# Patient Record
Sex: Female | Born: 1950
Health system: Southern US, Community
[De-identification: ages and names within clinical notes are randomized; demographics above are authoritative.]

## PROBLEM LIST (undated history)

## (undated) DIAGNOSIS — I509 Heart failure, unspecified: Secondary | ICD-10-CM

## (undated) DIAGNOSIS — F419 Anxiety disorder, unspecified: Secondary | ICD-10-CM

## (undated) DIAGNOSIS — T783XXA Angioneurotic edema, initial encounter: Secondary | ICD-10-CM

## (undated) DIAGNOSIS — H269 Unspecified cataract: Secondary | ICD-10-CM

## (undated) DIAGNOSIS — K219 Gastro-esophageal reflux disease without esophagitis: Secondary | ICD-10-CM

## (undated) DIAGNOSIS — I10 Essential (primary) hypertension: Secondary | ICD-10-CM

## (undated) DIAGNOSIS — E039 Hypothyroidism, unspecified: Secondary | ICD-10-CM

## (undated) DIAGNOSIS — E538 Deficiency of other specified B group vitamins: Secondary | ICD-10-CM

## (undated) DIAGNOSIS — I471 Supraventricular tachycardia, unspecified: Secondary | ICD-10-CM

## (undated) DIAGNOSIS — E785 Hyperlipidemia, unspecified: Secondary | ICD-10-CM

## (undated) DIAGNOSIS — R32 Unspecified urinary incontinence: Secondary | ICD-10-CM

## (undated) DIAGNOSIS — I5032 Chronic diastolic (congestive) heart failure: Secondary | ICD-10-CM

## (undated) HISTORY — DX: Essential (primary) hypertension: I10

## (undated) HISTORY — PX: VEIN LIGATION AND STRIPPING: SHX2653

## (undated) HISTORY — PX: LEEP: SHX91

## (undated) HISTORY — DX: Hypothyroidism, unspecified: E03.9

## (undated) HISTORY — DX: Hyperlipidemia, unspecified: E78.5

## (undated) HISTORY — DX: Unspecified cataract: H26.9

## (undated) HISTORY — PX: TUBAL LIGATION: SHX77

## (undated) HISTORY — DX: Gastro-esophageal reflux disease without esophagitis: K21.9

## (undated) HISTORY — PX: COLONOSCOPY: SHX174

## (undated) HISTORY — DX: Anxiety disorder, unspecified: F41.9

## (undated) HISTORY — DX: Angioneurotic edema, initial encounter: T78.3XXA

---

## 1997-09-18 ENCOUNTER — Other Ambulatory Visit: Admission: RE | Admit: 1997-09-18 | Discharge: 1997-09-18 | Payer: Self-pay | Admitting: Obstetrics and Gynecology

## 1997-11-27 ENCOUNTER — Other Ambulatory Visit: Admission: RE | Admit: 1997-11-27 | Discharge: 1997-11-27 | Payer: Self-pay | Admitting: *Deleted

## 1998-03-04 ENCOUNTER — Other Ambulatory Visit: Admission: RE | Admit: 1998-03-04 | Discharge: 1998-03-04 | Payer: Self-pay | Admitting: Obstetrics and Gynecology

## 1998-09-22 ENCOUNTER — Other Ambulatory Visit: Admission: RE | Admit: 1998-09-22 | Discharge: 1998-09-22 | Payer: Self-pay | Admitting: Obstetrics and Gynecology

## 1999-02-25 ENCOUNTER — Other Ambulatory Visit: Admission: RE | Admit: 1999-02-25 | Discharge: 1999-02-25 | Payer: Self-pay | Admitting: *Deleted

## 1999-03-24 ENCOUNTER — Other Ambulatory Visit: Admission: RE | Admit: 1999-03-24 | Discharge: 1999-03-24 | Payer: Self-pay | Admitting: *Deleted

## 1999-09-29 ENCOUNTER — Other Ambulatory Visit: Admission: RE | Admit: 1999-09-29 | Discharge: 1999-09-29 | Payer: Self-pay | Admitting: *Deleted

## 2000-04-04 ENCOUNTER — Other Ambulatory Visit: Admission: RE | Admit: 2000-04-04 | Discharge: 2000-04-04 | Payer: Self-pay | Admitting: *Deleted

## 2000-10-02 ENCOUNTER — Other Ambulatory Visit: Admission: RE | Admit: 2000-10-02 | Discharge: 2000-10-02 | Payer: Self-pay | Admitting: *Deleted

## 2001-02-13 ENCOUNTER — Ambulatory Visit (HOSPITAL_COMMUNITY): Admission: RE | Admit: 2001-02-13 | Discharge: 2001-02-13 | Payer: Self-pay | Admitting: Gastroenterology

## 2001-10-08 ENCOUNTER — Other Ambulatory Visit: Admission: RE | Admit: 2001-10-08 | Discharge: 2001-10-08 | Payer: Self-pay | Admitting: Obstetrics and Gynecology

## 2004-05-16 ENCOUNTER — Ambulatory Visit: Payer: Self-pay | Admitting: Family Medicine

## 2004-05-18 ENCOUNTER — Ambulatory Visit: Payer: Self-pay | Admitting: Family Medicine

## 2004-07-06 ENCOUNTER — Ambulatory Visit: Payer: Self-pay | Admitting: Family Medicine

## 2004-08-24 ENCOUNTER — Ambulatory Visit: Payer: Self-pay | Admitting: Family Medicine

## 2004-08-26 ENCOUNTER — Ambulatory Visit: Payer: Self-pay | Admitting: Family Medicine

## 2004-10-11 ENCOUNTER — Ambulatory Visit: Payer: Self-pay | Admitting: Family Medicine

## 2005-02-02 ENCOUNTER — Ambulatory Visit: Payer: Self-pay | Admitting: Family Medicine

## 2005-03-21 ENCOUNTER — Ambulatory Visit: Payer: Self-pay | Admitting: Family Medicine

## 2005-04-04 ENCOUNTER — Ambulatory Visit: Payer: Self-pay | Admitting: Family Medicine

## 2005-11-22 LAB — HM MAMMOGRAPHY

## 2005-12-14 ENCOUNTER — Ambulatory Visit: Payer: Self-pay | Admitting: Family Medicine

## 2006-02-22 ENCOUNTER — Encounter: Payer: Self-pay | Admitting: Family Medicine

## 2006-03-27 ENCOUNTER — Ambulatory Visit: Payer: Self-pay | Admitting: Family Medicine

## 2006-05-09 ENCOUNTER — Ambulatory Visit: Payer: Self-pay | Admitting: Family Medicine

## 2006-05-09 LAB — CONVERTED CEMR LAB
ALT: 16 units/L (ref 0–40)
AST: 28 units/L (ref 0–37)
BUN: 14 mg/dL (ref 6–23)
Calcium: 9.6 mg/dL (ref 8.4–10.5)
Chloride: 103 meq/L (ref 96–112)
Direct LDL: 63.9 mg/dL
GFR calc Af Amer: 74 mL/min
GFR calc non Af Amer: 61 mL/min
Glucose, Bld: 91 mg/dL (ref 70–99)
HDL: 64.3 mg/dL (ref >39.0)
Potassium: 4 meq/L (ref 3.5–5.1)
VLDL: 82 mg/dL — ABNORMAL HIGH (ref 0–40)

## 2006-06-05 ENCOUNTER — Ambulatory Visit: Payer: Self-pay | Admitting: Family Medicine

## 2006-06-05 LAB — CONVERTED CEMR LAB
ALT: 12 units/L (ref 0–40)
AST: 27 units/L (ref 0–37)
Direct LDL: 62 mg/dL
Triglycerides: 287 mg/dL (ref 0–149)

## 2006-08-15 ENCOUNTER — Ambulatory Visit: Payer: Self-pay | Admitting: Family Medicine

## 2006-08-15 LAB — CONVERTED CEMR LAB
Basophils Absolute: 0 10*3/uL (ref 0.0–0.1)
Basophils Relative: 0.5 % (ref 0.0–1.0)
HCT: 39.8 % (ref 36.0–46.0)
Hemoglobin: 13.8 g/dL (ref 12.0–15.0)
Monocytes Absolute: 0.5 10*3/uL (ref 0.2–0.7)
Neutrophils Relative %: 57.9 % (ref 43.0–77.0)
RBC: 4.11 M/uL (ref 3.87–5.11)
RDW: 12.3 % (ref 11.5–14.6)
WBC: 6.5 10*3/uL (ref 4.5–10.5)

## 2006-11-07 ENCOUNTER — Telehealth: Payer: Self-pay | Admitting: Family Medicine

## 2006-11-16 ENCOUNTER — Telehealth: Payer: Self-pay | Admitting: Family Medicine

## 2007-01-31 ENCOUNTER — Ambulatory Visit: Payer: Self-pay | Admitting: Family Medicine

## 2007-01-31 DIAGNOSIS — E785 Hyperlipidemia, unspecified: Secondary | ICD-10-CM | POA: Insufficient documentation

## 2007-01-31 DIAGNOSIS — I10 Essential (primary) hypertension: Secondary | ICD-10-CM | POA: Insufficient documentation

## 2007-01-31 DIAGNOSIS — E039 Hypothyroidism, unspecified: Secondary | ICD-10-CM | POA: Insufficient documentation

## 2007-01-31 DIAGNOSIS — F411 Generalized anxiety disorder: Secondary | ICD-10-CM | POA: Insufficient documentation

## 2007-01-31 DIAGNOSIS — K219 Gastro-esophageal reflux disease without esophagitis: Secondary | ICD-10-CM

## 2007-01-31 LAB — CONVERTED CEMR LAB
Glucose, Urine, Semiquant: NEGATIVE
Ketones, urine, test strip: NEGATIVE
Nitrite: NEGATIVE
Protein, U semiquant: 30
Specific Gravity, Urine: 1.02
pH: 6

## 2007-07-26 ENCOUNTER — Telehealth: Payer: Self-pay | Admitting: Family Medicine

## 2007-08-12 ENCOUNTER — Telehealth: Payer: Self-pay | Admitting: Family Medicine

## 2007-08-19 ENCOUNTER — Ambulatory Visit: Payer: Self-pay | Admitting: Family Medicine

## 2007-08-19 DIAGNOSIS — M2550 Pain in unspecified joint: Secondary | ICD-10-CM | POA: Insufficient documentation

## 2007-08-20 ENCOUNTER — Encounter: Payer: Self-pay | Admitting: Family Medicine

## 2007-08-20 LAB — CONVERTED CEMR LAB
ALT: 11 units/L (ref 0–35)
AST: 21 units/L (ref 0–37)
Basophils Absolute: 0 10*3/uL (ref 0.0–0.1)
Cholesterol: 406 mg/dL (ref 0–200)
HCT: 38.1 % (ref 36.0–46.0)
Hemoglobin: 13 g/dL (ref 12.0–15.0)
MCHC: 34.2 g/dL (ref 30.0–36.0)
Monocytes Absolute: 0.4 10*3/uL (ref 0.1–1.0)
Neutro Abs: 2.7 10*3/uL (ref 1.4–7.7)
Platelets: 233 10*3/uL (ref 150–400)
RDW: 12.9 % (ref 11.5–14.6)
Total CHOL/HDL Ratio: 9.6
Triglycerides: 394 mg/dL (ref 0–149)
VLDL: 79 mg/dL — ABNORMAL HIGH (ref 0–40)

## 2007-09-02 ENCOUNTER — Encounter: Payer: Self-pay | Admitting: Family Medicine

## 2007-09-26 ENCOUNTER — Ambulatory Visit: Payer: Self-pay | Admitting: Family Medicine

## 2007-09-30 LAB — CONVERTED CEMR LAB
ALT: 14 units/L (ref 0–35)
AST: 22 units/L (ref 0–37)
HDL: 44.4 mg/dL (ref >39.0)
Total CHOL/HDL Ratio: 6.7
Triglycerides: 320 mg/dL (ref 0–149)
VLDL: 64 mg/dL — ABNORMAL HIGH (ref 0–40)

## 2007-10-29 ENCOUNTER — Encounter: Payer: Self-pay | Admitting: Family Medicine

## 2007-10-31 ENCOUNTER — Encounter: Payer: Self-pay | Admitting: Family Medicine

## 2007-11-20 ENCOUNTER — Encounter: Payer: Self-pay | Admitting: Family Medicine

## 2007-11-29 ENCOUNTER — Ambulatory Visit: Payer: Self-pay | Admitting: Family Medicine

## 2007-12-03 LAB — CONVERTED CEMR LAB
ALT: 16 units/L (ref 0–35)
AST: 23 units/L (ref 0–37)
Direct LDL: 118 mg/dL
Total CHOL/HDL Ratio: 6.8

## 2007-12-09 ENCOUNTER — Telehealth (INDEPENDENT_AMBULATORY_CARE_PROVIDER_SITE_OTHER): Payer: Self-pay | Admitting: *Deleted

## 2007-12-17 ENCOUNTER — Telehealth (INDEPENDENT_AMBULATORY_CARE_PROVIDER_SITE_OTHER): Payer: Self-pay | Admitting: *Deleted

## 2008-01-16 ENCOUNTER — Telehealth (INDEPENDENT_AMBULATORY_CARE_PROVIDER_SITE_OTHER): Payer: Self-pay | Admitting: *Deleted

## 2008-03-02 ENCOUNTER — Ambulatory Visit: Payer: Self-pay | Admitting: Family Medicine

## 2008-03-03 LAB — CONVERTED CEMR LAB
ALT: 15 units/L (ref 0–35)
BUN: 10 mg/dL (ref 6–23)
CO2: 31 meq/L (ref 19–32)
Chloride: 102 meq/L (ref 96–112)
Cholesterol: 218 mg/dL (ref 0–200)
Creatinine, Ser: 1 mg/dL (ref 0.4–1.2)
Direct LDL: 81.1 mg/dL
GFR calc Af Amer: 73 mL/min
Glucose, Bld: 93 mg/dL (ref 70–99)
TSH: 3.41 microintl units/mL (ref 0.35–5.50)
Total CHOL/HDL Ratio: 5.2
Triglycerides: 209 mg/dL (ref 0–149)

## 2008-05-19 ENCOUNTER — Telehealth: Payer: Self-pay | Admitting: Family Medicine

## 2009-01-26 ENCOUNTER — Telehealth: Payer: Self-pay | Admitting: Family Medicine

## 2009-03-05 ENCOUNTER — Ambulatory Visit: Payer: Self-pay | Admitting: Family Medicine

## 2009-03-08 LAB — CONVERTED CEMR LAB
ALT: 13 units/L (ref 0–35)
AST: 26 units/L (ref 0–37)
Direct LDL: 95 mg/dL
Triglycerides: 268 mg/dL — ABNORMAL HIGH (ref 0.0–149.0)

## 2009-03-17 ENCOUNTER — Telehealth: Payer: Self-pay | Admitting: Family Medicine

## 2009-04-01 ENCOUNTER — Telehealth: Payer: Self-pay | Admitting: Family Medicine

## 2009-04-28 ENCOUNTER — Telehealth: Payer: Self-pay | Admitting: Family Medicine

## 2009-05-05 ENCOUNTER — Ambulatory Visit: Payer: Self-pay | Admitting: Family Medicine

## 2010-01-05 ENCOUNTER — Telehealth: Payer: Self-pay | Admitting: Family Medicine

## 2010-05-24 NOTE — Progress Notes (Signed)
Summary: Rx's to be mailed away  Phone Note Refill Request Message from:  Patient on April 28, 2009 11:38 AM  Refills Requested: Medication #1:  PROTONIX 40 MG  TBEC 1 by mouth daily  Medication #2:  COZAAR 100 MG  TABS 1 by mouth daily pt has appt scheduled on 05/05/2009 and is asking for 90 day supply to be mailed away.    Method Requested: Pick up at Office Initial call taken by: Mervin Hack CMA Duncan Dull),  April 28, 2009 11:39 AM  Follow-up for Phone Call        printed in put in nurse in box for pickup  Follow-up by: [name] MD,  April 28, 2009 1:07 PM  Additional Follow-up for Phone Call Additional follow up Details #1::        Pt to pick up. Additional Follow-up by: Lowella Petties CMA,  April 28, 2009 2:47 PM    Prescriptions: COZAAR 100 MG  TABS (LOSARTAN POTASSIUM) 1 by mouth daily  #90 x 1   Entered and Authorized by:   [name] MD   Signed by:   [name] MD on 04/28/2009   Method used:   Print then Give to Patient   RxID:   [id number] PROTONIX 40 MG  TBEC (PANTOPRAZOLE SODIUM) 1 by mouth daily  #90 x 1   Entered and Authorized by:   [name] MD   Signed by:   [name] MD on 04/28/2009   Method used:   Print then Give to Patient   RxID:   [id number]

## 2010-05-24 NOTE — Assessment & Plan Note (Signed)
Summary: FOLLOW UP   Vital Signs:  Patient profile:   60 year old female Weight:      203 pounds Temp:     98 degrees F oral Pulse rate:   80 / minute Pulse rhythm:   regular BP sitting:   124 / 78  (left arm) Cuff size:   regular  Vitals Entered By: Lowella Petties CMA (May 05, 2009 11:18 AM) CC: follow-up visit   History of Present Illness: here for f/u of hyperlipidemia and hypothyroidism  has been doing fine in general   has been to ortho and smoc-- saw Dr Jon Billings unsure if she has fibromyalgia -- but no problems since getting off lipitor (thankful for that)  thinks she has a little asthma  something in her MILs house made her wheeze and sob -- ? what chemical aggrivated it    no recent change in med   did change to zocor due to myalgias with lipitor   recent chol up a bit with trig 268/ HDL47 and LDL 95  is watching diet - no fried food and has cut way back on red meat  is struggling with weight gain (very stressful situation- lost M and MIL)- no time for herself  [facility]es [facility] -- and enjoys that - every other day / walks on the treadmill/ will start back with weight   tsh is stable and theraputic  pt has gained 17 lb since last visit   anxiety - is no problem now   bp is in good control with 124/78 today- no problems or headaches   is being treated for vit D def by her gyn -- 15 - high dose weekly for a while now on citrucal with vit D  has had dexa in the past -- osteopenia first one - then improved    Allergies: 1)  ! Lipitor (Atorvastatin) 2)  Neosporin 3)  Norvasc  Past History:  Family History: Last updated: 02/14/2007 Father: deceased age 87- cerebral thrombosis secondary to arterosclerosis, HTN Mother: TIA's, HTN Siblings:   Social History: Last updated: 02-14-2007 Marital Status: Married Children: 2 Occupation: realtor  Risk Factors: Smoking Status: quit (02/14/2007)  Past Medical  History: Anxiety GERD Hyperlipidemia Hypertension Hypothyroidism vit D def   myalgias/arthralgias -- ? fibromyalgia (but symptoms resolved off of lipitor)   rheum- Devishwar  Past Surgical History: Vein stripping x 2 AB Tubal ligation Colonoscopy- diverticulosis (2002) LEEP Dexa- osteopenia (2007)-- f/u one was normal   Review of Systems General:  Complains of fatigue; denies fever, loss of appetite, and malaise. Eyes:  Denies blurring and eye irritation. CV:  Denies chest pain or discomfort, lightheadness, palpitations, and shortness of breath with exertion. Resp:  Denies cough and wheezing. GI:  Denies abdominal pain, change in bowel habits, and indigestion. MS:  Denies joint pain, joint redness, and joint swelling. Derm:  Denies lesion(s), poor wound healing, and rash. Neuro:  Denies numbness, tingling, and tremors. Psych:  mood is much better . Endo:  Denies cold intolerance, excessive thirst, excessive urination, and heat intolerance.  Physical Exam  General:  overweight but generally well appearing  Head:  normocephalic, atraumatic, and no abnormalities observed.   Eyes:  vision grossly intact, pupils equal, pupils round, and pupils reactive to light.   Mouth:  pharynx pink and moist.   Neck:  supple with full rom and no masses or thyromegally, no JVD or carotid bruit  Lungs:  Normal respiratory effort, chest expands symmetrically. Lungs are clear to  auscultation, no crackles or wheezes. Heart:  Normal rate and regular rhythm. S1 and S2 normal without gallop, murmur, click, rub or other extra sounds. Abdomen:  soft and non-tender.  no renal  bruits  Msk:  No deformity or scoliosis noted of thoracic or lumbar spine.  no acute joint changes or trigger points  Pulses:  R and L carotid,radial,femoral,dorsalis pedis and posterior tibial pulses are full and equal bilaterally Extremities:  no CCE today Neurologic:  sensation intact to light touch, gait normal, and DTRs  symmetrical and normal.  no tremor Skin:  Intact without suspicious lesions or rashes Cervical Nodes:  No lymphadenopathy noted Psych:  normal affect, talkative and pleasant    Impression & Recommendations:  Problem # 1:  HYPOTHYROIDISM (ICD-244.9) Assessment Unchanged  no clinical changes and stable tsh no change in dose - rev lab with pt  Her updated medication list for this problem includes:    Synthroid 50 Mcg Tabs (Levothyroxine sodium) .Marland Kitchen... 1 by mouth daily  Labs Reviewed: TSH: 2.69 (03/05/2009)    Chol: 251 (03/05/2009)   HDL: 47.20 (03/05/2009)   LDL: DEL (03/02/2008)   TG: 268.0 (03/05/2009)  Problem # 2:  HYPERTENSION (ICD-401.9) Assessment: Unchanged  bp remains in very good control with current meds  no change rec  rev healthy diet and exercise - will consider wt watchers for wt loss  urged to keep up the good exercise  Her updated medication list for this problem includes:    Cozaar 100 Mg Tabs (Losartan potassium) .Marland Kitchen... 1 by mouth daily    Hydrochlorothiazide 50 Mg Tabs (Hydrochlorothiazide) .Marland Kitchen... 1 by mouth daily  BP today: 124/78 Prior BP: 110/80 (08/19/2007)  Labs Reviewed: K+: 3.9 (03/02/2008) Creat: : 1.0 (03/02/2008)   Chol: 251 (03/05/2009)   HDL: 47.20 (03/05/2009)   LDL: DEL (03/02/2008)   TG: 268.0 (03/05/2009)  Problem # 3:  HYPERLIPIDEMIA (ICD-272.4) Assessment: Unchanged  cholesterol is up a bit on zocor- but this is much better tolerated disc low sat fat diet in detail  will re check this in 6 mo and f/u in a year  Her updated medication list for this problem includes:    Zocor 20 Mg Tabs (Simvastatin) .Marland Kitchen... Take one by mouth daily  Labs Reviewed: SGOT: 26 (03/05/2009)   SGPT: 13 (03/05/2009)   HDL:47.20 (03/05/2009), 41.6 (03/02/2008)  LDL:DEL (03/02/2008), DEL (11/29/2007)  Chol:251 (03/05/2009), 218 (03/02/2008)  Trig:268.0 (03/05/2009), 209 (03/02/2008)  Problem # 4:  ANXIETY (ICD-300.00) Assessment: Improved overall doing much  better on current meds  some dec in sit stress (is out of real estate buisness0 - but has had some family loss  refil meds enc good exercise  Her updated medication list for this problem includes:    Wellbutrin Xl 150 Mg Tb24 (Bupropion hcl) .Marland Kitchen... 1 by mouth each am    Zoloft 100 Mg Tabs (Sertraline hcl) .Marland Kitchen... Take 2 by mouth daily  Complete Medication List: 1)  Protonix 40 Mg Tbec (Pantoprazole sodium) .Marland Kitchen.. 1 by mouth daily 2)  Wellbutrin Xl 150 Mg Tb24 (Bupropion hcl) .Marland Kitchen.. 1 by mouth each am 3)  Zoloft 100 Mg Tabs (Sertraline hcl) .... Take 2 by mouth daily 4)  Synthroid 50 Mcg Tabs (Levothyroxine sodium) .Marland Kitchen.. 1 by mouth daily 5)  Cozaar 100 Mg Tabs (Losartan potassium) .Marland Kitchen.. 1 by mouth daily 6)  Hydrochlorothiazide 50 Mg Tabs (Hydrochlorothiazide) .Marland Kitchen.. 1 by mouth daily 7)  Zocor 20 Mg Tabs (Simvastatin) .... Take one by mouth daily 8)  Estrace 0.1 Mg/gm Crea (  Estradiol) .... Use 2 times a week 9)  Folic Acid 1 Mg Tabs (Folic acid) .... Take one by mouth daily 10)  Aspirin 81 Mg Tabs (Aspirin) .... Take one by mouth daily 11)  Citracal Plus Tabs (Multiple minerals-vitamins) .... Take by mouth as directed  Patient Instructions: 1)  no change in medicine  2)  schedule fasting labs in 6 months lipid/ast/alt/renal 401.1, 272  3)  follow up with me in about a year  4)  keep working on healthy diet and exercise  Prescriptions: ZOCOR 20 MG  TABS (SIMVASTATIN) take one by mouth daily  #90 x 3   Entered and Authorized by:   [name] MD   Signed by:   [name] MD on 05/05/2009   Method used:   Print then Give to Patient   RxID:   [id number] HYDROCHLOROTHIAZIDE 50 MG  TABS (HYDROCHLOROTHIAZIDE) 1 by mouth daily  #90 x 3   Entered and Authorized by:   [name] MD   Signed by:   [name] MD on 05/05/2009   Method used:   Print then Give to Patient   RxID:   [id number] COZAAR 100 MG  TABS (LOSARTAN POTASSIUM) 1 by mouth daily  #90 x 3   Entered and  Authorized by:   [name] MD   Signed by:   [name] MD on 05/05/2009   Method used:   Print then Give to Patient   RxID:   [id number] SYNTHROID 50 MCG  TABS (LEVOTHYROXINE SODIUM) 1 by mouth daily  #90 x 3   Entered and Authorized by:   [name] MD   Signed by:   [name] MD on 05/05/2009   Method used:   Print then Give to Patient   RxID:   [id number] ZOLOFT 100 MG  TABS (SERTRALINE HCL) take 2 by mouth daily  #180 x 3   Entered and Authorized by:   [name] MD   Signed by:   [name] MD on 05/05/2009   Method used:   Print then Give to Patient   RxID:   [id number] WELLBUTRIN XL 150 MG  TB24 (BUPROPION HCL) 1 by mouth each am  #90 x 3   Entered and Authorized by:   [name] MD   Signed by:   [name] MD on 05/05/2009   Method used:   Print then Give to Patient   RxID:   [id number] PROTONIX 40 MG  TBEC (PANTOPRAZOLE SODIUM) 1 by mouth daily  #90 x 3   Entered and Authorized by:   [name] MD   Signed by:   [name] MD on 05/05/2009   Method used:   Print then Give to Patient   RxID:   [id number]   Prior Medications (reviewed today): PROTONIX 40 MG  TBEC (PANTOPRAZOLE SODIUM) 1 by mouth daily WELLBUTRIN XL 150 MG  TB24 (BUPROPION HCL) 1 by mouth each am ZOLOFT 100 MG  TABS (SERTRALINE HCL) take 2 by mouth daily SYNTHROID 50 MCG  TABS (LEVOTHYROXINE SODIUM) 1 by mouth daily COZAAR 100 MG  TABS (LOSARTAN POTASSIUM) 1 by mouth daily ESTRACE 0.1 MG/GM CREA (ESTRADIOL) use 2 times a week FOLIC ACID 1 MG TABS (FOLIC ACID) take one by mouth daily ASPIRIN 81 MG TABS (ASPIRIN) take one by mouth daily CITRACAL PLUS  TABS (MULTIPLE MINERALS-VITAMINS) take by mouth as directed Current Allergies (reviewed today): ! LIPITOR (  ATORVASTATIN) NEOSPORIN NORVASC

## 2010-05-24 NOTE — Progress Notes (Signed)
Summary: needs written scripts for mail order  Phone Note Call from Patient Call back at [phone]   Caller: Patient Call For: Sharon Part MD Summary of Call: Pt is changing mail order pharmacies and needs 3 month written scripts for pantoprazole, sertraline, wellbutrin, zocor, losartan, hctz, levothyroxine, estrace.  Please call when ready. Initial call taken by: Lowella Petties CMA,  January 05, 2010 12:13 PM  Follow-up for Phone Call        printed in put in nurse in box for pickup  Follow-up by: Sharon Part MD,  January 05, 2010 1:21 PM  Additional Follow-up for Phone Call Additional follow up Details #1::        Patient notified as instructed by telephone. Prescription left at front desk. Lewanda Rife LPN  January 05, 2010 2:30 PM     New/Updated Medications: PROTONIX 40 MG  TBEC (PANTOPRAZOLE SODIUM) 1 by mouth daily WELLBUTRIN XL 150 MG  TB24 (BUPROPION HCL) 1 by mouth each am ZOLOFT 100 MG  TABS (SERTRALINE HCL) take 2 by mouth daily SYNTHROID 50 MCG  TABS (LEVOTHYROXINE SODIUM) 1 by mouth daily HYDROCHLOROTHIAZIDE 50 MG  TABS (HYDROCHLOROTHIAZIDE) 1 by mouth daily ZOCOR 20 MG  TABS (SIMVASTATIN) take one by mouth daily ESTRACE 0.1 MG/GM CREA (ESTRADIOL) use 2 times a week as directed (about 2 cm in applicator) Prescriptions: ESTRACE 0.1 MG/GM CREA (ESTRADIOL) use 2 times a week as directed (about 2 cm in applicator)  #3 months x 3   Entered and Authorized by:   Sharon Part MD   Signed by:   Sharon Part MD on 01/05/2010   Method used:   Print then Give to Patient   RxID:   [id number] ZOCOR 20 MG  TABS (SIMVASTATIN) take one by mouth daily  #90 x 3   Entered and Authorized by:   Sharon Part MD   Signed by:   Sharon Part MD on 01/05/2010   Method used:   Print then Give to Patient   RxID:   [id number] HYDROCHLOROTHIAZIDE 50 MG  TABS (HYDROCHLOROTHIAZIDE) 1 by mouth daily  #90 x 3   Entered and Authorized by:   Sharon Part MD  Signed by:   Sharon Part MD on 01/05/2010   Method used:   Print then Give to Patient   RxID:   [id number] COZAAR 100 MG  TABS (LOSARTAN POTASSIUM) 1 by mouth daily  #90 x 3   Entered and Authorized by:   Sharon Part MD   Signed by:   Sharon Part MD on 01/05/2010   Method used:   Print then Give to Patient   RxID:   [id number] SYNTHROID 50 MCG  TABS (LEVOTHYROXINE SODIUM) 1 by mouth daily  #90 x 3   Entered and Authorized by:   Sharon Part MD   Signed by:   Sharon Part MD on 01/05/2010   Method used:   Print then Give to Patient   RxID:   [id number] ZOLOFT 100 MG  TABS (SERTRALINE HCL) take 2 by mouth daily  #180 x 3   Entered and Authorized by:   Sharon Part MD   Signed by:   Sharon Part MD on 01/05/2010   Method used:   Print then Give to Patient   RxID:   [id number] [facility] XL 150 MG  TB24 (BUPROPION HCL) 1 by mouth each am  #90 x 3   Entered and Authorized by:  Sharon Part MD   Signed by:   Sharon Part MD on 01/05/2010   Method used:   Print then Give to Patient   RxID:   [id number] PROTONIX 40 MG  TBEC (PANTOPRAZOLE SODIUM) 1 by mouth daily  #90 x 3   Entered and Authorized by:   Sharon Part MD   Signed by:   Sharon Part MD on 01/05/2010   Method used:   Print then Give to Patient   RxID:   [id number]

## 2010-09-09 NOTE — Procedures (Signed)
[facility]  Patient:    Sharon [name], Sharon [name] Visit Number: [id number] MRN: [id number]          Service Type: END Location: ENDO Attending Physician:  Charna Elizabeth Dictated by:   Anselmo Rod, M.D. Proc. Date: 02/13/01 Admit Date:  02/13/2001   CC:         Cordelia Pen A. Rosalio Macadamia, M.D.   Procedure Report  DATE OF BIRTH:  1950/12/16.  PROCEDURE:  Colonoscopy.  ENDOSCOPIST:  Anselmo Rod, M.D.  INSTRUMENT USED:  Olympus video colonoscope.  INDICATION FOR PROCEDURE:  Rectal bleeding in a 60 year old white female. Rule out colonic polyps, masses, hemorrhoids, etc.  PREPROCEDURE PREPARATION:  Informed consent was procured from the patient. The patient was fasted for eight hours prior to the procedure and prepped with a bottle of magnesium citrate and a gallon of NuLytely the night prior to the procedure.  The patient was also given 1 g of Ancef for a history of mitral valve prolapse to cover prophylaxis for endocarditis.  PREPROCEDURE PHYSICAL:  VITAL SIGNS:  The patient had stable vital signs.  NECK:  Supple.  CHEST:  Clear to auscultation.  S1, S2 regular.  ABDOMEN:  Soft with normal bowel sounds.  DESCRIPTION OF PROCEDURE:  The patient was placed in the left lateral decubitus position and sedated with 80 mg of Demerol and 8 mg of Versed intravenously.  Once the patient was adequately sedate and maintained on low-flow oxygen and continuous cardiac monitoring, the Olympus video colonoscope was advanced from the rectum to the cecum without difficulty. Except for small internal hemorrhoids seen on retroflexion and a few scattered diverticula, no other abnormalities were seen.  No masses, polyps, erosions, or ulcerations were identified.  IMPRESSION: 1. A healthy-appearing colon except for a few scattered diverticula. 2. Small, nonbleeding internal hemorrhoids seen on retroflexion.  RECOMMENDATIONS: 1. A high-fiber diet has  been recommended for the patient. 2. Outpatient follow-up is advised as need arises. Dictated by:   Anselmo Rod, M.D. Attending Physician:  Charna Elizabeth DD:  02/13/01 TD:  02/15/01 Job: [id number]

## 2010-12-25 ENCOUNTER — Other Ambulatory Visit: Payer: Self-pay | Admitting: Family Medicine

## 2010-12-28 ENCOUNTER — Other Ambulatory Visit (INDEPENDENT_AMBULATORY_CARE_PROVIDER_SITE_OTHER): Payer: BC Managed Care – PPO

## 2010-12-28 ENCOUNTER — Telehealth: Payer: Self-pay | Admitting: Radiology

## 2010-12-28 DIAGNOSIS — E7800 Pure hypercholesterolemia, unspecified: Secondary | ICD-10-CM

## 2010-12-28 DIAGNOSIS — I10 Essential (primary) hypertension: Secondary | ICD-10-CM

## 2010-12-28 DIAGNOSIS — E559 Vitamin D deficiency, unspecified: Secondary | ICD-10-CM | POA: Insufficient documentation

## 2010-12-28 LAB — LIPID PANEL
HDL: 51.3 mg/dL (ref >39.00)
Triglycerides: 361 mg/dL — ABNORMAL HIGH (ref 0.0–149.0)

## 2010-12-28 LAB — AST: AST: 22 U/L (ref 0–37)

## 2010-12-28 LAB — RENAL FUNCTION PANEL
Albumin: 4.3 g/dL (ref 3.5–5.2)
Calcium: 9.7 mg/dL (ref 8.4–10.5)
Chloride: 100 mEq/L (ref 96–112)
Phosphorus: 3.7 mg/dL (ref 2.3–4.6)
Potassium: 3.6 mEq/L (ref 3.5–5.1)

## 2010-12-28 LAB — LDL CHOLESTEROL, DIRECT: Direct LDL: 107.4 mg/dL

## 2010-12-28 NOTE — Telephone Encounter (Signed)
Patient wanted a vit D with today's labs. Her GYN follows, but she wanted it drawn today. Vit D deficiency.Marland Kitchen

## 2010-12-28 NOTE — Telephone Encounter (Signed)
Order done for today

## 2011-01-02 ENCOUNTER — Encounter: Payer: Self-pay | Admitting: Family Medicine

## 2011-01-03 ENCOUNTER — Ambulatory Visit (INDEPENDENT_AMBULATORY_CARE_PROVIDER_SITE_OTHER): Payer: BC Managed Care – PPO | Admitting: Family Medicine

## 2011-01-03 ENCOUNTER — Encounter: Payer: Self-pay | Admitting: Family Medicine

## 2011-01-03 DIAGNOSIS — E559 Vitamin D deficiency, unspecified: Secondary | ICD-10-CM

## 2011-01-03 DIAGNOSIS — E785 Hyperlipidemia, unspecified: Secondary | ICD-10-CM

## 2011-01-03 DIAGNOSIS — E039 Hypothyroidism, unspecified: Secondary | ICD-10-CM

## 2011-01-03 DIAGNOSIS — I10 Essential (primary) hypertension: Secondary | ICD-10-CM

## 2011-01-03 MED ORDER — BUPROPION HCL ER (XL) 150 MG PO TB24: 150.0000 mg | ORAL_TABLET | Freq: Every day | ORAL | Status: DC

## 2011-01-03 MED ORDER — LOSARTAN POTASSIUM 100 MG PO TABS: 100.0000 mg | ORAL_TABLET | Freq: Every day | ORAL | Status: DC

## 2011-01-03 MED ORDER — HYDROCHLOROTHIAZIDE 50 MG PO TABS: 50.0000 mg | ORAL_TABLET | Freq: Every day | ORAL | Status: DC

## 2011-01-03 MED ORDER — SERTRALINE HCL 100 MG PO TABS: 200.0000 mg | ORAL_TABLET | Freq: Every day | ORAL | Status: DC

## 2011-01-03 MED ORDER — PANTOPRAZOLE SODIUM 40 MG PO TBEC: 40.0000 mg | DELAYED_RELEASE_TABLET | Freq: Every day | ORAL | Status: DC

## 2011-01-03 MED ORDER — LEVOTHYROXINE SODIUM 50 MCG PO TABS: 50.0000 ug | ORAL_TABLET | Freq: Every day | ORAL | Status: DC

## 2011-01-03 MED ORDER — SIMVASTATIN 20 MG PO TABS: 20.0000 mg | ORAL_TABLET | Freq: Every day | ORAL | Status: DC

## 2011-01-03 NOTE — Assessment & Plan Note (Signed)
Well controlled with arb and hctz No changes  refils done Disc plan to exercise 5 d per week

## 2011-01-03 NOTE — Patient Instructions (Signed)
Avoid red meat/ fried foods/ egg yolks/ fatty breakfast meats/ butter, cheese and high fat dairy/ and shellfish   Aim for exercise 5 days per week at least 2-30 minutes  Schedule fasting lab and then follow up and then follow up  Take your citrical twice daily

## 2011-01-03 NOTE — Assessment & Plan Note (Signed)
No clinical changes  tsh next draw

## 2011-01-03 NOTE — Assessment & Plan Note (Signed)
This is worse with higher sat fat diet and wt gain  Disc low sat fat diet in detail Rev labs with pt  Will work harder If not imp in 3 mo - may need to change med from zocor to another statin

## 2011-01-03 NOTE — Assessment & Plan Note (Signed)
Pt had to have high dose tx from her gyn in past Level in 30s Recommended inc citrical to bid  Will continue to monitor Also disc imp of exercise to bone health

## 2011-01-03 NOTE — Progress Notes (Signed)
Subjective:    Patient ID: Sharon [name], female    DOB: 1951-01-04, 60 y.o.   MRN: [id number]  HPI Here for f/u of HTN and lipids and hypothyroid and vit D def Is doing fine   Gets a bit dizzy when she turns over in bed  Not bad and not during the day at all  Is brief - a bit of vertigo No headaches/numbness/ or vision change   L ankle stays more swollen posteriorly than the other  ? If she ever had an injury there    HTN in good control 110/74 No cp or sob or edema  On cozaar and hct  Labs rev  Lipids are up - esp in trig Lab Results  Component Value Date   CHOL 291* 12/28/2010   CHOL 251* 03/05/2009   CHOL 218* 03/02/2008   Lab Results  Component Value Date   HDL 51.30 12/28/2010   HDL 47.20 03/05/2009   HDL 41.6 03/02/2008   Lab Results  Component Value Date   LDLCALC 86 05/09/2006   Lab Results  Component Value Date   TRIG 361.0* 12/28/2010   TRIG 268.0* 03/05/2009   TRIG 209* 03/02/2008   Lab Results  Component Value Date   CHOLHDL 6 12/28/2010   CHOLHDL 5 03/05/2009   CHOLHDL 5.2 CALC 03/02/2008   Lab Results  Component Value Date   LDLDIRECT 107.4 12/28/2010   LDLDIRECT 95.0 03/05/2009   LDLDIRECT 81.1 03/02/2008     Diet-- not great , not eating well at all  Too many servings of ice cream Gave up cheeseburgers  No eggs and no fried foods   Is getting some exercise - [facility]    Hypothyroid Lab Results  Component Value Date   TSH 2.69 03/05/2009   no change in skin or hair or energy level  Feels about the same  Vit D level is 35 Is taking some calcium and D for bones - citrical  Her gyn follows that as well    Wt is up 5 lb with bmi of 31   Patient Active Problem List  Diagnoses  . HYPOTHYROIDISM  . HYPERLIPIDEMIA  . ANXIETY  . HYPERTENSION  . GERD  . ARTHRALGIA  . Vitamin D deficiency   Past Medical History  Diagnosis Date  . Anxiety   . GERD (gastroesophageal reflux disease)   . Hyperlipidemia    . Hypertension   . Hypothyroidism   . Vitamin D deficiency    Past Surgical History  Procedure Date  . Vein ligation and stripping     x2  . Tubal ligation   . Leep    History  Substance Use Topics  . Smoking status: Former Smoker    Quit date: 04/24/1996  . Smokeless tobacco: Not on file  . Alcohol Use: Not on file   Family History  Problem Relation Age of Onset  . Transient ischemic attack Mother   . Hypertension Mother   . Hypertension Father    Allergies  Allergen Reactions  . Amlodipine Besylate     REACTION: edema  . Atorvastatin     REACTION: muscle pain  . Triple Antibiotic     REACTION: rash   Current Outpatient Prescriptions on File Prior to Visit  Medication Sig Dispense Refill  . estradiol (ESTRACE) 0.1 MG/GM vaginal cream Use 2 times a week as directed (about 2 cm in applicator).       . Multiple Minerals-Vitamins (  CITRACAL PLUS PO) Take 1 tablet by mouth daily.       Marland Kitchen aspirin 81 MG tablet Take 81 mg by mouth daily.        . folic acid (FOLVITE) 1 MG tablet Take 1 mg by mouth daily.              Review of Systems Review of Systems  Constitutional: Negative for fever, appetite change, fatigue and unexpected weight change.  Eyes: Negative for pain and visual disturbance.  Respiratory: Negative for cough and shortness of breath.   Cardiovascular: Negative for cp or palpitations   occ ankle edema  Gastrointestinal: Negative for nausea, diarrhea and constipation.  Genitourinary: Negative for urgency and frequency.  Skin: Negative for pallor or rash   Neurological: Negative for weakness, light-headedness, numbness and headaches.  Hematological: Negative for adenopathy. Does not bruise/bleed easily.  Psychiatric/Behavioral: Negative for dysphoric mood. The patient is not nervous/anxious.          Objective:   Physical Exam  Constitutional: She appears well-developed and well-nourished. No distress.       overwt and well appearing   HENT:    Head: Normocephalic and atraumatic.  Mouth/Throat: Oropharynx is clear and moist.  Eyes: Conjunctivae and EOM are normal. Pupils are equal, round, and reactive to light.  Neck: Normal range of motion. Neck supple. No JVD present. Carotid bruit is not present. No thyromegaly present.  Cardiovascular: Normal rate, regular rhythm, normal heart sounds and intact distal pulses.   Pulmonary/Chest: Effort normal and breath sounds normal. No respiratory distress. She has no wheezes.  Abdominal: Soft. Bowel sounds are normal. She exhibits no distension, no abdominal bruit and no mass. There is no tenderness.  Musculoskeletal: Normal range of motion. She exhibits no edema and no tenderness.  Lymphadenopathy:    She has no cervical adenopathy.  Neurological: She is alert. She has normal reflexes. No cranial nerve deficit. Coordination normal.  Skin: Skin is warm and dry. No rash noted. No erythema. No pallor.  Psychiatric: She has a normal mood and affect.          Assessment & Plan:

## 2011-03-09 ENCOUNTER — Other Ambulatory Visit: Payer: Self-pay | Admitting: Family Medicine

## 2011-03-09 NOTE — Telephone Encounter (Signed)
Patient notified as instructed by telephone. [facility] will have [facility] contact [facility] for refills.

## 2011-03-09 NOTE — Telephone Encounter (Signed)
Left v/m for pt to call back. The three rx pt is requesting sent to [facility] has been filled at [facility] year and [facility] said [facility] can request refills to be transferred.

## 2011-03-10 ENCOUNTER — Other Ambulatory Visit: Payer: Self-pay | Admitting: *Deleted

## 2011-03-10 MED ORDER — PANTOPRAZOLE SODIUM 40 MG PO TBEC: 40.0000 mg | DELAYED_RELEASE_TABLET | Freq: Every day | ORAL | Status: DC

## 2011-03-10 MED ORDER — SERTRALINE HCL 100 MG PO TABS: 200.0000 mg | ORAL_TABLET | Freq: Every day | ORAL | Status: DC

## 2011-03-10 MED ORDER — BUPROPION HCL ER (XL) 150 MG PO TB24: 150.0000 mg | ORAL_TABLET | Freq: Every day | ORAL | Status: DC

## 2011-03-10 NOTE — Telephone Encounter (Signed)
I will electronically [facility]

## 2011-03-10 NOTE — Telephone Encounter (Signed)
Pt called,  there has been some confusion over pt's refills.  She was given written scripts at her office visit in September, [facility], but they are telling her they never got these.  She had a script for sertraline on hold at [facility], which I just cancelled because she wants [facility].

## 2011-03-13 NOTE — Telephone Encounter (Signed)
Left vm for pt to callback 

## 2011-03-13 NOTE — Telephone Encounter (Signed)
Patient notified as instructed by telephone. 

## 2011-03-29 ENCOUNTER — Other Ambulatory Visit (INDEPENDENT_AMBULATORY_CARE_PROVIDER_SITE_OTHER): Payer: BC Managed Care – PPO

## 2011-03-29 DIAGNOSIS — E785 Hyperlipidemia, unspecified: Secondary | ICD-10-CM

## 2011-03-29 DIAGNOSIS — I10 Essential (primary) hypertension: Secondary | ICD-10-CM

## 2011-03-29 DIAGNOSIS — E039 Hypothyroidism, unspecified: Secondary | ICD-10-CM

## 2011-03-29 LAB — TSH: TSH: 2.79 u[IU]/mL (ref 0.35–5.50)

## 2011-03-29 LAB — AST: AST: 22 U/L (ref 0–37)

## 2011-03-29 LAB — LIPID PANEL: Triglycerides: 234 mg/dL — ABNORMAL HIGH (ref 0.0–149.0)

## 2011-04-04 ENCOUNTER — Ambulatory Visit: Payer: BC Managed Care – PPO | Admitting: Family Medicine

## 2011-04-10 ENCOUNTER — Encounter: Payer: Self-pay | Admitting: Family Medicine

## 2011-04-10 ENCOUNTER — Ambulatory Visit (INDEPENDENT_AMBULATORY_CARE_PROVIDER_SITE_OTHER): Payer: BC Managed Care – PPO | Admitting: Family Medicine

## 2011-04-10 VITALS — BP 116/72 | HR 72 | Temp 98.2°F | Ht 66.5 in | Wt 198.5 lb

## 2011-04-10 DIAGNOSIS — E785 Hyperlipidemia, unspecified: Secondary | ICD-10-CM

## 2011-04-10 DIAGNOSIS — J3489 Other specified disorders of nose and nasal sinuses: Secondary | ICD-10-CM

## 2011-04-10 DIAGNOSIS — R0981 Nasal congestion: Secondary | ICD-10-CM

## 2011-04-10 DIAGNOSIS — E039 Hypothyroidism, unspecified: Secondary | ICD-10-CM

## 2011-04-10 DIAGNOSIS — I10 Essential (primary) hypertension: Secondary | ICD-10-CM

## 2011-04-10 MED ORDER — FLUTICASONE PROPIONATE 50 MCG/ACT NA SUSP: 2.0000 | Freq: Every day | NASAL | Status: DC

## 2011-04-10 NOTE — Assessment & Plan Note (Signed)
Chronic nasal congestion likely multifactorial (allergies/ temp change/ small nasal passages) No imp with nasal saline alone Will try daily flonase and update  Adv to call if fever/ facial pain or symptoms of sinusits at any time

## 2011-04-10 NOTE — Assessment & Plan Note (Signed)
Lipids are improved with better diet (ie: cutting down on fatty ice cream and sweets) Disc goals for lipids and reasons to control them Rev labs with pt Rev low sat fat diet in detail  Will continue simvastatin and keep working on diet to get trig down Re check 6 mo and f/u for PE

## 2011-04-10 NOTE — Patient Instructions (Signed)
Cholesterol looks better  Keep working on low sat fat diet (Avoid red meat/ fried foods/ egg yolks/ fatty breakfast meats/ butter, cheese and high fat dairy/ and shellfish  ) Also work towards exercise 5 days per week - have fun with the boxing  No change in cholesterol medicine  Try the flonase for chronic nasal congestion  Schedule PE with labs prior in about 6 months

## 2011-04-10 NOTE — Assessment & Plan Note (Signed)
tsh theraputic and no clinical symptoms No dose change recommended

## 2011-04-10 NOTE — Assessment & Plan Note (Signed)
bp in fair control at this time  No changes needed  Disc lifstyle change with low sodium diet and exercise   

## 2011-04-10 NOTE — Progress Notes (Signed)
Subjective:    Patient ID: Sharon [name], female    DOB: 11-01-1950, 60 y.o.   MRN: [id number]  HPI Here for f/u of hyperlipidemia and hypothyroidism Also has a chronically stuffy nose - ? allergies Is feeling good !  Always has nasal congestion Not too much rhinorrhea  No colored d/c No fever No facial pain   Cholesterol- was up last time Is on zocor 20 Disc diet in detail - too much fatty food such as ice cream Stopped the ice cream , that is about it  Does not eat red meat or fried foods  Lab Results  Component Value Date   CHOL 246* 03/29/2011   CHOL 291* 12/28/2010   CHOL 251* 03/05/2009   Lab Results  Component Value Date   HDL 50.50 03/29/2011   HDL 40.98 12/28/2010   HDL 47.20 03/05/2009   Lab Results  Component Value Date   LDLCALC 86 05/09/2006   Lab Results  Component Value Date   TRIG 234.0* 03/29/2011   TRIG 361.0* 12/28/2010   TRIG 268.0* 03/05/2009   Lab Results  Component Value Date   CHOLHDL 5 03/29/2011   CHOLHDL 6 12/28/2010   CHOLHDL 5 03/05/2009   Lab Results  Component Value Date   LDLDIRECT 85.4 03/29/2011   LDLDIRECT 107.4 12/28/2010   LDLDIRECT 95.0 03/05/2009   overall is better Trig down  LDL is 85 Commended!  Hypothyroid tsh is theraputic Lab Results  Component Value Date   TSH 2.79 03/29/2011   on levothyroxine Clinically feels in balance - never had symptoms to begin with  No change in skin or hair  Wt is stable   Is thinking about working on her weight  Has a weight machine to put together , and also wants to get a heavy bag to punch  [facility]   Patient Active Problem List  Diagnoses  . HYPOTHYROIDISM  . HYPERLIPIDEMIA  . ANXIETY  . HYPERTENSION  . GERD  . ARTHRALGIA  . Vitamin D deficiency  . Nasal congestion   Past Medical History  Diagnosis Date  . Anxiety   . GERD (gastroesophageal reflux disease)   . Hyperlipidemia   . Hypertension   . Hypothyroidism   . Vitamin D deficiency    Past  Surgical History  Procedure Date  . Vein ligation and stripping     x2  . Tubal ligation   . Leep    History  Substance Use Topics  . Smoking status: Former Smoker    Quit date: 04/24/1996  . Smokeless tobacco: Not on file  . Alcohol Use: Not on file   Family History  Problem Relation Age of Onset  . Transient ischemic attack Mother   . Hypertension Mother   . Hypertension Father    Allergies  Allergen Reactions  . Amlodipine Besylate     REACTION: edema  . Atorvastatin     REACTION: muscle pain  . Triple Antibiotic     REACTION: rash   Current Outpatient Prescriptions on File Prior to Visit  Medication Sig Dispense Refill  . aspirin 325 MG EC tablet Take 163 mg by mouth daily.        Marland Kitchen buPROPion (WELLBUTRIN XL) 150 MG 24 hr tablet Take 1 tablet (150 mg total) by mouth daily.  90 tablet  3  . estradiol (ESTRACE) 0.1 MG/GM vaginal cream Use 2 times a week as directed (about 2 cm in applicator).       Marland Kitchen  hydrochlorothiazide 50 MG tablet Take 1 tablet (50 mg total) by mouth daily.  90 tablet  3  . levothyroxine (SYNTHROID, LEVOTHROID) 50 MCG tablet Take 1 tablet (50 mcg total) by mouth daily.  90 tablet  3  . losartan (COZAAR) 100 MG tablet Take 1 tablet (100 mg total) by mouth daily.  90 tablet  3  . Multiple Minerals-Vitamins (CITRACAL PLUS PO) Take 1 tablet by mouth 2 (two) times daily.       . Multiple Vitamin (MULTIVITAMIN) tablet Take 1 tablet by mouth daily.        . pantoprazole (PROTONIX) 40 MG tablet Take 1 tablet (40 mg total) by mouth daily.  90 tablet  3  . RESTASIS 0.05 % ophthalmic emulsion Place 1 drop into both eyes Twice daily.      . sertraline (ZOLOFT) 100 MG tablet Take 2 tablets (200 mg total) by mouth daily.  180 tablet  3  . simvastatin (ZOCOR) 20 MG tablet Take 1 tablet (20 mg total) by mouth at bedtime.  90 tablet  3  . folic acid (FOLVITE) 1 MG tablet Take 1 mg by mouth daily.           Review of Systems Review of Systems  Constitutional:  Negative for fever, appetite change, fatigue and unexpected weight change.  Eyes: Negative for pain and visual disturbance.  ENT neg for sore throat or ear pain  Respiratory: Negative for cough and shortness of breath.   Cardiovascular: Negative for cp or palpitations    Gastrointestinal: Negative for nausea, diarrhea and constipation.  Genitourinary: Negative for urgency and frequency.  Skin: Negative for pallor or rash  neg for dryness or hair loss  Neurological: Negative for weakness, light-headedness, numbness and headaches.  Hematological: Negative for adenopathy. Does not bruise/bleed easily.  Psychiatric/Behavioral: Negative for dysphoric mood. The patient is not nervous/anxious.          Objective:   Physical Exam  Constitutional: She appears well-developed and well-nourished.  HENT:  Head: Normocephalic and atraumatic.  Right Ear: External ear normal.  Left Ear: External ear normal.  Mouth/Throat: Oropharynx is clear and moist.       Nares are injected and congested  Worse on the L No sinus tenderness  Eyes: Conjunctivae and EOM are normal. Pupils are equal, round, and reactive to light. Right eye exhibits no discharge. Left eye exhibits no discharge. No scleral icterus.  Neck: Normal range of motion. Neck supple. No JVD present. Carotid bruit is not present. No thyromegaly present.  Cardiovascular: Normal rate, regular rhythm, normal heart sounds and intact distal pulses.   Pulmonary/Chest: Effort normal and breath sounds normal. No respiratory distress. She has no wheezes.  Abdominal: Soft. Bowel sounds are normal. She exhibits no abdominal bruit.  Musculoskeletal: Normal range of motion. She exhibits no edema and no tenderness.  Lymphadenopathy:    She has no cervical adenopathy.  Neurological: She is alert. She has normal reflexes. She displays no atrophy and no tremor. No cranial nerve deficit. She exhibits normal muscle tone. Coordination normal.  Skin: Skin is warm  and dry. No rash noted. No erythema. No pallor.  Psychiatric: She has a normal mood and affect.          Assessment & Plan:

## 2011-10-02 ENCOUNTER — Telehealth: Payer: Self-pay | Admitting: Family Medicine

## 2011-10-02 DIAGNOSIS — E785 Hyperlipidemia, unspecified: Secondary | ICD-10-CM

## 2011-10-02 DIAGNOSIS — E559 Vitamin D deficiency, unspecified: Secondary | ICD-10-CM

## 2011-10-02 DIAGNOSIS — Z0000 Encounter for general adult medical examination without abnormal findings: Secondary | ICD-10-CM

## 2011-10-02 NOTE — Telephone Encounter (Signed)
Message copied by Judy Pimple on Mon Oct 02, 2011  9:33 PM ------      Message from: Baldomero Lamy      Created: Tue Sep 26, 2011  8:28 AM      Regarding: Cpx labs Tues 6/11       Please order  future cpx labs for pt's upcomming lab appt.      Thanks      Rodney Booze

## 2011-10-03 ENCOUNTER — Other Ambulatory Visit (INDEPENDENT_AMBULATORY_CARE_PROVIDER_SITE_OTHER): Payer: BC Managed Care – PPO

## 2011-10-03 DIAGNOSIS — Z0000 Encounter for general adult medical examination without abnormal findings: Secondary | ICD-10-CM

## 2011-10-03 DIAGNOSIS — E559 Vitamin D deficiency, unspecified: Secondary | ICD-10-CM

## 2011-10-03 DIAGNOSIS — E785 Hyperlipidemia, unspecified: Secondary | ICD-10-CM

## 2011-10-03 LAB — CBC WITH DIFFERENTIAL/PLATELET
Basophils Absolute: 0.1 10*3/uL (ref 0.0–0.1)
Basophils Relative: 0.8 % (ref 0.0–3.0)
Eosinophils Absolute: 0.9 10*3/uL — ABNORMAL HIGH (ref 0.0–0.7)
HCT: 38.1 % (ref 36.0–46.0)
Hemoglobin: 12.8 g/dL (ref 12.0–15.0)
Lymphs Abs: 2.3 10*3/uL (ref 0.7–4.0)
MCHC: 33.6 g/dL (ref 30.0–36.0)
Neutro Abs: 3.1 10*3/uL (ref 1.4–7.7)
RBC: 4.11 Mil/uL (ref 3.87–5.11)
RDW: 13.9 % (ref 11.5–14.6)

## 2011-10-03 LAB — LIPID PANEL
HDL: 48 mg/dL (ref >39.00)
Triglycerides: 360 mg/dL — ABNORMAL HIGH (ref 0.0–149.0)
VLDL: 72 mg/dL — ABNORMAL HIGH (ref 0.0–40.0)

## 2011-10-03 LAB — COMPREHENSIVE METABOLIC PANEL
ALT: 11 U/L (ref 0–35)
AST: 25 U/L (ref 0–37)
BUN: 13 mg/dL (ref 6–23)
CO2: 30 mEq/L (ref 19–32)
Calcium: 10 mg/dL (ref 8.4–10.5)
Chloride: 105 mEq/L (ref 96–112)
Creatinine, Ser: 0.9 mg/dL (ref 0.4–1.2)
GFR: 72.33 mL/min (ref >60.00)
Total Bilirubin: 0.1 mg/dL — ABNORMAL LOW (ref 0.3–1.2)

## 2011-10-09 ENCOUNTER — Ambulatory Visit (INDEPENDENT_AMBULATORY_CARE_PROVIDER_SITE_OTHER): Payer: BC Managed Care – PPO | Admitting: Family Medicine

## 2011-10-09 ENCOUNTER — Encounter: Payer: Self-pay | Admitting: Family Medicine

## 2011-10-09 VITALS — BP 116/78 | HR 78 | Temp 98.0°F | Ht 66.75 in | Wt 197.2 lb

## 2011-10-09 DIAGNOSIS — E039 Hypothyroidism, unspecified: Secondary | ICD-10-CM

## 2011-10-09 DIAGNOSIS — E559 Vitamin D deficiency, unspecified: Secondary | ICD-10-CM

## 2011-10-09 DIAGNOSIS — E876 Hypokalemia: Secondary | ICD-10-CM

## 2011-10-09 DIAGNOSIS — Z0000 Encounter for general adult medical examination without abnormal findings: Secondary | ICD-10-CM

## 2011-10-09 DIAGNOSIS — E785 Hyperlipidemia, unspecified: Secondary | ICD-10-CM

## 2011-10-09 DIAGNOSIS — I10 Essential (primary) hypertension: Secondary | ICD-10-CM

## 2011-10-09 MED ORDER — POTASSIUM CHLORIDE CRYS ER 10 MEQ PO TBCR: 10.0000 meq | EXTENDED_RELEASE_TABLET | Freq: Every day | ORAL | Status: DC

## 2011-10-09 NOTE — Patient Instructions (Addendum)
If you are interested in a shingles/zoster vaccine - call your insurance to check on coverage,( you should not get it within 1 month of other vaccines) , then call us for a prescription  for it to take to a pharmacy that gives the shot   Do not forget to make your colonoscopy appt  Start potassium supplement Make non fasting lab appt for 2 weeks for potassium Work hard to limit/ eliminate sweets - to prevent diabetes  Also work up to exercise 5 days per week for 30 minutes  Weight loss will be very important in preventing diabetes also

## 2011-10-09 NOTE — Progress Notes (Signed)
Subjective:    Patient ID: Sharon [name], female    DOB: 07/06/1950, 61 y.o.   MRN: [id number]  HPI Here for health maintenance exam and to review chronic medical problems    Also having non prod cough times 2 d  Every now and then gets a dry cough- tickle - and bad taste in her mouth - ? If getting a cold Very mild  Non productive GERD has been ok - occ breakthrough symptoms    HTN is well controlled bp 116/78 today  Wt is stable  K low at 3.3--does not take K  occ feels like she is going to get cramp in bed  On diuretic    Chemistry      Component Value Date/Time   NA 142 10/03/2011 0935   K 3.3* 10/03/2011 0935   CL 105 10/03/2011 0935   CO2 30 10/03/2011 0935   BUN 13 10/03/2011 0935   CREATININE 0.9 10/03/2011 0935      Component Value Date/Time   CALCIUM 10.0 10/03/2011 0935   ALKPHOS 70 10/03/2011 0935   AST 25 10/03/2011 0935   ALT 11 10/03/2011 0935   BILITOT 0.1* 10/03/2011 0935      Lab Results  Component Value Date   WBC 6.8 10/03/2011   HGB 12.8 10/03/2011   HCT 38.1 10/03/2011   MCV 92.7 10/03/2011   PLT 210.0 10/03/2011    Lab Results  Component Value Date   TSH 3.85 10/03/2011   No clinical changes-  Hypothyroid  Lab Results  Component Value Date   CHOL 250* 10/03/2011   CHOL 246* 03/29/2011   CHOL 291* 12/28/2010   Lab Results  Component Value Date   HDL 48.00 10/03/2011   HDL 78.29 03/29/2011   HDL 56.21 12/28/2010   Lab Results  Component Value Date   LDLCALC 86 05/09/2006   Lab Results  Component Value Date   TRIG 360.0* 10/03/2011   TRIG 234.0* 03/29/2011   TRIG 361.0* 12/28/2010   Lab Results  Component Value Date   CHOLHDL 5 10/03/2011   CHOLHDL 5 03/29/2011   CHOLHDL 6 12/28/2010   Lab Results  Component Value Date   LDLDIRECT 79.2 10/03/2011   LDLDIRECT 85.4 03/29/2011   LDLDIRECT 107.4 12/28/2010   triglycerides are up significantly Pt eats lots of sweets   Pap/ gyn Last exam was early this year - 6 mo ago - all ok    colonosc-  over 10 years ago- is due for screening  She will make her own appt with Dr Elsie Amis   Td was about a year - thinks Td   Zoster status - had mild shinges in past  Interested if insurance pays   mammo- about 6 mo ago - was fine  Self exam - no breast lumps   Patient Active Problem List  Diagnosis  . HYPOTHYROIDISM  . HYPERLIPIDEMIA  . ANXIETY  . HYPERTENSION  . GERD  . ARTHRALGIA  . Vitamin D deficiency disease  . Nasal congestion  . Routine general medical examination at a health care facility  . Hypokalemia   Past Medical History  Diagnosis Date  . Anxiety   . GERD (gastroesophageal reflux disease)   . Hyperlipidemia   . Hypertension   . Hypothyroidism   . Vitamin d deficiency    Past Surgical History  Procedure Date  . Vein ligation and stripping     x2  . Tubal ligation   . Leep    History  Substance Use Topics  . Smoking status: Former Smoker    Quit date: 04/24/1996  . Smokeless tobacco: Not on file  . Alcohol Use: Not on file   Family History  Problem Relation Age of Onset  . Transient ischemic attack Mother   . Hypertension Mother   . Hypertension Father    Allergies  Allergen Reactions  . Amlodipine Besylate     REACTION: edema  . Atorvastatin     REACTION: muscle pain  . Neomycin-Bacitracin Zn-Polymyx     REACTION: rash   Current Outpatient Prescriptions on File Prior to Visit  Medication Sig Dispense Refill  . aspirin 325 MG EC tablet Take 163 mg by mouth daily.        Marland Kitchen buPROPion (WELLBUTRIN XL) 150 MG 24 hr tablet Take 1 tablet (150 mg total) by mouth daily.  90 tablet  3  . estradiol (ESTRACE) 0.1 MG/GM vaginal cream Use 2 times a week as directed (about 2 cm in applicator).       . fluticasone (FLONASE) 50 MCG/ACT nasal spray Place 2 sprays into the nose daily.  16 g  11  . hydrochlorothiazide 50 MG tablet Take 1 tablet (50 mg total) by mouth daily.  90 tablet  3  . levothyroxine (SYNTHROID, LEVOTHROID) 50 MCG tablet Take 1 tablet (50  mcg total) by mouth daily.  90 tablet  3  . losartan (COZAAR) 100 MG tablet Take 1 tablet (100 mg total) by mouth daily.  90 tablet  3  . Multiple Minerals-Vitamins (CITRACAL PLUS PO) Take 1 tablet by mouth 2 (two) times daily.       . Multiple Vitamin (MULTIVITAMIN) tablet Take 1 tablet by mouth daily.        . pantoprazole (PROTONIX) 40 MG tablet Take 1 tablet (40 mg total) by mouth daily.  90 tablet  3  . RESTASIS 0.05 % ophthalmic emulsion Place 1 drop into both eyes Twice daily.      . sertraline (ZOLOFT) 100 MG tablet Take 2 tablets (200 mg total) by mouth daily.  180 tablet  3  . simvastatin (ZOCOR) 20 MG tablet Take 1 tablet (20 mg total) by mouth at bedtime.  90 tablet  3  . folic acid (FOLVITE) 1 MG tablet Take 1 mg by mouth daily.        . potassium chloride (K-DUR,KLOR-CON) 10 MEQ tablet Take 1 tablet (10 mEq total) by mouth daily. With food  30 tablet  11       Review of Systems Review of Systems  Constitutional: Negative for fever, appetite change, fatigue and unexpected weight change.  Eyes: Negative for pain and visual disturbance.  Respiratory: Negative for  shortness of breath or  Wheeze, pos for mild cough for 2 days  Cardiovascular: Negative for cp or palpitations    Gastrointestinal: Negative for nausea, diarrhea and constipation.  Genitourinary: Negative for urgency and frequency.  Skin: Negative for pallor or rash   Neurological: Negative for weakness, light-headedness, numbness and headaches.  Hematological: Negative for adenopathy. Does not bruise/bleed easily.  Psychiatric/Behavioral: Negative for dysphoric mood. The patient is not nervous/anxious.         Objective:   Physical Exam  Constitutional: She is oriented to person, place, and time. She appears well-developed and well-nourished. No distress.       overwt and well appearing   HENT:  Head: Normocephalic and atraumatic.  Right Ear: External ear normal.  Left Ear: External ear normal.  Nose: Nose  normal.  Mouth/Throat: Oropharynx is clear and moist.  Eyes: Conjunctivae and EOM are normal. Pupils are equal, round, and reactive to light. No scleral icterus.  Neck: Normal range of motion. Neck supple. No JVD present. Carotid bruit is not present. No thyromegaly present.  Cardiovascular: Normal rate, regular rhythm, normal heart sounds and intact distal pulses.  Exam reveals no gallop.   Pulmonary/Chest: Effort normal and breath sounds normal. No respiratory distress. She has no wheezes. She has no rales. She exhibits no tenderness.  Abdominal: Soft. Bowel sounds are normal. She exhibits no distension, no abdominal bruit and no mass. There is no tenderness.  Musculoskeletal: Normal range of motion. She exhibits no edema and no tenderness.  Lymphadenopathy:    She has no cervical adenopathy.  Neurological: She is alert and oriented to person, place, and time. She has normal reflexes. No cranial nerve deficit. She exhibits normal muscle tone. Coordination normal.  Skin: Skin is warm and dry. No rash noted. No erythema. No pallor.       Some lentigos   Psychiatric: She has a normal mood and affect.          Assessment & Plan:

## 2011-10-12 NOTE — Assessment & Plan Note (Signed)
bp in fair control at this time  No changes needed  Disc lifstyle change with low sodium diet and exercise  Labs reviewed  

## 2011-10-12 NOTE — Assessment & Plan Note (Signed)
tsh is theraputic and no clinical changes  No change in dose Labs reviewed with pt

## 2011-10-12 NOTE — Assessment & Plan Note (Signed)
On statin and diet Disc goals for lipids and reasons to control them Rev labs with pt Rev low sat fat diet in detail  

## 2011-10-12 NOTE — Assessment & Plan Note (Signed)
This is new and likely due to her diuretic  Some cramping Will start on low dose K supplement 10 meq Also disc high K foods Re check level in 2 weeks-stressed imp of this

## 2011-10-12 NOTE — Assessment & Plan Note (Signed)
Reviewed health habits including diet and exercise and skin cancer prevention Also reviewed health mt list, fam hx and immunizations  Disc imp of wt loss for overall health  Labs rev with pt

## 2011-10-12 NOTE — Assessment & Plan Note (Signed)
Rev dose of D to take - and imp to bone and overall health

## 2011-10-23 ENCOUNTER — Other Ambulatory Visit (INDEPENDENT_AMBULATORY_CARE_PROVIDER_SITE_OTHER): Payer: BC Managed Care – PPO

## 2011-10-23 DIAGNOSIS — E876 Hypokalemia: Secondary | ICD-10-CM

## 2011-10-23 LAB — POTASSIUM: Potassium: 4.1 mEq/L (ref 3.5–5.1)

## 2012-02-11 ENCOUNTER — Other Ambulatory Visit: Payer: Self-pay | Admitting: Family Medicine

## 2012-02-12 NOTE — Telephone Encounter (Signed)
She can have 12 mo of refils, thanks 

## 2012-02-12 NOTE — Telephone Encounter (Signed)
Ok to refill 

## 2012-02-24 ENCOUNTER — Other Ambulatory Visit: Payer: Self-pay | Admitting: Family Medicine

## 2012-03-12 ENCOUNTER — Ambulatory Visit (INDEPENDENT_AMBULATORY_CARE_PROVIDER_SITE_OTHER): Payer: BC Managed Care – PPO

## 2012-03-12 DIAGNOSIS — Z23 Encounter for immunization: Secondary | ICD-10-CM

## 2012-03-29 ENCOUNTER — Other Ambulatory Visit: Payer: Self-pay | Admitting: Family Medicine

## 2012-03-29 NOTE — Telephone Encounter (Signed)
Ok to refill 

## 2012-03-29 NOTE — Telephone Encounter (Signed)
Please refil both for 12 months, thanks

## 2012-04-25 LAB — HM DEXA SCAN

## 2012-10-10 ENCOUNTER — Other Ambulatory Visit: Payer: Self-pay | Admitting: Family Medicine

## 2012-10-10 NOTE — Telephone Encounter (Signed)
Please schedule f/u and refill until then, thanks 

## 2012-10-10 NOTE — Telephone Encounter (Signed)
Electronic refill request, no recent/future appts., please advise  

## 2012-10-16 NOTE — Telephone Encounter (Signed)
Left voicemail requesting pt to call office 

## 2012-10-17 NOTE — Telephone Encounter (Signed)
Left voicemail requesting pt to call office 

## 2012-10-17 NOTE — Telephone Encounter (Signed)
Pt left v/m requesting cb [id number].

## 2012-10-18 NOTE — Telephone Encounter (Signed)
Pt scheduled an CPE for Nov, meds refilled until then

## 2012-10-18 NOTE — Telephone Encounter (Signed)
Pt left vm requesting that Sh[facility] call her.

## 2012-10-26 ENCOUNTER — Other Ambulatory Visit: Payer: Self-pay | Admitting: Family Medicine

## 2012-12-24 ENCOUNTER — Other Ambulatory Visit: Payer: Self-pay | Admitting: Family Medicine

## 2013-02-14 ENCOUNTER — Other Ambulatory Visit: Payer: Self-pay | Admitting: Family Medicine

## 2013-02-15 ENCOUNTER — Other Ambulatory Visit: Payer: Self-pay | Admitting: Family Medicine

## 2013-02-17 NOTE — Telephone Encounter (Signed)
done

## 2013-02-17 NOTE — Telephone Encounter (Signed)
Electronic refill request, please advise  

## 2013-02-17 NOTE — Telephone Encounter (Signed)
Refill for 1 mo please

## 2013-02-26 ENCOUNTER — Telehealth: Payer: Self-pay | Admitting: Family Medicine

## 2013-02-26 ENCOUNTER — Other Ambulatory Visit (INDEPENDENT_AMBULATORY_CARE_PROVIDER_SITE_OTHER): Payer: BC Managed Care – PPO

## 2013-02-26 DIAGNOSIS — E785 Hyperlipidemia, unspecified: Secondary | ICD-10-CM

## 2013-02-26 DIAGNOSIS — Z0000 Encounter for general adult medical examination without abnormal findings: Secondary | ICD-10-CM

## 2013-02-26 DIAGNOSIS — E559 Vitamin D deficiency, unspecified: Secondary | ICD-10-CM

## 2013-02-26 DIAGNOSIS — E039 Hypothyroidism, unspecified: Secondary | ICD-10-CM

## 2013-02-26 DIAGNOSIS — E876 Hypokalemia: Secondary | ICD-10-CM

## 2013-02-26 DIAGNOSIS — I10 Essential (primary) hypertension: Secondary | ICD-10-CM

## 2013-02-26 LAB — LIPID PANEL
Cholesterol: 301 mg/dL — ABNORMAL HIGH (ref 0–200)
HDL: 52.5 mg/dL (ref >39.00)
Total CHOL/HDL Ratio: 6
Triglycerides: 394 mg/dL — ABNORMAL HIGH (ref 0.0–149.0)

## 2013-02-26 LAB — CBC WITH DIFFERENTIAL/PLATELET
Basophils Relative: 0.8 % (ref 0.0–3.0)
Eosinophils Absolute: 0.3 10*3/uL (ref 0.0–0.7)
Eosinophils Relative: 6.2 % — ABNORMAL HIGH (ref 0.0–5.0)
HCT: 36.8 % (ref 36.0–46.0)
Hemoglobin: 12.5 g/dL (ref 12.0–15.0)
Lymphs Abs: 1.7 10*3/uL (ref 0.7–4.0)
MCV: 90.9 fl (ref 78.0–100.0)
Monocytes Absolute: 0.4 10*3/uL (ref 0.1–1.0)
Monocytes Relative: 7.1 % (ref 3.0–12.0)
Platelets: 203 10*3/uL (ref 150.0–400.0)
RBC: 4.05 Mil/uL (ref 3.87–5.11)
WBC: 5.6 10*3/uL (ref 4.5–10.5)

## 2013-02-26 LAB — COMPREHENSIVE METABOLIC PANEL
ALT: 12 U/L (ref 0–35)
AST: 23 U/L (ref 0–37)
Albumin: 4 g/dL (ref 3.5–5.2)
Alkaline Phosphatase: 67 U/L (ref 39–117)
BUN: 15 mg/dL (ref 6–23)
CO2: 30 mEq/L (ref 19–32)
Creatinine, Ser: 0.9 mg/dL (ref 0.4–1.2)
GFR: 72 mL/min (ref >60.00)
Glucose, Bld: 88 mg/dL (ref 70–99)
Potassium: 3.8 mEq/L (ref 3.5–5.1)
Sodium: 137 mEq/L (ref 135–145)
Total Bilirubin: 0.3 mg/dL (ref 0.3–1.2)
Total Protein: 7.4 g/dL (ref 6.0–8.3)

## 2013-02-26 LAB — TSH: TSH: 3.07 u[IU]/mL (ref 0.35–5.50)

## 2013-02-26 LAB — LDL CHOLESTEROL, DIRECT: Direct LDL: 108.5 mg/dL

## 2013-02-26 NOTE — Telephone Encounter (Signed)
Message copied by Judy Pimple on Wed Feb 26, 2013  6:18 AM ------      Message from: Alvina Chou      Created: Tue Feb 25, 2013  9:08 AM      Regarding: Lab orders for Wednesday, 11.5.14       Patient is scheduled for CPX labs, please order future labs, Thanks , Terri       ------

## 2013-03-03 ENCOUNTER — Ambulatory Visit (INDEPENDENT_AMBULATORY_CARE_PROVIDER_SITE_OTHER): Payer: BC Managed Care – PPO | Admitting: Family Medicine

## 2013-03-03 ENCOUNTER — Encounter: Payer: Self-pay | Admitting: Internal Medicine

## 2013-03-03 ENCOUNTER — Encounter: Payer: Self-pay | Admitting: Family Medicine

## 2013-03-03 VITALS — BP 122/80 | HR 80 | Temp 98.4°F | Ht 66.75 in | Wt 196.0 lb

## 2013-03-03 DIAGNOSIS — Z0000 Encounter for general adult medical examination without abnormal findings: Secondary | ICD-10-CM

## 2013-03-03 DIAGNOSIS — I10 Essential (primary) hypertension: Secondary | ICD-10-CM

## 2013-03-03 DIAGNOSIS — Z23 Encounter for immunization: Secondary | ICD-10-CM

## 2013-03-03 DIAGNOSIS — R002 Palpitations: Secondary | ICD-10-CM

## 2013-03-03 DIAGNOSIS — E559 Vitamin D deficiency, unspecified: Secondary | ICD-10-CM

## 2013-03-03 DIAGNOSIS — E039 Hypothyroidism, unspecified: Secondary | ICD-10-CM

## 2013-03-03 DIAGNOSIS — E785 Hyperlipidemia, unspecified: Secondary | ICD-10-CM

## 2013-03-03 DIAGNOSIS — Z1211 Encounter for screening for malignant neoplasm of colon: Secondary | ICD-10-CM | POA: Insufficient documentation

## 2013-03-03 MED ORDER — SERTRALINE HCL 100 MG PO TABS: 100.0000 mg | ORAL_TABLET | Freq: Every day | ORAL | Status: DC

## 2013-03-03 MED ORDER — BUPROPION HCL ER (XL) 150 MG PO TB24: 150.0000 mg | ORAL_TABLET | Freq: Every day | ORAL | Status: DC

## 2013-03-03 MED ORDER — LEVOTHYROXINE SODIUM 50 MCG PO TABS: 50.0000 ug | ORAL_TABLET | Freq: Every day | ORAL | Status: DC

## 2013-03-03 MED ORDER — HYDROCHLOROTHIAZIDE 50 MG PO TABS: 50.0000 mg | ORAL_TABLET | Freq: Every day | ORAL | Status: DC

## 2013-03-03 MED ORDER — PANTOPRAZOLE SODIUM 40 MG PO TBEC: 40.0000 mg | DELAYED_RELEASE_TABLET | Freq: Every day | ORAL | Status: DC

## 2013-03-03 MED ORDER — SIMVASTATIN 20 MG PO TABS: 20.0000 mg | ORAL_TABLET | Freq: Every day | ORAL | Status: DC

## 2013-03-03 MED ORDER — LOSARTAN POTASSIUM 100 MG PO TABS: 100.0000 mg | ORAL_TABLET | Freq: Every day | ORAL | Status: DC

## 2013-03-03 NOTE — Assessment & Plan Note (Signed)
Disc goals for lipids and reasons to control them Rev labs with pt Rev low sat fat diet in detail  zocor and diet- plans to work harder on low sat fat diet

## 2013-03-03 NOTE — Assessment & Plan Note (Signed)
EKG- NSR with mildly short PR interval and rate of 73 no acute changes Pt has hx of MVP Plan made to dec caffeine by one serving per week until gone  If palpitations continue - will update and consider monitor

## 2013-03-03 NOTE — Patient Instructions (Signed)
For palpitations - start cutting caffeine by one serving per week - let me know if no improvement  If you are interested in a shingles/zoster vaccine - call your insurance to check on coverage,( you should not get it within 1 month of other vaccines) , then call us for a prescription  for it to take to a pharmacy that gives the shot , or make a nurse visit to get it here depending on your coverage We will do referral for colonoscopy at check out  Add 1000 iu vitamin D3 over the counter daily to what you are taking Start exercising Avoid red meat/ fried foods/ egg yolks/ fatty breakfast meats/ butter, cheese and high fat dairy/ and shellfish   Schedule fasting labs in 3 months to see how cholesterol is

## 2013-03-03 NOTE — Assessment & Plan Note (Signed)
Adv to add additional 1000 iu daily  For bone and overall health

## 2013-03-03 NOTE — Assessment & Plan Note (Signed)
Reviewed health habits including diet and exercise and skin cancer prevention Also reviewed health mt list, fam hx and immunizations   Wellness labs reviewed  

## 2013-03-03 NOTE — Progress Notes (Signed)
Pre-visit discussion using our clinic review tool. No additional management support is needed unless otherwise documented below in the visit note.  

## 2013-03-03 NOTE — Assessment & Plan Note (Signed)
BP: 122/80 mmHg  bp in fair control at this time  No changes needed  Disc lifstyle change with low sodium diet and exercise   Lab rev

## 2013-03-03 NOTE — Assessment & Plan Note (Signed)
Hypothyroidism  Pt has no clinical changes No change in energy level/ hair or skin/ edema and no tremor Lab Results  Component Value Date   TSH 3.07 02/26/2013

## 2013-03-03 NOTE — Assessment & Plan Note (Signed)
Referred for screening colonoscopy 

## 2013-03-03 NOTE — Progress Notes (Signed)
Subjective:    Patient ID: Sharon [name], female    DOB: Jul 07, 1950, 62 y.o.   MRN: [id number]  HPI Here for health maintenance exam and to review chronic medical problems    Having issues with ? Sciatica  Noticed that she had pain in hips if she lay L side - shoots pain down to her leg occ has a heart palpitation -no pain or other problems No anxiety or new stress  No sob  Notices it during regular activity  Does drink 4 cups of coffee per day Also has hx of mitral valve prolapse   Does not exercise - would like to - start some yoga - also interested in [employer] Ewing DVD She has taken some classes in the past   Wt is down 1 lb   bp is up a bit today BP Readings from Last 3 Encounters:  03/03/13 136/90  10/09/11 116/78  04/10/11 116/72    Mammogram -2/14/ and it was normal Self exam- no lumps or changes   Zoster status - had shingles once / mild case Is interested in vaccine if covered   colonosc 1/03- wants to get the next one after the first of the year- and wants to go to [facility]    Flu vaccine- had that today   Pap 2/14 at gyn Hx of LEEP  Td 6/12  D level is 30  (she takes [facility] citrucal 2 pills daily) - ? 800 units  She has to take large doses at times Can add more  No fractures   Hypothyroidism  Pt has no clinical changes No change in energy level/ hair or skin/ edema and no tremor Lab Results  Component Value Date   TSH 3.07 02/26/2013     Hyperlipidemia Lab Results  Component Value Date   CHOL 301* 02/26/2013   CHOL 250* 10/03/2011   CHOL 246* 03/29/2011   Lab Results  Component Value Date   HDL 52.50 02/26/2013   HDL 40.98 10/03/2011   HDL 11.91 03/29/2011   Lab Results  Component Value Date   LDLCALC 86 05/09/2006   Lab Results  Component Value Date   TRIG 394.0* 02/26/2013   TRIG 360.0* 10/03/2011   TRIG 234.0* 03/29/2011   Lab Results  Component Value Date   CHOLHDL 6 02/26/2013   CHOLHDL 5 10/03/2011   CHOLHDL 5 03/29/2011   Lab  Results  Component Value Date   LDLDIRECT 108.5 02/26/2013   LDLDIRECT 79.2 10/03/2011   LDLDIRECT 85.4 03/29/2011   is eating terribly lately - eating lots of candy and too many sat fats   She needs to make a change   Patient Active Problem List   Diagnosis Date Noted  . Hypokalemia 10/09/2011  . Routine general medical examination at a health care facility 10/02/2011  . Nasal congestion 04/10/2011  . Vitamin D deficiency disease 12/28/2010  . ARTHRALGIA 08/19/2007  . HYPOTHYROIDISM 01/31/2007  . HYPERLIPIDEMIA 01/31/2007  . ANXIETY 01/31/2007  . HYPERTENSION 01/31/2007  . GERD 01/31/2007   Past Medical History  Diagnosis Date  . Anxiety   . GERD (gastroesophageal reflux disease)   . Hyperlipidemia   . Hypertension   . Hypothyroidism   . Vitamin D deficiency    Past Surgical History  Procedure Laterality Date  . Vein ligation and stripping      x2  . Tubal ligation    . Leep     History  Substance Use Topics  . Smoking status: Former Smoker  Quit date: 04/24/1996  . Smokeless tobacco: Not on file  . Alcohol Use: Yes     Comment: occ   Family History  Problem Relation Age of Onset  . Transient ischemic attack Mother   . Hypertension Mother   . Hypertension Father    Allergies  Allergen Reactions  . Amlodipine Besylate     REACTION: edema  . Atorvastatin     REACTION: muscle pain  . Neomycin-Bacitracin Zn-Polymyx     REACTION: rash   Current Outpatient Prescriptions on File Prior to Visit  Medication Sig Dispense Refill  . buPROPion (WELLBUTRIN XL) 150 MG 24 hr tablet TAKE 1 TABLET (150 MG TOTAL) BY MOUTH DAILY  90 tablet  3  . estradiol (ESTRACE) 0.1 MG/GM vaginal cream Use 2 times a week as directed (about 2 cm in applicator).       . fluticasone (FLONASE) 50 MCG/ACT nasal spray USE 2 SPRAYS INTO EACH NOSTRIL DAILY.  16 g  2  . hydrochlorothiazide (HYDRODIURIL) 50 MG tablet TAKE 1 TABLET DAILY  90 tablet  1  . levothyroxine (SYNTHROID, LEVOTHROID) 50  MCG tablet TAKE 1 TABLET DAILY  90 tablet  1  . losartan (COZAAR) 100 MG tablet TAKE 1 TABLET DAILY  90 tablet  1  . Multiple Minerals-Vitamins (CITRACAL PLUS PO) Take 1 tablet by mouth 2 (two) times daily.       . pantoprazole (PROTONIX) 40 MG tablet TAKE 1 TABLET DAILY  90 tablet  1  . RESTASIS 0.05 % ophthalmic emulsion Place 1 drop into both eyes Twice daily.      . sertraline (ZOLOFT) 100 MG tablet TAKE 2 TABLETS DAILY  180 tablet  0  . simvastatin (ZOCOR) 20 MG tablet TAKE 1 TABLET DAILY  90 tablet  1   No current facility-administered medications on file prior to visit.       Review of Systems Review of Systems  Constitutional: Negative for fever, appetite change, fatigue and unexpected weight change.  Eyes: Negative for pain and visual disturbance.  Respiratory: Negative for cough and shortness of breath.   Cardiovascular: Negative for cp and pos for occas  palpitations    Gastrointestinal: Negative for nausea, diarrhea and constipation.  Genitourinary: Negative for urgency and frequency.  Skin: Negative for pallor or rash   MSK pos for low back pain  Neurological: Negative for weakness, light-headedness, numbness and headaches.  Hematological: Negative for adenopathy. Does not bruise/bleed easily.  Psychiatric/Behavioral: Negative for dysphoric mood. The patient is not nervous/anxious.         Objective:   Physical Exam  Constitutional: She appears well-developed and well-nourished. No distress.  HENT:  Head: Normocephalic and atraumatic.  Right Ear: External ear normal.  Left Ear: External ear normal.  Nose: Nose normal.  Mouth/Throat: Oropharynx is clear and moist.  Eyes: Conjunctivae and EOM are normal. Pupils are equal, round, and reactive to light. Right eye exhibits no discharge. Left eye exhibits no discharge. No scleral icterus.  Neck: Normal range of motion. Neck supple. No JVD present. No thyromegaly present.  Cardiovascular: Normal rate, regular rhythm,  normal heart sounds and intact distal pulses.  Exam reveals no gallop.   EKG rate of 73 with nl rhythm and borderline short PR interval  Pulmonary/Chest: Effort normal and breath sounds normal. No respiratory distress. She has no wheezes. She has no rales.  Abdominal: Soft. Bowel sounds are normal. She exhibits no distension and no mass. There is no tenderness.  Genitourinary:  Gyn exam  done by her gynecologist  Musculoskeletal: She exhibits no edema and no tenderness.  Nl rom of LS   Lymphadenopathy:    She has no cervical adenopathy.  Neurological: She is alert. She has normal reflexes. No cranial nerve deficit. She exhibits normal muscle tone. Coordination normal.  Skin: Skin is warm and dry. No rash noted. No erythema. No pallor.  Psychiatric: She has a normal mood and affect.          Assessment & Plan:

## 2013-04-10 ENCOUNTER — Ambulatory Visit (INDEPENDENT_AMBULATORY_CARE_PROVIDER_SITE_OTHER): Payer: BC Managed Care – PPO

## 2013-04-10 DIAGNOSIS — Z2911 Encounter for prophylactic immunotherapy for respiratory syncytial virus (RSV): Secondary | ICD-10-CM

## 2013-04-10 DIAGNOSIS — Z23 Encounter for immunization: Secondary | ICD-10-CM

## 2013-05-05 ENCOUNTER — Ambulatory Visit (AMBULATORY_SURGERY_CENTER): Payer: Self-pay | Admitting: *Deleted

## 2013-05-05 VITALS — Ht 67.5 in | Wt 199.2 lb

## 2013-05-05 DIAGNOSIS — Z1211 Encounter for screening for malignant neoplasm of colon: Secondary | ICD-10-CM

## 2013-05-05 MED ORDER — NA SULFATE-K SULFATE-MG SULF 17.5-3.13-1.6 GM/177ML PO SOLN: ORAL | Status: DC

## 2013-05-05 NOTE — Progress Notes (Signed)
Patient denies any allergies to eggs or soy. Patient denies any problems with anesthesia.  

## 2013-05-07 ENCOUNTER — Encounter: Payer: Self-pay | Admitting: Internal Medicine

## 2013-05-19 ENCOUNTER — Ambulatory Visit (AMBULATORY_SURGERY_CENTER): Payer: Managed Care, Other (non HMO) | Admitting: Internal Medicine

## 2013-05-19 ENCOUNTER — Encounter: Payer: Self-pay | Admitting: Internal Medicine

## 2013-05-19 VITALS — BP 106/74 | HR 72 | Temp 96.1°F | Resp 16 | Ht 67.5 in | Wt 199.0 lb

## 2013-05-19 DIAGNOSIS — Z1211 Encounter for screening for malignant neoplasm of colon: Secondary | ICD-10-CM

## 2013-05-19 DIAGNOSIS — K5730 Diverticulosis of large intestine without perforation or abscess without bleeding: Secondary | ICD-10-CM

## 2013-05-19 MED ORDER — SODIUM CHLORIDE 0.9 % IV SOLN: 500.0000 mL | INTRAVENOUS | Status: DC

## 2013-05-19 NOTE — Progress Notes (Signed)
Procedure ends, to recovery, report given and VSS. 

## 2013-05-19 NOTE — Patient Instructions (Addendum)
No polyps or cancer seen. You do have diverticulosis - thickened muscle rings and pouches in the colon wall. Please read the handout about this condition.  Please contact my office and you may make an appointment to discuss your reflux issues. In the meantime try a Pepcid complete (generic ok) at bedtime and please review reflux instructions provided.  I appreciate the opportunity to care for you. Iva Booparl E. Dalal Livengood, MD, FACG  YOU HAD AN ENDOSCOPIC PROCEDURE TODAY AT THE  ENDOSCOPY CENTER: Refer to the procedure report that was given to you for any specific questions about what was found during the examination.  If the procedure report does not answer your questions, please call your gastroenterologist to clarify.  If you requested that your care partner not be given the details of your procedure findings, then the procedure report has been included in a sealed envelope for you to review at your convenience later.  YOU SHOULD EXPECT: Some feelings of bloating in the abdomen. Passage of more gas than usual.  Walking can help get rid of the air that was put into your GI tract during the procedure and reduce the bloating. If you had a lower endoscopy (such as a colonoscopy or flexible sigmoidoscopy) you may notice spotting of blood in your stool or on the toilet paper. If you underwent a bowel prep for your procedure, then you may not have a normal bowel movement for a few days.  DIET: Your first meal following the procedure should be a light meal and then it is ok to progress to your normal diet.  A half-sandwich or bowl of soup is an example of a good first meal.  Heavy or fried foods are harder to digest and may make you feel nauseous or bloated.  Likewise meals heavy in dairy and vegetables can cause extra gas to form and this can also increase the bloating.  Drink plenty of fluids but you should avoid alcoholic beverages for 24 hours.  ACTIVITY: Your care partner should take you home directly  after the procedure.  You should plan to take it easy, moving slowly for the rest of the day.  You can resume normal activity the day after the procedure however you should NOT DRIVE or use heavy machinery for 24 hours (because of the sedation medicines used during the test).    SYMPTOMS TO REPORT IMMEDIATELY: A gastroenterologist can be reached at any hour.  During normal business hours, 8:30 AM to 5:00 PM Monday through Friday, call [phone].  After hours and on weekends, please call the GI answering service at [phone] who will take a message and have the physician on call contact you.   Following lower endoscopy (colonoscopy or flexible sigmoidoscopy):  Excessive amounts of blood in the stool  Significant tenderness or worsening of abdominal pains  Swelling of the abdomen that is new, acute  Fever of 100F or higher    FOLLOW UP: If any biopsies were taken you will be contacted by phone or by letter within the next 1-3 weeks.  Call your gastroenterologist if you have not heard about the biopsies in 3 weeks.  Our staff will call the home number listed on your records the next business day following your procedure to check on you and address any questions or concerns that you may have at that time regarding the information given to you following your procedure. This is a courtesy call and so if there is no answer at the home number and  we have not heard from you through the emergency physician on call, we will assume that you have returned to your regular daily activities without incident.  SIGNATURES/CONFIDENTIALITY: You and/or your care partner have signed paperwork which will be entered into your electronic medical record.  These signatures attest to the fact that that the information above on your After Visit Summary has been reviewed and is understood.  Full responsibility of the confidentiality of this discharge information lies with you and/or your care-partner.   Schedule  office visit with Dr Leone Payor regarding GERD  GERD information given to you today  Diverticulosis information given to you today also

## 2013-05-19 NOTE — Op Note (Signed)
[facility] [address] [place], [id number]   COLONOSCOPY PROCEDURE REPORT  PATIENT: Sharon GrumblingCollins, Alishah B.  MR#: [id number] BIRTHDATE: [birth year] , 62  yrs. old GENDER: Female ENDOSCOPIST: Iva Booparl E Kenidee Cregan, MD, [facility] REFERRED XB:JYNWGBY:Marne Fransisca ConnorsA Tower, M.D. PROCEDURE DATE:  05/19/2013 PROCEDURE:   Colonoscopy, screening First Screening Colonoscopy - Avg.  risk and is 50 yrs.  old or older - No.  Prior Negative Screening - Now for repeat screening. 10 or more years since last screening  History of Adenoma - Now for follow-up colonoscopy & has been > or = to 3 yrs.  N/A  Polyps Removed Today? No.  Recommend repeat exam, <10 yrs? No. ASA CLASS:   Class II INDICATIONS:average risk screening and Last colonoscopy performed 2002. MEDICATIONS: propofol (Diprivan) 200mg  IV, MAC sedation, administered by CRNA, and These medications were titrated to patient response per physician's verbal order  DESCRIPTION OF PROCEDURE:   After the risks benefits and alternatives of the procedure were thoroughly explained, informed consent was obtained.  A digital rectal exam revealed no abnormalities of the rectum.   The LB [id number]  endoscope was introduced through the anus and advanced to the cecum, which was identified by both the appendix and ileocecal valve. No adverse events experienced.   The quality of the prep was excellent using Suprep  The instrument was then slowly withdrawn as the colon was fully examined.  COLON FINDINGS: Mild diverticulosis was noted in the sigmoid colon. The colon mucosa was otherwise normal.   A right colon retroflexion was performed.  Retroflexed views revealed no abnormalities. The time to cecum=2 minutes 43 seconds.  Withdrawal time=8 minutes 56 seconds.  The scope was withdrawn and the procedure completed. COMPLICATIONS: There were no complications.  ENDOSCOPIC IMPRESSION: 1.   Mild diverticulosis was noted in the sigmoid colon 2.   The colon  mucosa was otherwise normal - excellent prep - second screening colonoscopy  RECOMMENDATIONS: Repeat colonoscopy 10 years - 2025 Pepcid Complete OTC at bedtime and GERD handout today - she is having nocturnal heartburn and reflux sxs. Patient to schedule office visit re: GERD   eSigned:  Iva Booparl E Lakeisa Heninger, MD, [facility] 05/19/2013 10:29 AM   cc: Judy PimpleMarne A Tower, MD and The Patient

## 2013-05-20 ENCOUNTER — Telehealth: Payer: Self-pay | Admitting: *Deleted

## 2013-05-20 NOTE — Telephone Encounter (Signed)
  Follow up Call-  Call back number 05/19/2013  Post procedure Call Back phone  # [phone] hm  Permission to leave phone message Yes     Patient questions:  Left message to call us if she needed us.

## 2013-06-03 ENCOUNTER — Other Ambulatory Visit (INDEPENDENT_AMBULATORY_CARE_PROVIDER_SITE_OTHER): Payer: Managed Care, Other (non HMO)

## 2013-06-03 DIAGNOSIS — E785 Hyperlipidemia, unspecified: Secondary | ICD-10-CM

## 2013-06-03 LAB — LIPID PANEL
CHOLESTEROL: 317 mg/dL — AB (ref 0–200)
HDL: 50.3 mg/dL (ref >39.00)
Total CHOL/HDL Ratio: 6
Triglycerides: 377 mg/dL — ABNORMAL HIGH (ref 0.0–149.0)
VLDL: 75.4 mg/dL — AB (ref 0.0–40.0)

## 2013-06-03 LAB — LDL CHOLESTEROL, DIRECT: Direct LDL: 118.3 mg/dL

## 2013-06-20 ENCOUNTER — Encounter: Payer: Self-pay | Admitting: *Deleted

## 2013-06-23 ENCOUNTER — Encounter: Payer: Self-pay | Admitting: Family Medicine

## 2013-06-23 NOTE — Telephone Encounter (Signed)
Message copied by Judy PimpleWER, Pocahontas Cohenour A on Mon Jun 23, 2013  1:35 PM ------      Message from: Shon MilletWATLINGTON, SHAPALE M      Created: Mon Jun 23, 2013 12:17 PM       Pt advise of lab results and what foods to avoid. Pt is okay with increasing her zocor dose. Pt wants to know what will be the new dose and will [facility] ------

## 2013-06-23 NOTE — Telephone Encounter (Signed)
I [facility]? [facility] do not know [facility]  It is for 40 mg Re check lipid/ast/alt for hyperlipidemia in about 8 wk please  thanks

## 2013-06-24 MED ORDER — SIMVASTATIN 40 MG PO TABS: 40.0000 mg | ORAL_TABLET | Freq: Every day | ORAL | Status: DC

## 2013-06-24 NOTE — Telephone Encounter (Signed)
Left voicemail (per pt request) letting her know to increase Rx to 40mg  qd, new Rx sent into pharmacy and I requested pt to call back and schedule fasting lab appt in 8 weeks

## 2013-08-27 ENCOUNTER — Other Ambulatory Visit: Payer: Self-pay | Admitting: *Deleted

## 2013-08-27 MED ORDER — PANTOPRAZOLE SODIUM 40 MG PO TBEC: 40.0000 mg | DELAYED_RELEASE_TABLET | Freq: Every day | ORAL | Status: DC

## 2013-08-27 MED ORDER — SERTRALINE HCL 100 MG PO TABS: 100.0000 mg | ORAL_TABLET | Freq: Every day | ORAL | Status: DC

## 2013-09-08 ENCOUNTER — Other Ambulatory Visit: Payer: Self-pay | Admitting: Family Medicine

## 2013-09-16 ENCOUNTER — Other Ambulatory Visit: Payer: Managed Care, Other (non HMO)

## 2013-09-16 ENCOUNTER — Telehealth (INDEPENDENT_AMBULATORY_CARE_PROVIDER_SITE_OTHER): Payer: Managed Care, Other (non HMO) | Admitting: Family Medicine

## 2013-09-16 DIAGNOSIS — Z0000 Encounter for general adult medical examination without abnormal findings: Secondary | ICD-10-CM

## 2013-09-16 DIAGNOSIS — E559 Vitamin D deficiency, unspecified: Secondary | ICD-10-CM

## 2013-09-16 LAB — CBC WITH DIFFERENTIAL/PLATELET
BASOS PCT: 0.3 % (ref 0.0–3.0)
Basophils Absolute: 0 10*3/uL (ref 0.0–0.1)
EOS ABS: 0.3 10*3/uL (ref 0.0–0.7)
EOS PCT: 4.7 % (ref 0.0–5.0)
HEMATOCRIT: 35.6 % — AB (ref 36.0–46.0)
Hemoglobin: 11.9 g/dL — ABNORMAL LOW (ref 12.0–15.0)
LYMPHS ABS: 1.9 10*3/uL (ref 0.7–4.0)
Lymphocytes Relative: 29.9 % (ref 12.0–46.0)
MCHC: 33.5 g/dL (ref 30.0–36.0)
MCV: 92.4 fl (ref 78.0–100.0)
MONO ABS: 0.4 10*3/uL (ref 0.1–1.0)
Monocytes Relative: 6.7 % (ref 3.0–12.0)
NEUTROS ABS: 3.7 10*3/uL (ref 1.4–7.7)
Neutrophils Relative %: 58.4 % (ref 43.0–77.0)
Platelets: 218 10*3/uL (ref 150.0–400.0)
RBC: 3.85 Mil/uL — AB (ref 3.87–5.11)
RDW: 13.8 % (ref 11.5–15.5)
WBC: 6.3 10*3/uL (ref 4.0–10.5)

## 2013-09-16 LAB — COMPREHENSIVE METABOLIC PANEL
ALK PHOS: 65 U/L (ref 39–117)
ALT: 14 U/L (ref 0–35)
AST: 27 U/L (ref 0–37)
Albumin: 3.8 g/dL (ref 3.5–5.2)
BILIRUBIN TOTAL: 0.5 mg/dL (ref 0.2–1.2)
BUN: 18 mg/dL (ref 6–23)
CO2: 28 mEq/L (ref 19–32)
CREATININE: 1 mg/dL (ref 0.4–1.2)
Calcium: 9.6 mg/dL (ref 8.4–10.5)
Chloride: 99 mEq/L (ref 96–112)
GFR: 61.71 mL/min (ref >60.00)
GLUCOSE: 92 mg/dL (ref 70–99)
Potassium: 3.2 mEq/L — ABNORMAL LOW (ref 3.5–5.1)
Sodium: 137 mEq/L (ref 135–145)
Total Protein: 7 g/dL (ref 6.0–8.3)

## 2013-09-16 LAB — LIPID PANEL
CHOLESTEROL: 297 mg/dL — AB (ref 0–200)
HDL: 51.5 mg/dL (ref >39.00)
LDL CALC: 160 mg/dL — AB (ref 0–99)
TRIGLYCERIDES: 427 mg/dL — AB (ref 0.0–149.0)
Total CHOL/HDL Ratio: 6
VLDL: 85.4 mg/dL — AB (ref 0.0–40.0)

## 2013-09-16 LAB — TSH: TSH: 2.5 u[IU]/mL (ref 0.35–4.50)

## 2013-09-16 NOTE — Telephone Encounter (Signed)
Message copied by Judy Pimple on Tue Sep 16, 2013 11:17 AM ------      Message from: Josph Macho A      Created: Tue Sep 16, 2013 10:12 AM       I need lab orders for her please. I drew enough for lipids, cmp, cbc, vit d, and b12. Thanks.  ------

## 2013-09-17 LAB — VITAMIN D 25 HYDROXY (VIT D DEFICIENCY, FRACTURES): Vit D, 25-Hydroxy: 32 ng/mL (ref 30–89)

## 2013-09-24 ENCOUNTER — Encounter: Payer: Self-pay | Admitting: Family Medicine

## 2013-09-24 ENCOUNTER — Ambulatory Visit (INDEPENDENT_AMBULATORY_CARE_PROVIDER_SITE_OTHER): Payer: Managed Care, Other (non HMO) | Admitting: Family Medicine

## 2013-09-24 VITALS — BP 112/76 | HR 80 | Temp 98.1°F | Ht 66.75 in | Wt 201.5 lb

## 2013-09-24 DIAGNOSIS — E876 Hypokalemia: Secondary | ICD-10-CM

## 2013-09-24 DIAGNOSIS — E559 Vitamin D deficiency, unspecified: Secondary | ICD-10-CM

## 2013-09-24 DIAGNOSIS — E785 Hyperlipidemia, unspecified: Secondary | ICD-10-CM

## 2013-09-24 DIAGNOSIS — D649 Anemia, unspecified: Secondary | ICD-10-CM | POA: Insufficient documentation

## 2013-09-24 DIAGNOSIS — I10 Essential (primary) hypertension: Secondary | ICD-10-CM

## 2013-09-24 MED ORDER — SIMVASTATIN 80 MG PO TABS: 80.0000 mg | ORAL_TABLET | Freq: Every day | ORAL | Status: DC

## 2013-09-24 MED ORDER — POTASSIUM CHLORIDE 20 MEQ PO PACK: 20.0000 meq | PACK | ORAL | Status: DC

## 2013-09-24 NOTE — Patient Instructions (Addendum)
Eat a low sat fat diet    Avoid red meat/ fried foods/ egg yolks/ fatty breakfast meats/ butter, cheese and high fat dairy/ and shellfish   Increase simvastatin to 80 mg once daily (double up on the 40s until they are gone and new px is for 80 mg tab) Schedule fasting lab with Terri in 6-8 weeks  Increase otc vit D to 2000 iu daily We will watch your blood count - eat deep leafy greens for iron     Fat and Cholesterol Control Diet Fat and cholesterol levels in your blood and organs are influenced by your diet. High levels of fat and cholesterol may lead to diseases of the heart, small and large blood vessels, gallbladder, liver, and pancreas. CONTROLLING FAT AND CHOLESTEROL WITH DIET Although exercise and lifestyle factors are important, your diet is key. That is because certain foods are known to raise cholesterol and others to lower it. The goal is to balance foods for their effect on cholesterol and more importantly, to replace saturated and trans fat with other types of fat, such as monounsaturated fat, polyunsaturated fat, and omega-3 fatty acids. On average, a person should consume no more than 15 to 17 g of saturated fat daily. Saturated and trans fats are considered "bad" fats, and they will raise LDL cholesterol. Saturated fats are primarily found in animal products such as meats, butter, and cream. However, that does not mean you need to give up all your favorite foods. Today, there are good tasting, low-fat, low-cholesterol substitutes for most of the things you like to eat. Choose low-fat or nonfat alternatives. Choose round or loin cuts of red meat. These types of cuts are lowest in fat and cholesterol. Chicken (without the skin), fish, veal, and ground [place]key breast are great choices. Eliminate fatty meats, such as hot dogs and salami. Even shellfish have little or no saturated fat. Have a 3 oz (85 g) portion when you eat lean meat, poultry, or fish. Trans fats are also called "partially  hydrogenated oils." They are oils that have been scientifically manipulated so that they are solid at room temperature resulting in a longer shelf life and improved taste and texture of foods in which they are added. Trans fats are found in stick margarine, some tub margarines, cookies, crackers, and baked goods.  When baking and cooking, oils are a great substitute for butter. The monounsaturated oils are especially beneficial since it is believed they lower LDL and raise HDL. The oils you should avoid entirely are saturated tropical oils, such as coconut and palm.  Remember to eat a lot from food groups that are naturally free of saturated and trans fat, including fish, fruit, vegetables, beans, grains (barley, rice, couscous, bulgur wheat), and pasta (without cream sauces).  IDENTIFYING FOODS THAT LOWER FAT AND CHOLESTEROL  Soluble fiber may lower your cholesterol. This type of fiber is found in fruits such as apples, vegetables such as broccoli, potatoes, and carrots, legumes such as beans, peas, and lentils, and grains such as barley. Foods fortified with plant sterols (phytosterol) may also lower cholesterol. You should eat at least 2 g per day of these foods for a cholesterol lowering effect.  Read package labels to identify low-saturated fats, trans fat free, and low-fat foods at the supermarket. Select cheeses that have only 2 to 3 g saturated fat per ounce. Use a heart-healthy tub margarine that is free of trans fats or partially hydrogenated oil. When buying baked goods (cookies, crackers), avoid partially hydrogenated  oils. Breads and muffins should be made from whole grains (whole-wheat or whole oat flour, instead of "flour" or "enriched flour"). Buy non-creamy canned soups with reduced salt and no added fats.  FOOD PREPARATION TECHNIQUES  Never deep-fry. If you must fry, either stir-fry, which uses very little fat, or use non-stick cooking sprays. When possible, broil, bake, or roast meats, and  steam vegetables. Instead of putting butter or margarine on vegetables, use lemon and herbs, applesauce, and cinnamon (for squash and sweet potatoes). Use nonfat yogurt, salsa, and low-fat dressings for salads.  LOW-SATURATED FAT / LOW-FAT FOOD SUBSTITUTES Meats / Saturated Fat (g)  Avoid: Steak, marbled (3 oz/85 g) / 11 g  Choose: Steak, lean (3 oz/85 g) / 4 g  Avoid: Hamburger (3 oz/85 g) / 7 g  Choose: Hamburger, lean (3 oz/85 g) / 5 g  Avoid: Ham (3 oz/85 g) / 6 g  Choose: Ham, lean cut (3 oz/85 g) / 2.4 g  Avoid: Chicken, with skin, dark meat (3 oz/85 g) / 4 g  Choose: Chicken, skin removed, dark meat (3 oz/85 g) / 2 g  Avoid: Chicken, with skin, light meat (3 oz/85 g) / 2.5 g  Choose: Chicken, skin removed, light meat (3 oz/85 g) / 1 g Dairy / Saturated Fat (g)  Avoid: Whole milk (1 cup) / 5 g  Choose: Low-fat milk, 2% (1 cup) / 3 g  Choose: Low-fat milk, 1% (1 cup) / 1.5 g  Choose: Skim milk (1 cup) / 0.3 g  Avoid: Hard cheese (1 oz/28 g) / 6 g  Choose: Skim milk cheese (1 oz/28 g) / 2 to 3 g  Avoid: Cottage cheese, 4% fat (1 cup) / 6.5 g  Choose: Low-fat cottage cheese, 1% fat (1 cup) / 1.5 g  Avoid: Ice cream (1 cup) / 9 g  Choose: Sherbet (1 cup) / 2.5 g  Choose: Nonfat frozen yogurt (1 cup) / 0.3 g  Choose: Frozen fruit bar / trace  Avoid: Whipped cream (1 tbs) / 3.5 g  Choose: Nondairy whipped topping (1 tbs) / 1 g Condiments / Saturated Fat (g)  Avoid: Mayonnaise (1 tbs) / 2 g  Choose: Low-fat mayonnaise (1 tbs) / 1 g  Avoid: Butter (1 tbs) / 7 g  Choose: Extra light margarine (1 tbs) / 1 g  Avoid: Coconut oil (1 tbs) / 11.8 g  Choose: Olive oil (1 tbs) / 1.8 g  Choose: Corn oil (1 tbs) / 1.7 g  Choose: Safflower oil (1 tbs) / 1.2 g  Choose: Sunflower oil (1 tbs) / 1.4 g  Choose: Soybean oil (1 tbs) / 2.4 g  Choose: Canola oil (1 tbs) / 1 g Document Released: 04/10/2005 Document Revised: 08/05/2012 Document Reviewed:  09/29/2010 Exit[employer] Patient Information 2014 [place], [place].

## 2013-09-24 NOTE — Progress Notes (Signed)
Pre visit review using our clinic review tool, if applicable. No additional management support is needed unless otherwise documented below in the visit note. 

## 2013-09-24 NOTE — Progress Notes (Signed)
Subjective:    Patient ID: Sharon [name], female    DOB: 1950/10/15, 63 y.o.   MRN: [id number]  HPI Here for f/u of several lab abnormal Has been feeling same as usual  Has gained 2 lb - bmi of 31, unhappy about that    Low K   Chemistry      Component Value Date/Time   NA 137 09/16/2013 1133   K 3.2* 09/16/2013 1133   CL 99 09/16/2013 1133   CO2 28 09/16/2013 1133   BUN 18 09/16/2013 1133   CREATININE 1.0 09/16/2013 1133      Component Value Date/Time   CALCIUM 9.6 09/16/2013 1133   ALKPHOS 65 09/16/2013 1133   AST 27 09/16/2013 1133   ALT 14 09/16/2013 1133   BILITOT 0.5 09/16/2013 1133     she took potassium before and it upset her stomach - so she stopped it  Takes hctz  No leg cramping at all    Hyperlipidemia On simvastatin Lab Results  Component Value Date   CHOL 297* 09/16/2013   CHOL 317* 06/03/2013   CHOL 301* 02/26/2013   Lab Results  Component Value Date   HDL 51.50 09/16/2013   HDL 60.73 06/03/2013   HDL 71.06 02/26/2013   Lab Results  Component Value Date   LDLCALC 160* 09/16/2013   LDLCALC 86 05/09/2006   Lab Results  Component Value Date   TRIG 427.0* 09/16/2013   TRIG 377.0* 06/03/2013   TRIG 394.0* 02/26/2013   Lab Results  Component Value Date   CHOLHDL 6 09/16/2013   CHOLHDL 6 06/03/2013   CHOLHDL 6 02/26/2013   Lab Results  Component Value Date   LDLDIRECT 118.3 06/03/2013   LDLDIRECT 108.5 02/26/2013   LDLDIRECT 79.2 10/03/2011   takes simvastatin 40 mg  No side effects  Eating is "not good" but not a lot of high cholesterol foods - avoids butter/ margarine/ ice cream  Red meat- once every 2 weeks or more , fried foods once per week , no fast food, occ cheese , no shellfish, no fatty breakfasts     D level of 32 -up from 30 She takes 1000 iu of vit D and will inc to 2000 iu    Anemia  Lab Results  Component Value Date   WBC 6.3 09/16/2013   HGB 11.9* 09/16/2013   HCT 35.6* 09/16/2013   MCV 92.4 09/16/2013   PLT 218.0 09/16/2013   colonosc 1/15- that was ok  Recall 10 y -no polyps  Does not donate blood    Had vertigo in Jan  Also had eye surg for retina Is finally getting back to normal  Patient Active Problem List   Diagnosis Date Noted  . Palpitations 03/03/2013  . Colon cancer screening 03/03/2013  . Hypokalemia 10/09/2011  . Routine general medical examination at a health care facility 10/02/2011  . Nasal congestion 04/10/2011  . Vitamin D deficiency disease 12/28/2010  . ARTHRALGIA 08/19/2007  . HYPOTHYROIDISM 01/31/2007  . HYPERLIPIDEMIA 01/31/2007  . ANXIETY 01/31/2007  . HYPERTENSION 01/31/2007  . GERD 01/31/2007   Past Medical History  Diagnosis Date  . Anxiety   . GERD (gastroesophageal reflux disease)   . Hyperlipidemia   . Hypertension   . Hypothyroidism   . Vitamin D deficiency    Past Surgical History  Procedure Laterality Date  . Vein ligation and stripping      x2  . Tubal ligation    . Leep    .  Colonoscopy     History  Substance Use Topics  . Smoking status: Former Smoker    Quit date: 04/24/1996  . Smokeless tobacco: Never Used  . Alcohol Use: Yes     Comment: occ. (maybe every 3 mos.)   Family History  Problem Relation Age of Onset  . Transient ischemic attack Mother   . Hypertension Mother   . Hypertension Father   . Colon cancer Neg Hx   . Esophageal cancer Neg Hx   . Rectal cancer Neg Hx   . Stomach cancer Neg Hx    Allergies  Allergen Reactions  . Amlodipine Besylate     REACTION: edema  . Atorvastatin     REACTION: muscle pain  . Neomycin-Bacitracin Zn-Polymyx     REACTION: rash Per pt not sure if from meds or not   Current Outpatient Prescriptions on File Prior to Visit  Medication Sig Dispense Refill  . aspirin 81 MG tablet Take 81 mg by mouth daily.      Marland Kitchen. buPROPion (WELLBUTRIN XL) 150 MG 24 hr tablet Take 1 tablet (150 mg total) by mouth daily.  90 tablet  3  . calcium carbonate (TUMS EX) 750 MG chewable tablet Chew 2 tablets by mouth at  bedtime.      . Calcium Citrate-Vitamin D 1000-400 LIQD Take 2 tablets by mouth daily.      . Cholecalciferol (VITAMIN D) 1000 UNITS capsule Take 1,000 Units by mouth daily.      Marland Kitchen. estradiol (ESTRACE) 0.1 MG/GM vaginal cream Use 2 times a week as directed (about 2 cm in applicator).       . fluticasone (FLONASE) 50 MCG/ACT nasal spray USE 2 SPRAYS INTO EACH NOSTRIL DAILY.  16 g  2  . hydrochlorothiazide (HYDRODIURIL) 50 MG tablet Take 1 tablet (50 mg total) by mouth daily.  90 tablet  3  . levothyroxine (SYNTHROID, LEVOTHROID) 50 MCG tablet Take 1 tablet (50 mcg total) by mouth daily before breakfast.  90 tablet  3  . losartan (COZAAR) 100 MG tablet Take 1 tablet (100 mg total) by mouth daily.  90 tablet  3  . pantoprazole (PROTONIX) 40 MG tablet Take 1 tablet (40 mg total) by mouth daily.  90 tablet  1  . RESTASIS 0.05 % ophthalmic emulsion Place 1 drop into both eyes Twice daily.      . sertraline (ZOLOFT) 100 MG tablet Take 1 tablet (100 mg total) by mouth daily.  90 tablet  1  . simvastatin (ZOCOR) 40 MG tablet Take 1 tablet (40 mg total) by mouth at bedtime.  90 tablet  3   No current facility-administered medications on file prior to visit.      Review of Systems Review of Systems  Constitutional: Negative for fever, appetite change, fatigue and unexpected weight change.  Eyes: Negative for pain and visual disturbance.  Respiratory: Negative for cough and shortness of breath.   Cardiovascular: Negative for cp or palpitations    Gastrointestinal: Negative for nausea, diarrhea and constipation. neg for blood in stool or abd pain  Genitourinary: Negative for urgency and frequency.  Skin: Negative for pallor or rash   Neurological: Negative for weakness, light-headedness, numbness and headaches. occ brief vertigo that is improved  Hematological: Negative for adenopathy. Does not bruise/bleed easily.  Psychiatric/Behavioral: Negative for dysphoric mood. The patient is not  nervous/anxious.         Objective:   Physical Exam  Constitutional: She appears well-developed and well-nourished. No distress.  obese and well appearing   HENT:  Head: Normocephalic and atraumatic.  Mouth/Throat: Oropharynx is clear and moist.  Eyes: Conjunctivae and EOM are normal. Pupils are equal, round, and reactive to light. No scleral icterus.  Neck: Normal range of motion. Neck supple. No JVD present. No thyromegaly present.  Cardiovascular: Normal rate, regular rhythm, normal heart sounds and intact distal pulses.  Exam reveals no gallop.   Pulmonary/Chest: Effort normal and breath sounds normal. No respiratory distress. She has no wheezes. She has no rales.  Abdominal: Soft. Bowel sounds are normal. She exhibits no distension and no mass. There is no tenderness.  Musculoskeletal: She exhibits no edema and no tenderness.  Lymphadenopathy:    She has no cervical adenopathy.  Neurological: She is alert. She has normal reflexes. No cranial nerve deficit. She exhibits normal muscle tone. Coordination normal.  Skin: Skin is warm and dry. No rash noted. No erythema. No pallor.  Psychiatric: She has a normal mood and affect.          Assessment & Plan:   Problem List Items Addressed This Visit     Cardiovascular and Mediastinum   HYPERTENSION - Primary      bp in fair control at this time  BP Readings from Last 1 Encounters:  09/24/13 112/76   No changes needed Disc lifstyle change with low sodium diet and exercise  Lab reviewed  Needs K suppl with the hctz     Relevant Medications      simvastatin (ZOCOR) tablet     Other   HYPERLIPIDEMIA     Disc goals for lipids and reasons to control them Rev labs with pt Rev low sat fat diet in detail LDL went up and need better control Inc zocor to 80 mg and update if any side eff  Lab in 6-8 weeks  Diet info given    Relevant Medications      simvastatin (ZOCOR) tablet   Other Relevant Orders      ALT      AST       Lipid panel   Vitamin D deficiency disease     D in 30s  Vitamin D level is therapeutic but not optimal with current supplementation inst to inc dose of otc D3 to 2000 iu daily  Has been on high dose tx in the past  Disc importance of this to bone and overall health     Hypokalemia     Pt does not tol Klor con pills Will try packets taken with food  Update if not tolerated  Consider different diuretic (K spraring) if we cannot suppl K  Labs planned     Relevant Orders      Potassium   Anemia     Extremely mild No symptoms Recent clear colonosc  Will continue to obs    Relevant Orders      CBC with Differential      Ferritin

## 2013-09-25 ENCOUNTER — Telehealth: Payer: Self-pay | Admitting: Family Medicine

## 2013-09-25 NOTE — Telephone Encounter (Signed)
Relevant patient education assigned to patient using Emmi. ° °

## 2013-09-25 NOTE — Assessment & Plan Note (Signed)
D in 30s  Vitamin D level is therapeutic but not optimal with current supplementation inst to inc dose of otc D3 to 2000 iu daily  Has been on high dose tx in the past  Disc importance of this to bone and overall health

## 2013-09-25 NOTE — Assessment & Plan Note (Signed)
bp in fair control at this time  BP Readings from Last 1 Encounters:  09/24/13 112/76   No changes needed Disc lifstyle change with low sodium diet and exercise  Lab reviewed  Needs K suppl with the hctz

## 2013-09-25 NOTE — Assessment & Plan Note (Signed)
Disc goals for lipids and reasons to control them Rev labs with pt Rev low sat fat diet in detail LDL went up and need better control Inc zocor to 80 mg and update if any side eff  Lab in 6-8 weeks  Diet info given

## 2013-09-25 NOTE — Assessment & Plan Note (Signed)
Pt does not tol Klor con pills Will try packets taken with food  Update if not tolerated  Consider different diuretic (K spraring) if we cannot suppl K  Labs planned

## 2013-09-25 NOTE — Assessment & Plan Note (Signed)
Extremely mild No symptoms Recent clear colonosc  Will continue to obs

## 2013-09-30 ENCOUNTER — Other Ambulatory Visit: Payer: Self-pay | Admitting: *Deleted

## 2013-10-10 ENCOUNTER — Telehealth: Payer: Self-pay | Admitting: *Deleted

## 2013-10-10 NOTE — Telephone Encounter (Signed)
Received notification from [employer] that Simvastatin 80mg  is not covered. Dr. Milinda Antisower responded with note that she had increased dose due to poor control and intolerance of atorvastatin. The responded that they will cover pravastatin. Please advise.

## 2013-10-11 NOTE — Telephone Encounter (Signed)
See about getting the PA started as pravastatin will likely not work as well at simvastatin.  Thanks.

## 2013-10-20 NOTE — Telephone Encounter (Signed)
Has anyone taken care of this?

## 2013-10-20 NOTE — Telephone Encounter (Signed)
Dr. Milinda Antisower had already responded via fax on this medication, Simvastatin 80 mg is approved and we didn't need to do a PA, pt has already received her medication through her mail order pharmacy

## 2013-11-03 ENCOUNTER — Other Ambulatory Visit (INDEPENDENT_AMBULATORY_CARE_PROVIDER_SITE_OTHER): Payer: Managed Care, Other (non HMO)

## 2013-11-03 DIAGNOSIS — E876 Hypokalemia: Secondary | ICD-10-CM

## 2013-11-03 DIAGNOSIS — E785 Hyperlipidemia, unspecified: Secondary | ICD-10-CM

## 2013-11-03 DIAGNOSIS — D649 Anemia, unspecified: Secondary | ICD-10-CM

## 2013-11-03 LAB — CBC WITH DIFFERENTIAL/PLATELET
BASOS PCT: 0.3 % (ref 0.0–3.0)
Basophils Absolute: 0 10*3/uL (ref 0.0–0.1)
EOS ABS: 0.3 10*3/uL (ref 0.0–0.7)
Eosinophils Relative: 5 % (ref 0.0–5.0)
HEMATOCRIT: 36.4 % (ref 36.0–46.0)
HEMOGLOBIN: 12.1 g/dL (ref 12.0–15.0)
Lymphocytes Relative: 32.1 % (ref 12.0–46.0)
Lymphs Abs: 1.9 10*3/uL (ref 0.7–4.0)
MCHC: 33.3 g/dL (ref 30.0–36.0)
MCV: 92.1 fl (ref 78.0–100.0)
Monocytes Absolute: 0.4 10*3/uL (ref 0.1–1.0)
Monocytes Relative: 7.1 % (ref 3.0–12.0)
NEUTROS ABS: 3.3 10*3/uL (ref 1.4–7.7)
Neutrophils Relative %: 55.5 % (ref 43.0–77.0)
Platelets: 248 10*3/uL (ref 150.0–400.0)
RBC: 3.95 Mil/uL (ref 3.87–5.11)
RDW: 13.9 % (ref 11.5–15.5)
WBC: 5.9 10*3/uL (ref 4.0–10.5)

## 2013-11-04 LAB — LIPID PANEL
CHOL/HDL RATIO: 4
Cholesterol: 216 mg/dL — ABNORMAL HIGH (ref 0–200)
HDL: 50.8 mg/dL (ref >39.00)
LDL Cholesterol: 103 mg/dL — ABNORMAL HIGH (ref 0–99)
NonHDL: 165.2
TRIGLYCERIDES: 309 mg/dL — AB (ref 0.0–149.0)
VLDL: 61.8 mg/dL — ABNORMAL HIGH (ref 0.0–40.0)

## 2013-11-04 LAB — FERRITIN: Ferritin: 60.9 ng/mL (ref 10.0–291.0)

## 2013-11-04 LAB — AST: AST: 26 U/L (ref 0–37)

## 2013-11-04 LAB — POTASSIUM: Potassium: 3.5 mEq/L (ref 3.5–5.1)

## 2013-11-04 LAB — ALT: ALT: 15 U/L (ref 0–35)

## 2013-11-06 ENCOUNTER — Encounter: Payer: Self-pay | Admitting: *Deleted

## 2014-03-12 ENCOUNTER — Other Ambulatory Visit: Payer: Self-pay | Admitting: Family Medicine

## 2014-03-12 NOTE — Telephone Encounter (Signed)
appt has been scheduled and med refilled

## 2014-03-12 NOTE — Telephone Encounter (Signed)
Electronic refill request, please advise  

## 2014-03-12 NOTE — Telephone Encounter (Signed)
Please schedule PE after June 3 and refill until then

## 2014-03-25 ENCOUNTER — Ambulatory Visit (INDEPENDENT_AMBULATORY_CARE_PROVIDER_SITE_OTHER): Payer: Managed Care, Other (non HMO)

## 2014-03-25 DIAGNOSIS — Z23 Encounter for immunization: Secondary | ICD-10-CM

## 2014-09-02 ENCOUNTER — Other Ambulatory Visit: Payer: Self-pay | Admitting: Family Medicine

## 2014-09-21 ENCOUNTER — Telehealth: Payer: Self-pay | Admitting: Family Medicine

## 2014-09-21 DIAGNOSIS — Z0000 Encounter for general adult medical examination without abnormal findings: Secondary | ICD-10-CM

## 2014-09-21 DIAGNOSIS — E559 Vitamin D deficiency, unspecified: Secondary | ICD-10-CM

## 2014-09-21 NOTE — Telephone Encounter (Signed)
-----   Message from Dianne Dunalia M Aron, MD sent at 09/18/2014  9:52 AM EDT ----- Regarding: FW: cpx labs Tues 5/31, need orders please:-) Forward to PCP ----- Message -----    From: Baldomero LamyNatasha C Chavers    Sent: 09/17/2014   9:13 AM      To: Dianne Dunalia M Aron, MD Subject: cpx labs Tues 5/31, need orders please:-)      Please order  future cpx labs for pt's upcoming lab appt. Thanks Rodney Boozeasha

## 2014-09-22 ENCOUNTER — Other Ambulatory Visit (INDEPENDENT_AMBULATORY_CARE_PROVIDER_SITE_OTHER): Payer: Managed Care, Other (non HMO)

## 2014-09-22 DIAGNOSIS — E559 Vitamin D deficiency, unspecified: Secondary | ICD-10-CM

## 2014-09-22 DIAGNOSIS — R7989 Other specified abnormal findings of blood chemistry: Secondary | ICD-10-CM | POA: Diagnosis not present

## 2014-09-22 DIAGNOSIS — Z0000 Encounter for general adult medical examination without abnormal findings: Secondary | ICD-10-CM

## 2014-09-22 LAB — CBC WITH DIFFERENTIAL/PLATELET
BASOS PCT: 0.4 % (ref 0.0–3.0)
Basophils Absolute: 0 10*3/uL (ref 0.0–0.1)
EOS PCT: 5 % (ref 0.0–5.0)
Eosinophils Absolute: 0.3 10*3/uL (ref 0.0–0.7)
HCT: 37.7 % (ref 36.0–46.0)
Hemoglobin: 12.8 g/dL (ref 12.0–15.0)
LYMPHS ABS: 2.4 10*3/uL (ref 0.7–4.0)
Lymphocytes Relative: 38.7 % (ref 12.0–46.0)
MCHC: 33.8 g/dL (ref 30.0–36.0)
MCV: 91.9 fl (ref 78.0–100.0)
MONO ABS: 0.5 10*3/uL (ref 0.1–1.0)
MONOS PCT: 7.2 % (ref 3.0–12.0)
NEUTROS PCT: 48.7 % (ref 43.0–77.0)
Neutro Abs: 3 10*3/uL (ref 1.4–7.7)
Platelets: 251 10*3/uL (ref 150.0–400.0)
RBC: 4.1 Mil/uL (ref 3.87–5.11)
RDW: 13.9 % (ref 11.5–15.5)
WBC: 6.2 10*3/uL (ref 4.0–10.5)

## 2014-09-22 LAB — COMPREHENSIVE METABOLIC PANEL
ALT: 9 U/L (ref 0–35)
AST: 20 U/L (ref 0–37)
Albumin: 4.2 g/dL (ref 3.5–5.2)
Alkaline Phosphatase: 72 U/L (ref 39–117)
BUN: 12 mg/dL (ref 6–23)
CALCIUM: 9.7 mg/dL (ref 8.4–10.5)
CO2: 31 mEq/L (ref 19–32)
CREATININE: 0.95 mg/dL (ref 0.40–1.20)
Chloride: 101 mEq/L (ref 96–112)
GFR: 63.01 mL/min (ref >60.00)
Glucose, Bld: 92 mg/dL (ref 70–99)
Potassium: 3.3 mEq/L — ABNORMAL LOW (ref 3.5–5.1)
Sodium: 139 mEq/L (ref 135–145)
Total Bilirubin: 0.4 mg/dL (ref 0.2–1.2)
Total Protein: 7.4 g/dL (ref 6.0–8.3)

## 2014-09-22 LAB — LIPID PANEL
CHOLESTEROL: 197 mg/dL (ref 0–200)
HDL: 44.4 mg/dL (ref >39.00)
NONHDL: 152.6
TRIGLYCERIDES: 267 mg/dL — AB (ref 0.0–149.0)
Total CHOL/HDL Ratio: 4
VLDL: 53.4 mg/dL — ABNORMAL HIGH (ref 0.0–40.0)

## 2014-09-22 LAB — VITAMIN D 25 HYDROXY (VIT D DEFICIENCY, FRACTURES): VITD: 26.81 ng/mL — ABNORMAL LOW (ref 30.00–100.00)

## 2014-09-22 LAB — TSH: TSH: 3.33 u[IU]/mL (ref 0.35–4.50)

## 2014-09-22 LAB — LDL CHOLESTEROL, DIRECT: Direct LDL: 50 mg/dL

## 2014-09-28 ENCOUNTER — Encounter: Payer: Managed Care, Other (non HMO) | Admitting: Family Medicine

## 2014-10-05 ENCOUNTER — Ambulatory Visit (INDEPENDENT_AMBULATORY_CARE_PROVIDER_SITE_OTHER): Payer: Managed Care, Other (non HMO) | Admitting: Family Medicine

## 2014-10-05 ENCOUNTER — Encounter: Payer: Self-pay | Admitting: Family Medicine

## 2014-10-05 VITALS — BP 136/86 | HR 76 | Temp 98.6°F | Ht 66.5 in | Wt 202.5 lb

## 2014-10-05 DIAGNOSIS — E559 Vitamin D deficiency, unspecified: Secondary | ICD-10-CM | POA: Diagnosis not present

## 2014-10-05 DIAGNOSIS — E785 Hyperlipidemia, unspecified: Secondary | ICD-10-CM | POA: Diagnosis not present

## 2014-10-05 DIAGNOSIS — Z0000 Encounter for general adult medical examination without abnormal findings: Secondary | ICD-10-CM

## 2014-10-05 DIAGNOSIS — I10 Essential (primary) hypertension: Secondary | ICD-10-CM

## 2014-10-05 DIAGNOSIS — E876 Hypokalemia: Secondary | ICD-10-CM

## 2014-10-05 DIAGNOSIS — E038 Other specified hypothyroidism: Secondary | ICD-10-CM

## 2014-10-05 DIAGNOSIS — E669 Obesity, unspecified: Secondary | ICD-10-CM

## 2014-10-05 MED ORDER — HYDROCHLOROTHIAZIDE 50 MG PO TABS: 50.0000 mg | ORAL_TABLET | Freq: Every day | ORAL | Status: DC

## 2014-10-05 MED ORDER — LEVOTHYROXINE SODIUM 50 MCG PO TABS: 50.0000 ug | ORAL_TABLET | Freq: Every day | ORAL | Status: DC

## 2014-10-05 MED ORDER — PANTOPRAZOLE SODIUM 40 MG PO TBEC: 40.0000 mg | DELAYED_RELEASE_TABLET | Freq: Every day | ORAL | Status: DC

## 2014-10-05 MED ORDER — BUPROPION HCL ER (XL) 150 MG PO TB24: 150.0000 mg | ORAL_TABLET | Freq: Every day | ORAL | Status: DC

## 2014-10-05 MED ORDER — SERTRALINE HCL 100 MG PO TABS: 100.0000 mg | ORAL_TABLET | Freq: Every day | ORAL | Status: DC

## 2014-10-05 MED ORDER — LOSARTAN POTASSIUM 100 MG PO TABS: 100.0000 mg | ORAL_TABLET | Freq: Every day | ORAL | Status: DC

## 2014-10-05 MED ORDER — POTASSIUM CHLORIDE CRYS ER 10 MEQ PO TBCR: 10.0000 meq | EXTENDED_RELEASE_TABLET | Freq: Every day | ORAL | Status: DC

## 2014-10-05 MED ORDER — SIMVASTATIN 80 MG PO TABS: 80.0000 mg | ORAL_TABLET | Freq: Every day | ORAL | Status: DC

## 2014-10-05 NOTE — Assessment & Plan Note (Signed)
bp in fair control at this time  BP Readings from Last 1 Encounters:  10/05/14 136/86   No changes needed Disc lifstyle change with low sodium diet and exercise  Labs reviewed

## 2014-10-05 NOTE — Progress Notes (Signed)
Pre visit review using our clinic review tool, if applicable. No additional management support is needed unless otherwise documented below in the visit note. 

## 2014-10-05 NOTE — Progress Notes (Signed)
Subjective:    Patient ID: Sharon [name], female    DOB: 1951-04-03, 64 y.o.   MRN: [id number]  HPI Here for health maintenance exam and to review chronic medical problems    Dealing with a diffuse rash / seeing dermatology- bx did not give her a dx  Going on for 51 y , comes and goes  Not treated with anything    Wt is up 1 lb with bmi of 32 Does not eat as well as she should Walking and using a rowing machine   Hep C/HIV screening - low risk , and declines   Mammogram was within the past year - goes every year - to [facility]    Pap also within the last year-gyn  Still using estogen vaginal cream and it helps  Hx of LEEP in the past  Pap smears have been normal    Flu shot 12/15  Td 6/12  Colonoscopy screening 1/15 Nl - 10 year recall  Has some bleeding ever since colonoscopy- ? Hemorrhoids - not painful   Still needs protonix - tried weaning and it did not work   Zoster vaccine 12/14  K is slt low    Chemistry      Component Value Date/Time   NA 139 09/22/2014 0920   K 3.3* 09/22/2014 0920   CL 101 09/22/2014 0920   CO2 31 09/22/2014 0920   BUN 12 09/22/2014 0920   CREATININE 0.95 09/22/2014 0920      Component Value Date/Time   CALCIUM 9.7 09/22/2014 0920   ALKPHOS 72 09/22/2014 0920   AST 20 09/22/2014 0920   ALT 9 09/22/2014 0920   BILITOT 0.4 09/22/2014 0920     is on hctz  Supposed to be on 20 meq daily of K - she does not always take it   bp is stable today  No cp or palpitations or headaches or edema  No side effects to medicines  BP Readings from Last 3 Encounters:  10/05/14 136/86  09/24/13 112/76  05/19/13 106/74     Hypothyroidism  Pt has no clinical changes No change in energy level/ hair or skin/ edema and no tremor Lab Results  Component Value Date   TSH 3.33 09/22/2014      Hyperlipidemia Lab Results  Component Value Date   CHOL 197 09/22/2014   CHOL 216* 11/03/2013   CHOL 297* 09/16/2013   Lab Results  Component  Value Date   HDL 44.40 09/22/2014   HDL 50.80 11/03/2013   HDL 51.50 09/16/2013   Lab Results  Component Value Date   LDLCALC 103* 11/03/2013   LDLCALC 160* 09/16/2013   LDLCALC 86 05/09/2006   Lab Results  Component Value Date   TRIG 267.0* 09/22/2014   TRIG 309.0* 11/03/2013   TRIG 427.0* 09/16/2013   Lab Results  Component Value Date   CHOLHDL 4 09/22/2014   CHOLHDL 4 11/03/2013   CHOLHDL 6 09/16/2013   Lab Results  Component Value Date   LDLDIRECT 50.0 09/22/2014   LDLDIRECT 118.3 06/03/2013   LDLDIRECT 108.5 02/26/2013   zocor and diet  Doing well overall  She does eat red meat /loves it  Avoids fried foods for the most part   Vitamin D is low at 26.8 Taking her vit d every other day   Patient Active Problem List   Diagnosis Date Noted  . Anemia 09/24/2013  . Palpitations 03/03/2013  . Colon cancer screening 03/03/2013  . Hypokalemia 10/09/2011  . Routine  general medical examination at a health care facility 10/02/2011  . Nasal congestion 04/10/2011  . Vitamin D deficiency disease 12/28/2010  . ARTHRALGIA 08/19/2007  . HYPOTHYROIDISM 01/31/2007  . Hyperlipidemia 01/31/2007  . ANXIETY 01/31/2007  . Essential hypertension 01/31/2007  . GERD 01/31/2007   Past Medical History  Diagnosis Date  . Anxiety   . GERD (gastroesophageal reflux disease)   . Hyperlipidemia   . Hypertension   . Hypothyroidism   . Vitamin D deficiency    Past Surgical History  Procedure Laterality Date  . Vein ligation and stripping      x2  . Tubal ligation    . Leep    . Colonoscopy     History  Substance Use Topics  . Smoking status: Former Smoker    Quit date: 04/24/1996  . Smokeless tobacco: Never Used  . Alcohol Use: 0.0 oz/week    0 Standard drinks or equivalent per week     Comment: occ. (maybe every 3 mos.)   Family History  Problem Relation Age of Onset  . Transient ischemic attack Mother   . Hypertension Mother   . Hypertension Father   . Colon  cancer Neg Hx   . Esophageal cancer Neg Hx   . Rectal cancer Neg Hx   . Stomach cancer Neg Hx    Allergies  Allergen Reactions  . Amlodipine Besylate     REACTION: edema  . Atorvastatin     REACTION: muscle pain  . Neomycin-Bacitracin Zn-Polymyx     REACTION: rash Per pt not sure if from meds or not   Current Outpatient Prescriptions on File Prior to Visit  Medication Sig Dispense Refill  . aspirin 81 MG tablet Take 81 mg by mouth daily.    Marland Kitchen buPROPion (WELLBUTRIN XL) 150 MG 24 hr tablet TAKE 1 TABLET DAILY 90 tablet 2  . calcium carbonate (TUMS EX) 750 MG chewable tablet Chew 2 tablets by mouth at bedtime.    . Calcium Citrate-Vitamin D 1000-400 LIQD Take 2 tablets by mouth every other day.     . Cholecalciferol (VITAMIN D) 1000 UNITS capsule Take 2,000 Units by mouth every other day.     . estradiol (ESTRACE) 0.1 MG/GM vaginal cream Use 2 times a week as directed (about 2 cm in applicator).     . fluticasone (FLONASE) 50 MCG/ACT nasal spray USE 2 SPRAYS INTO EACH NOSTRIL DAILY. 16 g 2  . hydrochlorothiazide (HYDRODIURIL) 50 MG tablet TAKE 1 TABLET DAILY 90 tablet 2  . levothyroxine (SYNTHROID, LEVOTHROID) 50 MCG tablet TAKE 1 TABLET DAILY BEFORE BREAKFAST 90 tablet 2  . losartan (COZAAR) 100 MG tablet TAKE 1 TABLET DAILY 90 tablet 2  . pantoprazole (PROTONIX) 40 MG tablet TAKE 1 TABLET BY MOUTH DAILY 90 tablet 2  . RESTASIS 0.05 % ophthalmic emulsion Place 1 drop into both eyes Twice daily.    . sertraline (ZOLOFT) 100 MG tablet TAKE 1 TABLET BY MOUTH DAILY 90 tablet 2  . simvastatin (ZOCOR) 80 MG tablet TAKE 1 TABLET BY MOUTH DAILY 90 tablet 1   No current facility-administered medications on file prior to visit.        Review of Systems Review of Systems  Constitutional: Negative for fever, appetite change, fatigue and unexpected weight change.  Eyes: Negative for pain and visual disturbance.  Respiratory: Negative for cough and shortness of breath.   Cardiovascular:  Negative for cp or palpitations    Gastrointestinal: Negative for nausea, diarrhea and constipation.  Genitourinary: Negative  for urgency and frequency.  Skin: Negative for pallor or rash   Neurological: Negative for weakness, light-headedness, numbness and headaches.  Hematological: Negative for adenopathy. Does not bruise/bleed easily.  Psychiatric/Behavioral: Negative for dysphoric mood. The patient is not nervous/anxious.         Objective:   Physical Exam  Constitutional: She appears well-developed and well-nourished. No distress.  obese and well appearing   HENT:  Head: Normocephalic and atraumatic.  Right Ear: External ear normal.  Left Ear: External ear normal.  Nose: Nose normal.  Mouth/Throat: Oropharynx is clear and moist.  Eyes: Conjunctivae and EOM are normal. Pupils are equal, round, and reactive to light. Right eye exhibits no discharge. Left eye exhibits no discharge. No scleral icterus.  Neck: Normal range of motion. Neck supple. No JVD present. Carotid bruit is not present. No thyromegaly present.  Cardiovascular: Normal rate, regular rhythm, normal heart sounds and intact distal pulses.  Exam reveals no gallop.   Pulmonary/Chest: Effort normal and breath sounds normal. No respiratory distress. She has no wheezes. She has no rales.  Abdominal: Soft. Bowel sounds are normal. She exhibits no distension and no mass. There is no tenderness.  Musculoskeletal: She exhibits no edema or tenderness.  Lymphadenopathy:    She has no cervical adenopathy.  Neurological: She is alert. She has normal reflexes. No cranial nerve deficit. She exhibits normal muscle tone. Coordination normal.  Skin: Skin is warm and dry. No rash noted. No erythema. No pallor.  Rash on L arm - papular and dry  Psychiatric: She has a normal mood and affect.          Assessment & Plan:   Problem List Items Addressed This Visit    Essential hypertension - Primary    bp in fair control at this  time  BP Readings from Last 1 Encounters:  10/05/14 136/86   No changes needed Disc lifstyle change with low sodium diet and exercise  Labs reviewed       Relevant Medications   hydrochlorothiazide (HYDRODIURIL) 50 MG tablet   losartan (COZAAR) 100 MG tablet   simvastatin (ZOCOR) 80 MG tablet   Hyperlipidemia    Disc goals for lipids and reasons to control them Rev labs with pt Rev low sat fat diet in detail Continue zocor at 80 mg (pt tolerates the dose well for years)      Relevant Medications   hydrochlorothiazide (HYDRODIURIL) 50 MG tablet   losartan (COZAAR) 100 MG tablet   simvastatin (ZOCOR) 80 MG tablet   Hypokalemia    Lab Results  Component Value Date   K 3.3* 09/22/2014   Urged to be more compliant with K dosing       Hypothyroidism    Hypothyroidism  Pt has no clinical changes No change in energy level/ hair or skin/ edema and no tremor Lab Results  Component Value Date   TSH 3.33 09/22/2014          Relevant Medications   levothyroxine (SYNTHROID, LEVOTHROID) 50 MCG tablet   Routine general medical examination at a health care facility    Reviewed health habits including diet and exercise and skin cancer prevention Reviewed appropriate screening tests for age  Also reviewed health mt list, fam hx and immunization status , as well as social and family history   See HPI Labs reviewed       Vitamin D deficiency disease    D level in 46s Not compliant with dosing  Will inc to 2000  iu daily  Urged imp to bone and overall health

## 2014-10-05 NOTE — Assessment & Plan Note (Signed)
Discussed how this problem influences overall health and the risks it imposes  Reviewed plan for weight loss with lower calorie diet (via better food choices and also portion control or program like weight watchers) and exercise building up to or more than 30 minutes 5 days per week including some aerobic activity    

## 2014-10-05 NOTE — Assessment & Plan Note (Signed)
Reviewed health habits including diet and exercise and skin cancer prevention Reviewed appropriate screening tests for age  Also reviewed health mt list, fam hx and immunization status , as well as social and family history   See HPI Labs reviewed  

## 2014-10-05 NOTE — Assessment & Plan Note (Signed)
Hypothyroidism  Pt has no clinical changes No change in energy level/ hair or skin/ edema and no tremor Lab Results  Component Value Date   TSH 3.33 09/22/2014

## 2014-10-05 NOTE — Patient Instructions (Signed)
Get back on your potassium daily (you can also split a pill and take 1/2 pill twice daily Also get back on vitamin D every day (take 2000 iu every day) in addition to your calcium plus D Other labs are stable  Keep exercising

## 2014-10-05 NOTE — Assessment & Plan Note (Signed)
D level in 20s Not compliant with dosing  Will inc to 2000 iu daily  Urged imp to bone and overall health

## 2014-10-05 NOTE — Assessment & Plan Note (Signed)
Lab Results  Component Value Date   K 3.3* 09/22/2014   Urged to be more compliant with K dosing

## 2014-10-05 NOTE — Assessment & Plan Note (Signed)
Disc goals for lipids and reasons to control them Rev labs with pt Rev low sat fat diet in detail Continue zocor at 80 mg (pt tolerates the dose well for years)

## 2014-11-25 ENCOUNTER — Ambulatory Visit (INDEPENDENT_AMBULATORY_CARE_PROVIDER_SITE_OTHER)
Admission: RE | Admit: 2014-11-25 | Discharge: 2014-11-25 | Disposition: A | Discharge status: Home / Self Care | Payer: Managed Care, Other (non HMO) | Source: Ambulatory Visit | Attending: Primary Care | Admitting: Primary Care

## 2014-11-25 ENCOUNTER — Ambulatory Visit (INDEPENDENT_AMBULATORY_CARE_PROVIDER_SITE_OTHER): Payer: Managed Care, Other (non HMO) | Admitting: Primary Care

## 2014-11-25 ENCOUNTER — Encounter: Payer: Self-pay | Admitting: Primary Care

## 2014-11-25 ENCOUNTER — Other Ambulatory Visit: Payer: Self-pay | Admitting: Primary Care

## 2014-11-25 VITALS — BP 118/72 | HR 74 | Temp 97.9°F | Ht 66.5 in | Wt 202.8 lb

## 2014-11-25 DIAGNOSIS — M25512 Pain in left shoulder: Secondary | ICD-10-CM | POA: Diagnosis not present

## 2014-11-25 NOTE — Progress Notes (Signed)
Subjective:    Patient ID: Sharon [name], female    DOB: [birth year], 64 y.o.   MRN: [id number]  HPI  Sharon [name] is a 64 year old female who presents today with a chief complaint of arm pain since falling. She fell on 11/04/2014 after walking across a loose porch board, landing with her leg going through the porch and hitting her left shoulder. Her left leg was injured but is improving; however, her left shoulder continues to be painful. She has taken a few days worth of Aleve initially after the accident with some relief, but has not taken in several weeks. Denies numbness/tingling.  Review of Systems  Respiratory: Negative for shortness of breath.   Cardiovascular: Negative for chest pain.  Musculoskeletal:       Left shoulder pain.  Skin: Positive for wound.  Neurological: Negative for dizziness and headaches.       Past Medical History  Diagnosis Date  . Anxiety   . GERD (gastroesophageal reflux disease)   . Hyperlipidemia   . Hypertension   . Hypothyroidism   . Vitamin D deficiency     History   Social History  . Marital Status: Married    Spouse Name: N/A  . Number of Children: N/A  . Years of Education: N/A   Occupational History  . Not on file.   Social History Main Topics  . Smoking status: Former Smoker    Quit date: 04/24/1996  . Smokeless tobacco: Never Used  . Alcohol Use: 0.0 oz/week    0 Standard drinks or equivalent per week     Comment: occ. (maybe every 3 mos.)  . Drug Use: No  . Sexual Activity: Not on file   Other Topics Concern  . Not on file   Social History Narrative    Past Surgical History  Procedure Laterality Date  . Vein ligation and stripping      x2  . Tubal ligation    . Leep    . Colonoscopy      Family History  Problem Relation Age of Onset  . Transient ischemic attack Mother   . Hypertension Mother   . Hypertension Father   . Colon cancer Neg Hx   . Esophageal cancer Neg Hx   . Rectal cancer Neg Hx   .  Stomach cancer Neg Hx     Allergies  Allergen Reactions  . Amlodipine Besylate     REACTION: edema  . Atorvastatin     REACTION: muscle pain  . Neomycin-Bacitracin Zn-Polymyx     REACTION: rash Per pt not sure if from meds or not    Current Outpatient Prescriptions on File Prior to Visit  Medication Sig Dispense Refill  . aspirin 81 MG tablet Take 81 mg by mouth daily.    Marland Kitchen buPROPion (WELLBUTRIN XL) 150 MG 24 hr tablet Take 1 tablet (150 mg total) by mouth daily. 90 tablet 3  . calcium carbonate (TUMS EX) 750 MG chewable tablet Chew 2 tablets by mouth at bedtime.    . Calcium Citrate-Vitamin D 1000-400 LIQD Take 2 tablets by mouth every other day.     . Cholecalciferol (VITAMIN D) 1000 UNITS capsule Take 2,000 Units by mouth every other day.     . estradiol (ESTRACE) 0.1 MG/GM vaginal cream Use 2 times a week as directed (about 2 cm in applicator).     . fluticasone (FLONASE) 50 MCG/ACT nasal spray USE 2 SPRAYS INTO EACH NOSTRIL DAILY. 16 g 2  .  hydrochlorothiazide (HYDRODIURIL) 50 MG tablet Take 1 tablet (50 mg total) by mouth daily. 90 tablet 3  . levothyroxine (SYNTHROID, LEVOTHROID) 50 MCG tablet Take 1 tablet (50 mcg total) by mouth daily before breakfast. 90 tablet 3  . losartan (COZAAR) 100 MG tablet Take 1 tablet (100 mg total) by mouth daily. 90 tablet 3  . pantoprazole (PROTONIX) 40 MG tablet Take 1 tablet (40 mg total) by mouth daily. 90 tablet 3  . potassium chloride (K-DUR,KLOR-CON) 10 MEQ tablet Take 1 tablet (10 mEq total) by mouth daily. With food 30 tablet 11  . RESTASIS 0.05 % ophthalmic emulsion Place 1 drop into both eyes Twice daily.    . sertraline (ZOLOFT) 100 MG tablet Take 1 tablet (100 mg total) by mouth daily. 90 tablet 3  . simvastatin (ZOCOR) 80 MG tablet Take 1 tablet (80 mg total) by mouth daily. 90 tablet 3   No current facility-administered medications on file prior to visit.    BP 118/72 mmHg  Pulse 74  Temp(Src) 97.9 F (36.6 C) (Oral)  Ht 5'  6.5" (1.689 m)  Wt 202 lb 12.8 oz (91.989 kg)  BMI 32.25 kg/m2  SpO2 98%    Objective:   Physical Exam  Constitutional: She appears well-nourished.  Cardiovascular: Normal rate and regular rhythm.   Pulmonary/Chest: Effort normal and breath sounds normal.  Musculoskeletal:       Left shoulder: She exhibits decreased range of motion and pain. She exhibits no tenderness.       Left upper arm: She exhibits tenderness. She exhibits no swelling.  Skin: Skin is warm and dry.          Assessment & Plan:  Shoulder pain:  Present to left side s/p fall on 7/13. Pain to left deltoid and left shoulder joint. No obvious deformity or bruising. No laxity noted to joint. Decrease ROM to left shoulder due to pain. Start ibuprofen or aleve for pain. Ice, rest. Xray today for further evaluation. Follow up in 2 weeks if no improvement.

## 2014-11-25 NOTE — Progress Notes (Signed)
Pre visit review using our clinic review tool, if applicable. No additional management support is needed unless otherwise documented below in the visit note. 

## 2014-11-25 NOTE — Patient Instructions (Addendum)
Start taking ibuprofen 600 mg three times daily as needed for shoulder pain, or you may take aleve as indicated on the package directions.  Complete xray(s) prior to leaving today. I will contact you regarding your results.  It was a pleasure meeting you!  Shoulder Pain The shoulder is the joint that connects your arms to your body. The bones that form the shoulder joint include the upper arm bone (humerus), the shoulder blade (scapula), and the collarbone (clavicle). The top of the humerus is shaped like a ball and fits into a rather flat socket on the scapula (glenoid cavity). A combination of muscles and strong, fibrous tissues that connect muscles to bones (tendons) support your shoulder joint and hold the ball in the socket. Small, fluid-filled sacs (bursae) are located in different areas of the joint. They act as cushions between the bones and the overlying soft tissues and help reduce friction between the gliding tendons and the bone as you move your arm. Your shoulder joint allows a wide range of motion in your arm. This range of motion allows you to do things like scratch your back or throw a ball. However, this range of motion also makes your shoulder more prone to pain from overuse and injury. Causes of shoulder pain can originate from both injury and overuse and usually can be grouped in the following four categories:  Redness, swelling, and pain (inflammation) of the tendon (tendinitis) or the bursae (bursitis).  Instability, such as a dislocation of the joint.  Inflammation of the joint (arthritis).  Broken bone (fracture). HOME CARE INSTRUCTIONS   Apply ice to the sore area.  Put ice in a plastic bag.  Place a towel between your skin and the bag.  Leave the ice on for 15-20 minutes, 3-4 times per day for the first 2 days, or as directed by your health care provider.  Stop using cold packs if they do not help with the pain.  If you have a shoulder sling or immobilizer, wear  it as long as your caregiver instructs. Only remove it to shower or bathe. Move your arm as little as possible, but keep your hand moving to prevent swelling.  Squeeze a soft ball or foam pad as much as possible to help prevent swelling.  Only take over-the-counter or prescription medicines for pain, discomfort, or fever as directed by your caregiver. SEEK MEDICAL CARE IF:   Your shoulder pain increases, or new pain develops in your arm, hand, or fingers.  Your hand or fingers become cold and numb.  Your pain is not relieved with medicines. SEEK IMMEDIATE MEDICAL CARE IF:   Your arm, hand, or fingers are numb or tingling.  Your arm, hand, or fingers are significantly swollen or turn white or blue. MAKE SURE YOU:   Understand these instructions.  Will watch your condition.  Will get help right away if you are not doing well or get worse. Document Released: 01/18/2005 Document Revised: 08/25/2013 Document Reviewed: 03/25/2011 [facility] Patient Information 2015 [place], [place]. This information is not intended to replace advice given to you by your health care provider. Make sure you discuss any questions you have with your health care provider.

## 2014-11-27 ENCOUNTER — Telehealth: Payer: Self-pay | Admitting: Primary Care

## 2014-11-27 ENCOUNTER — Other Ambulatory Visit: Payer: Self-pay | Admitting: Primary Care

## 2014-11-27 ENCOUNTER — Ambulatory Visit
Admission: RE | Admit: 2014-11-27 | Discharge: 2014-11-27 | Disposition: A | Discharge status: Home / Self Care | Payer: Managed Care, Other (non HMO) | Source: Ambulatory Visit | Attending: Primary Care | Admitting: Primary Care

## 2014-11-27 DIAGNOSIS — S4292XA Fracture of left shoulder girdle, part unspecified, initial encounter for closed fracture: Secondary | ICD-10-CM

## 2014-11-27 DIAGNOSIS — S42142A Displaced fracture of glenoid cavity of scapula, left shoulder, initial encounter for closed fracture: Secondary | ICD-10-CM | POA: Diagnosis not present

## 2014-11-27 DIAGNOSIS — X58XXXA Exposure to other specified factors, initial encounter: Secondary | ICD-10-CM | POA: Insufficient documentation

## 2014-11-27 DIAGNOSIS — M25512 Pain in left shoulder: Secondary | ICD-10-CM

## 2014-12-17 ENCOUNTER — Other Ambulatory Visit: Payer: Self-pay | Admitting: Family Medicine

## 2014-12-22 ENCOUNTER — Telehealth: Payer: Self-pay

## 2014-12-22 NOTE — Telephone Encounter (Signed)
I left a message to remind patient of being due for a mammogram. I notified patient that if she needed any information on how/where to get a mammogram scheduled, she could return call to the office.

## 2015-05-26 LAB — HM MAMMOGRAPHY: HM Mammogram: NORMAL (ref 0–4)

## 2015-10-08 ENCOUNTER — Encounter: Payer: Self-pay | Admitting: Family Medicine

## 2015-10-11 MED ORDER — LEVOTHYROXINE SODIUM 50 MCG PO TABS: 50.0000 ug | ORAL_TABLET | Freq: Every day | ORAL | Status: DC

## 2015-10-11 MED ORDER — SERTRALINE HCL 100 MG PO TABS: 100.0000 mg | ORAL_TABLET | Freq: Every day | ORAL | Status: DC

## 2015-10-11 MED ORDER — LOSARTAN POTASSIUM 100 MG PO TABS: 100.0000 mg | ORAL_TABLET | Freq: Every day | ORAL | Status: DC

## 2015-10-11 MED ORDER — SIMVASTATIN 80 MG PO TABS: 80.0000 mg | ORAL_TABLET | Freq: Every day | ORAL | Status: DC

## 2015-10-11 MED ORDER — HYDROCHLOROTHIAZIDE 50 MG PO TABS: 50.0000 mg | ORAL_TABLET | Freq: Every day | ORAL | Status: DC

## 2015-10-11 MED ORDER — PANTOPRAZOLE SODIUM 40 MG PO TBEC: 40.0000 mg | DELAYED_RELEASE_TABLET | Freq: Every day | ORAL | Status: DC

## 2015-10-11 MED ORDER — BUPROPION HCL ER (XL) 150 MG PO TB24: 150.0000 mg | ORAL_TABLET | Freq: Every day | ORAL | Status: DC

## 2016-01-07 ENCOUNTER — Other Ambulatory Visit: Payer: Self-pay | Admitting: Family Medicine

## 2016-01-07 NOTE — Telephone Encounter (Signed)
Left voicemail requesting pt to call the office back 

## 2016-01-07 NOTE — Telephone Encounter (Signed)
Please schedule 30 min f/u or PE (her choice) and refill until then

## 2016-01-07 NOTE — Telephone Encounter (Signed)
Pt hasn't been seen in over a year and no future appt., please advise  

## 2016-01-10 ENCOUNTER — Telehealth: Payer: Self-pay

## 2016-01-10 NOTE — Telephone Encounter (Signed)
Med refilled.

## 2016-01-10 NOTE — Telephone Encounter (Signed)
Sharon [name] with [facility] left v/m requesting cb to pt about [facility] Pt has appt on 01/24/16 about medications and refills can be done at that time. Left v/m requesting pt to cb.

## 2016-01-10 NOTE — Telephone Encounter (Signed)
Labs 9/28 Med follow up 10/2 Pt stated she always gets labs prior to see dr tower

## 2016-01-11 NOTE — Telephone Encounter (Signed)
Pt left v/m that she has appt on 01/24/16 and will get refills set [facility] at [facility] at that appt. Nothing further needed at this time.

## 2016-01-16 ENCOUNTER — Telehealth: Payer: Self-pay | Admitting: Family Medicine

## 2016-01-16 DIAGNOSIS — E559 Vitamin D deficiency, unspecified: Secondary | ICD-10-CM

## 2016-01-16 DIAGNOSIS — I10 Essential (primary) hypertension: Secondary | ICD-10-CM

## 2016-01-16 DIAGNOSIS — E785 Hyperlipidemia, unspecified: Secondary | ICD-10-CM

## 2016-01-16 DIAGNOSIS — E038 Other specified hypothyroidism: Secondary | ICD-10-CM

## 2016-01-16 NOTE — Telephone Encounter (Signed)
-----   Message from Alvina Chouerri J Walsh sent at 01/13/2016  4:34 PM EDT ----- Regarding: Lab orders for Thursday, 9.28.17 LAB ORDER FOR F/U APPT

## 2016-01-20 ENCOUNTER — Other Ambulatory Visit (INDEPENDENT_AMBULATORY_CARE_PROVIDER_SITE_OTHER): Payer: Medicare Other

## 2016-01-20 DIAGNOSIS — I10 Essential (primary) hypertension: Secondary | ICD-10-CM

## 2016-01-20 DIAGNOSIS — E038 Other specified hypothyroidism: Secondary | ICD-10-CM | POA: Diagnosis not present

## 2016-01-20 DIAGNOSIS — E559 Vitamin D deficiency, unspecified: Secondary | ICD-10-CM | POA: Diagnosis not present

## 2016-01-20 DIAGNOSIS — E785 Hyperlipidemia, unspecified: Secondary | ICD-10-CM | POA: Diagnosis not present

## 2016-01-20 LAB — COMPREHENSIVE METABOLIC PANEL
ALBUMIN: 4 g/dL (ref 3.5–5.2)
ALT: 10 U/L (ref 0–35)
AST: 21 U/L (ref 0–37)
Alkaline Phosphatase: 66 U/L (ref 39–117)
BUN: 15 mg/dL (ref 6–23)
CHLORIDE: 101 meq/L (ref 96–112)
CO2: 29 mEq/L (ref 19–32)
Calcium: 9.1 mg/dL (ref 8.4–10.5)
Creatinine, Ser: 0.97 mg/dL (ref 0.40–1.20)
GFR: 61.25 mL/min (ref >60.00)
Glucose, Bld: 94 mg/dL (ref 70–99)
Potassium: 3.2 mEq/L — ABNORMAL LOW (ref 3.5–5.1)
Sodium: 139 mEq/L (ref 135–145)
Total Bilirubin: 0.4 mg/dL (ref 0.2–1.2)
Total Protein: 7.2 g/dL (ref 6.0–8.3)

## 2016-01-20 LAB — CBC WITH DIFFERENTIAL/PLATELET
BASOS PCT: 0.6 % (ref 0.0–3.0)
Basophils Absolute: 0 10*3/uL (ref 0.0–0.1)
EOS ABS: 0.3 10*3/uL (ref 0.0–0.7)
Eosinophils Relative: 5.2 % — ABNORMAL HIGH (ref 0.0–5.0)
HCT: 37.2 % (ref 36.0–46.0)
HEMOGLOBIN: 12.7 g/dL (ref 12.0–15.0)
LYMPHS ABS: 2.2 10*3/uL (ref 0.7–4.0)
Lymphocytes Relative: 36 % (ref 12.0–46.0)
MCHC: 34.1 g/dL (ref 30.0–36.0)
MCV: 92.2 fl (ref 78.0–100.0)
MONO ABS: 0.4 10*3/uL (ref 0.1–1.0)
Monocytes Relative: 6.8 % (ref 3.0–12.0)
Neutro Abs: 3.1 10*3/uL (ref 1.4–7.7)
Neutrophils Relative %: 51.4 % (ref 43.0–77.0)
Platelets: 285 10*3/uL (ref 150.0–400.0)
RBC: 4.04 Mil/uL (ref 3.87–5.11)
RDW: 14.2 % (ref 11.5–15.5)
WBC: 6.1 10*3/uL (ref 4.0–10.5)

## 2016-01-20 LAB — VITAMIN D 25 HYDROXY (VIT D DEFICIENCY, FRACTURES): VITD: 29.02 ng/mL — AB (ref 30.00–100.00)

## 2016-01-20 LAB — LIPID PANEL
CHOL/HDL RATIO: 4
CHOLESTEROL: 239 mg/dL — AB (ref 0–200)
HDL: 58.1 mg/dL (ref >39.00)
NonHDL: 180.55
Triglycerides: 226 mg/dL — ABNORMAL HIGH (ref 0.0–149.0)
VLDL: 45.2 mg/dL — ABNORMAL HIGH (ref 0.0–40.0)

## 2016-01-20 LAB — TSH: TSH: 2.92 u[IU]/mL (ref 0.35–4.50)

## 2016-01-20 LAB — LDL CHOLESTEROL, DIRECT: Direct LDL: 77 mg/dL

## 2016-01-21 ENCOUNTER — Other Ambulatory Visit: Payer: Self-pay

## 2016-01-21 MED ORDER — FLUTICASONE PROPIONATE 50 MCG/ACT NA SUSP: NASAL | 0 refills | Status: DC

## 2016-01-21 NOTE — Telephone Encounter (Signed)
Pt left note requesting refill for pantoprazole,losartan,hctz,simvastatin and levothyroxine to [facility]. All these meds were refilled on 01/10/16. Also request refill flonase ; refill done per protocol; pt has appt 01/24/16 at 11:15 with Dr Milinda Antisower. Left v/m requesting pt to cb.

## 2016-01-24 ENCOUNTER — Ambulatory Visit (INDEPENDENT_AMBULATORY_CARE_PROVIDER_SITE_OTHER)
Admission: RE | Admit: 2016-01-24 | Discharge: 2016-01-24 | Disposition: A | Discharge status: Home / Self Care | Payer: Medicare Other | Source: Ambulatory Visit | Attending: Family Medicine | Admitting: Family Medicine

## 2016-01-24 ENCOUNTER — Ambulatory Visit (INDEPENDENT_AMBULATORY_CARE_PROVIDER_SITE_OTHER): Payer: Medicare Other | Admitting: Family Medicine

## 2016-01-24 ENCOUNTER — Encounter: Payer: Self-pay | Admitting: Family Medicine

## 2016-01-24 VITALS — BP 122/78 | HR 79 | Temp 98.4°F | Ht 66.5 in | Wt 205.2 lb

## 2016-01-24 DIAGNOSIS — G8929 Other chronic pain: Secondary | ICD-10-CM

## 2016-01-24 DIAGNOSIS — R002 Palpitations: Secondary | ICD-10-CM | POA: Diagnosis not present

## 2016-01-24 DIAGNOSIS — E038 Other specified hypothyroidism: Secondary | ICD-10-CM

## 2016-01-24 DIAGNOSIS — M25551 Pain in right hip: Secondary | ICD-10-CM | POA: Diagnosis not present

## 2016-01-24 DIAGNOSIS — E782 Mixed hyperlipidemia: Secondary | ICD-10-CM

## 2016-01-24 DIAGNOSIS — Z6832 Body mass index (BMI) 32.0-32.9, adult: Secondary | ICD-10-CM

## 2016-01-24 DIAGNOSIS — I10 Essential (primary) hypertension: Secondary | ICD-10-CM | POA: Diagnosis not present

## 2016-01-24 DIAGNOSIS — M25511 Pain in right shoulder: Secondary | ICD-10-CM

## 2016-01-24 DIAGNOSIS — Z23 Encounter for immunization: Secondary | ICD-10-CM | POA: Diagnosis not present

## 2016-01-24 DIAGNOSIS — K219 Gastro-esophageal reflux disease without esophagitis: Secondary | ICD-10-CM

## 2016-01-24 DIAGNOSIS — E6609 Other obesity due to excess calories: Secondary | ICD-10-CM

## 2016-01-24 DIAGNOSIS — E559 Vitamin D deficiency, unspecified: Secondary | ICD-10-CM

## 2016-01-24 DIAGNOSIS — E876 Hypokalemia: Secondary | ICD-10-CM

## 2016-01-24 MED ORDER — FLUTICASONE PROPIONATE 50 MCG/ACT NA SUSP: NASAL | 11 refills | Status: DC

## 2016-01-24 MED ORDER — PANTOPRAZOLE SODIUM 40 MG PO TBEC: 40.0000 mg | DELAYED_RELEASE_TABLET | Freq: Every day | ORAL | 11 refills | Status: DC

## 2016-01-24 MED ORDER — HYDROCHLOROTHIAZIDE 50 MG PO TABS: 50.0000 mg | ORAL_TABLET | Freq: Every day | ORAL | 11 refills | Status: DC

## 2016-01-24 MED ORDER — LEVOTHYROXINE SODIUM 50 MCG PO TABS: 50.0000 ug | ORAL_TABLET | Freq: Every day | ORAL | 11 refills | Status: DC

## 2016-01-24 MED ORDER — LOSARTAN POTASSIUM 100 MG PO TABS: 100.0000 mg | ORAL_TABLET | Freq: Every day | ORAL | 11 refills | Status: DC

## 2016-01-24 MED ORDER — POTASSIUM CHLORIDE ER 10 MEQ PO TBCR: 10.0000 meq | EXTENDED_RELEASE_TABLET | Freq: Every day | ORAL | 11 refills | Status: DC

## 2016-01-24 MED ORDER — SIMVASTATIN 80 MG PO TABS: 80.0000 mg | ORAL_TABLET | Freq: Every day | ORAL | 11 refills | Status: DC

## 2016-01-24 MED ORDER — SERTRALINE HCL 100 MG PO TABS: 100.0000 mg | ORAL_TABLET | Freq: Every day | ORAL | 11 refills | Status: DC

## 2016-01-24 MED ORDER — BUPROPION HCL ER (XL) 150 MG PO TB24: 150.0000 mg | ORAL_TABLET | Freq: Every day | ORAL | 11 refills | Status: DC

## 2016-01-24 NOTE — Assessment & Plan Note (Signed)
Disc goals for lipids and reasons to control them Rev labs with pt Rev low sat fat diet in detail Stable with simvastatin and diet  

## 2016-01-24 NOTE — Assessment & Plan Note (Signed)
Refilled protonix generic Pt has rebound symptoms if she misses one day

## 2016-01-24 NOTE — Assessment & Plan Note (Signed)
Hypothyroidism  Pt has no clinical changes No change in energy level/ hair or skin/ edema and no tremor Lab Results  Component Value Date   TSH 2.92 01/20/2016

## 2016-01-24 NOTE — Progress Notes (Signed)
Pre visit review using our clinic review tool, if applicable. No additional management support is needed unless otherwise documented below in the visit note. 

## 2016-01-24 NOTE — Assessment & Plan Note (Signed)
Acute on chronic  Enc strongly to wean off caffeine (as recommended last time)-pt is resistant to this  EKG nsr with rate in 70s Disc replacing potassium  If no imp consider ref to cardiology

## 2016-01-24 NOTE — Progress Notes (Signed)
Subjective:    Patient ID: Sharon GrumblingBarbara B Foresta, female    DOB: [birth year], 65 y.o.   MRN: [id number]  HPI Here for f/u of chronic medical conditions  She has some acute issues   She c/o of palpitations occ/ has MVP  Panic attacks are controlled with zoloft  No angina symptoms  Does still drink 2 caff bev per day  EKG today is NSR with rate of 75 -normal rhythm   occ feels dizzy/hum in her ears when she moves her head up and down occ gets dizzy for no reason   R shoulder hurts - with abduction  Since march  Did not injure it that she knows of  It really gets her down  Has not put ice on it  No meds for it   occ R hip pain in groin- it rad to leg  Feels electric  Not a lot of back pain - but occasionally     Wt Readings from Last 3 Encounters:  01/24/16 205 lb 4 oz (93.1 kg)  11/25/14 202 lb 12.8 oz (92 kg)  10/05/14 202 lb 8 oz (91.9 kg)  wt is up 3 lb -not eating healthy or getting enough exercise  bmi is 32.6  bp is stable today  No cp or palpitations or headaches or edema  No side effects to medicines  BP Readings from Last 3 Encounters:  01/24/16 122/78  11/25/14 118/72  10/05/14 136/86     Past hx of low K   Chemistry      Component Value Date/Time   NA 139 01/20/2016 1037   K 3.2 (L) 01/20/2016 1037   CL 101 01/20/2016 1037   CO2 29 01/20/2016 1037   BUN 15 01/20/2016 1037   CREATININE 0.97 01/20/2016 1037      Component Value Date/Time   CALCIUM 9.1 01/20/2016 1037   ALKPHOS 66 01/20/2016 1037   AST 21 01/20/2016 1037   ALT 10 01/20/2016 1037   BILITOT 0.4 01/20/2016 1037     not taking any K right now    Hx of gerd On protonix - does well as long as she does not miss a dose   Hypothyroidism  Pt has no clinical changes No change in energy level/ hair or skin/ edema and no tremor Lab Results  Component Value Date   TSH 2.92 01/20/2016   no symptoms     Hx of low vit D Vit D level is 29.02 Up from 26 She only takes 1 pill daily of  ca plus D  Hx of hyperlipidemia Lab Results  Component Value Date   CHOL 239 (H) 01/20/2016   CHOL 197 09/22/2014   CHOL 216 (H) 11/03/2013   Lab Results  Component Value Date   HDL 58.10 01/20/2016   HDL 44.40 09/22/2014   HDL 50.80 11/03/2013   Lab Results  Component Value Date   LDLCALC 103 (H) 11/03/2013   LDLCALC 160 (H) 09/16/2013   LDLCALC 86 05/09/2006   Lab Results  Component Value Date   TRIG 226.0 (H) 01/20/2016   TRIG 267.0 (H) 09/22/2014   TRIG 309.0 (H) 11/03/2013   Lab Results  Component Value Date   CHOLHDL 4 01/20/2016   CHOLHDL 4 09/22/2014   CHOLHDL 4 11/03/2013   Lab Results  Component Value Date   LDLDIRECT 77.0 01/20/2016   LDLDIRECT 50.0 09/22/2014   LDLDIRECT 118.3 06/03/2013   zocor and diet  Overall fairly stable with better HDL   Patient  Active Problem List   Diagnosis Date Noted  . Right shoulder pain 01/24/2016  . Right hip pain 01/24/2016  . Obesity 10/05/2014  . Anemia 09/24/2013  . Palpitations 03/03/2013  . Colon cancer screening 03/03/2013  . Hypokalemia 10/09/2011  . Routine general medical examination at a health care facility 10/02/2011  . Nasal congestion 04/10/2011  . Vitamin D deficiency disease 12/28/2010  . ARTHRALGIA 08/19/2007  . Hypothyroidism 01/31/2007  . Hyperlipidemia 01/31/2007  . ANXIETY 01/31/2007  . Essential hypertension 01/31/2007  . GERD 01/31/2007   Past Medical History:  Diagnosis Date  . Anxiety   . GERD (gastroesophageal reflux disease)   . Hyperlipidemia   . Hypertension   . Hypothyroidism   . Vitamin D deficiency    Past Surgical History:  Procedure Laterality Date  . COLONOSCOPY    . LEEP    . TUBAL LIGATION    . VEIN LIGATION AND STRIPPING     x2   Social History  Substance Use Topics  . Smoking status: Former Smoker    Quit date: 04/24/1996  . Smokeless tobacco: Never Used  . Alcohol use 0.0 oz/week     Comment: occ. (maybe every 3 mos.)   Family History  Problem  Relation Age of Onset  . Transient ischemic attack Mother   . Hypertension Mother   . Hypertension Father   . Colon cancer Neg Hx   . Esophageal cancer Neg Hx   . Rectal cancer Neg Hx   . Stomach cancer Neg Hx    Allergies  Allergen Reactions  . Amlodipine Besylate     REACTION: edema  . Atorvastatin     REACTION: muscle pain  . Neomycin-Bacitracin Zn-Polymyx     REACTION: rash Per pt not sure if from meds or not   Current Outpatient Prescriptions on File Prior to Visit  Medication Sig Dispense Refill  . aspirin 81 MG tablet Take 81 mg by mouth daily.    . calcium carbonate (TUMS EX) 750 MG chewable tablet Chew 2 tablets by mouth at bedtime.    . Calcium Citrate-Vitamin D 1000-400 LIQD Take 2 tablets by mouth daily.     . Cholecalciferol (VITAMIN D) 1000 UNITS capsule Take 2,000 Units by mouth daily.     Marland Kitchen estradiol (ESTRACE) 0.1 MG/GM vaginal cream Use 2 times a week as directed (about 2 cm in applicator).     . RESTASIS 0.05 % ophthalmic emulsion Place 1 drop into both eyes Twice daily.     No current facility-administered medications on file prior to visit.       Review of Systems Review of Systems  Constitutional: Negative for fever, appetite change,  and unexpected weight change.  Eyes: Negative for pain and visual disturbance.  Respiratory: Negative for cough and shortness of breath.   Cardiovascular: Negative for cp and pos for palpitations, neg for PND or orthopnea  Gastrointestinal: Negative for nausea, diarrhea and constipation.  Genitourinary: Negative for urgency and frequency.  Skin: Negative for pallor or rash   MSK pos for shoulder pain and hip pain , neg for swollen joints Neurological: Negative for weakness, light-headedness, numbness and headaches.  Hematological: Negative for adenopathy. Does not bruise/bleed easily.  Psychiatric/Behavioral: Negative for dysphoric mood. The patient is not nervous/anxious.      Objective:   Physical Exam    Constitutional: She appears well-developed and well-nourished. No distress.  obese and well appearing   HENT:  Head: Normocephalic and atraumatic.  Mouth/Throat: Oropharynx is clear and  moist.  Eyes: Conjunctivae and EOM are normal. Pupils are equal, round, and reactive to light.  Neck: Normal range of motion. Neck supple. No JVD present. Carotid bruit is not present. No thyromegaly present.  Cardiovascular: Normal rate, regular rhythm, normal heart sounds and intact distal pulses.  Exam reveals no gallop.   Pulmonary/Chest: Effort normal and breath sounds normal. No respiratory distress. She has no wheezes. She has no rales.  No crackles  Abdominal: Soft. Bowel sounds are normal. She exhibits no distension, no abdominal bruit and no mass. There is no tenderness.  Musculoskeletal: She exhibits tenderness. She exhibits no edema.       Right shoulder: She exhibits decreased range of motion, tenderness and bony tenderness. She exhibits no swelling, no effusion, no crepitus, no deformity, no spasm and normal pulse.       Right hip: She exhibits normal range of motion, normal strength, no bony tenderness, no swelling, no crepitus and no deformity.  R hip -mild trochanteric tenderness Nl rom  Nl SLR No spinal tenderness  R shoulder-tender acromion  Apprehensive to extend  Pos Hawking /Neer tests Limited int/ext rotation due to pain     Lymphadenopathy:    She has no cervical adenopathy.  Neurological: She is alert. She has normal reflexes. She displays no atrophy and no tremor. No cranial nerve deficit or sensory deficit. She exhibits normal muscle tone. Coordination and gait normal.  Skin: Skin is warm and dry. No rash noted. No pallor.  Psychiatric: She has a normal mood and affect.          Assessment & Plan:   Problem List Items Addressed This Visit      Cardiovascular and Mediastinum   Essential hypertension - Primary    bp in fair control at this time  BP Readings from  Last 1 Encounters:  01/24/16 122/78   No changes needed Disc lifstyle change with low sodium diet and exercise   Labs reviewed       Relevant Medications   losartan (COZAAR) 100 MG tablet   hydrochlorothiazide (HYDRODIURIL) 50 MG tablet   simvastatin (ZOCOR) 80 MG tablet     Digestive   GERD    Refilled protonix generic Pt has rebound symptoms if she misses one day       Relevant Medications   pantoprazole (PROTONIX) 40 MG tablet     Endocrine   Hypothyroidism    Hypothyroidism  Pt has no clinical changes No change in energy level/ hair or skin/ edema and no tremor Lab Results  Component Value Date   TSH 2.92 01/20/2016           Relevant Medications   levothyroxine (SYNTHROID, LEVOTHROID) 50 MCG tablet     Other   Hyperlipidemia    Disc goals for lipids and reasons to control them Rev labs with pt Rev low sat fat diet in detail Stable with simvastatin and diet       Relevant Medications   losartan (COZAAR) 100 MG tablet   hydrochlorothiazide (HYDRODIURIL) 50 MG tablet   simvastatin (ZOCOR) 80 MG tablet   Hypokalemia    Will replace this wit 10 meq daily  Re check level in 2 wk      Obesity    Discussed how this problem influences overall health and the risks it imposes  Reviewed plan for weight loss with lower calorie diet (via better food choices and also portion control or program like weight watchers) and exercise building up to or  more than 30 minutes 5 days per week including some aerobic activity         Palpitations    Acute on chronic  Enc strongly to wean off caffeine (as recommended last time)-pt is resistant to this  EKG nsr with rate in 70s Disc replacing potassium  If no imp consider ref to cardiology      Relevant Orders   EKG 12-Lead (Completed)   Right hip pain    In groin traveling down leg Intermittent  xr today -plan to follow / rule out OA of hip Recommend stretching and physical activity as tolerated        Relevant  Orders   DG HIP UNILAT WITH PELVIS 2-3 VIEWS RIGHT (Completed)   Right shoulder pain    With limited rom /abduction  Tender over acromion  Suspect rotator cuff involvement  Ref to sport med for further eval Recommend use of ice       Vitamin D deficiency disease    D level is still in the 20s  She has been non compliant with vit D Recommend continued ca plus D once daily  Add 2000 iu extra D3 otc daily  Disc imp to bone and overall health       Other Visit Diagnoses    Need for influenza vaccination       Relevant Orders   Flu Vaccine QUAD 36+ mos IM (Completed)

## 2016-01-24 NOTE — Assessment & Plan Note (Signed)
bp in fair control at this time  BP Readings from Last 1 Encounters:  01/24/16 122/78   No changes needed Disc lifstyle change with low sodium diet and exercise   Labs reviewed

## 2016-01-24 NOTE — Assessment & Plan Note (Signed)
Will replace this wit 10 meq daily  Re check level in 2 wk

## 2016-01-24 NOTE — Assessment & Plan Note (Signed)
Discussed how this problem influences overall health and the risks it imposes  Reviewed plan for weight loss with lower calorie diet (via better food choices and also portion control or program like weight watchers) and exercise building up to or more than 30 minutes 5 days per week including some aerobic activity    

## 2016-01-24 NOTE — Patient Instructions (Addendum)
Your potassium is a bit low Start taking the potassium supplement once daily - you can cut it in 1/2 if it is too big and divide up or take at the same time  Continue the calcium and D combo once daily  Add 2000 iu of vitamin D3 over the counter once daily   Work on watching carbohydrates/simple sugars as well as the trans/sat fats   For palpitations - start cutting caffeine by 1 serving per week  Also make sure to drink enough water and get on the potassium   Make lab appt for 2 weeks for potassium level   I wonder if some of the dizziness / funny feeling may improve with potassium also  Vertigo is also a possibility  flonase helps - it keeps ear tubes open - so use it every day through the allergy season  If symptoms do not improve let me know   Xray of right hip today   For shoulder - use ice whenever you can for 10 minutes at a time  Make an appointment with Dr Patsy Lageropland at check out (sport med)

## 2016-01-24 NOTE — Assessment & Plan Note (Signed)
D level is still in the 20s  She has been non compliant with vit D Recommend continued ca plus D once daily  Add 2000 iu extra D3 otc daily  Disc imp to bone and overall health

## 2016-01-24 NOTE — Assessment & Plan Note (Signed)
With limited rom /abduction  Tender over acromion  Suspect rotator cuff involvement  Ref to sport med for further eval Recommend use of ice

## 2016-01-24 NOTE — Assessment & Plan Note (Signed)
In groin traveling down leg Intermittent  xr today -plan to follow / rule out OA of hip Recommend stretching and physical activity as tolerated

## 2016-01-26 NOTE — Telephone Encounter (Signed)
Left v/m requesting cb. 

## 2016-01-28 ENCOUNTER — Other Ambulatory Visit: Payer: Self-pay | Admitting: Family Medicine

## 2016-01-28 DIAGNOSIS — E876 Hypokalemia: Secondary | ICD-10-CM

## 2016-01-31 ENCOUNTER — Encounter: Payer: Self-pay | Admitting: Family Medicine

## 2016-01-31 ENCOUNTER — Ambulatory Visit (INDEPENDENT_AMBULATORY_CARE_PROVIDER_SITE_OTHER): Payer: Medicare Other | Admitting: Family Medicine

## 2016-01-31 VITALS — BP 130/88 | HR 82 | Temp 98.5°F | Ht 66.5 in | Wt 205.2 lb

## 2016-01-31 DIAGNOSIS — M25551 Pain in right hip: Secondary | ICD-10-CM

## 2016-01-31 DIAGNOSIS — M19011 Primary osteoarthritis, right shoulder: Secondary | ICD-10-CM

## 2016-01-31 DIAGNOSIS — M7581 Other shoulder lesions, right shoulder: Secondary | ICD-10-CM

## 2016-01-31 DIAGNOSIS — M7551 Bursitis of right shoulder: Secondary | ICD-10-CM | POA: Diagnosis not present

## 2016-01-31 MED ORDER — METHYLPREDNISOLONE ACETATE 40 MG/ML IJ SUSP
80.0000 mg | Freq: Once | INTRAMUSCULAR | Status: AC
Start: 2016-01-31 — End: 2016-01-31
  Administered 2016-01-31: 80 mg via INTRA_ARTICULAR

## 2016-01-31 NOTE — Progress Notes (Signed)
Pre visit review using our clinic review tool, if applicable. No additional management support is needed unless otherwise documented below in the visit note. 

## 2016-01-31 NOTE — Patient Instructions (Signed)

## 2016-01-31 NOTE — Progress Notes (Signed)
Dr. Karleen Hampshire T. Savi Lastinger, MD, CAQ Sports Medicine Primary Care and Sports Medicine [address] [place], [address] Phone: [phone] Fax: [phone]  01/31/2016  Patient: Sharon [name], MRN: [id number], DOB: 1950-09-05, 65 y.o.  Primary Physician:  Roxy Manns, MD   Chief Complaint  Patient presents with  . Shoulder Pain    Right per Dr. Milinda Antis  . Hip Pain    Right   Subjective:   Sharon [name] is a 65 y.o. very pleasant female patient who presents with the following:  R shoulder pain since 07/2015 Writes L hand - everything else with R In the 1990's, had something similar  No fracture or dislocation. Abd and IROM are important.  Hard to pull on bra and in th passenger seat.  Has used some ice, some exercise.  Not done too much.  Crepitus on the R  Hip pain - multiple areas. Feels like an electric shock.  At least a couple of years.  She has pain in the groin as well as pain laterally and posteriorly. Films were directly reviewed face-to-face with the patient. The patient effectively has no intra-articular osteoarthritis of her right hip.  Past Medical History, Surgical History, Social History, Family History, Problem List, Medications, and Allergies have been reviewed and updated if relevant.  Patient Active Problem List   Diagnosis Date Noted  . Right shoulder pain 01/24/2016  . Right hip pain 01/24/2016  . Obesity 10/05/2014  . Anemia 09/24/2013  . Palpitations 03/03/2013  . Colon cancer screening 03/03/2013  . Hypokalemia 10/09/2011  . Routine general medical examination at a health care facility 10/02/2011  . Nasal congestion 04/10/2011  . Vitamin D deficiency disease 12/28/2010  . ARTHRALGIA 08/19/2007  . Hypothyroidism 01/31/2007  . Hyperlipidemia 01/31/2007  . ANXIETY 01/31/2007  . Essential hypertension 01/31/2007  . GERD 01/31/2007    Past Medical History:  Diagnosis Date  . Anxiety   . GERD (gastroesophageal reflux disease)   .  Hyperlipidemia   . Hypertension   . Hypothyroidism   . Vitamin D deficiency     Past Surgical History:  Procedure Laterality Date  . COLONOSCOPY    . LEEP    . TUBAL LIGATION    . VEIN LIGATION AND STRIPPING     x2    Social History   Social History  . Marital status: Married    Spouse name: N/A  . Number of children: N/A  . Years of education: N/A   Occupational History  . Not on file.   Social History Main Topics  . Smoking status: Former Smoker    Quit date: 04/24/1996  . Smokeless tobacco: Never Used  . Alcohol use 0.0 oz/week     Comment: occ. (maybe every 3 mos.)  . Drug use: No  . Sexual activity: Not on file   Other Topics Concern  . Not on file   Social History Narrative  . No narrative on file    Family History  Problem Relation Age of Onset  . Transient ischemic attack Mother   . Hypertension Mother   . Hypertension Father   . Colon cancer Neg Hx   . Esophageal cancer Neg Hx   . Rectal cancer Neg Hx   . Stomach cancer Neg Hx     Allergies  Allergen Reactions  . Amlodipine Besylate     REACTION: edema  . Atorvastatin     REACTION: muscle pain  . Neomycin-Bacitracin Zn-Polymyx     REACTION:  rash Per pt not sure if from meds or not    Medication list reviewed and updated in full in [facility].  GEN: No fevers, chills. Nontoxic. Primarily MSK c/o today. MSK: Detailed in the HPI GI: tolerating PO intake without difficulty Neuro: No numbness, parasthesias, or tingling associated. Otherwise the pertinent positives of the ROS are noted above.   Objective:   BP 130/88   Pulse 82   Temp 98.5 F (36.9 C) (Oral)   Ht 5' 6.5" (1.689 m)   Wt 205 lb 4 oz (93.1 kg)   BMI 32.63 kg/m    GEN: Well-developed,well-nourished,in no acute distress; alert,appropriate and cooperative throughout examination HEENT: Normocephalic and atraumatic without obvious abnormalities. Ears, externally no deformities PULM: Breathing comfortably in no  respiratory distress EXT: No clubbing, cyanosis, or edema PSYCH: Normally interactive. Cooperative during the interview. Pleasant. Friendly and conversant. Not anxious or depressed appearing. Normal, full affect.  Shoulder: R Inspection: No muscle wasting or winging Crepitus with motion Ecchymosis/edema: neg  AC joint, scapula, clavicle: NT Cervical spine: NT, full ROM Spurling's: neg Abduction: full, 5/5 Flexion: full, 5/5 IR, full, lift-off: 5/5 ER at neutral: full, 5/5 AC crossover: pos Neer: pos Hawkins: pos Drop Test: neg Empty Can: pos Supraspinatus insertion: mild-mod T Bicipital groove: NT Speed's: neg Yergason's: neg Sulcus sign: neg Scapular dyskinesis: none C5-T1 intact  Neuro: Sensation intact Grip 5/5   HIP EXAM: SIDE: R ROM: Abduction, Flexion, Internal and External range of motion: full Pain with terminal IROM and EROM: no GTB: NT SLR: NEG Knees: No effusion FABER: NT REVERSE FABER: NT, neg Piriformis: NT at direct palpation Str: flexion: 3+/5, pain abduction: 3+/5 adduction: 4/5   Radiology: Dg Hip Unilat With Pelvis 2-3 Views Right  Result Date: 01/24/2016 CLINICAL DATA:  Hip pain. EXAM: DG HIP (WITH OR WITHOUT PELVIS) 2-3V RIGHT COMPARISON:  No recent prior. FINDINGS: No acute bony or joint abnormalities identified. No evidence of fracture or dislocation. IMPRESSION: No acute or focal abnormality. Electronically Signed   By: Maisie Fushomas  Register   On: 01/24/2016 14:19    Assessment and Plan:   Rotator cuff tendonitis, right - Plan: Ambulatory referral to Physical Therapy, methylPREDNISolone acetate (DEPO-MEDROL) injection 80 mg  Subacromial bursitis of right shoulder joint - Plan: Ambulatory referral to Physical Therapy  Glenohumeral arthritis, right - Plan: Ambulatory referral to Physical Therapy  Right hip pain  Combined impingement on the right side with subacromial bursitis and tendinopathy of the rotator cuff. Patient also has some  almost certain glenohumeral arthritis as well. Formal PT.  Right hip is more very poor core strength and some overuse tendinopathy as well. A rehabilitation program from the [employer] was reviewed with the patient face to face for their condition.   Intrarticular Shoulder Injection, R Verbal consent was obtained from the patient. Risks including infection explained and contrasted with benefits and alternatives. Patient prepped with Chloraprep and Ethyl Chloride used for anesthesia. An intraarticular shoulder injection was performed using the posterior approach. The patient tolerated the procedure well and had decreased pain post injection. No complications. Injection: 4 cc of Lidocaine 1% and 1 mL Depo-Medrol 40 mg. Needle: 22 gauge   SubAC Injection, R Verbal consent was obtained from the patient. Risks (including rare infection), benefits, and alternatives were explained. Patient prepped with Chloraprep and Ethyl Chloride used for anesthesia. The subacromial space was injected using the posterior approach. The patient tolerated the procedure well and had decreased pain post injection. No complications.  Injection: 4 cc of Lidocaine 1% and 1 mL of Depo-Medrol 40 mg. Needle: 22 gauge    Follow-up: Return in about 6 weeks (around 03/13/2016).  Orders Placed This Encounter  Procedures  . Ambulatory referral to Physical Therapy    Signed,  Karleen Hampshire T. Rhyse Loux, MD   Patient's Medications  New Prescriptions   No medications on file  Previous Medications   ASPIRIN 81 MG TABLET    Take 81 mg by mouth daily.   BUPROPION (WELLBUTRIN XL) 150 MG 24 HR TABLET    Take 1 tablet (150 mg total) by mouth daily.   CALCIUM CARBONATE (TUMS EX) 750 MG CHEWABLE TABLET    Chew 2 tablets by mouth at bedtime.   CALCIUM CITRATE-VITAMIN D 1000-400 LIQD    Take 2 tablets by mouth daily.    CHOLECALCIFEROL (VITAMIN D) 1000 UNITS CAPSULE    Take 2,000 Units by mouth daily.     ESTRADIOL (ESTRACE) 0.1 MG/GM VAGINAL CREAM    Use 2 times a week as directed (about 2 cm in applicator).    FLUTICASONE (FLONASE) 50 MCG/ACT NASAL SPRAY    USE 2 SPRAYS INTO EACH NOSTRIL DAILY.   HYDROCHLOROTHIAZIDE (HYDRODIURIL) 50 MG TABLET    Take 1 tablet (50 mg total) by mouth daily.   LEVOTHYROXINE (SYNTHROID, LEVOTHROID) 50 MCG TABLET    Take 1 tablet (50 mcg total) by mouth daily before breakfast.   LOSARTAN (COZAAR) 100 MG TABLET    Take 1 tablet (100 mg total) by mouth daily.   PANTOPRAZOLE (PROTONIX) 40 MG TABLET    Take 1 tablet (40 mg total) by mouth daily.   POTASSIUM CHLORIDE (K-DUR) 10 MEQ TABLET    Take 1 tablet (10 mEq total) by mouth daily.   RESTASIS 0.05 % OPHTHALMIC EMULSION    Place 1 drop into both eyes Twice daily.   SERTRALINE (ZOLOFT) 100 MG TABLET    Take 1 tablet (100 mg total) by mouth daily.   SIMVASTATIN (ZOCOR) 80 MG TABLET    Take 1 tablet (80 mg total) by mouth daily.  Modified Medications   No medications on file  Discontinued Medications   No medications on file

## 2016-02-07 ENCOUNTER — Other Ambulatory Visit: Payer: Medicare Other

## 2016-02-07 ENCOUNTER — Other Ambulatory Visit (INDEPENDENT_AMBULATORY_CARE_PROVIDER_SITE_OTHER): Payer: Medicare Other

## 2016-02-07 DIAGNOSIS — E876 Hypokalemia: Secondary | ICD-10-CM

## 2016-02-07 DIAGNOSIS — S46011D Strain of muscle(s) and tendon(s) of the rotator cuff of right shoulder, subsequent encounter: Secondary | ICD-10-CM | POA: Diagnosis not present

## 2016-02-07 DIAGNOSIS — M25511 Pain in right shoulder: Secondary | ICD-10-CM | POA: Diagnosis not present

## 2016-02-07 DIAGNOSIS — M7541 Impingement syndrome of right shoulder: Secondary | ICD-10-CM | POA: Diagnosis not present

## 2016-02-07 LAB — POTASSIUM: POTASSIUM: 4 meq/L (ref 3.5–5.1)

## 2016-02-08 NOTE — Telephone Encounter (Signed)
Spoke with [facility] and pt has already picked up refills. Unable to reach pt by phone to let her know rx were sent to pharmacy as requested.

## 2016-02-09 ENCOUNTER — Other Ambulatory Visit: Payer: Self-pay

## 2016-02-09 MED ORDER — POTASSIUM CHLORIDE ER 10 MEQ PO TBCR: 10.0000 meq | EXTENDED_RELEASE_TABLET | Freq: Every day | ORAL | 3 refills | Status: DC

## 2016-02-09 MED ORDER — LOSARTAN POTASSIUM 100 MG PO TABS: 100.0000 mg | ORAL_TABLET | Freq: Every day | ORAL | 3 refills | Status: DC

## 2016-02-09 MED ORDER — LEVOTHYROXINE SODIUM 50 MCG PO TABS: 50.0000 ug | ORAL_TABLET | Freq: Every day | ORAL | 3 refills | Status: DC

## 2016-02-09 MED ORDER — SIMVASTATIN 80 MG PO TABS: 80.0000 mg | ORAL_TABLET | Freq: Every day | ORAL | 3 refills | Status: DC

## 2016-02-09 MED ORDER — SERTRALINE HCL 100 MG PO TABS: 100.0000 mg | ORAL_TABLET | Freq: Every day | ORAL | 3 refills | Status: DC

## 2016-02-09 MED ORDER — PANTOPRAZOLE SODIUM 40 MG PO TBEC: 40.0000 mg | DELAYED_RELEASE_TABLET | Freq: Every day | ORAL | 3 refills | Status: DC

## 2016-02-09 MED ORDER — HYDROCHLOROTHIAZIDE 50 MG PO TABS: 50.0000 mg | ORAL_TABLET | Freq: Every day | ORAL | 3 refills | Status: DC

## 2016-02-09 MED ORDER — BUPROPION HCL ER (XL) 150 MG PO TB24: 150.0000 mg | ORAL_TABLET | Freq: Every day | ORAL | 3 refills | Status: DC

## 2016-02-09 NOTE — Telephone Encounter (Signed)
Pt request 90 day refills for bupropion,HCTZ,levothyroxine,losartan,pantoprazole,sertraline, simvastatin and K to [facility]; pt has set up acct. Pt voiced understanding refills sent electronically. And refills at [facility] done on 01/24/16 cancelled except left flonase and gave # 30 for bupropion.

## 2016-02-17 MED ORDER — SIMVASTATIN 80 MG PO TABS: 80.0000 mg | ORAL_TABLET | Freq: Every day | ORAL | 3 refills | Status: DC

## 2016-02-17 MED ORDER — POTASSIUM CHLORIDE ER 10 MEQ PO TBCR: 10.0000 meq | EXTENDED_RELEASE_TABLET | Freq: Every day | ORAL | 3 refills | Status: DC

## 2016-02-17 MED ORDER — LEVOTHYROXINE SODIUM 50 MCG PO TABS: 50.0000 ug | ORAL_TABLET | Freq: Every day | ORAL | 3 refills | Status: DC

## 2016-02-17 MED ORDER — LOSARTAN POTASSIUM 100 MG PO TABS: 100.0000 mg | ORAL_TABLET | Freq: Every day | ORAL | 3 refills | Status: DC

## 2016-02-17 MED ORDER — HYDROCHLOROTHIAZIDE 50 MG PO TABS: 50.0000 mg | ORAL_TABLET | Freq: Every day | ORAL | 3 refills | Status: DC

## 2016-02-17 MED ORDER — PANTOPRAZOLE SODIUM 40 MG PO TBEC: 40.0000 mg | DELAYED_RELEASE_TABLET | Freq: Every day | ORAL | 3 refills | Status: DC

## 2016-02-17 MED ORDER — BUPROPION HCL ER (XL) 150 MG PO TB24: 150.0000 mg | ORAL_TABLET | Freq: Every day | ORAL | 3 refills | Status: DC

## 2016-02-17 MED ORDER — SERTRALINE HCL 100 MG PO TABS: 100.0000 mg | ORAL_TABLET | Freq: Every day | ORAL | 3 refills | Status: DC

## 2016-02-17 NOTE — Telephone Encounter (Signed)
Sharon CitizenPauline with [facility] said pt needs refills on bupropion,HCTZ,levothyroxine,losartan,pantoprazole,sertraline,simvastatin and K.Sharon Citizenauline could not find refills done on 02/09/16. Resend electronically now.

## 2016-02-17 NOTE — Addendum Note (Signed)
Addended by: Patience MuscaISLEY, RENA M on: 02/17/2016 02:19 PM   Modules accepted: Orders

## 2016-03-06 ENCOUNTER — Encounter: Payer: Self-pay | Admitting: Family Medicine

## 2016-03-13 ENCOUNTER — Encounter: Payer: Self-pay | Admitting: Family Medicine

## 2016-03-13 ENCOUNTER — Ambulatory Visit (INDEPENDENT_AMBULATORY_CARE_PROVIDER_SITE_OTHER): Payer: Medicare Other | Admitting: Family Medicine

## 2016-03-13 VITALS — BP 130/90 | HR 86 | Temp 99.0°F | Ht 66.5 in | Wt 205.2 lb

## 2016-03-13 DIAGNOSIS — M7581 Other shoulder lesions, right shoulder: Secondary | ICD-10-CM

## 2016-03-13 DIAGNOSIS — M7551 Bursitis of right shoulder: Secondary | ICD-10-CM

## 2016-03-13 DIAGNOSIS — M25551 Pain in right hip: Secondary | ICD-10-CM

## 2016-03-13 DIAGNOSIS — M546 Pain in thoracic spine: Secondary | ICD-10-CM | POA: Diagnosis not present

## 2016-03-13 MED ORDER — CYCLOBENZAPRINE HCL 10 MG PO TABS: 5.0000 mg | ORAL_TABLET | Freq: Three times a day (TID) | ORAL | 0 refills | Status: DC | PRN

## 2016-03-13 NOTE — Progress Notes (Signed)
Pre visit review using our clinic review tool, if applicable. No additional management support is needed unless otherwise documented below in the visit note. 

## 2016-03-13 NOTE — Progress Notes (Signed)
Dr. Karleen Hampshire T. Jaydyn Menon, MD, CAQ Sports Medicine Primary Care and Sports Medicine [address] [place], [address] Phone: [phone] Fax: [phone]  03/13/2016  Patient: Sharon [name], MRN: [id number], DOB: 1951/02/11, 65 y.o.  Primary Physician:  Roxy Manns, MD   No chief complaint on file.  Subjective:   Sharon [name] is a 66 y.o. very pleasant female patient who presents with the following:  F/u R shoulder pain and hip pain - multiple years, She has felt much better and she has a long time. We did a subacromial injection and a combined intra-articular injection at the last office visit.  R shoulder is much better.  Went to PT.   Now her back is bothering her - now having some back spasms.  Right in the center - to the left of the spine.  Ice helped a little.  She has continued to do her hip and shoulder rehabilitation, and thinks it is helping her.  01/31/2016 Last OV with Hannah Beat, MD  R shoulder pain since 07/2015 Writes L hand - everything else with R In the 1990's, had something similar  No fracture or dislocation. Abd and IROM are important.  Hard to pull on bra and in th passenger seat.  Has used some ice, some exercise.  Not done too much.  Crepitus on the R  Hip pain - multiple areas. Feels like an electric shock.  At least a couple of years.  She has pain in the groin as well as pain laterally and posteriorly. Films were directly reviewed face-to-face with the patient. The patient effectively has no intra-articular osteoarthritis of her right hip.  Past Medical History, Surgical History, Social History, Family History, Problem List, Medications, and Allergies have been reviewed and updated if relevant.  Patient Active Problem List   Diagnosis Date Noted  . Right shoulder pain 01/24/2016  . Right hip pain 01/24/2016  . Obesity 10/05/2014  . Anemia 09/24/2013  . Palpitations 03/03/2013  . Colon cancer screening 03/03/2013  .  Hypokalemia 10/09/2011  . Routine general medical examination at a health care facility 10/02/2011  . Nasal congestion 04/10/2011  . Vitamin D deficiency disease 12/28/2010  . ARTHRALGIA 08/19/2007  . Hypothyroidism 01/31/2007  . Hyperlipidemia 01/31/2007  . ANXIETY 01/31/2007  . Essential hypertension 01/31/2007  . GERD 01/31/2007    Past Medical History:  Diagnosis Date  . Anxiety   . GERD (gastroesophageal reflux disease)   . Hyperlipidemia   . Hypertension   . Hypothyroidism   . Vitamin D deficiency     Past Surgical History:  Procedure Laterality Date  . COLONOSCOPY    . LEEP    . TUBAL LIGATION    . VEIN LIGATION AND STRIPPING     x2    Social History   Social History  . Marital status: Married    Spouse name: N/A  . Number of children: N/A  . Years of education: N/A   Occupational History  . Not on file.   Social History Main Topics  . Smoking status: Former Smoker    Quit date: 04/24/1996  . Smokeless tobacco: Never Used  . Alcohol use 0.0 oz/week     Comment: occ. (maybe every 3 mos.)  . Drug use: No  . Sexual activity: Not on file   Other Topics Concern  . Not on file   Social History Narrative  . No narrative on file    Family History  Problem Relation Age of  Onset  . Transient ischemic attack Mother   . Hypertension Mother   . Hypertension Father   . Colon cancer Neg Hx   . Esophageal cancer Neg Hx   . Rectal cancer Neg Hx   . Stomach cancer Neg Hx     Allergies  Allergen Reactions  . Amlodipine Besylate     REACTION: edema  . Atorvastatin     REACTION: muscle pain  . Neomycin-Bacitracin Zn-Polymyx     REACTION: rash Per pt not sure if from meds or not    Medication list reviewed and updated in full in [facility] Link.  GEN: No fevers, chills. Nontoxic. Primarily MSK c/o today. MSK: Detailed in the HPI GI: tolerating PO intake without difficulty Neuro: No numbness, parasthesias, or tingling associated. Otherwise the  pertinent positives of the ROS are noted above.   Objective:   There were no vitals taken for this visit.   GEN: Well-developed,well-nourished,in no acute distress; alert,appropriate and cooperative throughout examination HEENT: Normocephalic and atraumatic without obvious abnormalities. Ears, externally no deformities PULM: Breathing comfortably in no respiratory distress EXT: No clubbing, cyanosis, or edema PSYCH: Normally interactive. Cooperative during the interview. Pleasant. Friendly and conversant. Not anxious or depressed appearing. Normal, full affect.  Shoulder: R Inspection: No muscle wasting or winging Crepitus with motion Ecchymosis/edema: neg  AC joint, scapula, clavicle: NT Cervical spine: NT, full ROM Spurling's: neg Abduction: full, 5/5 Flexion: full, 5/5 IR, full, lift-off: 5/5 ER at neutral: full, 5/5 AC crossover: neg Neer: neg Hawkins: neg Drop Test: neg Empty Can: neg Supraspinatus insertion: mild-mod T Bicipital groove: NT Speed's: neg Yergason's: neg Sulcus sign: neg Scapular dyskinesis: none C5-T1 intact  Neuro: Sensation intact Grip 5/5   HIP EXAM: SIDE: R ROM: Abduction, Flexion, Internal and External range of motion: full Pain with terminal IROM and EROM: no GTB: NT SLR: NEG Knees: No effusion FABER: NT REVERSE FABER: NT, neg Piriformis: NT at direct palpation Str: flexion: 4/5, pain abduction: 4/5 adduction: 4+/5   Radiology: No results found.  Assessment and Plan:   Acute bilateral thoracic back pain  Subacromial bursitis of right shoulder joint  Rotator cuff tendonitis, right  Right hip pain  Shoulder and hip are much better.  Thoracic back pain, probable secondary to prior right-sided hip pain, but now with muscle spasms that are keeping the patient awake at nighttime. I'm going to give her some Flexeril as well as a home rehabilitation program. A rehabilitation program from the [employer]  was reviewed with the patient face to face for their condition.   Follow-up: prn  Meds ordered this encounter  Medications  . cyclobenzaprine (FLEXERIL) 10 MG tablet    Sig: Take 0.5-1 tablets (5-10 mg total) by mouth 3 (three) times daily as needed for muscle spasms.    Dispense:  30 tablet    Refill:  0   Signed,  Adil Tugwell T. Troye Hiemstra, MD     Medication List       Accurate as of 03/13/16  1:47 PM. Always use your most recent med list.          aspirin 81 MG tablet Take 81 mg by mouth daily.   buPROPion 150 MG 24 hr tablet Commonly known as:  WELLBUTRIN XL Take 1 tablet (150 mg total) by mouth daily.   calcium carbonate 750 MG chewable tablet Commonly known as:  TUMS EX Chew 2 tablets by mouth at bedtime.   Calcium Citrate-Vitamin D 1000-400 Liqd Take 2  tablets by mouth daily.   cyclobenzaprine 10 MG tablet Commonly known as:  FLEXERIL Take 0.5-1 tablets (5-10 mg total) by mouth 3 (three) times daily as needed for muscle spasms.   estradiol 0.1 MG/GM vaginal cream Commonly known as:  ESTRACE Use 2 times a week as directed (about 2 cm in applicator).   fluticasone 50 MCG/ACT nasal spray Commonly known as:  FLONASE USE 2 SPRAYS INTO EACH NOSTRIL DAILY.   hydrochlorothiazide 50 MG tablet Commonly known as:  HYDRODIURIL Take 1 tablet (50 mg total) by mouth daily.   levothyroxine 50 MCG tablet Commonly known as:  SYNTHROID, LEVOTHROID Take 1 tablet (50 mcg total) by mouth daily before breakfast.   losartan 100 MG tablet Commonly known as:  COZAAR Take 1 tablet (100 mg total) by mouth daily.   pantoprazole 40 MG tablet Commonly known as:  PROTONIX Take 1 tablet (40 mg total) by mouth daily.   potassium chloride 10 MEQ tablet Commonly known as:  K-DUR Take 1 tablet (10 mEq total) by mouth daily.   RESTASIS 0.05 % ophthalmic emulsion Generic drug:  cycloSPORINE Place 1 drop into both eyes Twice daily.   sertraline 100 MG tablet Commonly known as:   ZOLOFT Take 1 tablet (100 mg total) by mouth daily.   simvastatin 80 MG tablet Commonly known as:  ZOCOR Take 1 tablet (80 mg total) by mouth daily.   Vitamin D 1000 units capsule Take 2,000 Units by mouth daily.

## 2016-03-22 DIAGNOSIS — H04123 Dry eye syndrome of bilateral lacrimal glands: Secondary | ICD-10-CM | POA: Diagnosis not present

## 2016-03-22 DIAGNOSIS — H16223 Keratoconjunctivitis sicca, not specified as Sjogren's, bilateral: Secondary | ICD-10-CM | POA: Diagnosis not present

## 2016-03-22 DIAGNOSIS — H25813 Combined forms of age-related cataract, bilateral: Secondary | ICD-10-CM | POA: Diagnosis not present

## 2016-03-22 DIAGNOSIS — H524 Presbyopia: Secondary | ICD-10-CM | POA: Diagnosis not present

## 2016-06-29 ENCOUNTER — Telehealth: Payer: Self-pay | Admitting: Family Medicine

## 2016-06-29 NOTE — Telephone Encounter (Signed)
[facility]  lvm for pt to call Revonda Standardllison back at [phone]

## 2016-08-15 NOTE — Telephone Encounter (Signed)
Scheduled 08/22/16

## 2016-08-22 ENCOUNTER — Ambulatory Visit (INDEPENDENT_AMBULATORY_CARE_PROVIDER_SITE_OTHER): Payer: Medicare Other | Admitting: Family Medicine

## 2016-08-22 ENCOUNTER — Encounter: Payer: Self-pay | Admitting: Family Medicine

## 2016-08-22 VITALS — BP 118/78 | HR 76 | Temp 98.3°F | Ht 66.75 in | Wt 203.0 lb

## 2016-08-22 DIAGNOSIS — E6609 Other obesity due to excess calories: Secondary | ICD-10-CM

## 2016-08-22 DIAGNOSIS — E038 Other specified hypothyroidism: Secondary | ICD-10-CM | POA: Diagnosis not present

## 2016-08-22 DIAGNOSIS — E782 Mixed hyperlipidemia: Secondary | ICD-10-CM

## 2016-08-22 DIAGNOSIS — Z23 Encounter for immunization: Secondary | ICD-10-CM

## 2016-08-22 DIAGNOSIS — M25511 Pain in right shoulder: Secondary | ICD-10-CM

## 2016-08-22 DIAGNOSIS — E559 Vitamin D deficiency, unspecified: Secondary | ICD-10-CM | POA: Diagnosis not present

## 2016-08-22 DIAGNOSIS — G8929 Other chronic pain: Secondary | ICD-10-CM

## 2016-08-22 DIAGNOSIS — I10 Essential (primary) hypertension: Secondary | ICD-10-CM

## 2016-08-22 DIAGNOSIS — Z0000 Encounter for general adult medical examination without abnormal findings: Secondary | ICD-10-CM | POA: Diagnosis not present

## 2016-08-22 DIAGNOSIS — Z6832 Body mass index (BMI) 32.0-32.9, adult: Secondary | ICD-10-CM

## 2016-08-22 MED ORDER — FLUTICASONE PROPIONATE 50 MCG/ACT NA SUSP: NASAL | 11 refills | Status: DC

## 2016-08-22 MED ORDER — PNEUMOCOCCAL 13-VAL CONJ VACC IM SUSP
0.5000 mL | Freq: Once | INTRAMUSCULAR | Status: AC
  Administered 2016-08-22: 0.5 mL via INTRAMUSCULAR

## 2016-08-22 NOTE — Progress Notes (Signed)
Pre visit review using our clinic review tool, if applicable. No additional management support is needed unless otherwise documented below in the visit note. 

## 2016-08-22 NOTE — Assessment & Plan Note (Signed)
Discussed how this problem influences overall health and the risks it imposes  Reviewed plan for weight loss with lower calorie diet (via better food choices and also portion control or program like weight watchers) and exercise building up to or more than 30 minutes 5 days per week including some aerobic activity   Pt claims lack of time is biggest barrier Disc food choices she could quickly assemble without cooking

## 2016-08-22 NOTE — Assessment & Plan Note (Signed)
Last level in the 20s Too early to re check  Now on 2000 iu plus ca plus D Will re check in a year Disc imp to bone and overall health

## 2016-08-22 NOTE — Progress Notes (Signed)
Subjective:    Patient ID: Sharon [name], female    DOB: 1950-09-24, 66 y.o.   MRN: [id number]  HPI  Here for welcome to medicare visit  I have personally reviewed the Medicare Annual Wellness questionnaire and have noted 1. The patient's medical and social history 2. Their use of alcohol, tobacco or illicit drugs 3. Their current medications and supplements 4. The patient's functional ability including ADL's, fall risks, home safety risks and hearing or visual             impairment. 5. Diet and physical activities 6. Evidence for depression or mood disorders  The patients weight, height, BMI have been recorded in the chart and visual acuity is per eye clinic.  I have made referrals, counseling and provided education to the patient based review of the above and I have provided the pt with a written personalized care plan for preventive services. Reviewed and updated provider list, see scanned forms.  See scanned forms.  Routine anticipatory guidance given to patient.  See health maintenance. Colon cancer screening 1/15 colonoscopy with nl 10 year recall  Breast cancer screening mammogram 2/17 with gyn -she will do that [facility]) Self breast exam-no lumps  Gyn exam- per pt 2/17 approx  Flu vaccine 10/17 Tetanus vaccine 6/12 dexa -(she has had 2 bone density tests)  -last one improved in 2014 in the normal range and improved  Taking calcium and D  Hx of low D- 29.0 in the fall -- (she takes 2000 iu daily plus her ca plus D)  One fall in the past year (she jumped up too fast from lying down- and got vertigo) - sustained carpet burn/ no broken bones  EKG done 10/17 with NSR and rate of 75  Pneumovax- due for prevnar - will get that today  Declines hep C and HIV screen due to low risk Zoster vaccine-zostavax 2014   Advance directive-she has a living will and poa  Cognitive function addressed- see scanned forms- and if abnormal then additional documentation follows. (mother had  alzheimer) --no worries about her own memory   Doing rehab exercises for her shoulder   PMH and SH reviewed  Meds, vitals, and allergies reviewed.   ROS: See HPI.  Otherwise negative.    Wt Readings from Last 3 Encounters:  08/22/16 203 lb (92.1 kg)  03/13/16 205 lb 4 oz (93.1 kg)  01/31/16 205 lb 4 oz (93.1 kg)  down a few lb  Not eating very healthy- she is eating the wrong things - getting motivated to make changes, not enough time in her schedule to shop and prep  [place]es to [employer] for exercise - goes 2 days per week /some yard work  bmi 32.03  Intake forms scanned into the chart   Hearing Screening             Right ear:   40 40 40  40    Left ear:   40 40 40  40    Vision Screening Comments: Pt had eye exam at [facility] in 02/2016   bp is stable today  No cp or palpitations or headaches or edema  No side effects to medicines  BP Readings from Last 3 Encounters:  08/22/16 118/78  03/13/16 130/90  01/31/16 130/88     Hypothyroidism  Pt has no clinical changes (no symptoms or changes) No change in energy level/ hair or skin/ edema and no tremor Lab Results  Component Value Date  TSH 2.92 01/20/2016     Hx of hyperlipidemia Lab Results  Component Value Date   CHOL 239 (H) 01/20/2016   CHOL 197 09/22/2014   CHOL 216 (H) 11/03/2013   Lab Results  Component Value Date   HDL 58.10 01/20/2016   HDL 44.40 09/22/2014   HDL 50.80 11/03/2013   Lab Results  Component Value Date   LDLCALC 103 (H) 11/03/2013   LDLCALC 160 (H) 09/16/2013   LDLCALC 86 05/09/2006   Lab Results  Component Value Date   TRIG 226.0 (H) 01/20/2016   TRIG 267.0 (H) 09/22/2014   TRIG 309.0 (H) 11/03/2013   Lab Results  Component Value Date   CHOLHDL 4 01/20/2016   CHOLHDL 4 09/22/2014   CHOLHDL 4 11/03/2013   Lab Results  Component Value Date   LDLDIRECT 77.0 01/20/2016   LDLDIRECT 50.0 09/22/2014   LDLDIRECT 118.3  06/03/2013  simvastatin and diet  Not bad HDL is up and LDL is controlled Watching triglycerides  She recently changed diet to less processed carbs     Chemistry      Component Value Date/Time   NA 139 01/20/2016 1037   K 4.0 02/07/2016 1352   CL 101 01/20/2016 1037   CO2 29 01/20/2016 1037   BUN 15 01/20/2016 1037   CREATININE 0.97 01/20/2016 1037      Component Value Date/Time   CALCIUM 9.1 01/20/2016 1037   ALKPHOS 66 01/20/2016 1037   AST 21 01/20/2016 1037   ALT 10 01/20/2016 1037   BILITOT 0.4 01/20/2016 1037      Lab Results  Component Value Date   WBC 6.1 01/20/2016   HGB 12.7 01/20/2016   HCT 37.2 01/20/2016   MCV 92.2 01/20/2016   PLT 285.0 01/20/2016    Will wait to do next labs since they were done in the fall    Patient Active Problem List   Diagnosis Date Noted  . Welcome to Medicare preventive visit 08/22/2016  . Right shoulder pain 01/24/2016  . Obesity 10/05/2014  . Palpitations 03/03/2013  . Colon cancer screening 03/03/2013  . Hypokalemia 10/09/2011  . Routine general medical examination at a health care facility 10/02/2011  . Vitamin D deficiency disease 12/28/2010  . ARTHRALGIA 08/19/2007  . Hypothyroidism 01/31/2007  . Hyperlipidemia 01/31/2007  . ANXIETY 01/31/2007  . Essential hypertension 01/31/2007  . GERD 01/31/2007   Past Medical History:  Diagnosis Date  . Anxiety   . GERD (gastroesophageal reflux disease)   . Hyperlipidemia   . Hypertension   . Hypothyroidism   . Vitamin D deficiency    Past Surgical History:  Procedure Laterality Date  . COLONOSCOPY    . LEEP    . TUBAL LIGATION    . VEIN LIGATION AND STRIPPING     x2   Social History  Substance Use Topics  . Smoking status: Former Smoker    Quit date: 04/24/1996  . Smokeless tobacco: Never Used  . Alcohol use 0.0 oz/week     Comment: occ. (maybe every 3 mos.)   Family History  Problem Relation Age of Onset  . Transient ischemic attack Mother   .  Hypertension Mother   . Hypertension Father   . Colon cancer Neg Hx   . Esophageal cancer Neg Hx   . Rectal cancer Neg Hx   . Stomach cancer Neg Hx    Allergies  Allergen Reactions  . Amlodipine Besylate     REACTION: edema  . Atorvastatin  REACTION: muscle pain  . Neomycin-Bacitracin Zn-Polymyx     REACTION: rash Per pt not sure if from meds or not   Current Outpatient Prescriptions on File Prior to Visit  Medication Sig Dispense Refill  . aspirin 81 MG tablet Take 81 mg by mouth daily.    Marland Kitchen buPROPion (WELLBUTRIN XL) 150 MG 24 hr tablet Take 1 tablet (150 mg total) by mouth daily. 90 tablet 3  . calcium carbonate (TUMS EX) 750 MG chewable tablet Chew 2 tablets by mouth at bedtime.    . Calcium Citrate-Vitamin D 1000-400 LIQD Take 2 tablets by mouth daily.     . Cholecalciferol (VITAMIN D) 1000 UNITS capsule Take 2,000 Units by mouth daily.     . cyclobenzaprine (FLEXERIL) 10 MG tablet Take 0.5-1 tablets (5-10 mg total) by mouth 3 (three) times daily as needed for muscle spasms. 30 tablet 0  . estradiol (ESTRACE) 0.1 MG/GM vaginal cream Use 2 times a week as directed (about 2 cm in applicator).     . hydrochlorothiazide (HYDRODIURIL) 50 MG tablet Take 1 tablet (50 mg total) by mouth daily. 90 tablet 3  . levothyroxine (SYNTHROID, LEVOTHROID) 50 MCG tablet Take 1 tablet (50 mcg total) by mouth daily before breakfast. 90 tablet 3  . losartan (COZAAR) 100 MG tablet Take 1 tablet (100 mg total) by mouth daily. 90 tablet 3  . pantoprazole (PROTONIX) 40 MG tablet Take 1 tablet (40 mg total) by mouth daily. 90 tablet 3  . potassium chloride (K-DUR) 10 MEQ tablet Take 1 tablet (10 mEq total) by mouth daily. 90 tablet 3  . RESTASIS 0.05 % ophthalmic emulsion Place 1 drop into both eyes Twice daily.    . sertraline (ZOLOFT) 100 MG tablet Take 1 tablet (100 mg total) by mouth daily. 90 tablet 3  . simvastatin (ZOCOR) 80 MG tablet Take 1 tablet (80 mg total) by mouth daily. 90 tablet 3   No  current facility-administered medications on file prior to visit.     Review of Systems Review of Systems  Constitutional: Negative for fever, appetite change,  and unexpected weight change.  Eyes: Negative for pain and visual disturbance.  Respiratory: Negative for cough and shortness of breath.   Cardiovascular: Negative for cp or palpitations    Gastrointestinal: Negative for nausea, diarrhea and constipation.  Genitourinary: Negative for urgency and frequency.  Skin: Negative for pallor or rash   Neurological: Negative for weakness, light-headedness, numbness and headaches.  Hematological: Negative for adenopathy. Does not bruise/bleed easily.  Psychiatric/Behavioral: Negative for dysphoric mood. The patient is not nervous/anxious.         Objective:   Physical Exam  Constitutional: She appears well-developed and well-nourished. No distress.  obese and well appearing   HENT:  Head: Normocephalic and atraumatic.  Right Ear: External ear normal.  Left Ear: External ear normal.  Mouth/Throat: Oropharynx is clear and moist.  Eyes: Conjunctivae and EOM are normal. Pupils are equal, round, and reactive to light. No scleral icterus.  Neck: Normal range of motion. Neck supple. No JVD present. Carotid bruit is not present. No thyromegaly present.  Cardiovascular: Normal rate, regular rhythm, normal heart sounds and intact distal pulses.  Exam reveals no gallop.   Pulmonary/Chest: Effort normal and breath sounds normal. No respiratory distress. She has no wheezes. She exhibits no tenderness.  Abdominal: Soft. Bowel sounds are normal. She exhibits no distension, no abdominal bruit and no mass. There is no tenderness.  Genitourinary: No breast swelling, tenderness, discharge or  bleeding.  Genitourinary Comments: Breast exam: No mass, nodules, thickening, tenderness, bulging, retraction, inflamation, nipple discharge or skin changes noted.  No axillary or clavicular LA.      Musculoskeletal:  She exhibits no edema or tenderness.  Limited rom of shoulders-worse on the R  Lymphadenopathy:    She has no cervical adenopathy.  Neurological: She is alert. She has normal reflexes. No cranial nerve deficit. She exhibits normal muscle tone. Coordination normal.  Skin: Skin is warm and dry. No rash noted. No erythema. No pallor.  Lentigines and solar aging noted Some AKs and SKs on arms and hands Fair complexion  Psychiatric: She has a normal mood and affect.          Assessment & Plan:   Problem List Items Addressed This Visit      Cardiovascular and Mediastinum   Essential hypertension - Primary    bp in fair control at this time  BP Readings from Last 1 Encounters:  08/22/16 118/78   No changes needed Disc lifstyle change with low sodium diet and exercise  Labs reviewed Wt loss enc         Endocrine   Hypothyroidism    Hypothyroidism  Pt has no clinical changes No change in energy level/ hair or skin/ edema and no tremor Lab Results  Component Value Date   TSH 2.92 01/20/2016            Other   Hyperlipidemia   Obesity    Discussed how this problem influences overall health and the risks it imposes  Reviewed plan for weight loss with lower calorie diet (via better food choices and also portion control or program like weight watchers) and exercise building up to or more than 30 minutes 5 days per week including some aerobic activity   Pt claims lack of time is biggest barrier Disc food choices she could quickly assemble without cooking        Right shoulder pain    Injection helped short term Could not afford the 40$ copay for PT Plans to return to sport med for a visit       Vitamin D deficiency disease    Last level in the 20s Too early to re check  Now on 2000 iu plus ca plus D Will re check in a year Disc imp to bone and overall health       Welcome to Medicare preventive visit    Reviewed health habits including diet and exercise and  skin cancer prevention Reviewed appropriate screening tests for age  Also reviewed health mt list, fam hx and immunization status , as well as social and family history   Rev vision and hearing  She will [facility] and gyn visit  prevnar today  Has dexa monitored by gyn (no fractures)- disc imp of vit D EKG utd Will consider Shingrix if affordable Disc adv directive-she has one and will get [place] a copy for her chart No memory/cognitive issues  Enc further exercise and wt loss Reviewed labs from the fall            Other Visit Diagnoses    Need for vaccination with 13-polyvalent pneumococcal conjugate vaccine       Relevant Medications   pneumococcal 13-valent conjugate vaccine (PREVNAR 13) injection 0.5 mL (Completed) (Start on 08/22/2016 12:45 PM)

## 2016-08-22 NOTE — Assessment & Plan Note (Signed)
Reviewed health habits including diet and exercise and skin cancer prevention Reviewed appropriate screening tests for age  Also reviewed health mt list, fam hx and immunization status , as well as social and family history   Rev vision and hearing  She will schedule her own mammogram at [facility] and gyn visit  prevnar today  Has dexa monitored by gyn (no fractures)- disc imp of vit D EKG utd Will consider Shingrix if affordable Disc adv directive-she has one and will get [place] a copy for her chart No memory/cognitive issues  Enc further exercise and wt loss Reviewed labs from the fall

## 2016-08-22 NOTE — Assessment & Plan Note (Signed)
bp in fair control at this time  BP Readings from Last 1 Encounters:  08/22/16 118/78   No changes needed Disc lifstyle change with low sodium diet and exercise  Labs reviewed Wt loss enc

## 2016-08-22 NOTE — Patient Instructions (Addendum)
You are due for a mammogram and follow up with gyn  Don't forget to schedule those  The new shingles vaccine is called Shingrix - If you are interested in a shingles/zoster vaccine - call your insurance to check on coverage,( you should not get it within 1 month of other vaccines) , then call us for a prescription  for it to take to a pharmacy that gives the shot , or make a nurse visit to get it here depending on your coverage   Try to get your carbohydrates from the produce section  Eat less sweets/ bread/pasta/rice/ snack foods   prevnar vaccine today

## 2016-08-22 NOTE — Assessment & Plan Note (Signed)
Injection helped short term Could not afford the 40$ copay for PT Plans to return to sport med for a visit

## 2016-08-22 NOTE — Assessment & Plan Note (Signed)
Hypothyroidism  Pt has no clinical changes No change in energy level/ hair or skin/ edema and no tremor Lab Results  Component Value Date   TSH 2.92 01/20/2016

## 2016-11-21 DIAGNOSIS — H2511 Age-related nuclear cataract, right eye: Secondary | ICD-10-CM | POA: Diagnosis not present

## 2016-11-21 DIAGNOSIS — H02839 Dermatochalasis of unspecified eye, unspecified eyelid: Secondary | ICD-10-CM | POA: Diagnosis not present

## 2016-11-21 DIAGNOSIS — H25043 Posterior subcapsular polar age-related cataract, bilateral: Secondary | ICD-10-CM | POA: Diagnosis not present

## 2016-11-21 DIAGNOSIS — H2513 Age-related nuclear cataract, bilateral: Secondary | ICD-10-CM | POA: Diagnosis not present

## 2016-11-21 DIAGNOSIS — H25013 Cortical age-related cataract, bilateral: Secondary | ICD-10-CM | POA: Diagnosis not present

## 2016-12-14 ENCOUNTER — Ambulatory Visit (INDEPENDENT_AMBULATORY_CARE_PROVIDER_SITE_OTHER): Payer: Medicare Other | Admitting: Internal Medicine

## 2016-12-14 ENCOUNTER — Encounter: Payer: Self-pay | Admitting: Internal Medicine

## 2016-12-14 VITALS — BP 122/80 | HR 74 | Temp 98.1°F | Wt 202.0 lb

## 2016-12-14 DIAGNOSIS — S2096XA Insect bite (nonvenomous) of unspecified parts of thorax, initial encounter: Secondary | ICD-10-CM

## 2016-12-14 DIAGNOSIS — W57XXXA Bitten or stung by nonvenomous insect and other nonvenomous arthropods, initial encounter: Secondary | ICD-10-CM | POA: Diagnosis not present

## 2016-12-14 MED ORDER — PERMETHRIN 5 % EX CREA: 1.0000 "application " | TOPICAL_CREAM | Freq: Once | CUTANEOUS | 0 refills | Status: AC

## 2016-12-14 NOTE — Progress Notes (Signed)
Subjective:    Patient ID: Sharon [name], female    DOB: Mar 11, 1951, 66 y.o.   MRN: [id number]  HPI  Pt presents to the clinic today with c/o rash. She reports this started 5 days ago. The rash is located all over her body. It is very itchy. She denies coming in contact with anything that she is allergic too. She denies recent changes in soaps, lotions or detergents. She denies changes in diet or medication. She has tried Benadryl with minimal improvement.   Review of Systems  Past Medical History:  Diagnosis Date  . Anxiety   . GERD (gastroesophageal reflux disease)   . Hyperlipidemia   . Hypertension   . Hypothyroidism   . Vitamin D deficiency     Current Outpatient Prescriptions  Medication Sig Dispense Refill  . aspirin 81 MG tablet Take 81 mg by mouth daily.    Marland Kitchen buPROPion (WELLBUTRIN XL) 150 MG 24 hr tablet Take 1 tablet (150 mg total) by mouth daily. 90 tablet 3  . calcium carbonate (TUMS EX) 750 MG chewable tablet Chew 2 tablets by mouth at bedtime.    . Calcium Citrate-Vitamin D 1000-400 LIQD Take 2 tablets by mouth daily.     . Cholecalciferol (VITAMIN D) 1000 UNITS capsule Take 2,000 Units by mouth daily.     . cyclobenzaprine (FLEXERIL) 10 MG tablet Take 0.5-1 tablets (5-10 mg total) by mouth 3 (three) times daily as needed for muscle spasms. 30 tablet 0  . estradiol (ESTRACE) 0.1 MG/GM vaginal cream Use 2 times a week as directed (about 2 cm in applicator).     . fluticasone (FLONASE) 50 MCG/ACT nasal spray USE 2 SPRAYS INTO EACH NOSTRIL DAILY. 16 g 11  . hydrochlorothiazide (HYDRODIURIL) 50 MG tablet Take 1 tablet (50 mg total) by mouth daily. 90 tablet 3  . levothyroxine (SYNTHROID, LEVOTHROID) 50 MCG tablet Take 1 tablet (50 mcg total) by mouth daily before breakfast. 90 tablet 3  . losartan (COZAAR) 100 MG tablet Take 1 tablet (100 mg total) by mouth daily. 90 tablet 3  . pantoprazole (PROTONIX) 40 MG tablet Take 1 tablet (40 mg total) by mouth daily. 90 tablet  3  . potassium chloride (K-DUR) 10 MEQ tablet Take 1 tablet (10 mEq total) by mouth daily. 90 tablet 3  . RESTASIS 0.05 % ophthalmic emulsion Place 1 drop into both eyes Twice daily.    . sertraline (ZOLOFT) 100 MG tablet Take 1 tablet (100 mg total) by mouth daily. 90 tablet 3  . simvastatin (ZOCOR) 80 MG tablet Take 1 tablet (80 mg total) by mouth daily. 90 tablet 3   No current facility-administered medications for this visit.     Allergies  Allergen Reactions  . Amlodipine Besylate     REACTION: edema  . Atorvastatin     REACTION: muscle pain  . Neomycin-Bacitracin Zn-Polymyx     REACTION: rash Per pt not sure if from meds or not    Family History  Problem Relation Age of Onset  . Transient ischemic attack Mother   . Hypertension Mother   . Hypertension Father   . Colon cancer Neg Hx   . Esophageal cancer Neg Hx   . Rectal cancer Neg Hx   . Stomach cancer Neg Hx     Social History   Social History  . Marital status: Married    Spouse name: N/A  . Number of children: N/A  . Years of education: N/A   Occupational History  .  Not on file.   Social History Main Topics  . Smoking status: Former Smoker    Quit date: 04/24/1996  . Smokeless tobacco: Never Used  . Alcohol use 0.0 oz/week     Comment: occ. (maybe every 3 mos.)  . Drug use: No  . Sexual activity: Not on file   Other Topics Concern  . Not on file   Social History Narrative  . No narrative on file     Constitutional: Denies fever, malaise, fatigue, headache or abrupt weight changes.  Skin: Pt reports rash. Denies  ulcercations.   No other specific complaints in a complete review of systems (except as listed in HPI above).     Objective:   Physical Exam BP 122/80   Pulse 74   Temp 98.1 F (36.7 C) (Oral)   Wt 202 lb (91.6 kg)   SpO2 98%   BMI 31.88 kg/m  Wt Readings from Last 3 Encounters:  12/14/16 202 lb (91.6 kg)  08/22/16 203 lb (92.1 kg)  03/13/16 205 lb 4 oz (93.1 kg)     General: Appears her stated age, in NAD. Skin: Scattered papular lesion on an erythematous base, scattered on bilateral legs, back and abdomen.   BMET    Component Value Date/Time   NA 139 01/20/2016 1037   K 4.0 02/07/2016 1352   CL 101 01/20/2016 1037   CO2 29 01/20/2016 1037   GLUCOSE 94 01/20/2016 1037   BUN 15 01/20/2016 1037   CREATININE 0.97 01/20/2016 1037   CALCIUM 9.1 01/20/2016 1037   GFRNONAA 61 03/02/2008 1047   GFRAA 73 03/02/2008 1047    Lipid Panel     Component Value Date/Time   CHOL 239 (H) 01/20/2016 1037   TRIG 226.0 (H) 01/20/2016 1037   HDL 58.10 01/20/2016 1037   CHOLHDL 4 01/20/2016 1037   VLDL 45.2 (H) 01/20/2016 1037   LDLCALC 103 (H) 11/03/2013 0925    CBC    Component Value Date/Time   WBC 6.1 01/20/2016 1037   RBC 4.04 01/20/2016 1037   HGB 12.7 01/20/2016 1037   HCT 37.2 01/20/2016 1037   PLT 285.0 01/20/2016 1037   MCV 92.2 01/20/2016 1037   MCHC 34.1 01/20/2016 1037   RDW 14.2 01/20/2016 1037   LYMPHSABS 2.2 01/20/2016 1037   MONOABS 0.4 01/20/2016 1037   EOSABS 0.3 01/20/2016 1037   BASOSABS 0.0 01/20/2016 1037    Hgb A1C No results found for: HGBA1C           Assessment & Plan:   Bug Bites:  ? Chiggers vs bedbugs eRx for Permethrin cream, use as directed Continue Benadryl as needed  Return precautions discussed Nicki Reaper, NP

## 2016-12-14 NOTE — Patient Instructions (Signed)

## 2016-12-29 DIAGNOSIS — H2512 Age-related nuclear cataract, left eye: Secondary | ICD-10-CM | POA: Diagnosis not present

## 2016-12-29 DIAGNOSIS — Z961 Presence of intraocular lens: Secondary | ICD-10-CM | POA: Diagnosis not present

## 2016-12-29 DIAGNOSIS — H2511 Age-related nuclear cataract, right eye: Secondary | ICD-10-CM | POA: Diagnosis not present

## 2016-12-29 DIAGNOSIS — H25811 Combined forms of age-related cataract, right eye: Secondary | ICD-10-CM | POA: Diagnosis not present

## 2016-12-29 HISTORY — PX: CATARACT EXTRACTION W/ INTRAOCULAR LENS IMPLANT: SHX1309

## 2017-01-10 ENCOUNTER — Other Ambulatory Visit: Payer: Self-pay | Admitting: Family Medicine

## 2017-01-15 DIAGNOSIS — H2512 Age-related nuclear cataract, left eye: Secondary | ICD-10-CM | POA: Diagnosis not present

## 2017-01-15 DIAGNOSIS — Z961 Presence of intraocular lens: Secondary | ICD-10-CM | POA: Diagnosis not present

## 2017-01-15 HISTORY — PX: CATARACT EXTRACTION W/ INTRAOCULAR LENS IMPLANT: SHX1309

## 2017-02-08 ENCOUNTER — Ambulatory Visit (INDEPENDENT_AMBULATORY_CARE_PROVIDER_SITE_OTHER): Payer: Medicare Other

## 2017-02-08 DIAGNOSIS — Z23 Encounter for immunization: Secondary | ICD-10-CM | POA: Diagnosis not present

## 2017-03-05 DIAGNOSIS — Z961 Presence of intraocular lens: Secondary | ICD-10-CM | POA: Diagnosis not present

## 2017-03-05 DIAGNOSIS — H2512 Age-related nuclear cataract, left eye: Secondary | ICD-10-CM | POA: Diagnosis not present

## 2017-03-05 DIAGNOSIS — H2511 Age-related nuclear cataract, right eye: Secondary | ICD-10-CM | POA: Diagnosis not present

## 2017-03-22 DIAGNOSIS — Z01419 Encounter for gynecological examination (general) (routine) without abnormal findings: Secondary | ICD-10-CM | POA: Diagnosis not present

## 2017-03-22 DIAGNOSIS — Z124 Encounter for screening for malignant neoplasm of cervix: Secondary | ICD-10-CM | POA: Diagnosis not present

## 2017-03-23 DIAGNOSIS — Z1231 Encounter for screening mammogram for malignant neoplasm of breast: Secondary | ICD-10-CM | POA: Diagnosis not present

## 2017-06-07 ENCOUNTER — Other Ambulatory Visit: Payer: Self-pay | Admitting: Family Medicine

## 2017-06-07 NOTE — Telephone Encounter (Signed)
Please schedule amw and PE with labs prior after may 1 and refill all medicines until then

## 2017-06-07 NOTE — Telephone Encounter (Signed)
Last office visit 12/14/2016 with R. Baity for bug bite.  No labs since 02/07/2016.  No future appointments scheduled.  Refill?

## 2017-06-11 ENCOUNTER — Telehealth: Payer: Self-pay

## 2017-06-11 NOTE — Telephone Encounter (Signed)
Attempted to reach patient to schedule AWV and CPE. Left message on home phone with contact information. If patient returns call, please transfer to [facility] @ [phone]

## 2017-06-11 NOTE — Telephone Encounter (Signed)
Despina PoleLisa P. Or Allison will schedule appts., meds refilled

## 2017-08-27 ENCOUNTER — Ambulatory Visit (INDEPENDENT_AMBULATORY_CARE_PROVIDER_SITE_OTHER): Payer: Medicare Other

## 2017-08-27 VITALS — BP 118/84 | HR 75 | Temp 99.0°F | Ht 66.5 in | Wt 207.2 lb

## 2017-08-27 DIAGNOSIS — I10 Essential (primary) hypertension: Secondary | ICD-10-CM | POA: Diagnosis not present

## 2017-08-27 DIAGNOSIS — E039 Hypothyroidism, unspecified: Secondary | ICD-10-CM | POA: Diagnosis not present

## 2017-08-27 DIAGNOSIS — E559 Vitamin D deficiency, unspecified: Secondary | ICD-10-CM | POA: Diagnosis not present

## 2017-08-27 DIAGNOSIS — E782 Mixed hyperlipidemia: Secondary | ICD-10-CM | POA: Diagnosis not present

## 2017-08-27 DIAGNOSIS — E2839 Other primary ovarian failure: Secondary | ICD-10-CM | POA: Diagnosis not present

## 2017-08-27 DIAGNOSIS — Z23 Encounter for immunization: Secondary | ICD-10-CM | POA: Diagnosis not present

## 2017-08-27 DIAGNOSIS — Z1159 Encounter for screening for other viral diseases: Secondary | ICD-10-CM | POA: Diagnosis not present

## 2017-08-27 DIAGNOSIS — Z0000 Encounter for general adult medical examination without abnormal findings: Secondary | ICD-10-CM | POA: Diagnosis not present

## 2017-08-27 LAB — LIPID PANEL
Cholesterol: 190 mg/dL (ref 0–200)
HDL: 55.5 mg/dL (ref >39.00)
NonHDL: 134.56
Total CHOL/HDL Ratio: 3
Triglycerides: 219 mg/dL — ABNORMAL HIGH (ref 0.0–149.0)
VLDL: 43.8 mg/dL — ABNORMAL HIGH (ref 0.0–40.0)

## 2017-08-27 LAB — COMPREHENSIVE METABOLIC PANEL
ALBUMIN: 4.2 g/dL (ref 3.5–5.2)
ALK PHOS: 67 U/L (ref 39–117)
ALT: 13 U/L (ref 0–35)
AST: 22 U/L (ref 0–37)
BUN: 13 mg/dL (ref 6–23)
CALCIUM: 10.2 mg/dL (ref 8.4–10.5)
CO2: 30 mEq/L (ref 19–32)
CREATININE: 0.94 mg/dL (ref 0.40–1.20)
Chloride: 100 mEq/L (ref 96–112)
GFR: 63.2 mL/min (ref >60.00)
Glucose, Bld: 84 mg/dL (ref 70–99)
Potassium: 4 mEq/L (ref 3.5–5.1)
Sodium: 138 mEq/L (ref 135–145)
TOTAL PROTEIN: 7.3 g/dL (ref 6.0–8.3)
Total Bilirubin: 0.4 mg/dL (ref 0.2–1.2)

## 2017-08-27 LAB — TSH: TSH: 2.44 u[IU]/mL (ref 0.35–4.50)

## 2017-08-27 LAB — CBC WITH DIFFERENTIAL/PLATELET
Basophils Absolute: 0 10*3/uL (ref 0.0–0.1)
Basophils Relative: 0.8 % (ref 0.0–3.0)
EOS ABS: 0.2 10*3/uL (ref 0.0–0.7)
Eosinophils Relative: 3.7 % (ref 0.0–5.0)
HEMATOCRIT: 38.6 % (ref 36.0–46.0)
Hemoglobin: 13.2 g/dL (ref 12.0–15.0)
LYMPHS PCT: 33.7 % (ref 12.0–46.0)
Lymphs Abs: 2 10*3/uL (ref 0.7–4.0)
MCHC: 34.3 g/dL (ref 30.0–36.0)
MCV: 94.6 fl (ref 78.0–100.0)
MONOS PCT: 8 % (ref 3.0–12.0)
Monocytes Absolute: 0.5 10*3/uL (ref 0.1–1.0)
NEUTROS PCT: 53.8 % (ref 43.0–77.0)
Neutro Abs: 3.2 10*3/uL (ref 1.4–7.7)
PLATELETS: 247 10*3/uL (ref 150.0–400.0)
RBC: 4.08 Mil/uL (ref 3.87–5.11)
RDW: 13.9 % (ref 11.5–15.5)
WBC: 5.9 10*3/uL (ref 4.0–10.5)

## 2017-08-27 LAB — LDL CHOLESTEROL, DIRECT: Direct LDL: 58 mg/dL

## 2017-08-27 LAB — VITAMIN D 25 HYDROXY (VIT D DEFICIENCY, FRACTURES): VITD: 29.38 ng/mL — AB (ref 30.00–100.00)

## 2017-08-27 NOTE — Progress Notes (Signed)
Subjective:   Sharon [name] is a 67 y.o. female who presents for an Initial Medicare Annual Wellness Visit.  Review of Systems    N/A  Cardiac Risk Factors include: advanced age (>43men, >84 women);obesity (BMI >30kg/m2);dyslipidemia;hypertension     Objective:    Today's Vitals   08/27/17 1217 08/27/17 1218  BP: 118/84   Pulse: 75   Temp: 99 F (37.2 C)   TempSrc: Oral   SpO2: 96%   Weight: 207 lb 4 oz (94 kg)   Height: 5' 6.5" (1.689 m)   PainSc: 2  2   PainLoc: Back    Body mass index is 32.95 kg/m.  Advanced Directives 08/27/2017  Does Patient Have a Medical Advance Directive? Yes  Type of Estate agent of [place];Living will  Copy of Healthcare Power of Attorney in Chart? No - copy requested    Current Medications (verified) Outpatient Encounter Medications as of 08/27/2017  Medication Sig  . aspirin 81 MG tablet Take 81 mg by mouth daily.  Marland Kitchen buPROPion (WELLBUTRIN XL) 150 MG 24 hr tablet TAKE 1 TABLET BY MOUTH  DAILY  . calcium carbonate (TUMS EX) 750 MG chewable tablet Chew 2 tablets by mouth at bedtime.  . Calcium Citrate-Vitamin D 1000-400 LIQD Take 2 tablets by mouth daily.   . Cholecalciferol (VITAMIN D) 1000 UNITS capsule Take 2,000 Units by mouth daily.   . cyclobenzaprine (FLEXERIL) 10 MG tablet Take 0.5-1 tablets (5-10 mg total) by mouth 3 (three) times daily as needed for muscle spasms.  Marland Kitchen estradiol (ESTRACE) 0.1 MG/GM vaginal cream Use 2 times a week as directed (about 2 cm in applicator).   . fluticasone (FLONASE) 50 MCG/ACT nasal spray USE 2 SPRAYS INTO EACH NOSTRIL DAILY.  . hydrochlorothiazide (HYDRODIURIL) 50 MG tablet TAKE 1 TABLET BY MOUTH  DAILY  . levothyroxine (SYNTHROID, LEVOTHROID) 50 MCG tablet TAKE 1 TABLET BY MOUTH  DAILY BEFORE BREAKFAST  . losartan (COZAAR) 100 MG tablet TAKE 1 TABLET BY MOUTH  DAILY  . pantoprazole (PROTONIX) 40 MG tablet TAKE 1 TABLET BY MOUTH  DAILY  . potassium chloride (K-DUR,KLOR-CON) 10  MEQ tablet TAKE 1 TABLET BY MOUTH  DAILY  . RESTASIS 0.05 % ophthalmic emulsion Place 1 drop into both eyes Twice daily.  . sertraline (ZOLOFT) 100 MG tablet TAKE 1 TABLET BY MOUTH  DAILY  . simvastatin (ZOCOR) 80 MG tablet TAKE 1 TABLET BY MOUTH  DAILY   No facility-administered encounter medications on file as of 08/27/2017.     Allergies (verified) Amlodipine besylate; Atorvastatin; and Neomycin-bacitracin zn-polymyx   History: Past Medical History:  Diagnosis Date  . Anxiety   . Cataract   . GERD (gastroesophageal reflux disease)   . Hyperlipidemia   . Hypertension   . Hypothyroidism   . Vitamin D deficiency    Past Surgical History:  Procedure Laterality Date  . CATARACT EXTRACTION W/ INTRAOCULAR LENS IMPLANT Right 12/29/2016   Dr. Mia Creek  . CATARACT EXTRACTION W/ INTRAOCULAR LENS IMPLANT Left 01/15/2017   Dr. Mia Creek  . COLONOSCOPY    . LEEP    . TUBAL LIGATION    . VEIN LIGATION AND STRIPPING     x2   Family History  Problem Relation Age of Onset  . Transient ischemic attack Mother   . Hypertension Mother   . Hypertension Father   . Colon cancer Neg Hx   . Esophageal cancer Neg Hx   . Rectal cancer Neg Hx   . Stomach  cancer Neg Hx    Social History   Socioeconomic History  . Marital status: Married    Spouse name: Not on file  . Number of children: Not on file  . Years of education: Not on file  . Highest education level: Not on file  Occupational History  . Not on file  Social Needs  . Financial resource strain: Not on file  . Food insecurity:    Worry: Not on file    Inability: Not on file  . Transportation needs:    Medical: Not on file    Non-medical: Not on file  Tobacco Use  . Smoking status: Former Smoker    Last attempt to quit: 04/24/1996    Years since quitting: 21.3  . Smokeless tobacco: Never Used  Substance and Sexual Activity  . Alcohol use: Yes    Alcohol/week: 0.0 oz    Comment: occ. (maybe every 3 mos.)  . Drug  use: No  . Sexual activity: Not on file  Lifestyle  . Physical activity:    Days per week: Not on file    Minutes per session: Not on file  . Stress: Not on file  Relationships  . Social connections:    Talks on phone: Not on file    Gets together: Not on file    Attends religious service: Not on file    Active member of club or organization: Not on file    Attends meetings of clubs or organizations: Not on file    Relationship status: Not on file  Other Topics Concern  . Not on file  Social History Narrative  . Not on file    Tobacco Counseling Counseling given: No   Clinical Intake:  Pre-visit preparation completed: Yes  Pain : 0-10 Pain Score: 2  Pain Type: Acute pain Pain Location: Back Pain Orientation: Lower Pain Onset: Yesterday Pain Frequency: Constant     Nutritional Status: BMI > 30  Obese Nutritional Risks: None Diabetes: No  How often do you need to have someone help you when you read instructions, pamphlets, or other written materials from your doctor or pharmacy?: 1 - Never What is the last grade level you completed in school?: Bachelors degree  Interpreter Needed?: No  Comments: pt lives with spouse Information entered by :: LPinson, LPN   Activities of Daily Living In your present state of health, do you have any difficulty performing the following activities: 08/27/2017  Hearing? N  Vision? N  Difficulty concentrating or making decisions? N  Walking or climbing stairs? N  Dressing or bathing? N  Doing errands, shopping? N  Preparing Food and eating ? N  Using the Toilet? N  In the past six months, have you accidently leaked urine? Y  Do you have problems with loss of bowel control? Y  Managing your Medications? N  Managing your Finances? N  Housekeeping or managing your Housekeeping? N  Some recent data might be hidden     Immunizations and Health Maintenance Immunization History  Administered Date(s) Administered  . Influenza  Split 03/29/2011, 03/12/2012  . Influenza,inj,Quad PF,6+ Mos 03/03/2013, 03/25/2014, 01/24/2016, 02/08/2017  . Pneumococcal Conjugate-13 08/22/2016  . Pneumococcal Polysaccharide-23 08/27/2017  . Td 04/24/1998  . Zoster 04/10/2013   There are no preventive care reminders to display for this patient.  Patient Care Team: Tower, Audrie Gallus, MD as PCP - General  Indicate any recent Medical Services you may have received from other than [facility] providers in the past year (date  may be approximate).     Assessment:   This is a routine wellness examination for [place].   Hearing Screening             Right ear:   40 40 40  40    Left ear:   40 40 40  40    Vision Screening Comments: Last vision exam in December 2018 with Dr. Druscilla Brownie B.    Hearing/Vision screen  Hearing Screening             Right ear:   40 40 40  40    Left ear:   40 40 40  40    Vision Screening Comments: Last vision exam in December 2018 with Dr. Druscilla Brownie B.   Dietary issues and exercise activities discussed: Current Exercise Habits: Structured exercise class, Time (Minutes): 60, Frequency (Times/Week): 2, Weekly Exercise (Minutes/Week): 120, Intensity: Moderate, Exercise limited by: None identified  Goals    . Increase physical activity     Starting 08/27/2017, I will continue to attend [employer] for 60 minutes twice weekly.       Depression Screen PHQ 2/9 Scores 08/27/2017 08/22/2016 01/24/2016  PHQ - 2 Score 0 0 0  PHQ- 9 Score 0 - -    Fall Risk Fall Risk  08/27/2017 08/22/2016 01/24/2016  Falls in the past year? No Yes Yes  Number falls in past yr: - 1 1  Injury with Fall? - Yes No    Cognitive Function: MMSE - Mini Mental State Exam 08/27/2017  Orientation to time 5  Orientation to Place 5  Registration 3  Attention/ Calculation 0  Recall 3  Language- name 2 objects 0  Language- repeat 1  Language- follow 3  step command 3  Language- read & follow direction 0  Write a sentence 0  Copy design 0  Total score 20     PLEASE NOTE: A Mini-Cog screen was completed. Maximum score is 20. A value of 0 denotes this part of [place] MMSE was not completed or the patient failed this part of the Mini-Cog screening.   Mini-Cog Screening Orientation to Time - Max 5 pts Orientation to Place - Max 5 pts Registration - Max 3 pts Recall - Max 3 pts Language Repeat - Max 1 pts Language Follow 3 Step Command - Max 3 pts     Screening Tests Health Maintenance  Topic Date Due  . MAMMOGRAM  04/23/2018 (Originally 05/25/2017)  . INFLUENZA VACCINE  11/22/2017  . TETANUS/TDAP  10/08/2020  . COLONOSCOPY  05/20/2023  . DEXA SCAN  Completed  . Hepatitis C Screening  Completed  . PNA vac Low Risk Adult  Completed      Plan:     I have personally reviewed, addressed, and noted the following in the patient's chart:  A. Medical and social history B. Use of alcohol, tobacco or illicit drugs  C. Current medications and supplements D. Functional ability and status E.  Nutritional status F.  Physical activity G. Advance directives H. List of other physicians I.  Hospitalizations, surgeries, and ER visits in previous 12 months J.  Vitals K. Screenings to include hearing, vision, cognitive, depression L. Referrals and appointments - none  In addition, I have reviewed and discussed with patient certain preventive protocols, quality metrics, and best practice recommendations. A written personalized care plan for preventive services as well as general preventive health recommendations were provided to patient.  See attached scanned questionnaire for additional information.   Signed,  Lindell Noe, MHA, BS, LPN Health Coach

## 2017-08-27 NOTE — Progress Notes (Signed)
PCP notes:   Health maintenance:  Mammogram - addressed Bone density - recommended by GYN; order placed Hep C screening - ordered  PPSV23 - administered  Abnormal screenings:   None  Patient concerns:   Fecal incontinence - 2 episodes within past 12 mths  Nurse concerns:  None  Next PCP appt:   09/03/17 @ 1045  I reviewed health advisor's note, was available for consultation, and agree with documentation and plan. Roxy Manns MD

## 2017-08-27 NOTE — Patient Instructions (Signed)
Sharon [name] , Thank you for taking time to come for your Medicare Wellness Visit. I appreciate your ongoing commitment to your health goals. Please review the following plan we discussed and let me know if I can assist you in the future.   These are the goals we discussed: Goals    . Increase physical activity     Starting 08/27/2017, I will continue to attend [employer] for 60 minutes twice weekly.        This is a list of the screening recommended for you and due dates:  Health Maintenance  Topic Date Due  . Mammogram  04/23/2018*  . Flu Shot  11/22/2017  . Tetanus Vaccine  10/08/2020  . Colon Cancer Screening  05/20/2023  . DEXA scan (bone density measurement)  Completed  .  Hepatitis C: One time screening is recommended by Center for Disease Control  (CDC) for  adults born from [birth year] through 1965.   Completed  . Pneumonia vaccines  Completed  *Topic was postponed. The date shown is not the original due date.   Preventive Care for Adults  A healthy lifestyle and preventive care can promote health and wellness. Preventive health guidelines for adults include the following key practices.  . A routine yearly physical is a good way to check with your health care provider about your health and preventive screening. It is a chance to share any concerns and updates on your health and to receive a thorough exam.  . Visit your dentist for a routine exam and preventive care every 6 months. Brush your teeth twice a day and floss once a day. Good oral hygiene prevents tooth decay and gum disease.  . The frequency of eye exams is based on your age, health, family medical history, use  of contact lenses, and other factors. Follow your health care provider's recommendations for frequency of eye exams.  . Eat a healthy diet. Foods like vegetables, fruits, whole grains, low-fat dairy products, and lean protein foods contain the nutrients you need without too many calories. Decrease your intake  of foods high in solid fats, added sugars, and salt. Eat the right amount of calories for you. Get information about a proper diet from your health care provider, if necessary.  . Regular physical exercise is one of the most important things you can do for your health. Most adults should get at least 150 minutes of moderate-intensity exercise (any activity that increases your heart rate and causes you to sweat) each week. In addition, most adults need muscle-strengthening exercises on 2 or more days a week.  Silver Sneakers may be a benefit available to you. To determine eligibility, you may visit the website: www.silversneakers.com or contact program at [phone] Mon-Fri between 8AM-8PM.   . Maintain a healthy weight. The body mass index (BMI) is a screening tool to identify possible weight problems. It provides an estimate of body fat based on height and weight. Your health care provider can find your BMI and can help you achieve or maintain a healthy weight.   For adults 20 years and older: ? A BMI below 18.5 is considered underweight. ? A BMI of 18.5 to 24.9 is normal. ? A BMI of 25 to 29.9 is considered overweight. ? A BMI of 30 and above is considered obese.   . Maintain normal blood lipids and cholesterol levels by exercising and minimizing your intake of saturated fat. Eat a balanced diet with plenty of fruit and vegetables. Blood tests for lipids  and cholesterol should begin at age 52 and be repeated every 5 years. If your lipid or cholesterol levels are high, you are over 50, or you are at high risk for heart disease, you may need your cholesterol levels checked more frequently. Ongoing high lipid and cholesterol levels should be treated with medicines if diet and exercise are not working.  . If you smoke, find out from your health care provider how to quit. If you do not use tobacco, please do not start.  . If you choose to drink alcohol, please do not consume more than 2 drinks  per day. One drink is considered to be 12 ounces (355 mL) of beer, 5 ounces (148 mL) of wine, or 1.5 ounces (44 mL) of liquor.  . If you are 32-47 years old, ask your health care provider if you should take aspirin to prevent strokes.  . Use sunscreen. Apply sunscreen liberally and repeatedly throughout the day. You should seek shade when your shadow is shorter than you. Protect yourself by wearing long sleeves, pants, a wide-brimmed hat, and sunglasses year round, whenever you are outdoors.  . Once a month, do a whole body skin exam, using a mirror to look at the skin on your back. Tell your health care provider of new moles, moles that have irregular borders, moles that are larger than a pencil eraser, or moles that have changed in shape or color.

## 2017-09-01 ENCOUNTER — Other Ambulatory Visit: Payer: Self-pay | Admitting: Family Medicine

## 2017-09-03 ENCOUNTER — Encounter: Payer: Self-pay | Admitting: Family Medicine

## 2017-09-03 ENCOUNTER — Ambulatory Visit (INDEPENDENT_AMBULATORY_CARE_PROVIDER_SITE_OTHER): Payer: Medicare Other | Admitting: Family Medicine

## 2017-09-03 VITALS — BP 110/62 | HR 82 | Temp 98.6°F | Ht 66.5 in | Wt 207.5 lb

## 2017-09-03 DIAGNOSIS — E6609 Other obesity due to excess calories: Secondary | ICD-10-CM

## 2017-09-03 DIAGNOSIS — Z0000 Encounter for general adult medical examination without abnormal findings: Secondary | ICD-10-CM | POA: Diagnosis not present

## 2017-09-03 DIAGNOSIS — Z6832 Body mass index (BMI) 32.0-32.9, adult: Secondary | ICD-10-CM | POA: Diagnosis not present

## 2017-09-03 DIAGNOSIS — E782 Mixed hyperlipidemia: Secondary | ICD-10-CM | POA: Diagnosis not present

## 2017-09-03 DIAGNOSIS — Z1211 Encounter for screening for malignant neoplasm of colon: Secondary | ICD-10-CM | POA: Diagnosis not present

## 2017-09-03 DIAGNOSIS — E559 Vitamin D deficiency, unspecified: Secondary | ICD-10-CM

## 2017-09-03 DIAGNOSIS — I10 Essential (primary) hypertension: Secondary | ICD-10-CM

## 2017-09-03 DIAGNOSIS — E038 Other specified hypothyroidism: Secondary | ICD-10-CM

## 2017-09-03 MED ORDER — BUPROPION HCL ER (XL) 150 MG PO TB24: 150.0000 mg | ORAL_TABLET | Freq: Every day | ORAL | 3 refills | Status: DC

## 2017-09-03 MED ORDER — SERTRALINE HCL 100 MG PO TABS: 100.0000 mg | ORAL_TABLET | Freq: Every day | ORAL | 3 refills | Status: DC

## 2017-09-03 MED ORDER — HYDROCHLOROTHIAZIDE 50 MG PO TABS: 50.0000 mg | ORAL_TABLET | Freq: Every day | ORAL | 3 refills | Status: DC

## 2017-09-03 MED ORDER — LEVOTHYROXINE SODIUM 50 MCG PO TABS: ORAL_TABLET | ORAL | 3 refills | Status: DC

## 2017-09-03 MED ORDER — POTASSIUM CHLORIDE CRYS ER 10 MEQ PO TBCR: 10.0000 meq | EXTENDED_RELEASE_TABLET | Freq: Every day | ORAL | 3 refills | Status: DC

## 2017-09-03 MED ORDER — SIMVASTATIN 80 MG PO TABS: 80.0000 mg | ORAL_TABLET | Freq: Every day | ORAL | 3 refills | Status: DC

## 2017-09-03 MED ORDER — PANTOPRAZOLE SODIUM 40 MG PO TBEC: 40.0000 mg | DELAYED_RELEASE_TABLET | Freq: Every day | ORAL | 3 refills | Status: DC

## 2017-09-03 MED ORDER — LOSARTAN POTASSIUM 100 MG PO TABS: 100.0000 mg | ORAL_TABLET | Freq: Every day | ORAL | 3 refills | Status: DC

## 2017-09-03 NOTE — Assessment & Plan Note (Signed)
Level is low  Will inc dose to 2000 iu or more a day (was not taking it)  Disc imp to bone and overall health

## 2017-09-03 NOTE — Assessment & Plan Note (Signed)
Hypothyroidism  Pt has no clinical changes No change in energy level/ hair or skin/ edema and no tremor Lab Results  Component Value Date   TSH 2.44 08/27/2017

## 2017-09-03 NOTE — Patient Instructions (Addendum)
Your vitamin D level is low -please increase your daily D to at least 2000 iu per day  Keep exercising for bone healthy   Keep cutting calories until you begin to loose weight   Take care of yourself

## 2017-09-03 NOTE — Assessment & Plan Note (Signed)
bp in fair control at this time  BP Readings from Last 1 Encounters:  09/03/17 110/62   No changes needed Most recent labs reviewed  Disc lifstyle change with low sodium diet and exercise

## 2017-09-03 NOTE — Assessment & Plan Note (Signed)
Disc goals for lipids and reasons to control them Rev last labs with pt Rev low sat fat diet in detail Improved LDL and trig  Commended

## 2017-09-03 NOTE — Assessment & Plan Note (Signed)
Discussed how this problem influences overall health and the risks it imposes  Reviewed plan for weight loss with lower calorie diet (via better food choices and also portion control or program like weight watchers) and exercise building up to or more than 30 minutes 5 days per week including some aerobic activity   Disc logging in or calorie counting for accountability  Also suggested weight watchers

## 2017-09-03 NOTE — Assessment & Plan Note (Signed)
Reviewed health habits including diet and exercise and skin cancer prevention Reviewed appropriate screening tests for age  Also reviewed health mt list, fam hx and immunization status , as well as social and family history   See HPI  amw reviewed  Will do hep C screen next year and ppsv23 given  dexa ordered  Wt loss strategies disc /lifestyle change  Enc to inc vit D to 2000 iu daily

## 2017-09-03 NOTE — Assessment & Plan Note (Signed)
utd Colonoscopy in 2015 with 10 y recall

## 2017-09-03 NOTE — Progress Notes (Signed)
Subjective:    Patient ID: Sharon [name], female    DOB: 11/16/1950, 67 y.o.   MRN: [id number]  HPI  Here for health maintenance exam and to review chronic medical problems    Wt Readings from Last 3 Encounters:  09/03/17 207 lb 8 oz (94.1 kg)  08/27/17 207 lb 4 oz (94 kg)  12/14/16 202 lb (91.6 kg)  doing fair with diet (needs to eat more veggies and /fruits)  [facility] closed [facility]  32.99 kg/m   Had amw visit  dexa order placed (she goes to [facility])  Hep C ordered - but by mistake not done = she will wait until next year  PPSV 23 given   Mammogram -had at [facility] she thinks in feb  Self breast exam - no lumps  Sees Dr Tildon Husky for her gyn (has had breast and pelvic exam w/in the last year)  Colonoscopy 1/15 with 10 y recall   dexa 1/14 -nl range No falls or fx D level 29.3 She is supposed to take 2 supplements daily - only takes one (1000iu)    zostavax 12/14  bp is stable today  No cp or palpitations or headaches or edema  No side effects to medicines  BP Readings from Last 3 Encounters:  09/03/17 110/62  08/27/17 118/84  12/14/16 122/80     Lab Results  Component Value Date   CREATININE 0.94 08/27/2017   BUN 13 08/27/2017   NA 138 08/27/2017   K 4.0 08/27/2017   CL 100 08/27/2017   CO2 30 08/27/2017  good glucose at 84   Lab Results  Component Value Date   ALT 13 08/27/2017   AST 22 08/27/2017   ALKPHOS 67 08/27/2017   BILITOT 0.4 08/27/2017   Lab Results  Component Value Date   WBC 5.9 08/27/2017   HGB 13.2 08/27/2017   HCT 38.6 08/27/2017   MCV 94.6 08/27/2017   PLT 247.0 08/27/2017   Hypothyroidism  Pt has no clinical changes No change in energy level/ hair or skin/ edema and no tremor Lab Results  Component Value Date   TSH 2.44 08/27/2017     Hyperlipidemia Lab Results  Component Value Date   CHOL 190 08/27/2017   CHOL 239 (H) 01/20/2016   CHOL 197 09/22/2014   Lab Results    Component Value Date   HDL 55.50 08/27/2017   HDL 58.10 01/20/2016   HDL 44.40 09/22/2014   Lab Results  Component Value Date   LDLCALC 103 (H) 11/03/2013   LDLCALC 160 (H) 09/16/2013   LDLCALC 86 05/09/2006   Lab Results  Component Value Date   TRIG 219.0 (H) 08/27/2017   TRIG 226.0 (H) 01/20/2016   TRIG 267.0 (H) 09/22/2014   Lab Results  Component Value Date   CHOLHDL 3 08/27/2017   CHOLHDL 4 01/20/2016   CHOLHDL 4 09/22/2014   Lab Results  Component Value Date   LDLDIRECT 58.0 08/27/2017   LDLDIRECT 77.0 01/20/2016   LDLDIRECT 50.0 09/22/2014  simvastatin and diet  Improved  Trig also improved   Patient Active Problem List   Diagnosis Date Noted  . Welcome to Medicare preventive visit 08/22/2016  . Right shoulder pain 01/24/2016  . Obesity 10/05/2014  . Palpitations 03/03/2013  . Colon cancer screening 03/03/2013  . Hypokalemia 10/09/2011  . Routine general medical examination at a health care facility 10/02/2011  . Vitamin D deficiency disease 12/28/2010  . ARTHRALGIA  08/19/2007  . Hypothyroidism 01/31/2007  . Hyperlipidemia 01/31/2007  . ANXIETY 01/31/2007  . Essential hypertension 01/31/2007  . GERD 01/31/2007   Past Medical History:  Diagnosis Date  . Anxiety   . Cataract   . GERD (gastroesophageal reflux disease)   . Hyperlipidemia   . Hypertension   . Hypothyroidism   . Vitamin D deficiency    Past Surgical History:  Procedure Laterality Date  . CATARACT EXTRACTION W/ INTRAOCULAR LENS IMPLANT Right 12/29/2016   Dr. Mia Creek  . CATARACT EXTRACTION W/ INTRAOCULAR LENS IMPLANT Left 01/15/2017   Dr. Mia Creek  . COLONOSCOPY    . LEEP    . TUBAL LIGATION    . VEIN LIGATION AND STRIPPING     x2   Social History   Tobacco Use  . Smoking status: Former Smoker    Last attempt to quit: 04/24/1996    Years since quitting: 21.3  . Smokeless tobacco: Never Used  Substance Use Topics  . Alcohol use: Yes    Alcohol/week: 0.0 oz     Comment: occ. (maybe every 3 mos.)  . Drug use: No   Family History  Problem Relation Age of Onset  . Transient ischemic attack Mother   . Hypertension Mother   . Hypertension Father   . Colon cancer Neg Hx   . Esophageal cancer Neg Hx   . Rectal cancer Neg Hx   . Stomach cancer Neg Hx    Allergies  Allergen Reactions  . Amlodipine Besylate     REACTION: edema  . Atorvastatin     REACTION: muscle pain  . Neomycin-Bacitracin Zn-Polymyx     REACTION: rash Per pt not sure if from meds or not   Current Outpatient Medications on File Prior to Visit  Medication Sig Dispense Refill  . aspirin 81 MG tablet Take 81 mg by mouth daily.    . calcium carbonate (TUMS EX) 750 MG chewable tablet Chew 2 tablets by mouth at bedtime.    . Calcium Citrate-Vitamin D 1000-400 LIQD Take 2 tablets by mouth daily.     . Cholecalciferol (VITAMIN D) 1000 UNITS capsule Take 2,000 Units by mouth daily.     . cyclobenzaprine (FLEXERIL) 10 MG tablet Take 0.5-1 tablets (5-10 mg total) by mouth 3 (three) times daily as needed for muscle spasms. 30 tablet 0  . estradiol (ESTRACE) 0.1 MG/GM vaginal cream Use 2 times a week as directed (about 2 cm in applicator).     . fluticasone (FLONASE) 50 MCG/ACT nasal spray USE 2 SPRAYS INTO EACH NOSTRIL DAILY. 16 g 11  . RESTASIS 0.05 % ophthalmic emulsion Place 1 drop into both eyes Twice daily.     No current facility-administered medications on file prior to visit.     Review of Systems  Constitutional: Negative for activity change, appetite change, fatigue, fever and unexpected weight change.  HENT: Negative for congestion, ear pain, rhinorrhea, sinus pressure and sore throat.   Eyes: Negative for pain, redness and visual disturbance.  Respiratory: Negative for cough, shortness of breath and wheezing.   Cardiovascular: Negative for chest pain and palpitations.  Gastrointestinal: Negative for abdominal pain, blood in stool, constipation and diarrhea.  Endocrine:  Negative for polydipsia and polyuria.  Genitourinary: Negative for dysuria, frequency and urgency.  Musculoskeletal: Negative for arthralgias, back pain and myalgias.  Skin: Negative for pallor and rash.  Allergic/Immunologic: Negative for environmental allergies.  Neurological: Negative for dizziness, syncope and headaches.  Hematological: Negative for adenopathy. Does not bruise/bleed  easily.  Psychiatric/Behavioral: Negative for decreased concentration and dysphoric mood. The patient is not nervous/anxious.        Objective:   Physical Exam  Constitutional: She appears well-developed and well-nourished. No distress.  obese and well appearing   HENT:  Head: Normocephalic and atraumatic.  Right Ear: External ear normal.  Left Ear: External ear normal.  Nose: Nose normal.  Mouth/Throat: Oropharynx is clear and moist.  Eyes: Pupils are equal, round, and reactive to light. Conjunctivae and EOM are normal. Right eye exhibits no discharge. Left eye exhibits no discharge. No scleral icterus.  Neck: Normal range of motion. Neck supple. No JVD present. Carotid bruit is not present. No thyromegaly present.  Cardiovascular: Normal rate, regular rhythm, normal heart sounds and intact distal pulses. Exam reveals no gallop.  Pulmonary/Chest: Effort normal and breath sounds normal. No respiratory distress. She has no wheezes. She has no rales.  Abdominal: Soft. Bowel sounds are normal. She exhibits no distension and no mass. There is no tenderness.  Musculoskeletal: She exhibits no edema or tenderness.  No kyphosis  No acute joint changes   Lymphadenopathy:    She has no cervical adenopathy.  Neurological: She is alert. She has normal reflexes. No cranial nerve deficit. She exhibits normal muscle tone. Coordination normal.  Skin: Skin is warm and dry. No rash noted. No erythema. No pallor.  Psychiatric: She has a normal mood and affect.  Pleasant  Mood is good           Assessment &  Plan:   Problem List Items Addressed This Visit      Cardiovascular and Mediastinum   Essential hypertension    bp in fair control at this time  BP Readings from Last 1 Encounters:  09/03/17 110/62   No changes needed Most recent labs reviewed  Disc lifstyle change with low sodium diet and exercise        Relevant Medications   hydrochlorothiazide (HYDRODIURIL) 50 MG tablet   losartan (COZAAR) 100 MG tablet   simvastatin (ZOCOR) 80 MG tablet     Endocrine   Hypothyroidism    Hypothyroidism  Pt has no clinical changes No change in energy level/ hair or skin/ edema and no tremor Lab Results  Component Value Date   TSH 2.44 08/27/2017          Relevant Medications   levothyroxine (SYNTHROID, LEVOTHROID) 50 MCG tablet     Other   Colon cancer screening    utd Colonoscopy in 2015 with 10 y recall       Hyperlipidemia    Disc goals for lipids and reasons to control them Rev last labs with pt Rev low sat fat diet in detail Improved LDL and trig  Commended       Relevant Medications   hydrochlorothiazide (HYDRODIURIL) 50 MG tablet   losartan (COZAAR) 100 MG tablet   simvastatin (ZOCOR) 80 MG tablet   Obesity    Discussed how this problem influences overall health and the risks it imposes  Reviewed plan for weight loss with lower calorie diet (via better food choices and also portion control or program like weight watchers) and exercise building up to or more than 30 minutes 5 days per week including some aerobic activity   Disc logging in or calorie counting for accountability  Also suggested weight watchers       Routine general medical examination at a health care facility - Primary    Reviewed health habits including diet and exercise  and skin cancer prevention Reviewed appropriate screening tests for age  Also reviewed health mt list, fam hx and immunization status , as well as social and family history   See HPI  amw reviewed  Will do hep C screen next  year and ppsv23 given  dexa ordered  Wt loss strategies disc /lifestyle change  Enc to inc vit D to 2000 iu daily       Vitamin D deficiency disease    Level is low  Will inc dose to 2000 iu or more a day (was not taking it)  Disc imp to bone and overall health

## 2017-09-04 ENCOUNTER — Encounter: Payer: Self-pay | Admitting: Family Medicine

## 2017-12-18 ENCOUNTER — Other Ambulatory Visit: Payer: Self-pay | Admitting: *Deleted

## 2017-12-18 MED ORDER — FLUTICASONE PROPIONATE 50 MCG/ACT NA SUSP: NASAL | 11 refills | Status: DC

## 2018-01-28 ENCOUNTER — Encounter: Payer: Self-pay | Admitting: Family Medicine

## 2018-01-28 ENCOUNTER — Ambulatory Visit (INDEPENDENT_AMBULATORY_CARE_PROVIDER_SITE_OTHER): Payer: Medicare Other | Admitting: Family Medicine

## 2018-01-28 VITALS — BP 124/76 | HR 79 | Temp 98.5°F | Ht 65.5 in | Wt 207.2 lb

## 2018-01-28 DIAGNOSIS — R05 Cough: Secondary | ICD-10-CM

## 2018-01-28 DIAGNOSIS — Z23 Encounter for immunization: Secondary | ICD-10-CM | POA: Diagnosis not present

## 2018-01-28 DIAGNOSIS — R059 Cough, unspecified: Secondary | ICD-10-CM | POA: Insufficient documentation

## 2018-01-28 DIAGNOSIS — K219 Gastro-esophageal reflux disease without esophagitis: Secondary | ICD-10-CM | POA: Diagnosis not present

## 2018-01-28 MED ORDER — AMOXICILLIN-POT CLAVULANATE 875-125 MG PO TABS: 1.0000 | ORAL_TABLET | Freq: Two times a day (BID) | ORAL | 0 refills | Status: AC

## 2018-01-28 MED ORDER — ALBUTEROL SULFATE HFA 108 (90 BASE) MCG/ACT IN AERS
2.0000 | INHALATION_SPRAY | Freq: Four times a day (QID) | RESPIRATORY_TRACT | 0 refills | Status: DC | PRN
Start: 2018-01-28 — End: 2018-02-20

## 2018-01-28 NOTE — Patient Instructions (Addendum)
Take protonix in the morning, add pepcid at night time.  We will refer you to do swallow study at [facility] and will be in touch with results.  This will be to evaluate for structural abnormality.  May try albuterol inhaler 2 puffs as needed for cough or wheezing.

## 2018-01-28 NOTE — Assessment & Plan Note (Signed)
Worsening, especially after any solids she eats.  Discussed treatment options ie PPI BID - she prefers to add pepcid to her once daily PPI and will reassess after several weeks of this treatment.  Will check barium swallow to eval for structural cause of worsening symptoms, consider GI eval if unrevealing.  Pt agrees with plan.

## 2018-01-28 NOTE — Assessment & Plan Note (Addendum)
Worse after recent GERD flare - anticipate GERD related. Could be irritant pneumonitis after aspiration last week.  Given endorsed wheezing, will add albuterol inhaler to use PRN (endorses h/o reactive airways).  She is not on ACEI.  Cough is dry, nagging. Given cough started after aspiration, will cover infection with augmentin antibiotic.

## 2018-01-28 NOTE — Addendum Note (Signed)
Addended by: Nanci Pina on: 01/28/2018 02:41 PM   Modules accepted: Orders

## 2018-01-28 NOTE — Progress Notes (Signed)
BP 124/76 (BP Location: Left Arm, Patient Position: Sitting, Cuff Size: Normal)   Pulse 79   Temp 98.5 F (36.9 C) (Oral)   Ht 5' 5.5" (1.664 m)   Wt 207 lb 4 oz (94 kg)   SpO2 97%   BMI 33.96 kg/m    CC: cough Subjective:    Patient ID: Sharon [name], female    DOB: [birth year], 67 y.o.   MRN: [id number]  HPI: Sharon [name] is a 67 y.o. female presenting on 01/28/2018 for Cough (C/o cough and fatigue. States last week she felt acid come up into her throat causing a cough and a burning sensation. Still has the cough. )   Last Tuesday night after eating dinner out, had bad case of reflux leading to burning in throat, coughing. This has been associated with malaise for the past week. Persistent cough ever since. Notes tickle in back of throat with eating. Does well with fluids. Some post tussive gagging. Mild hoarseness started last week as well. Some wheezing. Initial burning chest pain, now resolved.   Denies fevers/chills, dysphagia, nausea/vomiting, bowel changes, unexpected weight loss. No dyspnea.   EGD 2004 - told normal.  Takes protonix 40mg  daily. Using 2-6 tums a night when stomach bothers her.  Avoids NSAIDs.  Non smoker Occasional alcohol.   Relevant past medical, surgical, family and social history reviewed and updated as indicated. Interim medical history since our last visit reviewed. Allergies and medications reviewed and updated. Outpatient Medications Prior to Visit  Medication Sig Dispense Refill  . aspirin 81 MG tablet Take 81 mg by mouth daily.    Marland Kitchen buPROPion (WELLBUTRIN XL) 150 MG 24 hr tablet Take 1 tablet (150 mg total) by mouth daily. 90 tablet 3  . Calcium Citrate-Vitamin D 1000-400 LIQD Take 2 tablets by mouth daily.     . Cholecalciferol (VITAMIN D) 1000 UNITS capsule Take 2,000 Units by mouth daily.     Marland Kitchen estradiol (ESTRACE) 0.1 MG/GM vaginal cream Use 2 times a week as directed (about 2 cm in applicator).     . fluticasone (FLONASE) 50 MCG/ACT  nasal spray USE 2 SPRAYS INTO EACH NOSTRIL DAILY. 16 g 11  . hydrochlorothiazide (HYDRODIURIL) 50 MG tablet Take 1 tablet (50 mg total) by mouth daily. 90 tablet 3  . levothyroxine (SYNTHROID, LEVOTHROID) 50 MCG tablet TAKE 1 TABLET BY MOUTH  DAILY BEFORE BREAKFAST 90 tablet 3  . losartan (COZAAR) 100 MG tablet Take 1 tablet (100 mg total) by mouth daily. 90 tablet 3  . pantoprazole (PROTONIX) 40 MG tablet Take 1 tablet (40 mg total) by mouth daily. 90 tablet 3  . potassium chloride (K-DUR,KLOR-CON) 10 MEQ tablet Take 1 tablet (10 mEq total) by mouth daily. 90 tablet 3  . sertraline (ZOLOFT) 100 MG tablet Take 1 tablet (100 mg total) by mouth daily. 90 tablet 3  . simvastatin (ZOCOR) 80 MG tablet Take 1 tablet (80 mg total) by mouth daily. 90 tablet 3  . calcium carbonate (TUMS EX) 750 MG chewable tablet Chew 2 tablets by mouth at bedtime.    . cyclobenzaprine (FLEXERIL) 10 MG tablet Take 0.5-1 tablets (5-10 mg total) by mouth 3 (three) times daily as needed for muscle spasms. 30 tablet 0  . RESTASIS 0.05 % ophthalmic emulsion Place 1 drop into both eyes Twice daily.     No facility-administered medications prior to visit.      Per HPI unless specifically indicated in ROS section below Review of Systems  Objective:    BP 124/76 (BP Location: Left Arm, Patient Position: Sitting, Cuff Size: Normal)   Pulse 79   Temp 98.5 F (36.9 C) (Oral)   Ht 5' 5.5" (1.664 m)   Wt 207 lb 4 oz (94 kg)   SpO2 97%   BMI 33.96 kg/m   Wt Readings from Last 3 Encounters:  01/28/18 207 lb 4 oz (94 kg)  09/03/17 207 lb 8 oz (94.1 kg)  08/27/17 207 lb 4 oz (94 kg)    Physical Exam  Constitutional: She appears well-developed and well-nourished. No distress.  HENT:  Mouth/Throat: Oropharynx is clear and moist. No oropharyngeal exudate.  Cardiovascular: Normal rate, regular rhythm and normal heart sounds.  No murmur heard. Pulmonary/Chest: Effort normal and breath sounds normal. No respiratory  distress. She has no wheezes. She has no rales.  Abdominal: Soft. Bowel sounds are normal. She exhibits no distension and no mass. There is no hepatosplenomegaly. There is no tenderness. There is no rebound, no guarding and no CVA tenderness. No hernia.  Musculoskeletal: She exhibits no edema.  Psychiatric: She has a normal mood and affect.  Nursing note and vitals reviewed.      Assessment & Plan:   Problem List Items Addressed This Visit    GERD    Worsening, especially after any solids she eats.  Discussed treatment options ie PPI BID - she prefers to add pepcid to her once daily PPI and will reassess after several weeks of this treatment.  Will check barium swallow to eval for structural cause of worsening symptoms, consider GI eval if unrevealing.  Pt agrees with plan.       Relevant Orders   DG Esophagus   Cough - Primary    Worse after recent GERD flare - anticipate GERD related. Could be irritant pneumonitis after aspiration last week.  Given endorsed wheezing, will add albuterol inhaler to use PRN (endorses h/o reactive airways).  She is not on ACEI.  Cough is dry, nagging. Given cough started after aspiration, will cover infection with augmentin antibiotic.           Meds ordered this encounter  Medications  . albuterol (PROVENTIL HFA;VENTOLIN HFA) 108 (90 Base) MCG/ACT inhaler    Sig: Inhale 2 puffs into the lungs every 6 (six) hours as needed for wheezing or shortness of breath.    Dispense:  1 Inhaler    Refill:  0  . amoxicillin-clavulanate (AUGMENTIN) 875-125 MG tablet    Sig: Take 1 tablet by mouth 2 (two) times daily for 7 days.    Dispense:  14 tablet    Refill:  0   Orders Placed This Encounter  Procedures  . DG Esophagus    Standing Status:   Future    Standing Expiration Date:   03/31/2019    Order Specific Question:   Reason for Exam (SYMPTOM  OR DIAGNOSIS REQUIRED)    Answer:   worsening GERD daily with cough    Order Specific Question:    Preferred imaging location?    Answer:   [facility]    Order Specific Question:   Radiology Contrast Protocol - do NOT remove file path    Answer:   [email or web address]    Follow up plan: Return if symptoms worsen or fail to improve.  Eustaquio Boyden, MD

## 2018-01-31 ENCOUNTER — Encounter: Payer: Self-pay | Admitting: Family Medicine

## 2018-02-01 ENCOUNTER — Ambulatory Visit
Admission: RE | Admit: 2018-02-01 | Discharge: 2018-02-01 | Disposition: A | Discharge status: Home / Self Care | Payer: Medicare Other | Source: Ambulatory Visit | Attending: Family Medicine | Admitting: Family Medicine

## 2018-02-01 DIAGNOSIS — K449 Diaphragmatic hernia without obstruction or gangrene: Secondary | ICD-10-CM | POA: Insufficient documentation

## 2018-02-01 DIAGNOSIS — K219 Gastro-esophageal reflux disease without esophagitis: Secondary | ICD-10-CM | POA: Diagnosis not present

## 2018-02-20 ENCOUNTER — Other Ambulatory Visit: Payer: Self-pay | Admitting: Family Medicine

## 2018-03-25 DIAGNOSIS — E349 Endocrine disorder, unspecified: Secondary | ICD-10-CM | POA: Diagnosis not present

## 2018-03-25 DIAGNOSIS — Z1231 Encounter for screening mammogram for malignant neoplasm of breast: Secondary | ICD-10-CM | POA: Diagnosis not present

## 2018-03-25 DIAGNOSIS — Z780 Asymptomatic menopausal state: Secondary | ICD-10-CM | POA: Diagnosis not present

## 2018-03-26 ENCOUNTER — Encounter: Payer: Self-pay | Admitting: Family Medicine

## 2018-04-04 ENCOUNTER — Encounter: Payer: Self-pay | Admitting: *Deleted

## 2018-05-09 DIAGNOSIS — H52223 Regular astigmatism, bilateral: Secondary | ICD-10-CM | POA: Diagnosis not present

## 2018-05-09 DIAGNOSIS — H0288A Meibomian gland dysfunction right eye, upper and lower eyelids: Secondary | ICD-10-CM | POA: Diagnosis not present

## 2018-05-09 DIAGNOSIS — Z9842 Cataract extraction status, left eye: Secondary | ICD-10-CM | POA: Diagnosis not present

## 2018-05-09 DIAGNOSIS — H04123 Dry eye syndrome of bilateral lacrimal glands: Secondary | ICD-10-CM | POA: Diagnosis not present

## 2018-05-09 DIAGNOSIS — Z9841 Cataract extraction status, right eye: Secondary | ICD-10-CM | POA: Diagnosis not present

## 2018-08-09 ENCOUNTER — Other Ambulatory Visit: Payer: Self-pay | Admitting: Family Medicine

## 2018-08-15 ENCOUNTER — Other Ambulatory Visit: Payer: Self-pay | Admitting: Family Medicine

## 2018-08-16 NOTE — Telephone Encounter (Signed)
Pharmacy requests refill on losartan 100mg    Last refilled: 09/03/17, #90, 3 refills  Last seen: 09/04/18 for Routine general care  Next appt: cancelled AWV on 09/11/18  Patient did receive refills X 1 year on all of her medications except this one on 08/09/18; however, patient should be scheduled a follow up before filling.  I have LM on both home and cell vm requesting patient to call us back to discuss virtual visit capabilities as patient needs to schedule f/u before medication can be filled.   Thanks.

## 2018-08-20 NOTE — Telephone Encounter (Signed)
I left a message on patient's voice mail to call back and schedule appointment.  Patient needs AWV with Virl Axe and cpx with Dr.Tower.

## 2018-08-20 NOTE — Telephone Encounter (Signed)
I sent in 90/0 since it was mail order. Will forward to [place] to set up yearly appt.

## 2018-08-28 ENCOUNTER — Ambulatory Visit (INDEPENDENT_AMBULATORY_CARE_PROVIDER_SITE_OTHER): Payer: Medicare Other | Admitting: Family Medicine

## 2018-08-28 ENCOUNTER — Encounter: Payer: Self-pay | Admitting: Family Medicine

## 2018-08-28 DIAGNOSIS — Z6832 Body mass index (BMI) 32.0-32.9, adult: Secondary | ICD-10-CM

## 2018-08-28 DIAGNOSIS — E782 Mixed hyperlipidemia: Secondary | ICD-10-CM | POA: Diagnosis not present

## 2018-08-28 DIAGNOSIS — K219 Gastro-esophageal reflux disease without esophagitis: Secondary | ICD-10-CM

## 2018-08-28 DIAGNOSIS — I10 Essential (primary) hypertension: Secondary | ICD-10-CM

## 2018-08-28 DIAGNOSIS — Z0000 Encounter for general adult medical examination without abnormal findings: Secondary | ICD-10-CM | POA: Insufficient documentation

## 2018-08-28 DIAGNOSIS — E039 Hypothyroidism, unspecified: Secondary | ICD-10-CM | POA: Diagnosis not present

## 2018-08-28 DIAGNOSIS — E6609 Other obesity due to excess calories: Secondary | ICD-10-CM

## 2018-08-28 DIAGNOSIS — E559 Vitamin D deficiency, unspecified: Secondary | ICD-10-CM

## 2018-08-28 MED ORDER — POTASSIUM CHLORIDE CRYS ER 10 MEQ PO TBCR: 10.0000 meq | EXTENDED_RELEASE_TABLET | Freq: Every day | ORAL | 3 refills | Status: DC

## 2018-08-28 MED ORDER — PANTOPRAZOLE SODIUM 40 MG PO TBEC: 40.0000 mg | DELAYED_RELEASE_TABLET | Freq: Every day | ORAL | 3 refills | Status: DC

## 2018-08-28 MED ORDER — FLUTICASONE PROPIONATE 50 MCG/ACT NA SUSP: NASAL | 3 refills | Status: DC

## 2018-08-28 MED ORDER — SERTRALINE HCL 100 MG PO TABS: 100.0000 mg | ORAL_TABLET | Freq: Every day | ORAL | 3 refills | Status: DC

## 2018-08-28 MED ORDER — HYDROCHLOROTHIAZIDE 50 MG PO TABS: 50.0000 mg | ORAL_TABLET | Freq: Every day | ORAL | 3 refills | Status: DC

## 2018-08-28 MED ORDER — SIMVASTATIN 80 MG PO TABS: 80.0000 mg | ORAL_TABLET | Freq: Every day | ORAL | 3 refills | Status: DC

## 2018-08-28 MED ORDER — LEVOTHYROXINE SODIUM 50 MCG PO TABS: 50.0000 ug | ORAL_TABLET | Freq: Every day | ORAL | 3 refills | Status: DC

## 2018-08-28 MED ORDER — LOSARTAN POTASSIUM 100 MG PO TABS: 100.0000 mg | ORAL_TABLET | Freq: Every day | ORAL | 3 refills | Status: DC

## 2018-08-28 NOTE — Assessment & Plan Note (Signed)
Due for TSH No clinical changes at all  Will set up lab draw

## 2018-08-28 NOTE — Assessment & Plan Note (Signed)
bp has been well controlled with hctz and losartan She will check it at home and report back Good health habits, enc wt loss Labs ordered

## 2018-08-28 NOTE — Assessment & Plan Note (Signed)
Due for lipid profile Disc goals for lipids and reasons to control them Rev last labs with pt Rev low sat fat diet in detail On full dose simvastatin with no side effects or problems Will schedule fasting labs

## 2018-08-28 NOTE — Patient Instructions (Signed)
Take care of yourself Try to get more exercise when you are able/ and get outdoors Eat a healthy diet low in trans fat and processed carbs Check your blood pressure and let us know how it is  We will call you to schedule a drive through fasting lab visit

## 2018-08-28 NOTE — Assessment & Plan Note (Signed)
Good control most of the time with protonix - depending on what she eats Disc avoidance of offending foods/ keeping a journal  In general spicy things and tomato sauce may bother  Uses tums prn

## 2018-08-28 NOTE — Assessment & Plan Note (Signed)
Due for level Takes 2000 iu daily  Disc imp for bone and overall health Will plan lab

## 2018-08-28 NOTE — Progress Notes (Signed)
Virtual Visit via Video Note  I connected with Talbot GrumblingBarbara B Kolakowski on 08/28/18 at  3:15 PM EDT by a video enabled telemedicine application and verified that I am speaking with the correct person using two identifiers.  Location: Patient: home Provider: office    I discussed the limitations of evaluation and management by telemedicine and the availability of in person appointments. The patient expressed understanding and agreed to proceed.  History of Present Illness: Presents for f/u of chronic medical problems Doing well  Feeling well  Getting outside a lot- gardening   Weight at home - few lb up  Some exercise outside   bp is stable today -can check at home  No cp or palpitations or headaches or edema  No side effects to medicines  BP Readings from Last 3 Encounters:  01/28/18 124/76  09/03/17 110/62  08/27/17 118/84    hctz Losartan  Hypothyroidism  Pt has no clinical changes No change in energy level/ hair or skin/ edema and no tremor Lab Results  Component Value Date   TSH 2.44 08/27/2017    Due for labs No missed doses   GERD- well controlled most of the time  occ bad day depending on what she eats  protonix-works well most of the time occ tums   Mood is good  No problems with motivation and interest No depression or feelings of hopelessness   Hyperlipidemia Lab Results  Component Value Date   CHOL 190 08/27/2017   HDL 55.50 08/27/2017   LDLCALC 103 (H) 11/03/2013   LDLDIRECT 58.0 08/27/2017   TRIG 219.0 (H) 08/27/2017   CHOLHDL 3 08/27/2017  no change in diet  No fatty food or red meat No fast food    H/o vit d def - taking every day 2000 iu  29.3 a year ago Will re check   dexa 12/19 -normal BMD   Zoster vaccine 2014   Advance directive-pt has  Cognitive function addressed- see scanned forms- and if abnormal then additional documentation follows.  No concerns per pt  She reads and does socialize quite a bit   Bad balance as she gets  older A few falls  Once she got up to go to the bathroom  Vision - had eye doctor appt 12/19 -all was good Hearing - no issues/doing well /all the same   PMH and SH reviewed  Meds, vitals, and allergies reviewed.   Review of Systems  Constitutional: Negative for chills, fever, malaise/fatigue and weight loss.  HENT: Negative for ear pain, hearing loss and sore throat.   Eyes: Negative for blurred vision.  Respiratory: Negative for cough and shortness of breath.   Cardiovascular: Negative for chest pain, palpitations and leg swelling.  Gastrointestinal: Positive for heartburn. Negative for abdominal pain, diarrhea, nausea and vomiting.       Occ heartburn  Genitourinary: Negative for dysuria and frequency.  Musculoskeletal: Negative for myalgias.  Skin: Negative for itching and rash.  Neurological: Negative for dizziness and headaches.  Psychiatric/Behavioral: Negative for depression and memory loss. The patient is not nervous/anxious.      Patient Active Problem List   Diagnosis Date Noted  . Medicare annual wellness visit, subsequent 08/28/2018  . Cough 01/28/2018  . Welcome to Medicare preventive visit 08/22/2016  . Obesity 10/05/2014  . Colon cancer screening 03/03/2013  . Hypokalemia 10/09/2011  . Routine general medical examination at a health care facility 10/02/2011  . Vitamin D deficiency disease 12/28/2010  . ARTHRALGIA 08/19/2007  . Hypothyroidism  01/31/2007  . Hyperlipidemia 01/31/2007  . ANXIETY 01/31/2007  . Essential hypertension 01/31/2007  . GERD 01/31/2007   Past Medical History:  Diagnosis Date  . Anxiety   . Cataract   . GERD (gastroesophageal reflux disease)   . Hyperlipidemia   . Hypertension   . Hypothyroidism   . Vitamin D deficiency    Past Surgical History:  Procedure Laterality Date  . CATARACT EXTRACTION W/ INTRAOCULAR LENS IMPLANT Right 12/29/2016   Dr. Mia Creek  . CATARACT EXTRACTION W/ INTRAOCULAR LENS IMPLANT Left 01/15/2017    Dr. Mia Creek  . COLONOSCOPY    . LEEP    . TUBAL LIGATION    . VEIN LIGATION AND STRIPPING     x2   Social History   Tobacco Use  . Smoking status: Former Smoker    Last attempt to quit: 04/24/1996    Years since quitting: 22.3  . Smokeless tobacco: Never Used  Substance Use Topics  . Alcohol use: Yes    Alcohol/week: 0.0 standard drinks    Comment: occ. (maybe every 3 mos.)  . Drug use: No   Family History  Problem Relation Age of Onset  . Transient ischemic attack Mother   . Hypertension Mother   . Hypertension Father   . Colon cancer Neg Hx   . Esophageal cancer Neg Hx   . Rectal cancer Neg Hx   . Stomach cancer Neg Hx    Allergies  Allergen Reactions  . Amlodipine Besylate     REACTION: edema  . Atorvastatin     REACTION: muscle pain  . Neomycin-Bacitracin Zn-Polymyx     REACTION: rash Per pt not sure if from meds or not   Current Outpatient Medications on File Prior to Visit  Medication Sig Dispense Refill  . aspirin 81 MG tablet Take 81 mg by mouth daily.    Marland Kitchen buPROPion (WELLBUTRIN XL) 150 MG 24 hr tablet TAKE 1 TABLET BY MOUTH  DAILY 90 tablet 3  . calcium carbonate (TUMS EX) 750 MG chewable tablet Chew 2 tablets by mouth at bedtime.    . Calcium Citrate-Vitamin D 1000-400 LIQD Take 2 tablets by mouth daily.     . Cholecalciferol (VITAMIN D) 1000 UNITS capsule Take 2,000 Units by mouth daily.     Marland Kitchen estradiol (ESTRACE) 0.1 MG/GM vaginal cream Use 2 times a week as directed (about 2 cm in applicator).     Marland Kitchen PROAIR HFA 108 (90 Base) MCG/ACT inhaler TAKE 2 PUFFS BY MOUTH EVERY 6 HOURS AS NEEDED FOR WHEEZE OR SHORTNESS OF BREATH 8.5 Inhaler 2   No current facility-administered medications on file prior to visit.     Observations/Objective: Pt appears well  Baseline overweight  No facial swelling or asymmetry  No rash/pallor or skin change Not hoarse/ normal voice Not sob and no cough  Nl affect- pleasant and talkative Mentally sharp  Assessment  and Plan: Problem List Items Addressed This Visit      Cardiovascular and Mediastinum   Essential hypertension - Primary    bp has been well controlled with hctz and losartan She will check it at home and report back Good health habits, enc wt loss Labs ordered       Relevant Medications   hydrochlorothiazide (HYDRODIURIL) 50 MG tablet   losartan (COZAAR) 100 MG tablet   simvastatin (ZOCOR) 80 MG tablet   Other Relevant Orders   CBC with Differential/Platelet   Comprehensive metabolic panel   Lipid panel   TSH  Digestive   GERD    Good control most of the time with protonix - depending on what she eats Disc avoidance of offending foods/ keeping a journal  In general spicy things and tomato sauce may bother  Uses tums prn      Relevant Medications   pantoprazole (PROTONIX) 40 MG tablet     Endocrine   Hypothyroidism    Due for TSH No clinical changes at all  Will set up lab draw      Relevant Medications   levothyroxine (SYNTHROID) 50 MCG tablet   Other Relevant Orders   TSH     Other   Hyperlipidemia    Due for lipid profile Disc goals for lipids and reasons to control them Rev last labs with pt Rev low sat fat diet in detail On full dose simvastatin with no side effects or problems Will schedule fasting labs       Relevant Medications   hydrochlorothiazide (HYDRODIURIL) 50 MG tablet   losartan (COZAAR) 100 MG tablet   simvastatin (ZOCOR) 80 MG tablet   Other Relevant Orders   Lipid panel   Vitamin D deficiency disease    Due for level Takes 2000 iu daily  Disc imp for bone and overall health Will plan lab      Relevant Orders   VITAMIN D 25 Hydroxy (Vit-D Deficiency, Fractures)   Obesity    Discussed how this problem influences overall health and the risks it imposes  Reviewed plan for weight loss with lower calorie diet (via better food choices and also portion control or program like weight watchers) and exercise building up to or more than  30 minutes 5 days per week including some aerobic activity   Reminded pt to make effort to exercise while at home            Follow Up Instructions: Take care of yourself Try to get more exercise when you are able/ and get outdoors Eat a healthy diet low in trans fat and processed carbs Check your blood pressure and let us know how it is  We will call you to schedule a drive through fasting lab visit     I discussed the assessment and treatment plan with the patient. The patient was provided an opportunity to ask questions and all were answered. The patient agreed with the plan and demonstrated an understanding of the instructions.   The patient was advised to call back or seek an in-person evaluation if the symptoms worsen or if the condition fails to improve as anticipated.     Roxy Manns, MD

## 2018-08-28 NOTE — Assessment & Plan Note (Signed)
Discussed how this problem influences overall health and the risks it imposes  Reviewed plan for weight loss with lower calorie diet (via better food choices and also portion control or program like weight watchers) and exercise building up to or more than 30 minutes 5 days per week including some aerobic activity   Reminded pt to make effort to exercise while at home

## 2018-08-29 ENCOUNTER — Telehealth: Payer: Self-pay | Admitting: Family Medicine

## 2018-08-29 NOTE — Telephone Encounter (Signed)
I spoke to patient's husband and he'll ask patient to call back and schedule lab appointment.

## 2018-09-04 ENCOUNTER — Other Ambulatory Visit (INDEPENDENT_AMBULATORY_CARE_PROVIDER_SITE_OTHER): Payer: Medicare Other

## 2018-09-04 DIAGNOSIS — I10 Essential (primary) hypertension: Secondary | ICD-10-CM

## 2018-09-04 DIAGNOSIS — E039 Hypothyroidism, unspecified: Secondary | ICD-10-CM | POA: Diagnosis not present

## 2018-09-04 DIAGNOSIS — E559 Vitamin D deficiency, unspecified: Secondary | ICD-10-CM | POA: Diagnosis not present

## 2018-09-04 DIAGNOSIS — E782 Mixed hyperlipidemia: Secondary | ICD-10-CM

## 2018-09-04 LAB — LIPID PANEL
Cholesterol: 193 mg/dL (ref 0–200)
HDL: 56.4 mg/dL (ref >39.00)
NonHDL: 136.87
Total CHOL/HDL Ratio: 3
Triglycerides: 229 mg/dL — ABNORMAL HIGH (ref 0.0–149.0)
VLDL: 45.8 mg/dL — ABNORMAL HIGH (ref 0.0–40.0)

## 2018-09-04 LAB — COMPREHENSIVE METABOLIC PANEL
ALT: 12 U/L (ref 0–35)
AST: 18 U/L (ref 0–37)
Albumin: 4.3 g/dL (ref 3.5–5.2)
Alkaline Phosphatase: 74 U/L (ref 39–117)
BUN: 16 mg/dL (ref 6–23)
CO2: 29 mEq/L (ref 19–32)
Calcium: 9.5 mg/dL (ref 8.4–10.5)
Chloride: 100 mEq/L (ref 96–112)
Creatinine, Ser: 0.95 mg/dL (ref 0.40–1.20)
GFR: 58.56 mL/min — ABNORMAL LOW (ref >60.00)
Glucose, Bld: 100 mg/dL — ABNORMAL HIGH (ref 70–99)
Potassium: 3.7 mEq/L (ref 3.5–5.1)
Sodium: 139 mEq/L (ref 135–145)
Total Bilirubin: 0.4 mg/dL (ref 0.2–1.2)
Total Protein: 7.3 g/dL (ref 6.0–8.3)

## 2018-09-04 LAB — CBC WITH DIFFERENTIAL/PLATELET
Basophils Absolute: 0 10*3/uL (ref 0.0–0.1)
Basophils Relative: 0.5 % (ref 0.0–3.0)
Eosinophils Absolute: 0.3 10*3/uL (ref 0.0–0.7)
Eosinophils Relative: 5.2 % — ABNORMAL HIGH (ref 0.0–5.0)
HCT: 39.9 % (ref 36.0–46.0)
Hemoglobin: 13.3 g/dL (ref 12.0–15.0)
Lymphocytes Relative: 36 % (ref 12.0–46.0)
Lymphs Abs: 2.4 10*3/uL (ref 0.7–4.0)
MCHC: 33.3 g/dL (ref 30.0–36.0)
MCV: 96.9 fl (ref 78.0–100.0)
Monocytes Absolute: 0.5 10*3/uL (ref 0.1–1.0)
Monocytes Relative: 7.1 % (ref 3.0–12.0)
Neutro Abs: 3.3 10*3/uL (ref 1.4–7.7)
Neutrophils Relative %: 51.2 % (ref 43.0–77.0)
Platelets: 146 10*3/uL — ABNORMAL LOW (ref 150.0–400.0)
RBC: 4.11 Mil/uL (ref 3.87–5.11)
RDW: 14.6 % (ref 11.5–15.5)
WBC: 6.5 10*3/uL (ref 4.0–10.5)

## 2018-09-04 LAB — LDL CHOLESTEROL, DIRECT: Direct LDL: 46 mg/dL

## 2018-09-04 LAB — TSH: TSH: 2.91 u[IU]/mL (ref 0.35–4.50)

## 2018-09-04 LAB — VITAMIN D 25 HYDROXY (VIT D DEFICIENCY, FRACTURES): VITD: 28.76 ng/mL — ABNORMAL LOW (ref 30.00–100.00)

## 2018-09-11 ENCOUNTER — Ambulatory Visit: Payer: Medicare Other

## 2018-09-13 ENCOUNTER — Encounter: Payer: Medicare Other | Admitting: Family Medicine

## 2019-01-24 ENCOUNTER — Ambulatory Visit (INDEPENDENT_AMBULATORY_CARE_PROVIDER_SITE_OTHER): Payer: Medicare Other

## 2019-01-24 DIAGNOSIS — Z23 Encounter for immunization: Secondary | ICD-10-CM | POA: Diagnosis not present

## 2019-04-07 ENCOUNTER — Encounter: Payer: Self-pay | Admitting: Family Medicine

## 2019-04-07 DIAGNOSIS — Z1231 Encounter for screening mammogram for malignant neoplasm of breast: Secondary | ICD-10-CM | POA: Diagnosis not present

## 2019-05-12 DIAGNOSIS — H0288A Meibomian gland dysfunction right eye, upper and lower eyelids: Secondary | ICD-10-CM | POA: Diagnosis not present

## 2019-05-12 DIAGNOSIS — Z9841 Cataract extraction status, right eye: Secondary | ICD-10-CM | POA: Diagnosis not present

## 2019-05-12 DIAGNOSIS — H04123 Dry eye syndrome of bilateral lacrimal glands: Secondary | ICD-10-CM | POA: Diagnosis not present

## 2019-05-12 DIAGNOSIS — H0288B Meibomian gland dysfunction left eye, upper and lower eyelids: Secondary | ICD-10-CM | POA: Diagnosis not present

## 2019-05-12 DIAGNOSIS — H52213 Irregular astigmatism, bilateral: Secondary | ICD-10-CM | POA: Diagnosis not present

## 2019-05-12 DIAGNOSIS — H16223 Keratoconjunctivitis sicca, not specified as Sjogren's, bilateral: Secondary | ICD-10-CM | POA: Diagnosis not present

## 2019-06-08 ENCOUNTER — Ambulatory Visit: Payer: Medicare Other | Attending: Internal Medicine

## 2019-06-08 DIAGNOSIS — Z23 Encounter for immunization: Secondary | ICD-10-CM | POA: Insufficient documentation

## 2019-06-08 NOTE — Progress Notes (Signed)
   Covid-19 Vaccination Clinic  Name:  Sharon [name]    MRN: [id number] DOB: Dec 06, 1950  06/08/2019  Sharon [name] was observed post Covid-19 immunization for 15 minutes without incidence. She was provided with Vaccine Information Sheet and instruction to access the V-Safe system.   Sharon [name] was instructed to call 911 with any severe reactions post vaccine: Marland Kitchen Difficulty breathing  . Swelling of your face and throat  . A fast heartbeat  . A bad rash all over your body  . Dizziness and weakness    Immunizations Administered    Name Date Dose VIS Date Route   Pfizer COVID-19 Vaccine 06/08/2019 11:25 AM 0.3 mL 04/04/2019 Intramuscular   Manufacturer: [employer], [employer]   Lot: [id number]   NDC: [address]-3

## 2019-07-01 ENCOUNTER — Ambulatory Visit: Payer: Medicare Other | Attending: Internal Medicine

## 2019-07-01 DIAGNOSIS — Z23 Encounter for immunization: Secondary | ICD-10-CM | POA: Insufficient documentation

## 2019-07-01 NOTE — Progress Notes (Signed)
   Covid-19 Vaccination Clinic  Name:  Sharon [name]    MRN: [id number] DOB: 07-19-1950  07/01/2019  [name] was observed post Covid-19 immunization for 15 minutes without incident. She was provided with Vaccine Information Sheet and instruction to access the V-Safe system.   [name] was instructed to call 911 with any severe reactions post vaccine: Marland Kitchen Difficulty breathing  . Swelling of face and throat  . A fast heartbeat  . A bad rash all over body  . Dizziness and weakness   Immunizations Administered    Name Date Dose VIS Date Route   Pfizer COVID-19 Vaccine 07/01/2019  1:42 PM 0.3 mL 04/04/2019 Intramuscular   Manufacturer: [employer], [employer]   Lot: [id number]   NDC: [address]-7

## 2019-07-10 ENCOUNTER — Other Ambulatory Visit: Payer: Self-pay | Admitting: Family Medicine

## 2019-09-28 ENCOUNTER — Other Ambulatory Visit: Payer: Self-pay | Admitting: Family Medicine

## 2019-09-29 NOTE — Telephone Encounter (Signed)
Please schedule f/u or PE and refill until then 

## 2019-09-29 NOTE — Telephone Encounter (Signed)
Pt hasn't been seen in over a year and no future appts., please advise  

## 2019-09-30 NOTE — Telephone Encounter (Signed)
meds refilled once and Lyla Son will reach out to pt to try and schedule CPE or f/u

## 2019-10-01 NOTE — Telephone Encounter (Signed)
Patient was unavailable when I called.  She'll call back. When patient returns call, please schedule cpx.

## 2019-12-09 ENCOUNTER — Telehealth (INDEPENDENT_AMBULATORY_CARE_PROVIDER_SITE_OTHER): Payer: Medicare Other | Admitting: Family Medicine

## 2019-12-09 ENCOUNTER — Other Ambulatory Visit (INDEPENDENT_AMBULATORY_CARE_PROVIDER_SITE_OTHER): Payer: Medicare Other

## 2019-12-09 DIAGNOSIS — I10 Essential (primary) hypertension: Secondary | ICD-10-CM | POA: Diagnosis not present

## 2019-12-09 DIAGNOSIS — E039 Hypothyroidism, unspecified: Secondary | ICD-10-CM

## 2019-12-09 DIAGNOSIS — E782 Mixed hyperlipidemia: Secondary | ICD-10-CM

## 2019-12-09 DIAGNOSIS — E559 Vitamin D deficiency, unspecified: Secondary | ICD-10-CM

## 2019-12-09 LAB — COMPREHENSIVE METABOLIC PANEL
ALT: 8 U/L (ref 0–35)
AST: 15 U/L (ref 0–37)
Albumin: 4.3 g/dL (ref 3.5–5.2)
Alkaline Phosphatase: 68 U/L (ref 39–117)
BUN: 18 mg/dL (ref 6–23)
CO2: 31 mEq/L (ref 19–32)
Calcium: 10.1 mg/dL (ref 8.4–10.5)
Chloride: 100 mEq/L (ref 96–112)
Creatinine, Ser: 0.92 mg/dL (ref 0.40–1.20)
GFR: 60.54 mL/min (ref >60.00)
Glucose, Bld: 106 mg/dL — ABNORMAL HIGH (ref 70–99)
Potassium: 3.9 mEq/L (ref 3.5–5.1)
Sodium: 139 mEq/L (ref 135–145)
Total Bilirubin: 0.6 mg/dL (ref 0.2–1.2)
Total Protein: 7.1 g/dL (ref 6.0–8.3)

## 2019-12-09 LAB — CBC WITH DIFFERENTIAL/PLATELET
Basophils Absolute: 0 10*3/uL (ref 0.0–0.1)
Basophils Relative: 0.6 % (ref 0.0–3.0)
Eosinophils Absolute: 0.3 10*3/uL (ref 0.0–0.7)
Eosinophils Relative: 4.6 % (ref 0.0–5.0)
HCT: 37.9 % (ref 36.0–46.0)
Hemoglobin: 12.6 g/dL (ref 12.0–15.0)
Lymphocytes Relative: 34.2 % (ref 12.0–46.0)
Lymphs Abs: 2.4 10*3/uL (ref 0.7–4.0)
MCHC: 33.4 g/dL (ref 30.0–36.0)
MCV: 97.1 fl (ref 78.0–100.0)
Monocytes Absolute: 0.4 10*3/uL (ref 0.1–1.0)
Monocytes Relative: 6.2 % (ref 3.0–12.0)
Neutro Abs: 3.9 10*3/uL (ref 1.4–7.7)
Neutrophils Relative %: 54.4 % (ref 43.0–77.0)
Platelets: 261 10*3/uL (ref 150.0–400.0)
RBC: 3.9 Mil/uL (ref 3.87–5.11)
RDW: 14.1 % (ref 11.5–15.5)
WBC: 7.1 10*3/uL (ref 4.0–10.5)

## 2019-12-09 LAB — LIPID PANEL
Cholesterol: 229 mg/dL — ABNORMAL HIGH (ref 0–200)
HDL: 58.8 mg/dL (ref >39.00)
NonHDL: 169.82
Total CHOL/HDL Ratio: 4
Triglycerides: 291 mg/dL — ABNORMAL HIGH (ref 0.0–149.0)
VLDL: 58.2 mg/dL — ABNORMAL HIGH (ref 0.0–40.0)

## 2019-12-09 LAB — LDL CHOLESTEROL, DIRECT: Direct LDL: 58 mg/dL

## 2019-12-09 LAB — TSH: TSH: 4.32 u[IU]/mL (ref 0.35–4.50)

## 2019-12-09 LAB — VITAMIN D 25 HYDROXY (VIT D DEFICIENCY, FRACTURES): VITD: 37.87 ng/mL (ref 30.00–100.00)

## 2019-12-09 NOTE — Telephone Encounter (Signed)
Lab orders

## 2019-12-10 ENCOUNTER — Other Ambulatory Visit: Payer: Medicare Other

## 2019-12-15 ENCOUNTER — Other Ambulatory Visit: Payer: Self-pay | Admitting: Family Medicine

## 2019-12-17 ENCOUNTER — Other Ambulatory Visit: Payer: Self-pay

## 2019-12-17 ENCOUNTER — Ambulatory Visit (INDEPENDENT_AMBULATORY_CARE_PROVIDER_SITE_OTHER): Payer: Medicare Other | Admitting: Family Medicine

## 2019-12-17 ENCOUNTER — Encounter: Payer: Self-pay | Admitting: Family Medicine

## 2019-12-17 VITALS — BP 126/76 | HR 88 | Temp 97.0°F | Ht 65.5 in | Wt 212.0 lb

## 2019-12-17 DIAGNOSIS — R7303 Prediabetes: Secondary | ICD-10-CM | POA: Insufficient documentation

## 2019-12-17 DIAGNOSIS — E559 Vitamin D deficiency, unspecified: Secondary | ICD-10-CM

## 2019-12-17 DIAGNOSIS — I10 Essential (primary) hypertension: Secondary | ICD-10-CM | POA: Diagnosis not present

## 2019-12-17 DIAGNOSIS — E6609 Other obesity due to excess calories: Secondary | ICD-10-CM | POA: Diagnosis not present

## 2019-12-17 DIAGNOSIS — Z0000 Encounter for general adult medical examination without abnormal findings: Secondary | ICD-10-CM | POA: Diagnosis not present

## 2019-12-17 DIAGNOSIS — Z1159 Encounter for screening for other viral diseases: Secondary | ICD-10-CM | POA: Insufficient documentation

## 2019-12-17 DIAGNOSIS — E039 Hypothyroidism, unspecified: Secondary | ICD-10-CM

## 2019-12-17 DIAGNOSIS — Z6834 Body mass index (BMI) 34.0-34.9, adult: Secondary | ICD-10-CM

## 2019-12-17 DIAGNOSIS — R7301 Impaired fasting glucose: Secondary | ICD-10-CM | POA: Insufficient documentation

## 2019-12-17 DIAGNOSIS — E782 Mixed hyperlipidemia: Secondary | ICD-10-CM

## 2019-12-17 DIAGNOSIS — R739 Hyperglycemia, unspecified: Secondary | ICD-10-CM | POA: Insufficient documentation

## 2019-12-17 MED ORDER — HYDROCHLOROTHIAZIDE 50 MG PO TABS
50.0000 mg | ORAL_TABLET | Freq: Every day | ORAL | 3 refills | Status: DC
Start: 2019-12-17 — End: 2021-01-10

## 2019-12-17 MED ORDER — LEVOTHYROXINE SODIUM 50 MCG PO TABS
ORAL_TABLET | ORAL | 3 refills | Status: DC
Start: 2019-12-17 — End: 2021-01-10

## 2019-12-17 MED ORDER — BUPROPION HCL ER (XL) 150 MG PO TB24
150.0000 mg | ORAL_TABLET | Freq: Every day | ORAL | 3 refills | Status: DC
Start: 2019-12-17 — End: 2021-01-10

## 2019-12-17 MED ORDER — SIMVASTATIN 80 MG PO TABS
80.0000 mg | ORAL_TABLET | Freq: Every day | ORAL | 3 refills | Status: DC
Start: 2019-12-17 — End: 2021-01-10

## 2019-12-17 MED ORDER — PANTOPRAZOLE SODIUM 40 MG PO TBEC
40.0000 mg | DELAYED_RELEASE_TABLET | Freq: Every day | ORAL | 3 refills | Status: DC
Start: 2019-12-17 — End: 2021-01-10

## 2019-12-17 MED ORDER — SERTRALINE HCL 100 MG PO TABS
100.0000 mg | ORAL_TABLET | Freq: Every day | ORAL | 3 refills | Status: DC
Start: 2019-12-17 — End: 2021-01-10

## 2019-12-17 MED ORDER — POTASSIUM CHLORIDE CRYS ER 10 MEQ PO TBCR
10.0000 meq | EXTENDED_RELEASE_TABLET | Freq: Every day | ORAL | 3 refills | Status: DC
Start: 2019-12-17 — End: 2020-08-23

## 2019-12-17 NOTE — Progress Notes (Signed)
Subjective:    Patient ID: Sharon [name], female    DOB: Mar 27, 1951, 69 y.o.   MRN: [id number]  This visit occurred during the SARS-CoV-2 public health emergency.  Safety protocols were in place, including screening questions prior to the visit, additional usage of staff PPE, and extensive cleaning of exam room while observing appropriate contact time as indicated for disinfecting solutions.    HPI Pt presents for amw and health mt with rev of chronic medical problems   I have personally reviewed the Medicare Annual Wellness questionnaire and have noted 1. The patient's medical and social history 2. Their use of alcohol, tobacco or illicit drugs 3. Their current medications and supplements 4. The patient's functional ability including ADL's, fall risks, home safety risks and hearing or visual             impairment. 5. Diet and physical activities 6. Evidence for depression or mood disorders  The patients weight, height, BMI have been recorded in the chart and visual acuity is per eye clinic.  I have made referrals, counseling and provided education to the patient based review of the above and I have provided the pt with a written personalized care plan for preventive services. Reviewed and updated provider list, see scanned forms.  See scanned forms.  Routine anticipatory guidance given to patient.  See health maintenance. Colon cancer screening  Colonoscopy 1/15 Breast cancer screening mammogram 12/20  Self breast exam-no lumps or changes  Has not seen gyn in 3 years  Flu vaccine-plans to get in the fall covid immunized Td 1/00=def for financial/ins Tetanus vaccine  Pneumovax=completed  Zoster vaccine-zostavax 12/14    - may be interested in shingrix  Dexa 12/19 -normal bone density Falls- one  Fractures- none  Supplements-takes vit D  D level is 37.8 (takes 4000 iu daily)  Does not go out in the sun  Exercise -walks a very long and steep driveway up and down once per  day  Takes 5 minutes  Has not had hep C screening  - open to screening next year with labs (low risk)  Advance directive- up to date  Cognitive function addressed- see scanned forms- and if abnormal then additional documentation follows.   No concerns  Still taking of her own affairs  Not getting confused or lost  occ issues with name recall   PMH and SH reviewed  Meds, vitals, and allergies reviewed.   ROS: See HPI.  Otherwise negative.    Staying at home during the pandemic lately  Feels ok overall  Does not sleep well   Care team Cheryel Kyte-pcp Bevis- ophty Mundy-orthopedic   Weight : Wt Readings from Last 3 Encounters:  12/17/19 212 lb (96.2 kg)  01/28/18 207 lb 4 oz (94 kg)  09/03/17 207 lb 8 oz (94.1 kg)   34.74 kg/m  Glucose is 106  Some diabetes in family   Hearing/vision:  Hearing Screening   125Hz  250Hz  500Hz  1000Hz  2000Hz  3000Hz  4000Hz  6000Hz  8000Hz   Right ear:   40  40  40    Left ear:    40 40  40    Vision Screening Comments: Had vision screening in Jan at [facility] can hear ok     HTN bp is stable today  No cp or palpitations or headaches or edema  No side effects to medicines  BP Readings from Last 3 Encounters:  12/17/19 126/76  01/28/18 124/76  09/03/17 110/62     Pulse Readings from  Last 3 Encounters:  12/17/19 88  01/28/18 79  09/03/17 82    Hypothyroidism  Pt has no clinical changes No change in energy level/ hair or skin/ edema and no tremor Lab Results  Component Value Date   TSH 4.32 12/09/2019      Hyperlipidemia  Lab Results  Component Value Date   CHOL 229 (H) 12/09/2019   CHOL 193 09/04/2018   CHOL 190 08/27/2017   Lab Results  Component Value Date   HDL 58.80 12/09/2019   HDL 56.40 09/04/2018   HDL 55.50 08/27/2017   Lab Results  Component Value Date   LDLCALC 103 (H) 11/03/2013   LDLCALC 160 (H) 09/16/2013   LDLCALC 86 05/09/2006   Lab Results  Component Value Date   TRIG 291.0 (H)  12/09/2019   TRIG 229.0 (H) 09/04/2018   TRIG 219.0 (H) 08/27/2017   Lab Results  Component Value Date   CHOLHDL 4 12/09/2019   CHOLHDL 3 09/04/2018   CHOLHDL 3 08/27/2017   Lab Results  Component Value Date   LDLDIRECT 58.0 12/09/2019   LDLDIRECT 46.0 09/04/2018   LDLDIRECT 58.0 08/27/2017  simvastatin 80 mg and diet  Eating differently since the pandemic -more hot dogs  Not a lot of fried food    Patient Active Problem List   Diagnosis Date Noted   Elevated fasting glucose 12/17/2019   Medicare annual wellness visit, subsequent 08/28/2018   Cough 01/28/2018   Welcome to Medicare preventive visit 08/22/2016   Obesity 10/05/2014   Colon cancer screening 03/03/2013   Hypokalemia 10/09/2011   Routine general medical examination at a health care facility 10/02/2011   Vitamin D deficiency disease 12/28/2010   ARTHRALGIA 08/19/2007   Hypothyroidism 01/31/2007   Hyperlipidemia 01/31/2007   ANXIETY 01/31/2007   Essential hypertension 01/31/2007   GERD 01/31/2007   Past Medical History:  Diagnosis Date   Anxiety    Cataract    GERD (gastroesophageal reflux disease)    Hyperlipidemia    Hypertension    Hypothyroidism    Vitamin D deficiency    Past Surgical History:  Procedure Laterality Date   CATARACT EXTRACTION W/ INTRAOCULAR LENS IMPLANT Right 12/29/2016   Dr. Mia Creekimothy Bevis   CATARACT EXTRACTION W/ INTRAOCULAR LENS IMPLANT Left 01/15/2017   Dr. Mia Creekimothy Bevis   COLONOSCOPY     LEEP     TUBAL LIGATION     VEIN LIGATION AND STRIPPING     x2   Social History   Tobacco Use   Smoking status: Former Smoker    Quit date: 04/24/1996    Years since quitting: 23.6   Smokeless tobacco: Never Used  Substance Use Topics   Alcohol use: Yes    Alcohol/week: 0.0 standard drinks    Comment: occ. (maybe every 3 mos.)   Drug use: No   Family History  Problem Relation Age of Onset   Transient ischemic attack Mother    Hypertension  Mother    Hypertension Father    Colon cancer Neg Hx    Esophageal cancer Neg Hx    Rectal cancer Neg Hx    Stomach cancer Neg Hx    Allergies  Allergen Reactions   Amlodipine Besylate     REACTION: edema   Atorvastatin     REACTION: muscle pain   Neomycin-Bacitracin Zn-Polymyx     REACTION: rash Per pt not sure if from meds or not   Current Outpatient Medications on File Prior to Visit  Medication Sig Dispense Refill  aspirin 81 MG tablet Take 81 mg by mouth daily.     calcium carbonate (TUMS EX) 750 MG chewable tablet Chew 2 tablets by mouth at bedtime.     Calcium Citrate-Vitamin D 1000-400 LIQD Take 2 tablets by mouth daily.      Cholecalciferol (VITAMIN D) 1000 UNITS capsule Take 2,000 Units by mouth daily.      estradiol (ESTRACE) 0.1 MG/GM vaginal cream Use 2 times a week as directed (about 2 cm in applicator).      fluticasone (FLONASE) 50 MCG/ACT nasal spray USE 2 SPRAYS IN BOTH  NOSTRILS DAILY 48 g 0   losartan (COZAAR) 100 MG tablet TAKE 1 TABLET BY MOUTH  DAILY 90 tablet 1   PROAIR HFA 108 (90 Base) MCG/ACT inhaler TAKE 2 PUFFS BY MOUTH EVERY 6 HOURS AS NEEDED FOR WHEEZE OR SHORTNESS OF BREATH 8.5 Inhaler 2   No current facility-administered medications on file prior to visit.      Review of Systems  Constitutional: Negative for activity change, appetite change, fatigue, fever and unexpected weight change.  HENT: Negative for congestion, ear pain, rhinorrhea, sinus pressure and sore throat.   Eyes: Negative for pain, redness and visual disturbance.  Respiratory: Negative for cough, shortness of breath and wheezing.   Cardiovascular: Negative for chest pain and palpitations.  Gastrointestinal: Negative for abdominal pain, blood in stool, constipation and diarrhea.  Endocrine: Negative for polydipsia and polyuria.  Genitourinary: Negative for dysuria, frequency and urgency.  Musculoskeletal: Negative for arthralgias, back pain and myalgias.        Left hip pain/external Bothers her even at night    Skin: Negative for pallor and rash.  Allergic/Immunologic: Negative for environmental allergies.  Neurological: Negative for dizziness, syncope and headaches.  Hematological: Negative for adenopathy. Does not bruise/bleed easily.  Psychiatric/Behavioral: Negative for decreased concentration and dysphoric mood. The patient is not nervous/anxious.        Objective:   Physical Exam Constitutional:      General: She is not in acute distress.    Appearance: Normal appearance. She is well-developed. She is obese. She is not ill-appearing or diaphoretic.  HENT:     Head: Normocephalic and atraumatic.     Right Ear: Tympanic membrane, ear canal and external ear normal.     Left Ear: Tympanic membrane, ear canal and external ear normal.     Nose: Nose normal. No congestion.     Mouth/Throat:     Mouth: Mucous membranes are moist.     Pharynx: Oropharynx is clear. No posterior oropharyngeal erythema.  Eyes:     General: No scleral icterus.    Extraocular Movements: Extraocular movements intact.     Conjunctiva/sclera: Conjunctivae normal.     Pupils: Pupils are equal, round, and reactive to light.  Neck:     Thyroid: No thyromegaly.     Vascular: No carotid bruit or JVD.  Cardiovascular:     Rate and Rhythm: Normal rate and regular rhythm.     Pulses: Normal pulses.     Heart sounds: Normal heart sounds. No gallop.   Pulmonary:     Effort: Pulmonary effort is normal. No respiratory distress.     Breath sounds: Normal breath sounds. No wheezing.     Comments: Good air exch Chest:     Chest wall: No tenderness.  Abdominal:     General: Bowel sounds are normal. There is no distension or abdominal bruit.     Palpations: Abdomen is soft. There is no  mass.     Tenderness: There is no abdominal tenderness.     Hernia: No hernia is present.  Genitourinary:    Comments: Breast exam: No mass, nodules, thickening, tenderness, bulging,  retraction, inflamation, nipple discharge or skin changes noted.  No axillary or clavicular LA.     Musculoskeletal:        General: No tenderness. Normal range of motion.     Cervical back: Normal range of motion and neck supple. No rigidity. No muscular tenderness.     Right lower leg: No edema.     Left lower leg: No edema.     Comments: No kyphosis   Pain over L greater trochanter with hip motion   Lymphadenopathy:     Cervical: No cervical adenopathy.  Skin:    General: Skin is warm and dry.     Coloration: Skin is not pale.     Findings: No erythema or rash.     Comments: Solar lentigines diffusely Some sks  Neurological:     Mental Status: She is alert. Mental status is at baseline.     Cranial Nerves: No cranial nerve deficit.     Motor: No abnormal muscle tone.     Coordination: Coordination normal.     Gait: Gait normal.     Deep Tendon Reflexes: Reflexes are normal and symmetric. Reflexes normal.  Psychiatric:        Mood and Affect: Mood normal.        Cognition and Memory: Cognition and memory normal.           Assessment & Plan:   Problem List Items Addressed This Visit      Cardiovascular and Mediastinum   Essential hypertension    bp in fair control at this time  BP Readings from Last 1 Encounters:  12/17/19 126/76   No changes needed Most recent labs reviewed  Disc lifstyle change with low sodium diet and exercise        Relevant Medications   simvastatin (ZOCOR) 80 MG tablet   hydrochlorothiazide (HYDRODIURIL) 50 MG tablet     Endocrine   Hypothyroidism    Hypothyroidism  Pt has no clinical changes No change in energy level/ hair or skin/ edema and no tremor Lab Results  Component Value Date   TSH 4.32 12/09/2019          Relevant Medications   levothyroxine (SYNTHROID) 50 MCG tablet     Other   Hyperlipidemia    Disc goals for lipids and reasons to control them Rev last labs with pt Rev low sat fat diet in detail  LDL is up  despite simvastatin 80 Has eaten poorly and plans to improve      Relevant Medications   simvastatin (ZOCOR) 80 MG tablet   hydrochlorothiazide (HYDRODIURIL) 50 MG tablet   Vitamin D deficiency disease    Level is 37.8 Taking 4000 iu daily  Disc imp to bone and overall health  Will continue to monitor Sent for last dexa as well      Routine general medical examination at a health care facility    Reviewed health habits including diet and exercise and skin cancer prevention Reviewed appropriate screening tests for age  Also reviewed health mt list, fam hx and immunization status , as well as social and family history   See HPI Labs reviewed  Planning to get flu vaccine  Interested in shingrix if affordable at pharmacy  Is covid immunized  Called for most  recent dexa report  (no falls or fx)  Enc more exercise  Adv directive is utd  No cognitive concerns Hearing screen is fair-pt has no c/o  utd eye/vision exams         Obesity    Discussed how this problem influences overall health and the risks it imposes  Reviewed plan for weight loss with lower calorie diet (via better food choices and also portion control or program like weight watchers) and exercise building up to or more than 30 minutes 5 days per week including some aerobic activity         Medicare annual wellness visit, subsequent - Primary    Reviewed health habits including diet and exercise and skin cancer prevention Reviewed appropriate screening tests for age  Also reviewed health mt list, fam hx and immunization status , as well as social and family history   See HPI Labs reviewed  Planning to get flu vaccine  Interested in shingrix if affordable at pharmacy  Is covid immunized  Called for most recent dexa report  (no falls or fx)  Enc more exercise  Adv directive is utd  No cognitive concerns Hearing screen is fair-pt has no c/o  utd eye/vision exams       Elevated fasting glucose    Will plan  a1c next time  disc imp of low glycemic diet and wt loss to prevent DM2

## 2019-12-17 NOTE — Assessment & Plan Note (Signed)
Discussed how this problem influences overall health and the risks it imposes  Reviewed plan for weight loss with lower calorie diet (via better food choices and also portion control or program like weight watchers) and exercise building up to or more than 30 minutes 5 days per week including some aerobic activity    

## 2019-12-17 NOTE — Assessment & Plan Note (Signed)
bp in fair control at this time  BP Readings from Last 1 Encounters:  12/17/19 126/76   No changes needed Most recent labs reviewed  Disc lifstyle change with low sodium diet and exercise

## 2019-12-17 NOTE — Assessment & Plan Note (Signed)
Reviewed health habits including diet and exercise and skin cancer prevention Reviewed appropriate screening tests for age  Also reviewed health mt list, fam hx and immunization status , as well as social and family history   See HPI Labs reviewed  Planning to get flu vaccine  Interested in shingrix if affordable at pharmacy  Is covid immunized  Called for most recent dexa report  (no falls or fx)  Enc more exercise  Adv directive is utd  No cognitive concerns Hearing screen is fair-pt has no c/o  utd eye/vision exams  

## 2019-12-17 NOTE — Assessment & Plan Note (Signed)
Disc goals for lipids and reasons to control them Rev last labs with pt Rev low sat fat diet in detail  LDL is up despite simvastatin 80 Has eaten poorly and plans to improve

## 2019-12-17 NOTE — Assessment & Plan Note (Signed)
Hypothyroidism  Pt has no clinical changes No change in energy level/ hair or skin/ edema and no tremor Lab Results  Component Value Date   TSH 4.32 12/09/2019

## 2019-12-17 NOTE — Assessment & Plan Note (Signed)
Will plan a1c next time  disc imp of low glycemic diet and wt loss to prevent DM2

## 2019-12-17 NOTE — Patient Instructions (Addendum)
Get a flu vaccine in the fall   If you are interested in the new shingles vaccine (Shingrix) - call your local pharmacy to check on coverage and availability  If affordable, get on a wait list at your pharmacy to get the vaccine.  Stop at checkout to sign a release to call for your bone density report   Work up to 30 minutes of exercise daily  Walking , use your equipment , also any strengthening if good for your bones   Try to get most of your carbohydrates from produce (with the exception of white potatoes)  Eat less bread/pasta/rice/snack foods/cereals/sweets and other items from the middle of the grocery store (processed carbs)  For cholesterol  Avoid red meat/ fried foods/ egg yolks/ fatty breakfast meats/ butter, cheese and high fat dairy/ and shellfish

## 2019-12-17 NOTE — Assessment & Plan Note (Signed)
Level is 37.8 Taking 4000 iu daily  Disc imp to bone and overall health  Will continue to monitor Sent for last dexa as well

## 2019-12-17 NOTE — Assessment & Plan Note (Signed)
Reviewed health habits including diet and exercise and skin cancer prevention Reviewed appropriate screening tests for age  Also reviewed health mt list, fam hx and immunization status , as well as social and family history   See HPI Labs reviewed  Planning to get flu vaccine  Interested in shingrix if affordable at pharmacy  Is covid immunized  Called for most recent dexa report  (no falls or fx)  Enc more exercise  Adv directive is utd  No cognitive concerns Hearing screen is fair-pt has no c/o  utd eye/vision exams

## 2020-04-07 DIAGNOSIS — Z1231 Encounter for screening mammogram for malignant neoplasm of breast: Secondary | ICD-10-CM | POA: Diagnosis not present

## 2020-06-24 DIAGNOSIS — Z9842 Cataract extraction status, left eye: Secondary | ICD-10-CM | POA: Diagnosis not present

## 2020-06-24 DIAGNOSIS — H0288A Meibomian gland dysfunction right eye, upper and lower eyelids: Secondary | ICD-10-CM | POA: Diagnosis not present

## 2020-06-24 DIAGNOSIS — H52213 Irregular astigmatism, bilateral: Secondary | ICD-10-CM | POA: Diagnosis not present

## 2020-06-24 DIAGNOSIS — H04123 Dry eye syndrome of bilateral lacrimal glands: Secondary | ICD-10-CM | POA: Diagnosis not present

## 2020-06-24 DIAGNOSIS — Z9841 Cataract extraction status, right eye: Secondary | ICD-10-CM | POA: Diagnosis not present

## 2020-06-24 DIAGNOSIS — H16223 Keratoconjunctivitis sicca, not specified as Sjogren's, bilateral: Secondary | ICD-10-CM | POA: Diagnosis not present

## 2020-06-24 DIAGNOSIS — H0288B Meibomian gland dysfunction left eye, upper and lower eyelids: Secondary | ICD-10-CM | POA: Diagnosis not present

## 2020-06-29 ENCOUNTER — Telehealth: Payer: Self-pay

## 2020-06-29 NOTE — Telephone Encounter (Signed)
[facility] Day - Client TELEPHONE ADVICE RECORD [facility] Patient Name: Sharon [name] Gender: Female DOB: 04-23-1951 Age: 70 Y 5 M 16 D Return Phone Number: [phone] (Primary), [phone] (Secondary) Address: [name] [address] Client [facility] Day - Client Client Site [facility] - Day Physician Milinda Antis, Idamae Schuller - MD Contact Type Call Who Is Calling Patient / Member / Family / Caregiver Call Type Triage / Clinical Relationship To Patient Self Return Phone Number [phone] (Primary) Chief Complaint ABDOMINAL PAIN - Severe and only in abdomen Reason for Call Symptomatic / Request for Health Information Initial Comment Caller has fallen and having extreme pain in stomach, shoulder, and side. GOTO Facility Not Listed will go to urgent care Translation No Nurse Assessment Nurse: Alexander Mt, RN, Nicholaus Bloom Date/Time (Eastern Time): 06/29/2020 11:59:36 AM Confirm and document reason for call. If symptomatic, describe symptoms. ---Caller fell about 430 on saturday morning and having extreme pain in right upper stomach, right shoulder and arm, and side. States it hurts to breath now. states her side and arm are bruised. States she can move her arm and shoulder. Does the patient have any new or worsening symptoms? ---Yes Will a triage be completed? ---Yes Related visit to physician within the last 2 weeks? ---No Does the PT have any chronic conditions? (i.e. diabetes, asthma, this includes High risk factors for pregnancy, etc.) ---Yes List chronic conditions. ---HTN, cholesterol, Is this a behavioral health or substance abuse call? ---No Guidelines Guideline Title Affirmed Question Affirmed Notes Nurse Date/Time (Eastern Time) Falls and Falling [1] MODERATE weakness (i.e., interferes with work, school, normal activities) AND [2] newonset or worsening Alexander Mt, Nurse, adult 06/29/2020 12:02:28  PM Disp. Time Lamount Cohen Time) Disposition Final User 06/29/2020 11:58:43 AM Send to Urgent Macon Large, [facility] PLEASE NOTE: All timestamps contained within this report are represented as [place] Standard Time. CONFIDENTIALTY NOTICE: This fax transmission is intended only for the addressee. It contains information that is legally privileged, confidential or otherwise protected from use or disclosure. If you are not the intended recipient, you are strictly prohibited from reviewing, disclosing, copying using or disseminating any of this information or taking any action in reliance on or regarding this information. If you have received this fax in error, please notify us immediately by telephone so that we can arrange for its return to [place]. Phone: [phone], Toll-Free: [phone], Fax: [phone] Page: 2 of 2 Call Id: [id number] 06/29/2020 12:08:49 PM See HCP within 4 Hours (or PCP triage) Yes Alexander Mt, RN, Prentiss Bells Disagree/Comply Comply Caller Understands Yes PreDisposition Did not know what to do Care Advice Given Per Guideline SEE HCP (OR PCP TRIAGE) WITHIN 4 HOURS: * IF OFFICE WILL BE OPEN: You need to be seen within the next 3 or 4 hours. Call your doctor (or NP/PA) now or as soon as the office opens. * IF OFFICE WILL BE CLOSED AND NO PCP (PRIMARY CARE PROVIDER) SECOND-LEVEL TRIAGE: You need to be seen within the next 3 or 4 hours. A nearby Urgent Care Center [facility]) is often a good source of care. Another choice is to go to the ED. Go sooner if you become worse. BRING MEDICINES: * Please bring a list of your current medicines when you go to see the doctor. * It is also a good idea to bring the pill bottles too. This will help the doctor to make certain you are taking the right medicines and the right dose. CALL BACK IF: * You become  worse CARE ADVICE given per Fall and Falling (Adult) guideline. Referrals GO TO FACILITY OTHER - SPECIFY

## 2020-06-29 NOTE — Telephone Encounter (Signed)
Per chart review tab pt is at a [facility].

## 2020-06-29 NOTE — Telephone Encounter (Signed)
Aware and will watch for correspondence 

## 2020-07-02 ENCOUNTER — Inpatient Hospital Stay: Payer: Medicare Other | Admitting: Family Medicine

## 2020-07-02 ENCOUNTER — Telehealth: Payer: Self-pay

## 2020-07-02 MED ORDER — HYDROCODONE-ACETAMINOPHEN 5-325 MG PO TABS
1.0000 | ORAL_TABLET | Freq: Four times a day (QID) | ORAL | 0 refills | Status: DC | PRN
Start: 2020-07-02 — End: 2020-08-23

## 2020-07-02 MED ORDER — HYDROCODONE-ACETAMINOPHEN 5-300 MG PO TABS
1.0000 | ORAL_TABLET | Freq: Four times a day (QID) | ORAL | 0 refills | Status: DC | PRN
Start: 2020-07-02 — End: 2020-08-23

## 2020-07-02 NOTE — Addendum Note (Signed)
Addended by: Roxy Manns A on: 07/02/2020 11:53 AM   Modules accepted: Orders

## 2020-07-02 NOTE — Telephone Encounter (Signed)
[facility] I hope this helps

## 2020-07-02 NOTE — Telephone Encounter (Signed)
Called patient to let her know that rx was sent in but [facility] is out [facility]; [facility] on [facility] backorder [facility]. Would like rx sent to [facility] on [address] by [employer].

## 2020-07-02 NOTE — Telephone Encounter (Signed)
I sent it  ?Thanks ?

## 2020-07-02 NOTE — Telephone Encounter (Signed)
Spoke with [facility] and they have hydrocodone 5-300 mg tablets they can fill if you send in a new rx.

## 2020-07-02 NOTE — Telephone Encounter (Signed)
Can someone find out what alternative they do have?   I am about to be off the computer for the rest of the day.

## 2020-07-02 NOTE — Addendum Note (Signed)
Addended by: Roxy Manns A on: 07/02/2020 07:34 PM   Modules accepted: Orders

## 2020-07-02 NOTE — Telephone Encounter (Signed)
Pt called requesting refill of pain medication following rib fracture... Pt had appt today that had been rescheduled for Monday with Baity... Pt reports she was prescribed Norco 5-325 #12 and has 6 pills left... She will run out before Monday... Please advise on what pt can take in the meantime.Marland KitchenMarland Kitchen

## 2020-07-05 ENCOUNTER — Other Ambulatory Visit: Payer: Self-pay

## 2020-07-05 ENCOUNTER — Encounter: Payer: Self-pay | Admitting: Internal Medicine

## 2020-07-05 ENCOUNTER — Ambulatory Visit (INDEPENDENT_AMBULATORY_CARE_PROVIDER_SITE_OTHER): Payer: Medicare Other | Admitting: Internal Medicine

## 2020-07-05 VITALS — BP 132/84 | HR 85 | Temp 96.9°F | Wt 212.0 lb

## 2020-07-05 DIAGNOSIS — S2241XD Multiple fractures of ribs, right side, subsequent encounter for fracture with routine healing: Secondary | ICD-10-CM

## 2020-07-05 DIAGNOSIS — W19XXXD Unspecified fall, subsequent encounter: Secondary | ICD-10-CM

## 2020-07-05 DIAGNOSIS — R42 Dizziness and giddiness: Secondary | ICD-10-CM | POA: Diagnosis not present

## 2020-07-05 DIAGNOSIS — S50819A Abrasion of unspecified forearm, initial encounter: Secondary | ICD-10-CM

## 2020-07-05 DIAGNOSIS — S5011XD Contusion of right forearm, subsequent encounter: Secondary | ICD-10-CM

## 2020-07-05 NOTE — Progress Notes (Signed)
Subjective:    Patient ID: Sharon [name], female    DOB: 1951-03-21, 70 y.o.   MRN: [id number]  HPI  Patient presents to the clinic today for UC follow-up for fall.  She reports her fall occurred on 3/5 due to getting up too fast.  She landed on her right side.  She presented to urgent care 3/8 with complaint of pain in her right side ribs and right collarbone.  X-ray of the clavicle was negative for fracture.  X-ray of the ribs showed displaced eighth and ninth right rib fractures.  She was advised to get an abdominal binder and an incentive spirometer from her local pharmacy.  She was advised to take ibuprofen and Tylenol OTC and given a prescription for hydrocodone to take for severe pain.  She was discharged and advised to follow-up with her PCP in 3 days.  Since discharge, she reports her pain has been fairly well controlled until today. She reports the pain is intermittent, sharp and worse with movement. She did not get the abdominal binder or incentive spirometer. She is taking Hydrocodone at night only, she just got this refilled today.  Of note, she does report some intermittent dizziness for the last few months. She does not think this is what caused her fall. She notices it when she wakes up in the morning and starts walking around. She reports it is more a sense of imbalance, not that the room is spinning. It will resolve with laying down. She does drink plenty of water. She denies recent changes in medication outside the Hydrocodone but she was having dizziness prior to that. She has never had issues with vertigo. She has not tried anything OTC for this.  Review of Systems  Past Medical History:  Diagnosis Date  . Anxiety   . Cataract   . GERD (gastroesophageal reflux disease)   . Hyperlipidemia   . Hypertension   . Hypothyroidism   . Vitamin D deficiency     Current Outpatient Medications  Medication Sig Dispense Refill  . aspirin 81 MG tablet Take 81 mg by mouth daily.     Marland Kitchen buPROPion (WELLBUTRIN XL) 150 MG 24 hr tablet Take 1 tablet (150 mg total) by mouth daily. 90 tablet 3  . calcium carbonate (TUMS EX) 750 MG chewable tablet Chew 2 tablets by mouth at bedtime.    . Calcium Citrate-Vitamin D 1000-400 LIQD Take 2 tablets by mouth daily.     . Cholecalciferol (VITAMIN D) 1000 UNITS capsule Take 2,000 Units by mouth daily.     Marland Kitchen estradiol (ESTRACE) 0.1 MG/GM vaginal cream Use 2 times a week as directed (about 2 cm in applicator).     . fluticasone (FLONASE) 50 MCG/ACT nasal spray USE 2 SPRAYS IN BOTH  NOSTRILS DAILY 48 g 0  . hydrochlorothiazide (HYDRODIURIL) 50 MG tablet Take 1 tablet (50 mg total) by mouth daily. 90 tablet 3  . HYDROcodone-acetaminophen (NORCO) 5-325 MG tablet Take 1 tablet by mouth every 6 (six) hours as needed for moderate pain. 12 tablet 0  . HYDROcodone-Acetaminophen 5-300 MG TABS Take 1 tablet by mouth every 6 (six) hours as needed. 12 tablet 0  . levothyroxine (SYNTHROID) 50 MCG tablet TAKE 1 TABLET BY MOUTH  DAILY BEFORE BREAKFAST 90 tablet 3  . losartan (COZAAR) 100 MG tablet TAKE 1 TABLET BY MOUTH  DAILY 90 tablet 1  . pantoprazole (PROTONIX) 40 MG tablet Take 1 tablet (40 mg total) by mouth daily. 90 tablet 3  .  potassium chloride (KLOR-CON) 10 MEQ tablet Take 1 tablet (10 mEq total) by mouth daily. 90 tablet 3  . PROAIR HFA 108 (90 Base) MCG/ACT inhaler TAKE 2 PUFFS BY MOUTH EVERY 6 HOURS AS NEEDED FOR WHEEZE OR SHORTNESS OF BREATH 8.5 Inhaler 2  . sertraline (ZOLOFT) 100 MG tablet Take 1 tablet (100 mg total) by mouth daily. 90 tablet 3  . simvastatin (ZOCOR) 80 MG tablet Take 1 tablet (80 mg total) by mouth daily. 90 tablet 3   No current facility-administered medications for this visit.    Allergies  Allergen Reactions  . Amlodipine Besylate     REACTION: edema  . Atorvastatin     REACTION: muscle pain  . Neomycin-Bacitracin Zn-Polymyx     REACTION: rash Per pt not sure if from meds or not    Family History  Problem  Relation Age of Onset  . Transient ischemic attack Mother   . Hypertension Mother   . Hypertension Father   . Colon cancer Neg Hx   . Esophageal cancer Neg Hx   . Rectal cancer Neg Hx   . Stomach cancer Neg Hx     Social History   Socioeconomic History  . Marital status: Married    Spouse name: Not on file  . Number of children: Not on file  . Years of education: Not on file  . Highest education level: Not on file  Occupational History  . Not on file  Tobacco Use  . Smoking status: Former Smoker    Quit date: 04/24/1996    Years since quitting: 24.2  . Smokeless tobacco: Never Used  Substance and Sexual Activity  . Alcohol use: Yes    Alcohol/week: 0.0 standard drinks    Comment: occ. (maybe every 3 mos.)  . Drug use: No  . Sexual activity: Not on file  Other Topics Concern  . Not on file  Social History Narrative  . Not on file   Social Determinants of Health   Financial Resource Strain: Not on file  Food Insecurity: Not on file  Transportation Needs: Not on file  Physical Activity: Not on file  Stress: Not on file  Social Connections: Not on file  Intimate Partner Violence: Not on file     Constitutional: Denies fever, malaise, fatigue, headache or abrupt weight changes.  HEENT: Denies eye pain, eye redness, ear pain, ringing in the ears, wax buildup, runny nose, nasal congestion, bloody nose, or sore throat. Respiratory: Denies difficulty breathing, shortness of breath, cough or sputum production.   Cardiovascular: Denies chest pain, chest tightness, palpitations or swelling in the hands or feet.  Musculoskeletal: Patient reports right-sided rib pain.  Denies difficulty with gait, or joint swelling.  Skin: Pt reports abrasion and bruising to right forearm, right side ribs. Denies redness, rashes, lesions or ulcercations.  Neurological: Pt reports intermittent dizziness. Denies difficulty with memory, difficulty with speech or problems with balance and  coordination.    No other specific complaints in a complete review of systems (except as listed in HPI above).     Objective:   Physical Exam   BP 132/84   Pulse 85   Temp (!) 96.9 F (36.1 C) (Temporal)   Wt 212 lb (96.2 kg)   SpO2 98%   BMI 34.74 kg/m   Wt Readings from Last 3 Encounters:  12/17/19 212 lb (96.2 kg)  01/28/18 207 lb 4 oz (94 kg)  09/03/17 207 lb 8 oz (94.1 kg)    General: Appears her  stated age, obese, in NAD. Skin: Brusing from right wrist to right elbow. Abrasion of mid right forearm, scabbed, not infected. Bruising noted of the right lateral ribs. Abrasion noted to the right lateral ribs. HEENT: Head: normal shape and size; Eyes: sclera white, no icterus, conjunctiva pink, PERRLA and EOMs intact; Ears: Tm's gray and intact, normal light reflex;  Cardiovascular: Normal rate and rhythm. S1,S2 noted.  No murmur, rubs or gallops noted.  Pulmonary/Chest: Normal effort and positive vesicular breath sounds. No respiratory distress. No wheezes, rales or ronchi noted.  Musculoskeletal: Did not palpate the right side ribs as she is already in pain today. No pain with palpation of the right clavicle or forearm. Strength 4/5 RUE, 5/5 LUE. Gait slow and steady without device. Neurological: Alert and oriented. Coordination normal.    BMET    Component Value Date/Time   NA 139 12/09/2019 1057   K 3.9 12/09/2019 1057   CL 100 12/09/2019 1057   CO2 31 12/09/2019 1057   GLUCOSE 106 (H) 12/09/2019 1057   BUN 18 12/09/2019 1057   CREATININE 0.92 12/09/2019 1057   CALCIUM 10.1 12/09/2019 1057   GFRNONAA 61 03/02/2008 1047   GFRAA 73 03/02/2008 1047    Lipid Panel     Component Value Date/Time   CHOL 229 (H) 12/09/2019 1057   TRIG 291.0 (H) 12/09/2019 1057   HDL 58.80 12/09/2019 1057   CHOLHDL 4 12/09/2019 1057   VLDL 58.2 (H) 12/09/2019 1057   LDLCALC 103 (H) 11/03/2013 0925    CBC    Component Value Date/Time   WBC 7.1 12/09/2019 1057   RBC 3.90  12/09/2019 1057   HGB 12.6 12/09/2019 1057   HCT 37.9 12/09/2019 1057   PLT 261.0 12/09/2019 1057   MCV 97.1 12/09/2019 1057   MCHC 33.4 12/09/2019 1057   RDW 14.1 12/09/2019 1057   LYMPHSABS 2.4 12/09/2019 1057   MONOABS 0.4 12/09/2019 1057   EOSABS 0.3 12/09/2019 1057   BASOSABS 0.0 12/09/2019 1057    Hgb A1C No results found for: HGBA1C         Assessment & Plan:   UC Follow Up for Fall, Right Side Rib Fractures:  UC notes and imaging reviewed Advised her not to get the abdominal binder and discussed splinting with a pillow when she sneezes or coughs Advised her to try not to use Hydrocodone unless she is in severe pain, would be best to use Tylenol up to 1000 mg every 8 hours. Encouraged deep breathing to prevent pneumonia Encouraged ice/heat for comfort Discussed that we would likely not repeat xray of the right side ribs unless pain does not improve after 4-6 weeks  Intermittent Dizziness:  Offered to do orthostatics, blood work and ECG but pt declined at this time, would prefer workup to be done by PCP    Return precautions discussed Nicki Reaper, NP This visit occurred during the SARS-CoV-2 public health emergency.  Safety protocols were in place, including screening questions prior to the visit, additional usage of staff PPE, and extensive cleaning of exam room while observing appropriate contact time as indicated for disinfecting solutions.

## 2020-07-05 NOTE — Patient Instructions (Signed)
Fall Prevention in the Home, Adult Falls can cause injuries and can happen to people of all ages. There are many things you can do to make your home safe and to help prevent falls. Ask for help when making these changes. What actions can I take to prevent falls? General Instructions  Use good lighting in all rooms. Replace any light bulbs that burn out.  Turn on the lights in dark areas. Use night-lights.  Keep items that you use often in easy-to-reach places. Lower the shelves around your home if needed.  Set up your furniture so you have a clear path. Avoid moving your furniture around.  Do not have throw rugs or other things on the floor that can make you trip.  Avoid walking on wet floors.  If any of your floors are uneven, fix them.  Add color or contrast paint or tape to clearly mark and help you see: ? Grab bars or handrails. ? First and last steps of staircases. ? Where the edge of each step is.  If you use a stepladder: ? Make sure that it is fully opened. Do not climb a closed stepladder. ? Make sure the sides of the stepladder are locked in place. ? Ask someone to hold the stepladder while you use it.  Know where your pets are when moving through your home. What can I do in the bathroom?  Keep the floor dry. Clean up any water on the floor right away.  Remove soap buildup in the tub or shower.  Use nonskid mats or decals on the floor of the tub or shower.  Attach bath mats securely with double-sided, nonslip rug tape.  If you need to sit down in the shower, use a plastic, nonslip stool.  Install grab bars by the toilet and in the tub and shower. Do not use towel bars as grab bars.      What can I do in the bedroom?  Make sure that you have a light by your bed that is easy to reach.  Do not use any sheets or blankets for your bed that hang to the floor.  Have a firm chair with side arms that you can use for support when you get dressed. What can I do in  the kitchen?  Clean up any spills right away.  If you need to reach something above you, use a step stool with a grab bar.  Keep electrical cords out of the way.  Do not use floor polish or wax that makes floors slippery. What can I do with my stairs?  Do not leave any items on the stairs.  Make sure that you have a light switch at the top and the bottom of the stairs.  Make sure that there are handrails on both sides of the stairs. Fix handrails that are broken or loose.  Install nonslip stair treads on all your stairs.  Avoid having throw rugs at the top or bottom of the stairs.  Choose a carpet that does not hide the edge of the steps on the stairs.  Check carpeting to make sure that it is firmly attached to the stairs. Fix carpet that is loose or worn. What can I do on the outside of my home?  Use bright outdoor lighting.  Fix the edges of walkways and driveways and fix any cracks.  Remove anything that might make you trip as you walk through a door, such as a raised step or threshold.  Trim any   bushes or trees on paths to your home.  Check to see if handrails are loose or broken and that both sides of all steps have handrails.  Install guardrails along the edges of any raised decks and porches.  Clear paths of anything that can make you trip, such as tools or rocks.  Have leaves, snow, or ice cleared regularly.  Use sand or salt on paths during winter.  Clean up any spills in your garage right away. This includes grease or oil spills. What other actions can I take?  Wear shoes that: ? Have a low heel. Do not wear high heels. ? Have rubber bottoms. ? Feel good on your feet and fit well. ? Are closed at the toe. Do not wear open-toe sandals.  Use tools that help you move around if needed. These include: ? Canes. ? Walkers. ? Scooters. ? Crutches.  Review your medicines with your doctor. Some medicines can make you feel dizzy. This can increase your chance  of falling. Ask your doctor what else you can do to help prevent falls. Where to find more information  Centers for Disease Control and Prevention, [employer]AD[employer]: www.cdc.gov  National Institute on Aging: www.nia.nih.gov Contact a doctor if:  You are afraid of falling at home.  You feel weak, drowsy, or dizzy at home.  You fall at home. Summary  There are many simple things that you can do to make your home safe and to help prevent falls.  Ways to make your home safe include removing things that can make you trip and installing grab bars in the bathroom.  Ask for help when making these changes in your home. This information is not intended to replace advice given to you by your health care provider. Make sure you discuss any questions you have with your health care provider. Document Revised: 11/12/2019 Document Reviewed: 11/12/2019 Elsevier Patient Education  2021 [employer]vier Inc.  

## 2020-08-15 ENCOUNTER — Other Ambulatory Visit: Payer: Self-pay | Admitting: Family Medicine

## 2020-08-16 ENCOUNTER — Telehealth: Payer: Self-pay

## 2020-08-16 NOTE — Telephone Encounter (Signed)
Please see note from [facility] RN below access nurse note.

## 2020-08-16 NOTE — Telephone Encounter (Signed)
[facility] - Client TELEPHONE ADVICE RECORD AccessNurse Patient Name: Sharon [name] Gender: Female DOB: 16-Jul-1950 Age: 70 Y 7 M 5 D Return Phone Number: [phone] (Primary), [phone] (Secondary) Address: City/ State/ Zip: [place] [address] Client [facility] - Client Client Site [facility] - Day Physician Milinda Antis, Idamae Schuller - MD Contact Type Call Who Is Calling Patient / Member / Family / Caregiver Call Type Triage / Clinical Relationship To Patient Self Return Phone Number [phone] (Secondary) Chief Complaint Dizziness Reason for Call Symptomatic / Request for Health Information Initial Comment Caller feels light headed, dizziness, and off balance. Caller said this has since she cracked her ribs. Translation No Nurse Assessment Nurse: Charna Elizabeth, RN, Cathy Date/Time (Eastern Time): 08/16/2020 11:23:00 AM Confirm and document reason for call. If symptomatic, describe symptoms. ---Raivyn states that she cracked her ribs in June 26, 2020 and has had dizziness since the injury. No impact to her head. No severe breathing difficulty or blueness around her lips. She feels like she can take a deep breath in. Alert and responsive. Does the patient have any new or worsening symptoms? ---Yes Will a triage be completed? ---Yes Related visit to physician within the last 2 weeks? ---Yes Does the PT have any chronic conditions? (i.e. diabetes, asthma, this includes High risk factors for pregnancy, etc.) ---Yes List chronic conditions. ---Thyroid problems, High Blood Pressure and Cholesterol Is this a behavioral health or substance abuse call? ---No Guidelines Guideline Title Affirmed Question Affirmed Notes Nurse Date/Time (Eastern Time) Dizziness - Lightheadedness [1] MODERATE dizziness (e.g., interferes with normal activities) AND [2] has NOT been evaluated by physician for  this Charna Elizabeth, RN, Lynden Ang 08/16/2020 11:25:33 AM PLEASE NOTE: All timestamps contained within this report are represented as [place] Standard Time. CONFIDENTIALTY NOTICE: This fax transmission is intended only for the addressee. It contains information that is legally privileged, confidential or otherwise protected from use or disclosure. If you are not the intended recipient, you are strictly prohibited from reviewing, disclosing, copying using or disseminating any of this information or taking any action in reliance on or regarding this information. If you have received this fax in error, please notify us immediately by telephone so that we can arrange for its return to [place]. Phone: [phone], Toll-Free: [phone], Fax: [phone] Page: 2 of 2 Call Id: [id number] Guidelines Guideline Title Affirmed Question Affirmed Notes Nurse Date/Time Lamount Cohen Time) (Exception: dizziness caused by heat exposure, sudden standing, or poor fluid intake) Disp. Time Lamount Cohen Time) Disposition Final User 08/16/2020 11:21:58 AM Attempt made - no message left Charna Elizabeth, RN, Lynden Ang 08/16/2020 11:28:39 AM See PCP within 24 Hours Yes [place], RN, Lynden Ang Caller Disagree/Comply Comply Caller Understands Yes PreDisposition Call Doctor Care Advice Given Per Guideline SEE PCP WITHIN 24 HOURS: * IF OFFICE WILL BE OPEN: You need to be examined within the next 24 hours. Call your doctor (or NP/PA) when the office opens and make an appointment. DRINK FLUIDS: * Drink several glasses of fruit juice, other clear fluids or water. * This will improve hydration and blood glucose. * If the weather is hot or you have a fever, make sure the fluids are cold. LIE DOWN AND REST: * Lie down with feet elevated for 1 hour. * This will improve circulation and increase blood flow to the brain. * You become worse * Passes out (faints) CALL BACK IF: CARE ADVICE given per Dizziness (Adult) guideline. Comments User: Colonel Bald, RN  Date/Time Lamount Cohen Time):  08/16/2020 11:21:45 AM Voice mailbox has not been set up on primary phone number. Referrals REFERRED TO PCP OFFICE

## 2020-08-16 NOTE — Telephone Encounter (Signed)
Patient called and stated that since early March she has been experiencing dizziness intermittently. Patient stated that she does not feel that it is getting better or worse, but remaining the same. Patient reports that this mostly happens when she stands up. Patient denies SOB, chest pain, heart palpitations, recent falls with injuries, nausea or vomiting. Patient stated that approximately a week ago she experienced double vision, but it went away shortly after it started. Patient stated that she is steady on her feet. Patient is scheduled to see Dr. Milinda Antis on Monday (5/2) at 11:00. Patient did not want to see another provider. Instructed patient to keep a log of her blood pressures with the date, time, and symptoms she is feeling at that time and bring to her appointment. UC and ED precautions given to patient. Patient verbalized understanding.

## 2020-08-23 ENCOUNTER — Other Ambulatory Visit: Payer: Self-pay

## 2020-08-23 ENCOUNTER — Ambulatory Visit (INDEPENDENT_AMBULATORY_CARE_PROVIDER_SITE_OTHER): Payer: Medicare Other | Admitting: Family Medicine

## 2020-08-23 ENCOUNTER — Encounter: Payer: Self-pay | Admitting: Family Medicine

## 2020-08-23 VITALS — BP 132/78 | HR 94 | Temp 97.0°F | Ht 65.5 in | Wt 210.4 lb

## 2020-08-23 DIAGNOSIS — R42 Dizziness and giddiness: Secondary | ICD-10-CM | POA: Diagnosis not present

## 2020-08-23 DIAGNOSIS — I10 Essential (primary) hypertension: Secondary | ICD-10-CM | POA: Diagnosis not present

## 2020-08-23 DIAGNOSIS — S2239XA Fracture of one rib, unspecified side, initial encounter for closed fracture: Secondary | ICD-10-CM | POA: Insufficient documentation

## 2020-08-23 DIAGNOSIS — E559 Vitamin D deficiency, unspecified: Secondary | ICD-10-CM | POA: Diagnosis not present

## 2020-08-23 DIAGNOSIS — E039 Hypothyroidism, unspecified: Secondary | ICD-10-CM | POA: Diagnosis not present

## 2020-08-23 DIAGNOSIS — R5382 Chronic fatigue, unspecified: Secondary | ICD-10-CM

## 2020-08-23 DIAGNOSIS — S2241XS Multiple fractures of ribs, right side, sequela: Secondary | ICD-10-CM

## 2020-08-23 DIAGNOSIS — R5383 Other fatigue: Secondary | ICD-10-CM | POA: Insufficient documentation

## 2020-08-23 LAB — CBC WITH DIFFERENTIAL/PLATELET
Basophils Absolute: 0 10*3/uL (ref 0.0–0.1)
Basophils Relative: 0.6 % (ref 0.0–3.0)
Eosinophils Absolute: 0.3 10*3/uL (ref 0.0–0.7)
Eosinophils Relative: 3.6 % (ref 0.0–5.0)
HCT: 39 % (ref 36.0–46.0)
Hemoglobin: 13.1 g/dL (ref 12.0–15.0)
Lymphocytes Relative: 33.8 % (ref 12.0–46.0)
Lymphs Abs: 2.4 10*3/uL (ref 0.7–4.0)
MCHC: 33.5 g/dL (ref 30.0–36.0)
MCV: 94.2 fl (ref 78.0–100.0)
Monocytes Absolute: 0.6 10*3/uL (ref 0.1–1.0)
Monocytes Relative: 8.5 % (ref 3.0–12.0)
Neutro Abs: 3.9 10*3/uL (ref 1.4–7.7)
Neutrophils Relative %: 53.5 % (ref 43.0–77.0)
Platelets: 266 10*3/uL (ref 150.0–400.0)
RBC: 4.14 Mil/uL (ref 3.87–5.11)
RDW: 13.6 % (ref 11.5–15.5)
WBC: 7.2 10*3/uL (ref 4.0–10.5)

## 2020-08-23 LAB — COMPREHENSIVE METABOLIC PANEL
ALT: 10 U/L (ref 0–35)
AST: 15 U/L (ref 0–37)
Albumin: 4.3 g/dL (ref 3.5–5.2)
Alkaline Phosphatase: 70 U/L (ref 39–117)
BUN: 16 mg/dL (ref 6–23)
CO2: 32 mEq/L (ref 19–32)
Calcium: 9.9 mg/dL (ref 8.4–10.5)
Chloride: 100 mEq/L (ref 96–112)
Creatinine, Ser: 0.96 mg/dL (ref 0.40–1.20)
GFR: 60.32 mL/min (ref >60.00)
Glucose, Bld: 81 mg/dL (ref 70–99)
Potassium: 3.3 mEq/L — ABNORMAL LOW (ref 3.5–5.1)
Sodium: 140 mEq/L (ref 135–145)
Total Bilirubin: 0.5 mg/dL (ref 0.2–1.2)
Total Protein: 6.9 g/dL (ref 6.0–8.3)

## 2020-08-23 LAB — TSH: TSH: 2.21 u[IU]/mL (ref 0.35–4.50)

## 2020-08-23 LAB — VITAMIN D 25 HYDROXY (VIT D DEFICIENCY, FRACTURES): VITD: 47.04 ng/mL (ref 30.00–100.00)

## 2020-08-23 LAB — IRON: Iron: 97 ug/dL (ref 42–145)

## 2020-08-23 NOTE — Assessment & Plan Note (Signed)
Some fatigue (may be situational) TSH today

## 2020-08-23 NOTE — Progress Notes (Signed)
Subjective:    Patient ID: Sharon [name], female    DOB: 1950/10/02, 70 y.o.   MRN: [id number]  This visit occurred during the SARS-CoV-2 public health emergency.  Safety protocols were in place, including screening questions prior to the visit, additional usage of staff PPE, and extensive cleaning of exam room while observing appropriate contact time as indicated for disinfecting solutions.    HPI Pt presents for c/o dizziness (episodic)  Wt Readings from Last 3 Encounters:  08/23/20 210 lb 7 oz (95.5 kg)  07/05/20 212 lb (96.2 kg)  12/17/19 212 lb (96.2 kg)   34.49 kg/m  Had rib fx in march and has had symptoms of dizziness since then  Fall happened when getting up too fast  No head injury  Saw NP Baity and noted more of a sense of imbalance then spinning and worse orthostatic   Walking from car to door- notes she has to hold on to anything  Unsteady as well  Episodes start after she has been up a few minutes (lasting a minute or so)- woozy No LOC  Has had headaches off and on  No change in diet   Rib pain is finally improved   BP Readings from Last 3 Encounters:  08/23/20 132/78  07/05/20 132/84  12/17/19 126/76  bp not too low at home   Pulse Readings from Last 3 Encounters:  08/23/20 94  07/05/20 85  12/17/19 88   hctz 50 mg daily  Losartan 100 mg daily  Orthostatic  138/85 lying and sitting today -no change in this with pulse in low 90s  EKG today - NSR with rate of 81 and no acute chagnes  Patient Active Problem List   Diagnosis Date Noted  . Dizziness 08/23/2020  . Fatigue 08/23/2020  . Rib fracture 08/23/2020  . Elevated fasting glucose 12/17/2019  . Encounter for hepatitis C screening test for low risk patient 12/17/2019  . Medicare annual wellness visit, subsequent 08/28/2018  . Cough 01/28/2018  . Welcome to Medicare preventive visit 08/22/2016  . Obesity 10/05/2014  . Colon cancer screening 03/03/2013  . Hypokalemia 10/09/2011  .  Routine general medical examination at a health care facility 10/02/2011  . Vitamin D deficiency disease 12/28/2010  . ARTHRALGIA 08/19/2007  . Hypothyroidism 01/31/2007  . Hyperlipidemia 01/31/2007  . ANXIETY 01/31/2007  . Essential hypertension 01/31/2007  . GERD 01/31/2007   Past Medical History:  Diagnosis Date  . Anxiety   . Cataract   . GERD (gastroesophageal reflux disease)   . Hyperlipidemia   . Hypertension   . Hypothyroidism   . Vitamin D deficiency    Past Surgical History:  Procedure Laterality Date  . CATARACT EXTRACTION W/ INTRAOCULAR LENS IMPLANT Right 12/29/2016   Dr. Mia Creek  . CATARACT EXTRACTION W/ INTRAOCULAR LENS IMPLANT Left 01/15/2017   Dr. Mia Creek  . COLONOSCOPY    . LEEP    . TUBAL LIGATION    . VEIN LIGATION AND STRIPPING     x2   Social History   Tobacco Use  . Smoking status: Former Smoker    Quit date: 04/24/1996    Years since quitting: 24.3  . Smokeless tobacco: Never Used  Substance Use Topics  . Alcohol use: Yes    Alcohol/week: 0.0 standard drinks    Comment: occ. (maybe every 3 mos.)  . Drug use: No   Family History  Problem Relation Age of Onset  . Transient ischemic attack Mother   . Hypertension  Mother   . Hypertension Father   . Colon cancer Neg Hx   . Esophageal cancer Neg Hx   . Rectal cancer Neg Hx   . Stomach cancer Neg Hx    Allergies  Allergen Reactions  . Amlodipine Besylate     REACTION: edema  . Atorvastatin     REACTION: muscle pain  . Neomycin-Bacitracin Zn-Polymyx     REACTION: rash Per pt not sure if from meds or not   Current Outpatient Medications on File Prior to Visit  Medication Sig Dispense Refill  . aspirin 81 MG tablet Take 81 mg by mouth daily.    Marland Kitchen buPROPion (WELLBUTRIN XL) 150 MG 24 hr tablet Take 1 tablet (150 mg total) by mouth daily. 90 tablet 3  . calcium carbonate (TUMS EX) 750 MG chewable tablet Chew 2 tablets by mouth at bedtime.    . Cholecalciferol (VITAMIN D) 1000  UNITS capsule Take 2,000 Units by mouth daily.     Marland Kitchen estradiol (ESTRACE) 0.1 MG/GM vaginal cream Use 2 times a week as directed (about 2 cm in applicator).    . fluticasone (FLONASE) 50 MCG/ACT nasal spray USE 2 SPRAYS IN BOTH  NOSTRILS DAILY 48 g 0  . hydrochlorothiazide (HYDRODIURIL) 50 MG tablet Take 1 tablet (50 mg total) by mouth daily. 90 tablet 3  . levothyroxine (SYNTHROID) 50 MCG tablet TAKE 1 TABLET BY MOUTH  DAILY BEFORE BREAKFAST 90 tablet 3  . losartan (COZAAR) 100 MG tablet TAKE 1 TABLET BY MOUTH  DAILY 90 tablet 1  . pantoprazole (PROTONIX) 40 MG tablet Take 1 tablet (40 mg total) by mouth daily. 90 tablet 3  . sertraline (ZOLOFT) 100 MG tablet Take 1 tablet (100 mg total) by mouth daily. 90 tablet 3  . simvastatin (ZOCOR) 80 MG tablet Take 1 tablet (80 mg total) by mouth daily. 90 tablet 3  . PROAIR HFA 108 (90 Base) MCG/ACT inhaler TAKE 2 PUFFS BY MOUTH EVERY 6 HOURS AS NEEDED FOR WHEEZE OR SHORTNESS OF BREATH (Patient not taking: Reported on 08/23/2020) 8.5 Inhaler 2   No current facility-administered medications on file prior to visit.    Review of Systems  Constitutional: Negative for activity change, appetite change, fatigue, fever and unexpected weight change.  HENT: Negative for congestion, ear pain, rhinorrhea, sinus pressure and sore throat.   Eyes: Negative for pain, redness and visual disturbance.  Respiratory: Negative for cough, shortness of breath and wheezing.   Cardiovascular: Negative for chest pain and palpitations.  Gastrointestinal: Negative for abdominal pain, blood in stool, constipation and diarrhea.  Endocrine: Negative for polydipsia and polyuria.  Genitourinary: Negative for dysuria, frequency and urgency.  Musculoskeletal: Negative for arthralgias, back pain and myalgias.       Rib pain is improved  Skin: Negative for pallor and rash.  Allergic/Immunologic: Negative for environmental allergies.  Neurological: Positive for dizziness. Negative for  tremors, seizures, syncope, facial asymmetry, speech difficulty, weakness, numbness and headaches.  Hematological: Negative for adenopathy. Does not bruise/bleed easily.  Psychiatric/Behavioral: Negative for decreased concentration and dysphoric mood. The patient is not nervous/anxious.        Objective:   Physical Exam Constitutional:      General: She is not in acute distress.    Appearance: Normal appearance. She is well-developed. She is obese. She is not ill-appearing or diaphoretic.  HENT:     Head: Normocephalic and atraumatic.     Right Ear: Tympanic membrane and ear canal normal.     Left Ear:  Tympanic membrane and ear canal normal.     Mouth/Throat:     Mouth: Mucous membranes are moist.  Eyes:     General: No scleral icterus.    Conjunctiva/sclera: Conjunctivae normal.     Pupils: Pupils are equal, round, and reactive to light.     Comments: No nystagmus  Neck:     Thyroid: No thyromegaly.     Vascular: No carotid bruit or JVD.  Cardiovascular:     Rate and Rhythm: Normal rate and regular rhythm.     Heart sounds: Normal heart sounds. No gallop.      Comments: Rate of 81 with EKG Pulmonary:     Effort: Pulmonary effort is normal. No respiratory distress.     Breath sounds: Normal breath sounds. No wheezing or rales.     Comments: Mild R cw soreness/tenderness Abdominal:     General: Bowel sounds are normal. There is no distension or abdominal bruit.     Palpations: Abdomen is soft. There is no mass.     Tenderness: There is no abdominal tenderness.  Musculoskeletal:     Cervical back: Normal range of motion and neck supple. No tenderness.     Right lower leg: No edema.     Left lower leg: No edema.  Lymphadenopathy:     Cervical: No cervical adenopathy.  Skin:    General: Skin is warm and dry.     Coloration: Skin is not pale.     Findings: No erythema or rash.  Neurological:     Mental Status: She is alert.     Cranial Nerves: Cranial nerves are intact.      Sensory: Sensation is intact. No sensory deficit.     Motor: Motor function is intact. No weakness, tremor, abnormal muscle tone or pronator drift.     Coordination: Coordination is intact. Romberg sign negative. Coordination normal.     Deep Tendon Reflexes: Reflexes are normal and symmetric. Reflexes normal.     Comments: Neg rhomberg but pt feels slt dizzy during it   Gait is slow and steady  Psychiatric:        Mood and Affect: Mood normal.           Assessment & Plan:   Problem List Items Addressed This Visit      Cardiovascular and Mediastinum   Essential hypertension    bp is fairly stable  Reviewed home readings Pt worried too high or affecting her balance/causing dizziness No orthostatic changes today BP: 132/78  Will continue hctz 50 mg daily and losartan 100 mg daily and check labs      Relevant Orders   CBC with Differential/Platelet   Comprehensive metabolic panel   EKG 12-Lead (Completed)     Endocrine   Hypothyroidism    Some fatigue (may be situational) TSH today        Musculoskeletal and Integument   Rib fracture    Pain is improved  R side  No longer on hydrocodone  Will check vit D today      Relevant Orders   VITAMIN D 25 Hydroxy (Vit-D Deficiency, Fractures)     Other   Vitamin D deficiency disease    D level today  Recent rib fx and some fatigue      Dizziness - Primary    Positional- after standing, but it takes a minute or so to start  No head trauma or vertigo symptoms  bp is stable and no orthostatic change today or  change in pulse  Labs ordered-pending to mae plan EKG -reassuring with NSR and rate of 81 No neuro change on exam        Relevant Orders   CBC with Differential/Platelet   Comprehensive metabolic panel   TSH   EKG 12-Lead (Completed)   Fatigue    May be sluggish after inactivity following a rib fx Labs today      Relevant Orders   CBC with Differential/Platelet   Iron

## 2020-08-23 NOTE — Assessment & Plan Note (Signed)
D level today  Recent rib fx and some fatigue

## 2020-08-23 NOTE — Assessment & Plan Note (Signed)
bp is fairly stable  Reviewed home readings Pt worried too high or affecting her balance/causing dizziness No orthostatic changes today BP: 132/78  Will continue hctz 50 mg daily and losartan 100 mg daily and check labs

## 2020-08-23 NOTE — Assessment & Plan Note (Signed)
May be sluggish after inactivity following a rib fx Labs today

## 2020-08-23 NOTE — Assessment & Plan Note (Signed)
Positional- after standing, but it takes a minute or so to start  No head trauma or vertigo symptoms  bp is stable and no orthostatic change today or change in pulse  Labs ordered-pending to mae plan EKG -reassuring with NSR and rate of 81 No neuro change on exam

## 2020-08-23 NOTE — Assessment & Plan Note (Signed)
Pain is improved  R side  No longer on hydrocodone  Will check vit D today

## 2020-08-23 NOTE — Patient Instructions (Addendum)
EKG and labs today   Change position slowly  If dizzy - sit or lie down  Keep hydrated   BP is ok   claritin is fine 10 mg daily as needed   If worse or new symptoms let us know

## 2020-08-25 ENCOUNTER — Telehealth: Payer: Self-pay | Admitting: Family Medicine

## 2020-08-25 ENCOUNTER — Telehealth: Payer: Self-pay | Admitting: *Deleted

## 2020-08-25 ENCOUNTER — Encounter: Payer: Self-pay | Admitting: Family Medicine

## 2020-08-25 DIAGNOSIS — R42 Dizziness and giddiness: Secondary | ICD-10-CM

## 2020-08-25 DIAGNOSIS — I10 Essential (primary) hypertension: Secondary | ICD-10-CM

## 2020-08-25 NOTE — Telephone Encounter (Signed)
-----   Message from Judy Pimple, MD sent at 08/23/2020  8:48 PM EDT ----- Mildly low K  Please send in k clor tabs 10 meq 1 po qd # 30 11 ref Re check bmet in 1-2 wk  Otherwise nl labs- reassuring  EKG within nl limits  I would like to refer her to cardiology for further eval of positional dizziness if she is agreeable

## 2020-08-25 NOTE — Telephone Encounter (Signed)
-----   Message from Shon Millet, [place] sent at 08/25/2020  3:38 PM EDT ----- Pt notified of lab results/ EKG results and Dr. Royden Purl comments. Pt had a old Rx of K Dr. Milinda Antis prescribed a while ago so she will take that, didn't want new Rx sent in. F/u lab appt scheduled. Pt agrees with cardiology referral, she would like to see someone in [place], I advise pt PCP will pt referral in and our [facility] will call to schedule appt

## 2020-08-25 NOTE — Telephone Encounter (Signed)
Referral is done

## 2020-09-05 ENCOUNTER — Telehealth: Payer: Self-pay | Admitting: Family Medicine

## 2020-09-05 DIAGNOSIS — E876 Hypokalemia: Secondary | ICD-10-CM

## 2020-09-05 DIAGNOSIS — I10 Essential (primary) hypertension: Secondary | ICD-10-CM

## 2020-09-05 NOTE — Telephone Encounter (Signed)
-----   Message from Aquilla Solian, RT sent at 08/26/2020  1:01 PM EDT ----- Regarding: Lab Orders for Monday 5.16.2022 Please place lab orders for  Monday 5.16.2022, appt notes state "2 week f/u labs" Thank you, Jones Bales RT(R)

## 2020-09-06 ENCOUNTER — Other Ambulatory Visit: Payer: Self-pay

## 2020-09-06 ENCOUNTER — Other Ambulatory Visit (INDEPENDENT_AMBULATORY_CARE_PROVIDER_SITE_OTHER): Payer: Medicare Other

## 2020-09-06 DIAGNOSIS — I10 Essential (primary) hypertension: Secondary | ICD-10-CM | POA: Diagnosis not present

## 2020-09-06 DIAGNOSIS — E876 Hypokalemia: Secondary | ICD-10-CM | POA: Diagnosis not present

## 2020-09-06 DIAGNOSIS — Z1159 Encounter for screening for other viral diseases: Secondary | ICD-10-CM | POA: Diagnosis not present

## 2020-09-06 LAB — BASIC METABOLIC PANEL
BUN: 17 mg/dL (ref 6–23)
CO2: 29 mEq/L (ref 19–32)
Calcium: 10 mg/dL (ref 8.4–10.5)
Chloride: 100 mEq/L (ref 96–112)
Creatinine, Ser: 1 mg/dL (ref 0.40–1.20)
GFR: 57.42 mL/min — ABNORMAL LOW (ref >60.00)
Glucose, Bld: 107 mg/dL — ABNORMAL HIGH (ref 70–99)
Potassium: 3.9 mEq/L (ref 3.5–5.1)
Sodium: 139 mEq/L (ref 135–145)

## 2020-09-07 LAB — HEPATITIS C ANTIBODY
Hepatitis C Ab: NONREACTIVE
SIGNAL TO CUT-OFF: 0.01 (ref <1.00)

## 2020-09-20 NOTE — Progress Notes (Signed)
Cardiology Office Note  Date:  09/21/2020   ID:  NOVI CALIA, DOB 1951-01-20, MRN [id number]  PCP:  Judy Pimple, MD   Chief Complaint  Patient presents with  . New Patient (Initial Visit)    Ref by Dr. Milinda Antis for HTN, lightheaedness and pre-syncope and a history of MVP. Medications reviewed by the patient verbally.     HPI:  Ms. Daniyla Pfahler is a 70 year old woman with past medical history of Hypertension Hx of palpitations in 1980s, "MVP" Smoker 26 years Who presents by referral from Dr. Milinda Antis for dizziness/lightheadedness  Seen by primary care several weeks ago Chronic lightheadedness when standing ("sometimes") Feels like a tightness  Orthostatics negative Blood pressure typically 1 20-1 30 on losartan and HCTZ  Had rib fx in march and has had symptoms of dizziness since then  Fall happened when getting up too fast  No head injury  Saw NP Baity and noted more of a sense of imbalance then spinning and worse orthostatic   Walking from car to door- notes she has to hold on to anything  Unsteady as well  Episodes start after she has been up a few minutes (lasting a minute or so)- woozy No LOC  Has had headaches off and on  No change in diet   Walks driveway once a day Sedentary Unsteady on feet  Home blood pressure: 120-130  Lab work reviewed LDL 58 Total cholesterol 229  EKG personally reviewed by myself on todays visit Shows normal sinus rhythm rate 83 bpm no significant ST-T wave changes  PMH:   has a past medical history of Anxiety, Cataract, GERD (gastroesophageal reflux disease), Hyperlipidemia, Hypertension, Hypothyroidism, and Vitamin D deficiency.  PSH:    Past Surgical History:  Procedure Laterality Date  . CATARACT EXTRACTION W/ INTRAOCULAR LENS IMPLANT Right 12/29/2016   Dr. Mia Creek  . CATARACT EXTRACTION W/ INTRAOCULAR LENS IMPLANT Left 01/15/2017   Dr. Mia Creek  . COLONOSCOPY    . LEEP    . TUBAL LIGATION    . VEIN  LIGATION AND STRIPPING     x2    Current Outpatient Medications  Medication Sig Dispense Refill  . aspirin 81 MG tablet Take 81 mg by mouth daily.    Marland Kitchen buPROPion (WELLBUTRIN XL) 150 MG 24 hr tablet Take 1 tablet (150 mg total) by mouth daily. 90 tablet 3  . calcium carbonate (TUMS EX) 750 MG chewable tablet Chew 2 tablets by mouth at bedtime.    . Cholecalciferol (VITAMIN D) 1000 UNITS capsule Take 2,000 Units by mouth daily.     . fluticasone (FLONASE) 50 MCG/ACT nasal spray USE 2 SPRAYS IN BOTH  NOSTRILS DAILY 48 g 0  . hydrochlorothiazide (HYDRODIURIL) 50 MG tablet Take 1 tablet (50 mg total) by mouth daily. 90 tablet 3  . levothyroxine (SYNTHROID) 50 MCG tablet TAKE 1 TABLET BY MOUTH  DAILY BEFORE BREAKFAST 90 tablet 3  . losartan (COZAAR) 100 MG tablet TAKE 1 TABLET BY MOUTH  DAILY 90 tablet 1  . pantoprazole (PROTONIX) 40 MG tablet Take 1 tablet (40 mg total) by mouth daily. 90 tablet 3  . potassium chloride (KLOR-CON) 10 MEQ tablet Take 10 mEq by mouth daily.    Marland Kitchen PROAIR HFA 108 (90 Base) MCG/ACT inhaler TAKE 2 PUFFS BY MOUTH EVERY 6 HOURS AS NEEDED FOR WHEEZE OR SHORTNESS OF BREATH 8.5 Inhaler 2  . sertraline (ZOLOFT) 100 MG tablet Take 1 tablet (100 mg total) by mouth daily. 90 tablet 3  .  simvastatin (ZOCOR) 80 MG tablet Take 1 tablet (80 mg total) by mouth daily. 90 tablet 3   No current facility-administered medications for this visit.     Allergies:   Amlodipine besylate, Atorvastatin, and Neomycin-bacitracin zn-polymyx   Social History:  The patient  reports that she quit smoking about 24 years ago. She has never used smokeless tobacco. She reports current alcohol use. She reports that she does not use drugs.   Family History:   family history includes Hypertension in her father and mother; Transient ischemic attack in her mother.    Review of Systems: Review of Systems  Constitutional: Negative.   HENT: Negative.   Respiratory: Negative.   Cardiovascular: Negative.    Gastrointestinal: Negative.   Musculoskeletal: Negative.   Neurological: Negative.        Lightheadedness  Psychiatric/Behavioral: Negative.   All other systems reviewed and are negative.   PHYSICAL EXAM: VS:  BP 120/78 (BP Location: Right Arm, Patient Position: Sitting, Cuff Size: Normal)   Pulse 82   Ht 5\' 6"  (1.676 m)   Wt 212 lb 4 oz (96.3 kg)   SpO2 98%   BMI 34.26 kg/m  , BMI Body mass index is 34.26 kg/m. GEN: Well nourished, well developed, in no acute distress HEENT: normal Neck: no JVD, carotid bruits, or masses Cardiac: RRR; no murmurs, rubs, or gallops,no edema  Respiratory:  clear to auscultation bilaterally, normal work of breathing GI: soft, nontender, nondistended, + BS MS: no deformity or atrophy Skin: warm and dry, no rash Neuro:  Strength and sensation are intact Psych: euthymic mood, full affect   Recent Labs: 08/23/2020: ALT 10; Hemoglobin 13.1; Platelets 266.0; TSH 2.21 09/06/2020: BUN 17; Creatinine, Ser 1.00; Potassium 3.9; Sodium 139    Lipid Panel Lab Results  Component Value Date   CHOL 229 (H) 12/09/2019   HDL 58.80 12/09/2019   LDLCALC 103 (H) 11/03/2013   TRIG 291.0 (H) 12/09/2019      Wt Readings from Last 3 Encounters:  09/21/20 212 lb 4 oz (96.3 kg)  08/23/20 210 lb 7 oz (95.5 kg)  07/05/20 212 lb (96.2 kg)      ASSESSMENT AND PLAN:  Problem List Items Addressed This Visit      Cardiology Problems   Essential hypertension     Other   Dizziness - Primary    Other Visit Diagnoses    Weakness         Lightheadedness Atypical in nature, No orthostasis documented today or the primary care I suspect symptoms secondary to general deconditioning, gait instability Minimal exercise, she even admits to weakness Recommend regular walking program, suggest he go to the senior rehab to start a regular exercise program She is at high risk of progressive debility if she does not change course EKG normal, clinical exam normal No  indication of mitral valve disease though she reports 20 years ago having diagnosis of mitral valve prolapse We did offer additional cardiac work-up if needed including echocardiogram or even a monitor for any palpitations No strong indication, she will call 07/07/20 if she would like these ordered  Essential hypertension No medication changes made, recommended close monitoring of blood pressure at home      Total encounter time more than 60 minutes  Greater than 50% was spent in counseling and coordination of care with the patient    Signed, [place], M.D., Ph.D. [facility] [place], [place]

## 2020-09-21 ENCOUNTER — Ambulatory Visit: Payer: Medicare Other | Admitting: Cardiovascular Disease

## 2020-09-21 ENCOUNTER — Other Ambulatory Visit: Payer: Self-pay

## 2020-09-21 ENCOUNTER — Encounter: Payer: Self-pay | Admitting: Cardiovascular Disease

## 2020-09-21 VITALS — BP 120/78 | HR 82 | Ht 66.0 in | Wt 212.2 lb

## 2020-09-21 DIAGNOSIS — I10 Essential (primary) hypertension: Secondary | ICD-10-CM | POA: Diagnosis not present

## 2020-09-21 DIAGNOSIS — R531 Weakness: Secondary | ICD-10-CM | POA: Diagnosis not present

## 2020-09-21 DIAGNOSIS — R42 Dizziness and giddiness: Secondary | ICD-10-CM | POA: Diagnosis not present

## 2020-09-21 NOTE — Patient Instructions (Addendum)
Call if you would like a CT coronary calcium score  Call if you would like an echo  Medication Instructions:  No changes  If you need a refill on your cardiac medications before your next appointment, please call your pharmacy.    Lab work: No new labs needed   If you have labs (blood work) drawn today and your tests are completely normal, you will receive your results only by: Marland Kitchen MyChart Message (if you have MyChart) OR . A paper copy in the mail If you have any lab test that is abnormal or we need to change your treatment, we will call you to review the results.   Testing/Procedures: No new testing needed   Follow-Up: At [facility], you and your health needs are our priority.  As part of our continuing mission to provide you with exceptional heart care, we have created designated Provider Care Teams.  These Care Teams include your primary Cardiologist (physician) and Advanced Practice Providers (APPs -  Physician Assistants and Nurse Practitioners) who all work together to provide you with the care you need, when you need it.  . You will need a follow up appointment as needed  . Providers on your designated Care Team:   . Nicolasa Ducking, NP . Eula Listen, PA-C . Marisue Ivan, PA-C  Any Other Special Instructions Will Be Listed Below (If Applicable).  COVID-19 Vaccine Information can be found at: [email or web address].nl For questions related to vaccine distribution or appointments, please email vaccine@[email or web address] or call [phone].

## 2020-10-19 ENCOUNTER — Other Ambulatory Visit: Payer: Self-pay | Admitting: Family Medicine

## 2020-12-20 ENCOUNTER — Other Ambulatory Visit: Payer: Self-pay | Admitting: Family Medicine

## 2021-01-07 ENCOUNTER — Other Ambulatory Visit: Payer: Self-pay | Admitting: Family Medicine

## 2021-01-25 ENCOUNTER — Encounter: Payer: Self-pay | Admitting: Family Medicine

## 2021-04-07 ENCOUNTER — Other Ambulatory Visit: Payer: Self-pay | Admitting: Family Medicine

## 2021-04-11 MED ORDER — POTASSIUM CHLORIDE ER 10 MEQ PO TBCR: 10.0000 meq | EXTENDED_RELEASE_TABLET | Freq: Every day | ORAL | 1 refills | Status: DC

## 2021-04-13 DIAGNOSIS — Z1231 Encounter for screening mammogram for malignant neoplasm of breast: Secondary | ICD-10-CM | POA: Diagnosis not present

## 2021-04-13 LAB — HM MAMMOGRAPHY

## 2021-04-17 ENCOUNTER — Emergency Department: Payer: Medicare Other

## 2021-04-17 ENCOUNTER — Other Ambulatory Visit: Payer: Self-pay

## 2021-04-17 ENCOUNTER — Encounter: Payer: Self-pay | Admitting: Emergency Medicine

## 2021-04-17 ENCOUNTER — Inpatient Hospital Stay
Admission: EM | Admit: 2021-04-17 | Discharge: 2021-04-25 | DRG: 915 | Disposition: A | Discharge status: Home Health | Payer: Medicare Other | Attending: Internal Medicine | Admitting: Internal Medicine

## 2021-04-17 DIAGNOSIS — F419 Anxiety disorder, unspecified: Secondary | ICD-10-CM | POA: Diagnosis present

## 2021-04-17 DIAGNOSIS — D72829 Elevated white blood cell count, unspecified: Secondary | ICD-10-CM | POA: Diagnosis not present

## 2021-04-17 DIAGNOSIS — Z6835 Body mass index (BMI) 35.0-35.9, adult: Secondary | ICD-10-CM

## 2021-04-17 DIAGNOSIS — I952 Hypotension due to drugs: Secondary | ICD-10-CM | POA: Diagnosis not present

## 2021-04-17 DIAGNOSIS — J9601 Acute respiratory failure with hypoxia: Secondary | ICD-10-CM | POA: Diagnosis present

## 2021-04-17 DIAGNOSIS — Z79899 Other long term (current) drug therapy: Secondary | ICD-10-CM

## 2021-04-17 DIAGNOSIS — R061 Stridor: Secondary | ICD-10-CM | POA: Diagnosis not present

## 2021-04-17 DIAGNOSIS — N179 Acute kidney failure, unspecified: Secondary | ICD-10-CM | POA: Diagnosis not present

## 2021-04-17 DIAGNOSIS — T783XXA Angioneurotic edema, initial encounter: Secondary | ICD-10-CM | POA: Diagnosis not present

## 2021-04-17 DIAGNOSIS — Z7982 Long term (current) use of aspirin: Secondary | ICD-10-CM | POA: Diagnosis not present

## 2021-04-17 DIAGNOSIS — E559 Vitamin D deficiency, unspecified: Secondary | ICD-10-CM | POA: Diagnosis not present

## 2021-04-17 DIAGNOSIS — J9690 Respiratory failure, unspecified, unspecified whether with hypoxia or hypercapnia: Secondary | ICD-10-CM | POA: Diagnosis not present

## 2021-04-17 DIAGNOSIS — T41295A Adverse effect of other general anesthetics, initial encounter: Secondary | ICD-10-CM | POA: Diagnosis not present

## 2021-04-17 DIAGNOSIS — T465X5A Adverse effect of other antihypertensive drugs, initial encounter: Secondary | ICD-10-CM | POA: Diagnosis not present

## 2021-04-17 DIAGNOSIS — R0902 Hypoxemia: Secondary | ICD-10-CM | POA: Diagnosis not present

## 2021-04-17 DIAGNOSIS — Z79890 Hormone replacement therapy (postmenopausal): Secondary | ICD-10-CM | POA: Diagnosis not present

## 2021-04-17 DIAGNOSIS — E039 Hypothyroidism, unspecified: Secondary | ICD-10-CM | POA: Diagnosis not present

## 2021-04-17 DIAGNOSIS — Z87891 Personal history of nicotine dependence: Secondary | ICD-10-CM | POA: Diagnosis not present

## 2021-04-17 DIAGNOSIS — E785 Hyperlipidemia, unspecified: Secondary | ICD-10-CM | POA: Diagnosis not present

## 2021-04-17 DIAGNOSIS — Z8249 Family history of ischemic heart disease and other diseases of the circulatory system: Secondary | ICD-10-CM

## 2021-04-17 DIAGNOSIS — T783XXD Angioneurotic edema, subsequent encounter: Secondary | ICD-10-CM | POA: Diagnosis not present

## 2021-04-17 DIAGNOSIS — K219 Gastro-esophageal reflux disease without esophagitis: Secondary | ICD-10-CM | POA: Diagnosis present

## 2021-04-17 DIAGNOSIS — Z4682 Encounter for fitting and adjustment of non-vascular catheter: Secondary | ICD-10-CM | POA: Diagnosis not present

## 2021-04-17 DIAGNOSIS — Z20822 Contact with and (suspected) exposure to covid-19: Secondary | ICD-10-CM | POA: Diagnosis not present

## 2021-04-17 DIAGNOSIS — E669 Obesity, unspecified: Secondary | ICD-10-CM | POA: Diagnosis present

## 2021-04-17 DIAGNOSIS — R220 Localized swelling, mass and lump, head: Secondary | ICD-10-CM | POA: Diagnosis not present

## 2021-04-17 DIAGNOSIS — R918 Other nonspecific abnormal finding of lung field: Secondary | ICD-10-CM | POA: Diagnosis not present

## 2021-04-17 DIAGNOSIS — I10 Essential (primary) hypertension: Secondary | ICD-10-CM | POA: Diagnosis not present

## 2021-04-17 DIAGNOSIS — R0602 Shortness of breath: Secondary | ICD-10-CM | POA: Diagnosis not present

## 2021-04-17 DIAGNOSIS — I700 Atherosclerosis of aorta: Secondary | ICD-10-CM | POA: Diagnosis not present

## 2021-04-17 LAB — MAGNESIUM: Magnesium: 1.9 mg/dL (ref 1.7–2.4)

## 2021-04-17 LAB — COMPREHENSIVE METABOLIC PANEL
ALT: 11 U/L (ref 0–44)
AST: 20 U/L (ref 15–41)
Albumin: 4.1 g/dL (ref 3.5–5.0)
Alkaline Phosphatase: 67 U/L (ref 38–126)
Anion gap: 6 (ref 5–15)
BUN: 19 mg/dL (ref 8–23)
CO2: 29 mmol/L (ref 22–32)
Calcium: 9.5 mg/dL (ref 8.9–10.3)
Chloride: 100 mmol/L (ref 98–111)
Creatinine, Ser: 1.06 mg/dL — ABNORMAL HIGH (ref 0.44–1.00)
GFR, Estimated: 57 mL/min — ABNORMAL LOW (ref >60)
Glucose, Bld: 108 mg/dL — ABNORMAL HIGH (ref 70–99)
Potassium: 3.6 mmol/L (ref 3.5–5.1)
Sodium: 135 mmol/L (ref 135–145)
Total Bilirubin: 0.7 mg/dL (ref 0.3–1.2)
Total Protein: 7.8 g/dL (ref 6.5–8.1)

## 2021-04-17 LAB — PHOSPHORUS: Phosphorus: 3 mg/dL (ref 2.5–4.6)

## 2021-04-17 LAB — RESP PANEL BY RT-PCR (FLU A&B, COVID) ARPGX2
Influenza A by PCR: NEGATIVE
Influenza B by PCR: NEGATIVE
SARS Coronavirus 2 by RT PCR: NEGATIVE

## 2021-04-17 LAB — GLUCOSE, CAPILLARY: Glucose-Capillary: 157 mg/dL — ABNORMAL HIGH (ref 70–99)

## 2021-04-17 LAB — CBC
HCT: 36.2 % (ref 36.0–46.0)
Hemoglobin: 11.9 g/dL — ABNORMAL LOW (ref 12.0–15.0)
MCH: 32.1 pg (ref 26.0–34.0)
MCHC: 32.9 g/dL (ref 30.0–36.0)
MCV: 97.6 fL (ref 80.0–100.0)
Platelets: 224 10*3/uL (ref 150–400)
RBC: 3.71 MIL/uL — ABNORMAL LOW (ref 3.87–5.11)
RDW: 13.1 % (ref 11.5–15.5)
WBC: 10.6 10*3/uL — ABNORMAL HIGH (ref 4.0–10.5)
nRBC: 0 % (ref 0.0–0.2)

## 2021-04-17 LAB — URINALYSIS, ROUTINE W REFLEX MICROSCOPIC
Bilirubin Urine: NEGATIVE
Glucose, UA: NEGATIVE mg/dL
Hgb urine dipstick: NEGATIVE
Ketones, ur: NEGATIVE mg/dL
Leukocytes,Ua: NEGATIVE
Nitrite: NEGATIVE
Protein, ur: 30 mg/dL — AB
Specific Gravity, Urine: >1.03 — ABNORMAL HIGH (ref 1.005–1.030)
pH: 6 (ref 5.0–8.0)

## 2021-04-17 LAB — BRAIN NATRIURETIC PEPTIDE: B Natriuretic Peptide: 42 pg/mL (ref 0.0–100.0)

## 2021-04-17 LAB — TSH: TSH: 6.255 u[IU]/mL — ABNORMAL HIGH (ref 0.350–4.500)

## 2021-04-17 LAB — URINALYSIS, MICROSCOPIC (REFLEX): Bacteria, UA: NONE SEEN

## 2021-04-17 LAB — CBC WITH DIFFERENTIAL/PLATELET
Abs Immature Granulocytes: 0.03 K/uL (ref 0.00–0.07)
Basophils Absolute: 0 K/uL (ref 0.0–0.1)
Basophils Relative: 1 %
Eosinophils Absolute: 0.3 K/uL (ref 0.0–0.5)
Eosinophils Relative: 4 %
HCT: 39.9 % (ref 36.0–46.0)
Hemoglobin: 13 g/dL (ref 12.0–15.0)
Immature Granulocytes: 0 %
Lymphocytes Relative: 32 %
Lymphs Abs: 2.4 K/uL (ref 0.7–4.0)
MCH: 31.9 pg (ref 26.0–34.0)
MCHC: 32.6 g/dL (ref 30.0–36.0)
MCV: 97.8 fL (ref 80.0–100.0)
Monocytes Absolute: 0.5 K/uL (ref 0.1–1.0)
Monocytes Relative: 7 %
Neutro Abs: 4.2 K/uL (ref 1.7–7.7)
Neutrophils Relative %: 56 %
Platelets: 225 K/uL (ref 150–400)
RBC: 4.08 MIL/uL (ref 3.87–5.11)
RDW: 13.1 % (ref 11.5–15.5)
WBC: 7.6 K/uL (ref 4.0–10.5)
nRBC: 0 % (ref 0.0–0.2)

## 2021-04-17 LAB — CREATININE, SERUM
Creatinine, Ser: 0.99 mg/dL (ref 0.44–1.00)
GFR, Estimated: >60 mL/min (ref >60)

## 2021-04-17 LAB — APTT: aPTT: 28 seconds (ref 24–36)

## 2021-04-17 LAB — PROTIME-INR
INR: 0.9 (ref 0.8–1.2)
Prothrombin Time: 12 seconds (ref 11.4–15.2)

## 2021-04-17 LAB — T4, FREE: Free T4: 1.01 ng/dL (ref 0.61–1.12)

## 2021-04-17 LAB — MRSA NEXT GEN BY PCR, NASAL: MRSA by PCR Next Gen: NOT DETECTED

## 2021-04-17 MED ORDER — ROCURONIUM BROMIDE 10 MG/ML (PF) SYRINGE
PREFILLED_SYRINGE | INTRAVENOUS | Status: AC
  Administered 2021-04-17: 18:00:00 100 mg via INTRAVENOUS
  Filled 2021-04-17: qty 10

## 2021-04-17 MED ORDER — ROCURONIUM BROMIDE 50 MG/5ML IV SOLN
100.0000 mg | Freq: Once | INTRAVENOUS | Status: AC
  Filled 2021-04-17: qty 10

## 2021-04-17 MED ORDER — LEVOTHYROXINE SODIUM 100 MCG PO TABS
100.0000 ug | ORAL_TABLET | Freq: Every day | ORAL | Status: DC
Start: 2021-04-18 — End: 2021-04-18
  Administered 2021-04-18: 06:00:00 100 ug
  Filled 2021-04-17: qty 1

## 2021-04-17 MED ORDER — FENTANYL 2500MCG IN NS 250ML (10MCG/ML) PREMIX INFUSION
0.0000 ug/h | INTRAVENOUS | Status: DC
  Administered 2021-04-17: 19:00:00 25 ug/h via INTRAVENOUS
  Administered 2021-04-18: 20:00:00 200 ug/h via INTRAVENOUS
  Administered 2021-04-19: 175 ug/h via INTRAVENOUS
  Filled 2021-04-17 (×4): qty 250

## 2021-04-17 MED ORDER — ORAL CARE MOUTH RINSE
15.0000 mL | OROMUCOSAL | Status: DC
  Administered 2021-04-17 – 2021-04-20 (×26): 15 mL via OROMUCOSAL

## 2021-04-17 MED ORDER — PROPOFOL 1000 MG/100ML IV EMUL
5.0000 ug/kg/min | INTRAVENOUS | Status: DC
  Administered 2021-04-17: 18:00:00 15 ug/kg/min via INTRAVENOUS

## 2021-04-17 MED ORDER — CHLORHEXIDINE GLUCONATE 0.12% ORAL RINSE (MEDLINE KIT)
15.0000 mL | Freq: Two times a day (BID) | OROMUCOSAL | Status: DC
  Administered 2021-04-17 – 2021-04-23 (×11): 15 mL via OROMUCOSAL

## 2021-04-17 MED ORDER — ALBUTEROL SULFATE (2.5 MG/3ML) 0.083% IN NEBU
2.5000 mg | INHALATION_SOLUTION | Freq: Four times a day (QID) | RESPIRATORY_TRACT | Status: DC
  Administered 2021-04-18 (×3): 2.5 mg via RESPIRATORY_TRACT
  Filled 2021-04-17 (×3): qty 3

## 2021-04-17 MED ORDER — KETAMINE HCL 10 MG/ML IJ SOLN
INTRAMUSCULAR | Status: DC | PRN
  Administered 2021-04-17: 100 mg via INTRAVENOUS

## 2021-04-17 MED ORDER — POLYETHYLENE GLYCOL 3350 17 G PO PACK: 17.0000 g | PACK | Freq: Every day | ORAL | Status: DC | PRN

## 2021-04-17 MED ORDER — ACETAMINOPHEN 325 MG PO TABS: 650.0000 mg | ORAL_TABLET | ORAL | Status: DC | PRN

## 2021-04-17 MED ORDER — DOCUSATE SODIUM 100 MG PO CAPS: 100.0000 mg | ORAL_CAPSULE | Freq: Two times a day (BID) | ORAL | Status: DC | PRN

## 2021-04-17 MED ORDER — ACETAMINOPHEN 325 MG PO TABS
650.0000 mg | ORAL_TABLET | ORAL | Status: DC | PRN
  Administered 2021-04-20: 19:00:00 650 mg
  Filled 2021-04-17: qty 2

## 2021-04-17 MED ORDER — C1 ESTERASE INHIBITOR (HUMAN) 500 UNITS IV KIT
20.0000 [IU]/kg | PACK | Freq: Once | INTRAVENOUS | Status: AC
  Administered 2021-04-17: 18:00:00 1500 [IU] via INTRAVENOUS
  Filled 2021-04-17: qty 1500

## 2021-04-17 MED ORDER — DEXAMETHASONE SODIUM PHOSPHATE 4 MG/ML IJ SOLN
4.0000 mg | Freq: Four times a day (QID) | INTRAMUSCULAR | Status: DC
Start: 2021-04-18 — End: 2021-04-21
  Administered 2021-04-17 – 2021-04-21 (×14): 4 mg via INTRAVENOUS
  Filled 2021-04-17 (×14): qty 1

## 2021-04-17 MED ORDER — SODIUM CHLORIDE 0.9 % IV SOLN: INTRAVENOUS | Status: DC

## 2021-04-17 MED ORDER — LACTATED RINGERS IV SOLN: INTRAVENOUS | Status: DC

## 2021-04-17 MED ORDER — DIPHENHYDRAMINE HCL 50 MG/ML IJ SOLN
12.5000 mg | Freq: Three times a day (TID) | INTRAMUSCULAR | Status: DC
  Administered 2021-04-17 – 2021-04-22 (×15): 12.5 mg via INTRAVENOUS
  Filled 2021-04-17 (×15): qty 1

## 2021-04-17 MED ORDER — PROPOFOL 1000 MG/100ML IV EMUL
INTRAVENOUS | Status: AC
  Filled 2021-04-17: qty 100

## 2021-04-17 MED ORDER — ENOXAPARIN SODIUM 40 MG/0.4ML IJ SOSY
40.0000 mg | PREFILLED_SYRINGE | INTRAMUSCULAR | Status: DC
  Administered 2021-04-17: 22:00:00 40 mg via SUBCUTANEOUS
  Filled 2021-04-17: qty 0.4

## 2021-04-17 MED ORDER — KETAMINE HCL 10 MG/ML IJ SOLN: 400.0000 mg | Freq: Once | INTRAMUSCULAR | Status: AC

## 2021-04-17 MED ORDER — POLYETHYLENE GLYCOL 3350 17 G PO PACK
17.0000 g | PACK | Freq: Every day | ORAL | Status: DC
  Administered 2021-04-17 – 2021-04-21 (×5): 17 g
  Filled 2021-04-17 (×5): qty 1

## 2021-04-17 MED ORDER — KETAMINE HCL 10 MG/ML IJ SOLN
INTRAMUSCULAR | Status: AC
  Administered 2021-04-17: 18:00:00 200 mg via INTRAVENOUS
  Filled 2021-04-17: qty 2

## 2021-04-17 MED ORDER — TRANEXAMIC ACID-NACL 1000-0.7 MG/100ML-% IV SOLN
1000.0000 mg | Freq: Once | INTRAVENOUS | Status: AC
  Administered 2021-04-17: 19:00:00 1000 mg via INTRAVENOUS
  Filled 2021-04-17: qty 100

## 2021-04-17 MED ORDER — PROPOFOL 1000 MG/100ML IV EMUL: 5.0000 ug/kg/min | INTRAVENOUS | Status: DC

## 2021-04-17 MED ORDER — FENTANYL BOLUS VIA INFUSION
50.0000 ug | INTRAVENOUS | Status: DC | PRN
Start: 2021-04-17 — End: 2021-04-20
  Administered 2021-04-17: 21:00:00 100 ug via INTRAVENOUS
  Filled 2021-04-17: qty 100

## 2021-04-17 MED ORDER — LEVOTHYROXINE SODIUM 50 MCG PO TABS: 100.0000 ug | ORAL_TABLET | Freq: Every day | ORAL | Status: DC

## 2021-04-17 MED ORDER — DOCUSATE SODIUM 50 MG/5ML PO LIQD
100.0000 mg | Freq: Two times a day (BID) | ORAL | Status: DC
  Administered 2021-04-17 – 2021-04-21 (×8): 100 mg
  Filled 2021-04-17 (×9): qty 10

## 2021-04-17 MED ORDER — PROPOFOL 1000 MG/100ML IV EMUL
5.0000 ug/kg/min | INTRAVENOUS | Status: DC
  Administered 2021-04-18: 02:00:00 25 ug/kg/min via INTRAVENOUS
  Administered 2021-04-18: 16:00:00 50 ug/kg/min via INTRAVENOUS
  Administered 2021-04-18: 07:00:00 25 ug/kg/min via INTRAVENOUS
  Administered 2021-04-18: 12:00:00 30 ug/kg/min via INTRAVENOUS
  Administered 2021-04-18 – 2021-04-19 (×7): 40 ug/kg/min via INTRAVENOUS
  Administered 2021-04-20 (×2): 35 ug/kg/min via INTRAVENOUS
  Filled 2021-04-17 (×13): qty 100

## 2021-04-17 MED ORDER — FAMOTIDINE IN NACL 20-0.9 MG/50ML-% IV SOLN
20.0000 mg | Freq: Two times a day (BID) | INTRAVENOUS | Status: DC
  Administered 2021-04-17 – 2021-04-22 (×9): 20 mg via INTRAVENOUS
  Filled 2021-04-17 (×11): qty 50

## 2021-04-17 MED ORDER — ONDANSETRON HCL 4 MG/2ML IJ SOLN: 4.0000 mg | Freq: Four times a day (QID) | INTRAMUSCULAR | Status: DC | PRN

## 2021-04-17 MED ORDER — DEXAMETHASONE SODIUM PHOSPHATE 10 MG/ML IJ SOLN
10.0000 mg | Freq: Once | INTRAMUSCULAR | Status: AC
  Administered 2021-04-17: 19:00:00 10 mg via INTRAVENOUS
  Filled 2021-04-17: qty 1

## 2021-04-17 NOTE — ED Notes (Signed)
Wasted 100mg  of Ketamine at this time , RN witnessed this waste.

## 2021-04-17 NOTE — Code Documentation (Addendum)
Unable to place advanced airway at this time. Pt has a difficulty airway. Dr. Jayme Cloud, MD at bedside for assistance. O2 sat decreased to 45%. Pt bagged at this, O2 increased to 99% with BVM.

## 2021-04-17 NOTE — ED Notes (Signed)
ICU NP at bedside.

## 2021-04-17 NOTE — ED Provider Notes (Signed)
[facility] Emergency Department Provider Note ____________________________________________   Event Date/Time   First MD Initiated Contact with Patient 04/17/21 1713     (approximate)  I have reviewed the triage vital signs and the nursing notes.  HISTORY  Chief Complaint Oral Swelling   HPI Sharon [name] is a 70 y.o. femalewho presents to the ED for evaluation of oral swelling.   Chart review indicates hx HTN on an ARB. ASA81 is only thinner. Obese.  Patient presents to the ED, accompanied by her husband, for the evaluation of tongue and oral swelling and associated with difficulty breathing and swallowing.  They were concerned that this was due to her biting her tongue at lunch today.  They report some mild swelling after this but she was okay for a couple hours.  She laid down for a nap and realized that she felt she could not breathe when supine.  She felt increasing swelling to her tongue, the right side of her mouth and the base of her tongue/throat.  She reports this has been worsening over the past 2 hours.  This is never happened before.  Denies pain, bleeding.  Denies any swelling that was present prior to lunch today.  Past Medical History:  Diagnosis Date   Anxiety    Cataract    GERD (gastroesophageal reflux disease)    Hyperlipidemia    Hypertension    Hypothyroidism    Vitamin D deficiency     Patient Active Problem List   Diagnosis Date Noted   Angioedema 04/17/2021   Dizziness 08/23/2020   Fatigue 08/23/2020   Rib fracture 08/23/2020   Elevated fasting glucose 12/17/2019   Encounter for hepatitis C screening test for low risk patient 12/17/2019   Medicare annual wellness visit, subsequent 08/28/2018   Cough 01/28/2018   Welcome to Medicare preventive visit 08/22/2016   Obesity 10/05/2014   Colon cancer screening 03/03/2013   Hypokalemia 10/09/2011   Routine general medical examination at a health care facility 10/02/2011    Vitamin D deficiency disease 12/28/2010   ARTHRALGIA 08/19/2007   Hypothyroidism 01/31/2007   Hyperlipidemia 01/31/2007   ANXIETY 01/31/2007   Essential hypertension 01/31/2007   GERD 01/31/2007    Past Surgical History:  Procedure Laterality Date   CATARACT EXTRACTION W/ INTRAOCULAR LENS IMPLANT Right 12/29/2016   Dr. Mia Creek   CATARACT EXTRACTION W/ INTRAOCULAR LENS IMPLANT Left 01/15/2017   Dr. Mia Creek   COLONOSCOPY     LEEP     TUBAL LIGATION     VEIN LIGATION AND STRIPPING     x2    Prior to Admission medications   Medication Sig Start Date End Date Taking? Authorizing Provider  aspirin 81 MG tablet Take 81 mg by mouth daily.    [provider]  buPROPion (WELLBUTRIN XL) 150 MG 24 hr tablet TAKE 1 TABLET BY MOUTH  DAILY 01/10/21   Tower, Audrie Gallus, MD  calcium carbonate (TUMS EX) 750 MG chewable tablet Chew 2 tablets by mouth at bedtime.    [provider]  Cholecalciferol (VITAMIN D) 1000 UNITS capsule Take 2,000 Units by mouth daily.     [provider]  fluticasone (FLONASE) 50 MCG/ACT nasal spray USE 2 SPRAYS IN BOTH  NOSTRILS DAILY 10/19/20   Tower, Audrie Gallus, MD  hydrochlorothiazide (HYDRODIURIL) 50 MG tablet TAKE 1 TABLET BY MOUTH  DAILY 01/10/21   Tower, Audrie Gallus, MD  levothyroxine (SYNTHROID) 50 MCG tablet TAKE 1 TABLET BY MOUTH  DAILY  BEFORE BREAKFAST 01/10/21   Tower, Audrie Gallus, MD  losartan (COZAAR) 100 MG tablet TAKE 1 TABLET BY MOUTH  DAILY 12/20/20   Tower, Audrie Gallus, MD  pantoprazole (PROTONIX) 40 MG tablet TAKE 1 TABLET BY MOUTH  DAILY 01/10/21   Tower, Audrie Gallus, MD  potassium chloride (KLOR-CON) 10 MEQ tablet Take 1 tablet (10 mEq total) by mouth daily. 04/11/21   Tower, Audrie Gallus, MD  PROAIR HFA 108 [phone] Base) MCG/ACT inhaler TAKE 2 PUFFS BY MOUTH EVERY 6 HOURS AS NEEDED FOR WHEEZE OR SHORTNESS OF BREATH 02/20/18   Tower, Audrie Gallus, MD  sertraline (ZOLOFT) 100 MG tablet TAKE 1 TABLET BY MOUTH  DAILY 01/10/21   Tower, Audrie Gallus, MD   simvastatin (ZOCOR) 80 MG tablet TAKE 1 TABLET BY MOUTH  DAILY 01/10/21   Tower, Audrie Gallus, MD    Allergies Amlodipine besylate, Atorvastatin, and Neomycin-bacitracin zn-polymyx  Family History  Problem Relation Age of Onset   Transient ischemic attack Mother    Hypertension Mother    Hypertension Father    Colon cancer Neg Hx    Esophageal cancer Neg Hx    Rectal cancer Neg Hx    Stomach cancer Neg Hx     Social History Social History   Tobacco Use   Smoking status: Former    Types: Cigarettes    Quit date: 04/24/1996    Years since quitting: 24.9   Smokeless tobacco: Never  Vaping Use   Vaping Use: Never used  Substance Use Topics   Alcohol use: Yes    Alcohol/week: 0.0 standard drinks    Comment: occ. (maybe every 3 mos.)   Drug use: No    Review of Systems  Constitutional: No fever/chills Eyes: No visual changes. ENT: Positive for oropharyngeal swelling Cardiovascular: Denies chest pain. Respiratory: Denies shortness of breath. Gastrointestinal: No abdominal pain.  No nausea, no vomiting.  No diarrhea.  No constipation. Genitourinary: Negative for dysuria. Musculoskeletal: Negative for back pain. Skin: Negative for rash. Neurological: Negative for headaches, focal weakness or numbness.  ____________________________________________   PHYSICAL EXAM:  VITAL SIGNS: Vitals:   04/17/21 1800 04/17/21 1812  BP:    Pulse:    Resp:    Temp:  (!) 96 F (35.6 C)  SpO2: 92%     Constitutional: Alert and oriented.  Sitting upright in bed and well-appearing, obese.  She is jovial initially.  Voice is somewhat hoarse and she seems to struggle to swallow, but fine when she is not trying to speak or swallow.  This seems to be increasingly worsen throughout my few minutes of initial evaluation Eyes: Conjunctivae are normal. PERRL. EOMI. Head: Atraumatic. Has some minor asymmetry and swelling to her eyelid on the right Nose: No congestion/rhinnorhea. Mouth/Throat:  Mucous membranes are moist.   Swelling of the tongue is asymmetric, right worse than left.  Floor of the mouth is swollen.  I am able to barely push her tongue towards the right and visualize a posterior oropharynx that is nonerythematous.  Uvula is midline and uvula is small. No significant trauma or bleeding noted to the tongue. Neck: No stridor. No cervical spine tenderness to palpation. Cardiovascular: Normal rate, regular rhythm. Good peripheral circulation. Respiratory: Normal respiratory effort.  No retractions. Lungs CTAB. Gastrointestinal: Soft , nondistended, nontender to palpation.  Musculoskeletal: No joint effusions. No signs of acute trauma. Neurologic:   No gross focal neurologic deficits are appreciated.  Skin:  Skin is warm, dry and intact. No rash noted. Psychiatric: Mood and  affect are normal. Speech and behavior are normal. ____________________________________________   LABS (all labs ordered are listed, but only abnormal results are displayed)  Labs Reviewed  COMPREHENSIVE METABOLIC PANEL - Abnormal; Notable for the following components:      Result Value   Glucose, Bld 108 (*)    Creatinine, Ser 1.06 (*)    GFR, Estimated 57 (*)    All other components within normal limits  BLOOD GAS, ARTERIAL - Abnormal; Notable for the following components:   pO2, Arterial 130 (*)    All other components within normal limits  RESP PANEL BY RT-PCR (FLU A&B, COVID) ARPGX2  PROTIME-INR  APTT  CBC WITH DIFFERENTIAL/PLATELET  HIV ANTIBODY (ROUTINE TESTING W REFLEX)  CBC  CREATININE, SERUM  BRAIN NATRIURETIC PEPTIDE  URINALYSIS, ROUTINE W REFLEX MICROSCOPIC  BASIC METABOLIC PANEL  MAGNESIUM  PHOSPHORUS  CBC  C1 ESTERASE INHIBITOR  C4 COMPLEMENT  C3 COMPLEMENT  IGE  TSH  T4, FREE   ____________________________________________  12 Lead EKG  Sinus rhythm, rate of 87 bpm.  Normal axis and intervals.  Early R wave progression.  No evidence of  ischemia ____________________________________________  RADIOLOGY  ED MD interpretation: 1 view CXR reviewed by me with ETT in good position  Official radiology report(s): No results found.  ____________________________________________   PROCEDURES and INTERVENTIONS  Procedure(s) performed (including Critical Care):  .1-3 Lead EKG Interpretation Performed by: Delton Prairie, MD Authorized by: Delton Prairie, MD     Interpretation: normal     ECG rate:  90   ECG rate assessment: normal     Rhythm: sinus rhythm     Ectopy: none     Conduction: normal   .Critical Care Performed by: Delton Prairie, MD Authorized by: Delton Prairie, MD   Critical care provider statement:    Critical care time (minutes):  30   Critical care time was exclusive of:  Separately billable procedures and treating other patients   Critical care was necessary to treat or prevent imminent or life-threatening deterioration of the following conditions:  Respiratory failure   Critical care was time spent personally by me on the following activities:  Development of treatment plan with patient or surrogate, discussions with consultants, evaluation of patient's response to treatment, examination of patient, ordering and review of laboratory studies, ordering and review of radiographic studies, ordering and performing treatments and interventions, pulse oximetry, re-evaluation of patient's condition and review of old charts Procedure Name: Awake intubation Date/Time: 04/17/2021 6:32 PM Performed by: Delton Prairie, MD Pre-anesthesia Checklist: Patient identified, Suction available, Emergency Drugs available, Timeout performed and Patient being monitored Induction Type: IV induction Ventilation: Mask ventilation without difficulty, Two handed mask ventilation required and Nasal airway inserted- appropriate to patient size Laryngoscope Size: Glidescope and 3 Tube size: 7.0 mm Number of attempts: 3 Airway Equipment and  Method: Rigid stylet and Bougie stylet Placement Confirmation: ETT inserted through vocal cords under direct vision, Positive ETCO2 and Breath sounds checked- equal and bilateral Difficulty Due To: Difficult Airway-  due to edematous airway     Medications  tranexamic acid (CYKLOKAPRON) IVPB 1,000 mg (has no administration in time range)  ketamine (KETALAR) injection (100 mg Intravenous Given 04/17/21 1753)  0.9 %  sodium chloride infusion (has no administration in time range)  C1 esterase inhibitor (Human) (BERINERT) injection 1,500 Units (has no administration in time range)  propofol (DIPRIVAN) 1000 MG/100ML infusion (15 mcg/kg/min  81.6 kg Intravenous New Bag/Given 04/17/21 1804)  dexamethasone (DECADRON) injection 10 mg (has no administration  in time range)  fentaNYL in NS (105mcg/ml) infusion-PREMIX (has no administration in time range)  docusate sodium (COLACE) capsule 100 mg (has no administration in time range)  polyethylene glycol (MIRALAX / GLYCOLAX) packet 17 g (has no administration in time range)  enoxaparin (LOVENOX) injection 40 mg (has no administration in time range)  famotidine (PEPCID) IVPB 20 mg premix (has no administration in time range)  lactated ringers infusion (has no administration in time range)  acetaminophen (TYLENOL) tablet 650 mg (has no administration in time range)  ondansetron (ZOFRAN) injection 4 mg (has no administration in time range)  albuterol (PROVENTIL) (2.5 MG/3ML) 0.083% nebulizer solution 2.5 mg (has no administration in time range)  levothyroxine (SYNTHROID) tablet 100 mcg (has no administration in time range)  dexamethasone (DECADRON) injection 4 mg (has no administration in time range)  diphenhydrAMINE (BENADRYL) injection 12.5 mg (has no administration in time range)  ketamine (KETALAR) injection 400 mg (200 mg Intravenous Given 04/17/21 1746)  rocuronium (ZEMURON) injection 100 mg (100 mg Intravenous Given 04/17/21 1757)     ____________________________________________   MDM / ED COURSE   70 year old female presents to the ED with rapidly worsening severe tongue swelling, likely angioedema and etiology, ultimately requiring intubation for airway protection.  She presents after biting her tongue at lunch today, but I doubt traumatic pathology or hematoma as etiology of this.  She is not on blood thinners and clinically does not look like this.  Considering the swelling of the floor of her mouth, some mild swelling to her eyelids and face otherwise, anticipate that is a red herring and this is angioedema due to her being on ARB, or some other allergic reaction.  I discussed with patient and husband at the outset of my concerns and my recommendation for elective intubation to maintain her airway and they are agreeable with this after discussing risks and benefits.  We discussed risks of difficult airway, possible need for cricothyrotomy, possible hypoxia .   Delayed sequence intubation performed with the assistance of Dr. Jayme Cloud from the ICU.  Utilized 300 mg of IV ketamine and was able to get a look at the cords.  Required about 3 attempts due to desaturations.  Able to bag effectively each time.    We will provide steroids, C1 esterase inhibitor and admit to the ICU for further work-up and management.  Clinical Course as of 04/17/21 1610  Wynelle Link Apr 17, 2021  1805 Intubated on the 3rd try with the assistance of Dr. Jayme Cloud from the ICU. On each subsequent look, she was becoming increasingly swollen [DS]    Clinical Course User Index [DS] Delton Prairie, MD    ____________________________________________   FINAL CLINICAL IMPRESSION(S) / ED DIAGNOSES  Final diagnoses:  Severe tongue swelling  Angioedema, initial encounter     ED Discharge Orders     None        Bernadette Armijo Katrinka Blazing   Note:  This document was prepared using Dragon voice recognition software and may include unintentional dictation  errors.    Delton Prairie, MD 04/17/21 [phone]

## 2021-04-17 NOTE — ED Notes (Signed)
Urine, lavender, purple, SST and SO tubes sent to lab.

## 2021-04-17 NOTE — Progress Notes (Signed)
eLink Physician-Brief Progress Note Patient Name: Sharon [name] DOB: [birth year] MRN: [id number]   Date of Service  04/17/2021  HPI/Events of Note  73 F history of hypertension, dyslipidemia, hypothyroidism was having dinner with family when she bit her tongue and developed swelling of her tongue. Progressively got worse once at home with swelling of face as well. Intubated in the ED for airway protection.  eICU Interventions  Angioedema unclear etiology. Receiving C1 esterase inhibitor as well as dexamethasone, diphenhydramine, famotidine     Intervention Category Evaluation Type: New Patient Evaluation  Darl Pikes 04/17/2021, 9:32 PM

## 2021-04-17 NOTE — Code Documentation (Signed)
Pt suctioned at this time.

## 2021-04-17 NOTE — ED Triage Notes (Signed)
Pt via POV from home. Pt states she bite her tongue around 1:00pm and since then she has had tongue swelling. States it is difficult to swallow but denies SOB. But after walking pt back to room some dyspnea noted. Swelling has progressively gotten worse.

## 2021-04-17 NOTE — Procedures (Signed)
Endotracheal Intubation: Patient required placement of an artificial airway secondary to upper airway obstruction/stridor from severe angioedema.  Consent: Emergent.   Hand washing performed prior to starting the procedure.   Medications administered for sedation prior to procedure:  Ketamine and rocuronium, see ED record   I was asked to assist with difficult intubation in the ED.  Patient angioedema and stridor from the same.  Emergency room physician Dr. Delton Prairie attempted intubation after administering ketamine for the patient.  Patient's vocal folds were extremely edematous and visualization was poor due to the patient's body habitus, swollen tongue and copious secretions.  Patient very anterior.  At this point I took over the procedure, made 1 attempt for intubation however due to copious secretions and poor visualization this failed.  Patient was bagged with 100% and received more ketamine and required paralytic agent (rocuronium) and once saturations were up to 100%, a 6.5 ET tube was then placed utilizing glide a scope with #4 blade.  E-Z-Cap showed color change.    For exact doses of the medications please refer to the ED record   The artificial airway was placed under direct visualization via glidescope route using a 6.5 ETT on the second attempt.  ETT was secured at 26 cm @ lip[  Placement was confirmed by auscuitation of lungs with good breath sounds bilaterally and no epigastric sounds.  Condensation was noted on endotracheal tube.   Pulse ox 98%.  CO2 detector in place with appropriate color change.   Complications: None .   Operator: Jayme Cloud  Chest radiograph shows ET tube at 2.8 cm above the carina.  Gailen Shelter, MD Advanced Bronchoscopy PCC[facility]    *This note was dictated using voice recognition software/Dragon.  Despite best efforts to proofread, errors can occur which can change the meaning. Any transcriptional errors that  result from this process are unintentional and may not be fully corrected at the time of dictation.

## 2021-04-17 NOTE — H&P (Signed)
NAME:  Sharon [name], MRN:  [id number], DOB:  [birth year], LOS: 0 ADMISSION DATE:  04/17/2021, CONSULTATION DATE: 04/17/2021 REFERRING MD: Dr. Adaline Sill, CHIEF COMPLAINT: Oral swelling  History of Present Illness:  70 year old female presenting to [facility] ED on 04/17/2021 from home with her husband.  (History obtained from husband who is bedside and ED documentation) Patient was at her son's for holiday dinner eating some ham and mashed potatoes, per husband there was nothing unusual at dinner.  She reported biting her tongue, with mild swelling. Once back at the house, she laid down for a nap and realized she was having trouble breathing when supine & they both noticed the swelling in her tongue was worsening, now including the right side of her face.  They became concerned, and drove to the ED for evaluation.  Patient's husband denies anything like this happening before in their 32 years of marriage.  He is not sure but does not believe his son cooks with any unusual type of oil, i.e. peanut oil.  Of note, upon chart review, in the patient's medication list she is prescribed losartan.  Husband denies anything out of the ordinary in the week prior, denying that his wife complained of any pain/bleeding/shortness of breath/fever or chills. ED course: EDP noted swelling of the floor of her mouth, mild swelling to her eyelids and face.  Due to concern for airway protection the patient and her husband were consented for urgent rapid sequence intubation and mechanical ventilatory support.  Dr. Jayme Cloud assisted in the mechanical intubation. Medications given: C1 esterase, Decadron, ketamine, rocuronium, tranexamic acid Initial Vitals: Afebrile 98.6, RR 20, tachycardic at 138, hypertensive at 186/114 and SPO2 92% on RA Significant labs: (Labs/ Imaging personally reviewed) I, Cheryll Cockayne Rust-Chester, AGACNP-BC, personally viewed and interpreted this ECG. EKG Interpretation: Date: 04/17/21, EKG Time: 17:23,  Rate: 87, Rhythm: NSR, QRS Axis:  normal, Intervals: normal, ST/T Wave abnormalities: none, Narrative Interpretation: NSR Chemistry: Initial BMP WNL except for Cr which was slightly elevated at 1.06 and has since corrected to 0.99. Hematology: WBC: 7.6 > 10.6, Hgb: 13> 11.9,  COVID-19 & Influenza A/B: negative ABG: 7.41/ 36/130/22.8  All other labs WNL CXR 04/17/21: Patchy perihilar opacity  PCCM consulted for admission.  Pertinent  Medical History  Mitral valve prolapse (1980s) Hyperlipidemia Hypertension Hypothyroidism Remote smoking history (26 years) Vitamin D deficiency Significant Hospital Events: Including procedures, antibiotic start and stop dates in addition to other pertinent events   04/17/2021: Admit to ICU due to angioedema requiring urgent RSI and mechanical ventilatory support  Interim History / Subjective:  Patient recently intubated on mechanical ventilatory support with sedation running and husband bedside.  Objective   Blood pressure 110/86, pulse 98, temperature 98.9 F (37.2 C), resp. rate 18, height 5\' 6"  (1.676 m), weight 81.6 kg, SpO2 100 %.    Vent Mode: AC FiO2 (%):  [100 %] 100 % Set Rate:  [16 bmp] 16 bmp Vt Set:  [480 mL] 480 mL PEEP:  [8 cmH20] 8 cmH20   Intake/Output Summary (Last 24 hours) at 04/17/2021 2016 Last data filed at 04/17/2021 1944 Gross per 24 hour  Intake --  Output 30 ml  Net -30 ml   Filed Weights   04/17/21 1731  Weight: 81.6 kg    Examination: General: Adult female, critically ill, lying in bed intubated & sedated requiring mechanical ventilation, NAD HEENT: MM pink/moist, anicteric, atraumatic, neck supple Neuro: Sedated, unable to follow commands, PERRL +3 (still sedated from intubation medications) CV:  s1s2 RRR, NSR-ST on monitor, no r/m/g Pulm: Regular, non labored on AC with 80% FiO2 and PEEP of 8, breath sounds rhonchi-BUL & diminished-BLL GI: soft, rounded, bs x 4 GU: foley in place with clear yellow  urine Skin: no rashes/lesions noted Extremities: warm/dry, pulses + 2 R/P, +3 edema noted bilateral ankles  Resolved Hospital Problem list     Assessment & Plan:  Acute Respiratory Failure secondary to airway compromise from angioedema in the setting of suspected allergic reaction from ARB PMHx: remote smoking history C1 esterase inhibitor given, 10 mg decadron - Ventilator settings: PRVC  8 mL/kg, 80% FiO2, 8 PEEP, continue ventilator support & lung protective strategies - Wean PEEP & FiO2 as tolerated, maintain SpO2 > 90% - Head of bed elevated 30 degrees, VAP protocol in place - Plateau pressures less than 30 cm H20  - Intermittent chest x-ray & ABG PRN - Daily WUA with SBT as tolerated  - Ensure adequate pulmonary hygiene  - Steroids initiated: decadron 4 mg Q 6 h - famotidine IV, benadryl Q 8 h - Bronchodilators Q 6 - daily cuff leak assessment - PAD protocol in place: continue Fentanyl drip & Propofol drip  Hypothyroidism - continue outpatient levothyroxine regimen  Hypertension - outpatient losartan on hold due to sedation & suspected allergic reaction, added to allergy list, will need alternative agent once extubated  Hyperlipidemia - consider restarting home simvastatin as patient stabilizes  Anxiety - continue PAD protocol with fentanyl & propofol - outpatient regimen on hold, consider restarting bupropion & sertraline as patient stabilizes  Best Practice (right click and "Reselect all SmartList Selections" daily)  Diet/type: NPO w/ meds via tube DVT prophylaxis: LMWH GI prophylaxis: H2B Lines: N/A Foley:  Yes, and it is still needed Code Status:  full code Last date of multidisciplinary goals of care discussion [04/17/21]  Labs   CBC: Recent Labs  Lab 04/17/21 1728 04/17/21 1928  WBC 7.6 10.6*  NEUTROABS 4.2  --   HGB 13.0 11.9*  HCT 39.9 36.2  MCV 97.8 97.6  PLT 225 224    Basic Metabolic Panel: Recent Labs  Lab 04/17/21 1728 04/17/21 1928   NA 135  --   K 3.6  --   CL 100  --   CO2 29  --   GLUCOSE 108*  --   BUN 19  --   CREATININE 1.06* 0.99  CALCIUM 9.5  --    GFR: Estimated Creatinine Clearance: 56.9 mL/min (by C-G formula based on SCr of 0.99 mg/dL). Recent Labs  Lab 04/17/21 1728 04/17/21 1928  WBC 7.6 10.6*    Liver Function Tests: Recent Labs  Lab 04/17/21 1728  AST 20  ALT 11  ALKPHOS 67  BILITOT 0.7  PROT 7.8  ALBUMIN 4.1   No results for input(s): LIPASE, AMYLASE in the last 168 hours. No results for input(s): AMMONIA in the last 168 hours.  ABG    Component Value Date/Time   PHART 7.41 04/17/2021 1817   PCO2ART 36 04/17/2021 1817   PO2ART 130 (H) 04/17/2021 1817   HCO3 22.8 04/17/2021 1817   ACIDBASEDEF 1.4 04/17/2021 1817   O2SAT 99.0 04/17/2021 1817     Coagulation Profile: Recent Labs  Lab 04/17/21 1728  INR 0.9    Cardiac Enzymes: No results for input(s): CKTOTAL, CKMB, CKMBINDEX, TROPONINI in the last 168 hours.  HbA1C: No results found for: HGBA1C  CBG: No results for input(s): GLUCAP in the last 168 hours.  Review of Systems:  UTA- patient intubated requiring mechanical ventilatory support.  Past Medical History:  She,  has a past medical history of Anxiety, Cataract, GERD (gastroesophageal reflux disease), Hyperlipidemia, Hypertension, Hypothyroidism, and Vitamin D deficiency.   Surgical History:   Past Surgical History:  Procedure Laterality Date   CATARACT EXTRACTION W/ INTRAOCULAR LENS IMPLANT Right 12/29/2016   Dr. Mia Creek   CATARACT EXTRACTION W/ INTRAOCULAR LENS IMPLANT Left 01/15/2017   Dr. Mia Creek   COLONOSCOPY     LEEP     TUBAL LIGATION     VEIN LIGATION AND STRIPPING     x2     Social History:   reports that she quit smoking about 24 years ago. She has never used smokeless tobacco. She reports current alcohol use. She reports that she does not use drugs.   Family History:  Her family history includes Hypertension in her  father and mother; Transient ischemic attack in her mother. There is no history of Colon cancer, Esophageal cancer, Rectal cancer, or Stomach cancer.   Allergies Allergies  Allergen Reactions   Amlodipine Besylate     REACTION: edema   Atorvastatin     REACTION: muscle pain   Neomycin-Bacitracin Zn-Polymyx     REACTION: rash Per pt not sure if from meds or not     Home Medications  Prior to Admission medications   Medication Sig Start Date End Date Taking? Authorizing Provider  aspirin 81 MG tablet Take 81 mg by mouth daily.    [provider]  buPROPion (WELLBUTRIN XL) 150 MG 24 hr tablet TAKE 1 TABLET BY MOUTH  DAILY 01/10/21   Tower, Audrie Gallus, MD  calcium carbonate (TUMS EX) 750 MG chewable tablet Chew 2 tablets by mouth at bedtime.    [provider]  Cholecalciferol (VITAMIN D) 1000 UNITS capsule Take 2,000 Units by mouth daily.     [provider]  fluticasone (FLONASE) 50 MCG/ACT nasal spray USE 2 SPRAYS IN BOTH  NOSTRILS DAILY 10/19/20   Tower, Audrie Gallus, MD  hydrochlorothiazide (HYDRODIURIL) 50 MG tablet TAKE 1 TABLET BY MOUTH  DAILY 01/10/21   Tower, Audrie Gallus, MD  levothyroxine (SYNTHROID) 50 MCG tablet TAKE 1 TABLET BY MOUTH  DAILY BEFORE BREAKFAST 01/10/21   Tower, Audrie Gallus, MD  losartan (COZAAR) 100 MG tablet TAKE 1 TABLET BY MOUTH  DAILY 12/20/20   Tower, Audrie Gallus, MD  pantoprazole (PROTONIX) 40 MG tablet TAKE 1 TABLET BY MOUTH  DAILY 01/10/21   Tower, Audrie Gallus, MD  potassium chloride (KLOR-CON) 10 MEQ tablet Take 1 tablet (10 mEq total) by mouth daily. 04/11/21   Tower, Audrie Gallus, MD  PROAIR HFA 108 [phone] Base) MCG/ACT inhaler TAKE 2 PUFFS BY MOUTH EVERY 6 HOURS AS NEEDED FOR WHEEZE OR SHORTNESS OF BREATH 02/20/18   Tower, Audrie Gallus, MD  sertraline (ZOLOFT) 100 MG tablet TAKE 1 TABLET BY MOUTH  DAILY 01/10/21   Tower, Audrie Gallus, MD  simvastatin (ZOCOR) 80 MG tablet TAKE 1 TABLET BY MOUTH  DAILY 01/10/21   Tower, Audrie Gallus, MD     Critical care time: 60 minutes        Betsey Holiday, AGACNP-BC Acute Care Nurse Practitioner [facility]   [phone] / [phone] Please see Amion for pager details.

## 2021-04-18 ENCOUNTER — Inpatient Hospital Stay: Payer: Medicare Other

## 2021-04-18 DIAGNOSIS — T783XXD Angioneurotic edema, subsequent encounter: Secondary | ICD-10-CM

## 2021-04-18 LAB — BASIC METABOLIC PANEL
Anion gap: 9 (ref 5–15)
BUN: 17 mg/dL (ref 8–23)
CO2: 20 mmol/L — ABNORMAL LOW (ref 22–32)
Calcium: 8.7 mg/dL — ABNORMAL LOW (ref 8.9–10.3)
Chloride: 105 mmol/L (ref 98–111)
Creatinine, Ser: 1.04 mg/dL — ABNORMAL HIGH (ref 0.44–1.00)
GFR, Estimated: 58 mL/min — ABNORMAL LOW (ref >60)
Glucose, Bld: 150 mg/dL — ABNORMAL HIGH (ref 70–99)
Potassium: 4.1 mmol/L (ref 3.5–5.1)
Sodium: 134 mmol/L — ABNORMAL LOW (ref 135–145)

## 2021-04-18 LAB — TRIGLYCERIDES: Triglycerides: 210 mg/dL — ABNORMAL HIGH (ref <150)

## 2021-04-18 LAB — CBC
HCT: 35.2 % — ABNORMAL LOW (ref 36.0–46.0)
Hemoglobin: 11.6 g/dL — ABNORMAL LOW (ref 12.0–15.0)
MCH: 32 pg (ref 26.0–34.0)
MCHC: 33 g/dL (ref 30.0–36.0)
MCV: 97 fL (ref 80.0–100.0)
Platelets: 265 10*3/uL (ref 150–400)
RBC: 3.63 MIL/uL — ABNORMAL LOW (ref 3.87–5.11)
RDW: 13.2 % (ref 11.5–15.5)
WBC: 10.3 10*3/uL (ref 4.0–10.5)
nRBC: 0 % (ref 0.0–0.2)

## 2021-04-18 LAB — GLUCOSE, CAPILLARY
Glucose-Capillary: 142 mg/dL — ABNORMAL HIGH (ref 70–99)
Glucose-Capillary: 149 mg/dL — ABNORMAL HIGH (ref 70–99)
Glucose-Capillary: 152 mg/dL — ABNORMAL HIGH (ref 70–99)
Glucose-Capillary: 153 mg/dL — ABNORMAL HIGH (ref 70–99)
Glucose-Capillary: 158 mg/dL — ABNORMAL HIGH (ref 70–99)
Glucose-Capillary: 158 mg/dL — ABNORMAL HIGH (ref 70–99)
Glucose-Capillary: 165 mg/dL — ABNORMAL HIGH (ref 70–99)

## 2021-04-18 LAB — MAGNESIUM: Magnesium: 2.4 mg/dL (ref 1.7–2.4)

## 2021-04-18 LAB — PHOSPHORUS: Phosphorus: 4.1 mg/dL (ref 2.5–4.6)

## 2021-04-18 LAB — HIV ANTIBODY (ROUTINE TESTING W REFLEX): HIV Screen 4th Generation wRfx: NONREACTIVE

## 2021-04-18 MED ORDER — LEVOTHYROXINE SODIUM 112 MCG PO TABS
112.0000 ug | ORAL_TABLET | Freq: Every day | ORAL | Status: DC
Start: 2021-04-19 — End: 2021-04-18

## 2021-04-18 MED ORDER — BUDESONIDE 0.25 MG/2ML IN SUSP
0.2500 mg | Freq: Two times a day (BID) | RESPIRATORY_TRACT | Status: DC
  Administered 2021-04-18 – 2021-04-23 (×11): 0.25 mg via RESPIRATORY_TRACT
  Filled 2021-04-18 (×13): qty 2

## 2021-04-18 MED ORDER — VITAL HIGH PROTEIN PO LIQD
1000.0000 mL | ORAL | Status: DC
  Administered 2021-04-18: 12:00:00 1000 mL

## 2021-04-18 MED ORDER — LACTATED RINGERS IV BOLUS
500.0000 mL | Freq: Once | INTRAVENOUS | Status: AC
  Administered 2021-04-18: 03:00:00 500 mL via INTRAVENOUS

## 2021-04-18 MED ORDER — LEVOTHYROXINE SODIUM 50 MCG PO TABS
75.0000 ug | ORAL_TABLET | Freq: Every day | ORAL | Status: DC
  Administered 2021-04-19 – 2021-04-25 (×7): 75 ug
  Filled 2021-04-18: qty 1
  Filled 2021-04-18: qty 2
  Filled 2021-04-18: qty 1
  Filled 2021-04-18: qty 2
  Filled 2021-04-18 (×2): qty 1
  Filled 2021-04-18: qty 2

## 2021-04-18 MED ORDER — ALBUTEROL SULFATE (2.5 MG/3ML) 0.083% IN NEBU
2.5000 mg | INHALATION_SOLUTION | RESPIRATORY_TRACT | Status: DC
  Administered 2021-04-18 – 2021-04-21 (×17): 2.5 mg via RESPIRATORY_TRACT
  Filled 2021-04-18 (×17): qty 3

## 2021-04-18 MED ORDER — FREE WATER
30.0000 mL | Status: DC
  Administered 2021-04-18 – 2021-04-20 (×12): 30 mL

## 2021-04-18 MED ORDER — SODIUM CHLORIDE 0.9 % IV SOLN
250.0000 mL | INTRAVENOUS | Status: DC
  Administered 2021-04-20: 17:00:00 250 mL via INTRAVENOUS

## 2021-04-18 MED ORDER — FUROSEMIDE 10 MG/ML IJ SOLN
20.0000 mg | Freq: Once | INTRAMUSCULAR | Status: AC
  Administered 2021-04-18: 19:00:00 20 mg via INTRAVENOUS
  Filled 2021-04-18: qty 2

## 2021-04-18 MED ORDER — CHLORHEXIDINE GLUCONATE CLOTH 2 % EX PADS
6.0000 | MEDICATED_PAD | Freq: Every day | CUTANEOUS | Status: DC
  Administered 2021-04-18 – 2021-04-23 (×4): 6 via TOPICAL

## 2021-04-18 MED ORDER — ENOXAPARIN SODIUM 60 MG/0.6ML IJ SOSY
0.5000 mg/kg | PREFILLED_SYRINGE | INTRAMUSCULAR | Status: DC
  Administered 2021-04-18 – 2021-04-24 (×7): 50 mg via SUBCUTANEOUS
  Filled 2021-04-18 (×2): qty 0.6
  Filled 2021-04-18 (×2): qty 0.5
  Filled 2021-04-18: qty 0.6
  Filled 2021-04-18: qty 0.5
  Filled 2021-04-18: qty 0.6
  Filled 2021-04-18: qty 0.5

## 2021-04-18 MED ORDER — LACTATED RINGERS IV BOLUS
500.0000 mL | Freq: Once | INTRAVENOUS | Status: AC
  Administered 2021-04-18: 12:00:00 500 mL via INTRAVENOUS

## 2021-04-18 MED ORDER — PHENYLEPHRINE HCL-NACL 20-0.9 MG/250ML-% IV SOLN
25.0000 ug/min | INTRAVENOUS | Status: DC
Start: 2021-04-18 — End: 2021-04-21
  Administered 2021-04-18: 22:00:00 20 ug/min via INTRAVENOUS
  Administered 2021-04-18: 14:00:00 25 ug/min via INTRAVENOUS
  Administered 2021-04-19: 16:00:00 20 ug/min via INTRAVENOUS
  Filled 2021-04-18 (×3): qty 250

## 2021-04-18 MED ORDER — PROSOURCE TF PO LIQD
45.0000 mL | Freq: Four times a day (QID) | ORAL | Status: DC
  Administered 2021-04-18 – 2021-04-19 (×4): 45 mL
  Filled 2021-04-18 (×7): qty 45

## 2021-04-18 MED ORDER — ADULT MULTIVITAMIN W/MINERALS CH
1.0000 | ORAL_TABLET | Freq: Every day | ORAL | Status: DC
  Administered 2021-04-19 – 2021-04-20 (×2): 1
  Filled 2021-04-18 (×2): qty 1

## 2021-04-18 MED ORDER — LACTATED RINGERS IV BOLUS
500.0000 mL | Freq: Once | INTRAVENOUS | Status: AC
  Administered 2021-04-18: 500 mL via INTRAVENOUS

## 2021-04-18 MED ORDER — PHENYLEPHRINE HCL-NACL 20-0.9 MG/250ML-% IV SOLN: 0.0000 ug/min | INTRAVENOUS | Status: DC

## 2021-04-18 NOTE — Consult Note (Signed)
PHARMACIST - PHYSICIAN COMMUNICATION  CONCERNING:  Enoxaparin (Lovenox) for DVT Prophylaxis    RECOMMENDATION: Patient was prescribed enoxaprin 40mg  q24 hours for VTE prophylaxis.   Filed Weights   04/17/21 1731 04/17/21 2020 04/18/21 0500  Weight: 81.6 kg (180 lb) 99.4 kg (219 lb 2.2 oz) 99.5 kg (219 lb 5.7 oz)    Body mass index is 35.42 kg/m.  Estimated Creatinine Clearance: 59.9 mL/min (A) (by C-G formula based on SCr of 1.04 mg/dL (H)).   Based on [facility] policy patient is candidate for enoxaparin 0.5mg /kg TBW SQ every 24 hours based on BMI being >30.   DESCRIPTION: Pharmacy has adjusted enoxaparin dose per [facility] policy.  Patient is now receiving enoxaparin 50 mg every 24 hours    [facility], PharmD Pharmacy Resident  04/18/2021 10:41 AM

## 2021-04-18 NOTE — TOC Initial Note (Signed)
Transition of Care [facility]) - Initial/Assessment Note    Patient Details  Name: Sharon [name] MRN: [id number] Date of Birth: Apr 12, 1951  [facility] CM/SW Contact:    Hetty Ely, RN Phone Number: 04/18/2021, 1:46 PM  Clinical Narrative:  Patient sedated and ventilated unable to do TOC assessment will try later.                       Patient Goals and CMS Choice        Expected Discharge Plan and Services                                                Prior Living Arrangements/Services                       Activities of Daily Living      Permission Sought/Granted                  Emotional Assessment              Admission diagnosis:  Angioedema [T78.3XXA] Angioedema, initial encounter [T78.3XXA] Severe tongue swelling [R22.0] Patient Active Problem List   Diagnosis Date Noted   Angioedema 04/17/2021   Dizziness 08/23/2020   Fatigue 08/23/2020   Rib fracture 08/23/2020   Elevated fasting glucose 12/17/2019   Encounter for hepatitis C screening test for low risk patient 12/17/2019   Medicare annual wellness visit, subsequent 08/28/2018   Cough 01/28/2018   Welcome to Medicare preventive visit 08/22/2016   Obesity 10/05/2014   Colon cancer screening 03/03/2013   Hypokalemia 10/09/2011   Routine general medical examination at a health care facility 10/02/2011   Vitamin D deficiency disease 12/28/2010   ARTHRALGIA 08/19/2007   Hypothyroidism 01/31/2007   Hyperlipidemia 01/31/2007   ANXIETY 01/31/2007   Essential hypertension 01/31/2007   GERD 01/31/2007   PCP:  Judy Pimple, MD Pharmacy:   [facility] [phone] - [address], [place] - [address] [place] [address] Phone: [phone] Fax: [phone]  [facility]) - [place], [place] - [address] [facility] [address] [place] [address] Phone: [phone] Fax:  [phone]  [facility] ([facility] ) - [place], [place] - [address] [place]  [address] Phone: [phone] Fax: [phone]     Social Determinants of Health (SDOH) Interventions    Readmission Risk Interventions No flowsheet data found.

## 2021-04-18 NOTE — Progress Notes (Signed)
PHARMACY CONSULT NOTE  Pharmacy Consult for Electrolyte Monitoring and Replacement   Recent Labs: Potassium (mmol/L)  Date Value  04/18/2021 4.1   Magnesium (mg/dL)  Date Value  60/15/6153 2.4   Calcium (mg/dL)  Date Value  79/43/2761 8.7 (L)   Albumin (g/dL)  Date Value  47/12/2955 4.1   Phosphorus (mg/dL)  Date Value  47/34/0370 4.1   Sodium (mmol/L)  Date Value  04/18/2021 134 (L)    Assessment:70 y/o female with h/o HTN, GERD, anxiety and HLD who is admitted with angioedema. Pharmacy is asked to follow and replace electrolytes while the patient is in the CCU.  Nutrition:    Vital HP @40ml /hr + ProSource 102ml QID via tube    Free water flushes 58ml q4 hours to maintain tube patency    Goal of Therapy:  Electrolytes WNL  Plan:  No electrolyte replacement warranted at this time Recheck electrolytes in am 12/27  1/28 ,PharmD Clinical Pharmacist 04/18/2021 7:12 AM

## 2021-04-18 NOTE — Progress Notes (Signed)
NAME:  Sharon [name], MRN:  [id number], DOB:  [birth year], LOS: 1 ADMISSION DATE:  04/17/2021, CONSULTATION DATE: 04/17/2021 REFERRING MD: Dr. Adaline Sill, CHIEF COMPLAINT: Oral swelling  Brief Pt Description/Synopsis:  70 y.o. female admitted with Acute Respiratory Failure secondary to airway compromise from angioedema in the setting of suspected allergic reaction from ARB requiring intubation for airway protection.   History of Present Illness:  70 year old female presenting to [facility] ED on 04/17/2021 from home with her husband.  (History obtained from husband who is bedside and ED documentation) Patient was at her son's for holiday dinner eating some ham and mashed potatoes, per husband there was nothing unusual at dinner.  She reported biting her tongue, with mild swelling. Once back at the house, she laid down for a nap and realized she was having trouble breathing when supine & they both noticed the swelling in her tongue was worsening, now including the right side of her face.  They became concerned, and drove to the ED for evaluation.  Patient's husband denies anything like this happening before in their 20 years of marriage.  He is not sure but does not believe his son cooks with any unusual type of oil, i.e. peanut oil.  Of note, upon chart review, in the patient's medication list she is prescribed losartan.  Husband denies anything out of the ordinary in the week prior, denying that his wife complained of any pain/bleeding/shortness of breath/fever or chills. ED course: EDP noted swelling of the floor of her mouth, mild swelling to her eyelids and face.  Due to concern for airway protection the patient and her husband were consented for urgent rapid sequence intubation and mechanical ventilatory support.  Dr. Jayme Cloud assisted in the mechanical intubation. Medications given: C1 esterase, Decadron, ketamine, rocuronium, tranexamic acid Initial Vitals: Afebrile 98.6, RR 20, tachycardic at 138,  hypertensive at 186/114 and SPO2 92% on RA Significant labs: (Labs/ Imaging personally reviewed) I, Cheryll Cockayne Rust-Chester, AGACNP-BC, personally viewed and interpreted this ECG. EKG Interpretation: Date: 04/17/21, EKG Time: 17:23, Rate: 87, Rhythm: NSR, QRS Axis:  normal, Intervals: normal, ST/T Wave abnormalities: none, Narrative Interpretation: NSR Chemistry: Initial BMP WNL except for Cr which was slightly elevated at 1.06 and has since corrected to 0.99. Hematology: WBC: 7.6 > 10.6, Hgb: 13> 11.9,  COVID-19 & Influenza A/B: negative ABG: 7.41/ 36/130/22.8  All other labs WNL CXR 04/17/21: Patchy perihilar opacity  PCCM consulted for admission.  Pertinent  Medical History  Mitral valve prolapse (1980s) Hyperlipidemia Hypertension Hypothyroidism Remote smoking history (26 years) Vitamin D deficiency Significant Hospital Events: Including procedures, antibiotic start and stop dates in addition to other pertinent events   04/17/2021: Admit to ICU due to angioedema requiring urgent RSI and mechanical ventilatory support 04/18/2021: swelling slowly improving. Will keep on vent today, will check cuff leak tomorrow  Interim History / Subjective:  -No signficant events noted overnight -Did receive a total of 1L or LR overnight for decreased UOP -Afebrile, hemodynamically stable, NO vasopressors -Angioedema slowly improving ~ discussed with Dr. Jayme Cloud, will keep intubated today and check cuff leak tomorrow -TSH 6.2 ~ increased dose of synthroid to 112 mcg  Objective   Blood pressure 94/63, pulse 72, temperature 98.2 F (36.8 C), temperature source Bladder, resp. rate 14, height 5' 5.98" (1.676 m), weight 99.5 kg, SpO2 94 %.    Vent Mode: PRVC FiO2 (%):  [30 %-100 %] 30 % Set Rate:  [14 bmp-16 bmp] 14 bmp Vt Set:  [480 mL] 480 mL  PEEP:  [5 cmH20-8 cmH20] 5 cmH20 Plateau Pressure:  [16 cmH20] 16 cmH20   Intake/Output Summary (Last 24 hours) at 04/18/2021 0851 Last data filed  at 04/18/2021 0800 Gross per 24 hour  Intake 1907.49 ml  Output 565 ml  Net 1342.49 ml    Filed Weights   04/17/21 1731 04/17/21 2020 04/18/21 0500  Weight: 81.6 kg 99.4 kg 99.5 kg    Examination: General: Adult female, acutely ill appearing, lying in bed intubated & sedated requiring mechanical ventilation, NAD HEENT: MM pink/moist, anicteric, atraumatic, neck supple, orally intubated Neuro: Sedated (RASS -1), arouses to voice and follows commands, no focal deficits, unable to assess orientation due to ETT, PERRL +3  CV: s1s2 RRR, NSR-ST on monitor, no r/m/g Pulm: Clear diminished breath sounds bilaterally, synchronous with the vent, even, nonlabored GI: soft, rounded, nontender, no guarding or rebound tenderness, bs x 4 GU: foley in place with clear yellow urine Skin: no rashes/lesions noted Extremities: warm/dry, pulses + 2 R/P, +3 edema noted bilateral ankles  Resolved Hospital Problem list     Assessment & Plan:  Acute Respiratory Failure secondary to airway compromise from angioedema in the setting of suspected allergic reaction from ARB PMHx: remote smoking history C1 esterase inhibitor given, 10 mg decadron - Ventilator settings: PRVC  8 mL/kg, continue ventilator support & lung protective strategies - Wean PEEP & FiO2 as tolerated, maintain SpO2 > 90% - Head of bed elevated 30 degrees, VAP protocol in place - Plateau pressures less than 30 cm H20  - Intermittent chest x-ray & ABG PRN - Daily WUA with SBT as tolerated  - Ensure adequate pulmonary hygiene  - Steroids initiated: decadron 4 mg Q 6 h - famotidine IV, benadryl Q 8 h - Bronchodilators Q 6 - daily cuff leak assessment - PAD protocol in place: continue Fentanyl drip & Propofol drip  Hypothyroidism -TSH increased at 6.255 ~ discussed with Dr. Jayme Cloud, will increase dose of Synthroid to 112 mcg  Hypertension Hyperlipidemia -Continuous cardiac monitoring -Maintain MAP >65 - outpatient losartan on hold  due to sedation & suspected allergic reaction, added to allergy list, will need alternative agent once extubated - consider restarting home simvastatin as patient stabilizes  Sedation needs in setting of mechanical ventilation PMHx: Anxiety -Maintain a RASS goal of 0 to -1 -Fentanyl and Propofol as needed to maintain RASS goal -Avoid sedating medications as able -Daily wake up assessment - outpatient regimen on hold, consider restarting bupropion & sertraline as patient stabilizes  Best Practice (right click and "Reselect all SmartList Selections" daily)  Diet/type: NPO w/ meds via tube DVT prophylaxis: LMWH GI prophylaxis: H2B Lines: N/A Foley:  Yes, and it is still needed Code Status:  full code Last date of multidisciplinary goals of care discussion [04/18/21]  Dr. Jayme Cloud updated pt's husband at bedside 12/26.  Labs   CBC: Recent Labs  Lab 04/17/21 1728 04/17/21 1928 04/18/21 0627  WBC 7.6 10.6* 10.3  NEUTROABS 4.2  --   --   HGB 13.0 11.9* 11.6*  HCT 39.9 36.2 35.2*  MCV 97.8 97.6 97.0  PLT 225 224 265     Basic Metabolic Panel: Recent Labs  Lab 04/17/21 1728 04/17/21 1928 04/18/21 0440  NA 135  --  134*  K 3.6  --  4.1  CL 100  --  105  CO2 29  --  20*  GLUCOSE 108*  --  150*  BUN 19  --  17  CREATININE 1.06* 0.99 1.04*  CALCIUM 9.5  --  8.7*  MG  --  1.9 2.4  PHOS  --  3.0 4.1    GFR: Estimated Creatinine Clearance: 59.9 mL/min (A) (by C-G formula based on SCr of 1.04 mg/dL (H)). Recent Labs  Lab 04/17/21 1728 04/17/21 1928 04/18/21 0627  WBC 7.6 10.6* 10.3     Liver Function Tests: Recent Labs  Lab 04/17/21 1728  AST 20  ALT 11  ALKPHOS 67  BILITOT 0.7  PROT 7.8  ALBUMIN 4.1    No results for input(s): LIPASE, AMYLASE in the last 168 hours. No results for input(s): AMMONIA in the last 168 hours.  ABG    Component Value Date/Time   PHART 7.41 04/17/2021 1817   PCO2ART 36 04/17/2021 1817   PO2ART 130 (H) 04/17/2021 1817    HCO3 22.8 04/17/2021 1817   ACIDBASEDEF 1.4 04/17/2021 1817   O2SAT 99.0 04/17/2021 1817      Coagulation Profile: Recent Labs  Lab 04/17/21 1728  INR 0.9     Cardiac Enzymes: No results for input(s): CKTOTAL, CKMB, CKMBINDEX, TROPONINI in the last 168 hours.  HbA1C: No results found for: HGBA1C  CBG: Recent Labs  Lab 04/17/21 2030 04/18/21 0009 04/18/21 0349 04/18/21 0723  GLUCAP 157* 158* 142* 153*    Review of Systems:   UTA- patient intubated requiring mechanical ventilatory support.  Past Medical History:  She,  has a past medical history of Anxiety, Cataract, GERD (gastroesophageal reflux disease), Hyperlipidemia, Hypertension, Hypothyroidism, and Vitamin D deficiency.   Surgical History:   Past Surgical History:  Procedure Laterality Date   CATARACT EXTRACTION W/ INTRAOCULAR LENS IMPLANT Right 12/29/2016   Dr. Mia Creek   CATARACT EXTRACTION W/ INTRAOCULAR LENS IMPLANT Left 01/15/2017   Dr. Mia Creek   COLONOSCOPY     LEEP     TUBAL LIGATION     VEIN LIGATION AND STRIPPING     x2     Social History:   reports that she quit smoking about 25 years ago. She has never used smokeless tobacco. She reports current alcohol use. She reports that she does not use drugs.   Family History:  Her family history includes Hypertension in her father and mother; Transient ischemic attack in her mother. There is no history of Colon cancer, Esophageal cancer, Rectal cancer, or Stomach cancer.   Allergies Allergies  Allergen Reactions   Cozaar [Losartan] Swelling   Amlodipine Besylate     REACTION: edema   Atorvastatin     REACTION: muscle pain   Neomycin-Bacitracin Zn-Polymyx     REACTION: rash Per pt not sure if from meds or not     Home Medications  Prior to Admission medications   Medication Sig Start Date End Date Taking? Authorizing Provider  aspirin 81 MG tablet Take 81 mg by mouth daily.    [provider]  buPROPion (WELLBUTRIN XL)  150 MG 24 hr tablet TAKE 1 TABLET BY MOUTH  DAILY 01/10/21   Tower, Audrie Gallus, MD  calcium carbonate (TUMS EX) 750 MG chewable tablet Chew 2 tablets by mouth at bedtime.    [provider]  Cholecalciferol (VITAMIN D) 1000 UNITS capsule Take 2,000 Units by mouth daily.     [provider]  fluticasone (FLONASE) 50 MCG/ACT nasal spray USE 2 SPRAYS IN BOTH  NOSTRILS DAILY 10/19/20   Tower, Audrie Gallus, MD  hydrochlorothiazide (HYDRODIURIL) 50 MG tablet TAKE 1 TABLET BY MOUTH  DAILY 01/10/21   Tower, Audrie Gallus, MD  levothyroxine (SYNTHROID) 50 MCG tablet TAKE  1 TABLET BY MOUTH  DAILY BEFORE BREAKFAST 01/10/21   Tower, Audrie Gallus, MD  losartan (COZAAR) 100 MG tablet TAKE 1 TABLET BY MOUTH  DAILY 12/20/20   Tower, Audrie Gallus, MD  pantoprazole (PROTONIX) 40 MG tablet TAKE 1 TABLET BY MOUTH  DAILY 01/10/21   Tower, Audrie Gallus, MD  potassium chloride (KLOR-CON) 10 MEQ tablet Take 1 tablet (10 mEq total) by mouth daily. 04/11/21   Tower, Audrie Gallus, MD  PROAIR HFA 108 [phone] Base) MCG/ACT inhaler TAKE 2 PUFFS BY MOUTH EVERY 6 HOURS AS NEEDED FOR WHEEZE OR SHORTNESS OF BREATH 02/20/18   Tower, Audrie Gallus, MD  sertraline (ZOLOFT) 100 MG tablet TAKE 1 TABLET BY MOUTH  DAILY 01/10/21   Tower, Audrie Gallus, MD  simvastatin (ZOCOR) 80 MG tablet TAKE 1 TABLET BY MOUTH  DAILY 01/10/21   Tower, Audrie Gallus, MD     Critical care time: 40 minutes     Harlon Ditty, AGACNP-BC [facility] Prefer epic messenger for cross cover needs If after hours, please call E-link

## 2021-04-18 NOTE — Progress Notes (Signed)
Sedation titrated due pt's dys synchrony with the ventilator. 500 ml LR bolus given due to pt's decreased urine output. Neo synephrine started as per pt's blood pressure. IV watch in place.

## 2021-04-18 NOTE — Progress Notes (Addendum)
0000- Pt has low urine output. San Jetty, NP made aware. 500 mL bolus ordered.  0230- Pt continues to have low urine output. NP made aware again. NP ordered another 500 mL bolus.   0600- Urine output remains low. NP made aware. Will continue to assess.

## 2021-04-18 NOTE — Plan of Care (Signed)
  Problem: Nutrition: Goal: Adequate nutrition will be maintained Outcome: Progressing   

## 2021-04-18 NOTE — Progress Notes (Addendum)
Initial Nutrition Assessment  DOCUMENTATION CODES:   Obesity unspecified  INTERVENTION:   Vital HP @40ml /hr + ProSource 34ml QID via tube   Free water flushes 10ml q4 hours to maintain tube patency   Propofol: 17.89 ml/hr- provides 472kcal/day   Regimen provides 1592kcal/day, 128g/day protein and 939ml/day free water  MVI daily via tube   NUTRITION DIAGNOSIS:   Inadequate oral intake related to inability to eat (pt sedated and ventilated) as evidenced by NPO status.  GOAL:   Provide needs based on ASPEN/SCCM guidelines  MONITOR:   Vent status, Labs, Weight trends, TF tolerance, I & O's, Skin  REASON FOR ASSESSMENT:   Consult Enteral/tube feeding initiation and management  ASSESSMENT:   70 y/o female with h/o HTN, GERD, anxiety and HLD who is admitted with angioedema.  Pt sedated and ventilated. NGT in place. Plan is to start tube feeds today. Per chart, pt with weight gain pta.   Medications reviewed and include: dexamethasone, colace, lovenox, synthroid, miralax, pepcid, LRS @100ml /hr, propofol   Labs reviewed: Na 134(L), K 4.1 wnl, creat 1.04(H), P 4.1 wnl, Mg 2.4 wnl Cbgs- 149, 153, 142, 158 x 24 hrs  Patient is currently intubated on ventilator support MV: 7.0 L/min Temp (24hrs), Avg:98.6 F (37 C), Min:96 F (35.6 C), Max:99.7 F (37.6 C)  Propofol: 17.89 ml/hr- provides 472kcal/day   MAP- >28mmHg   UOP-   NUTRITION - FOCUSED PHYSICAL EXAM:  Flowsheet Row Most Recent Value  Orbital Region No depletion  Upper Arm Region No depletion  Thoracic and Lumbar Region No depletion  Buccal Region No depletion  Temple Region No depletion  Clavicle Bone Region No depletion  Clavicle and Acromion Bone Region No depletion  Scapular Bone Region No depletion  Dorsal Hand No depletion  Patellar Region No depletion  Anterior Thigh Region No depletion  Posterior Calf Region No depletion  Edema (RD Assessment) Mild  Hair Reviewed  Eyes Reviewed   Mouth Reviewed  Skin Reviewed  Nails Reviewed   Diet Order:   Diet Order             Diet NPO time specified  Diet effective now                  EDUCATION NEEDS:   No education needs have been identified at this time  Skin:  Skin Assessment: Reviewed RN Assessment  Last BM:  pta  Height:   Ht Readings from Last 1 Encounters:  04/17/21 5' 5.98" (1.676 m)    Weight:   Wt Readings from Last 1 Encounters:  04/18/21 99.5 kg    Ideal Body Weight:  59 kg  BMI:  Body mass index is 35.42 kg/m.  Estimated Nutritional Needs:   Kcal:  1094-1393kcal/day  Protein:  >120g/day  Fluid:  1.5-1.8L/day  04/19/21 MS, RD, LDN Please refer to [facility] for RD and/or RD on-call/weekend/after hours pager

## 2021-04-19 ENCOUNTER — Inpatient Hospital Stay: Payer: Medicare Other

## 2021-04-19 LAB — CBC
HCT: 34.9 % — ABNORMAL LOW (ref 36.0–46.0)
Hemoglobin: 11.4 g/dL — ABNORMAL LOW (ref 12.0–15.0)
MCH: 32.9 pg (ref 26.0–34.0)
MCHC: 32.7 g/dL (ref 30.0–36.0)
MCV: 100.9 fL — ABNORMAL HIGH (ref 80.0–100.0)
Platelets: 214 10*3/uL (ref 150–400)
RBC: 3.46 MIL/uL — ABNORMAL LOW (ref 3.87–5.11)
RDW: 13.8 % (ref 11.5–15.5)
WBC: 19.8 10*3/uL — ABNORMAL HIGH (ref 4.0–10.5)
nRBC: 0 % (ref 0.0–0.2)

## 2021-04-19 LAB — RENAL FUNCTION PANEL
Albumin: 3.5 g/dL (ref 3.5–5.0)
Anion gap: 9 (ref 5–15)
BUN: 35 mg/dL — ABNORMAL HIGH (ref 8–23)
CO2: 25 mmol/L (ref 22–32)
Calcium: 8.8 mg/dL — ABNORMAL LOW (ref 8.9–10.3)
Chloride: 105 mmol/L (ref 98–111)
Creatinine, Ser: 1.38 mg/dL — ABNORMAL HIGH (ref 0.44–1.00)
GFR, Estimated: 41 mL/min — ABNORMAL LOW (ref >60)
Glucose, Bld: 168 mg/dL — ABNORMAL HIGH (ref 70–99)
Phosphorus: 4.2 mg/dL (ref 2.5–4.6)
Potassium: 4 mmol/L (ref 3.5–5.1)
Sodium: 139 mmol/L (ref 135–145)

## 2021-04-19 LAB — CBC WITH DIFFERENTIAL/PLATELET
Abs Immature Granulocytes: 0.12 10*3/uL — ABNORMAL HIGH (ref 0.00–0.07)
Basophils Absolute: 0 10*3/uL (ref 0.0–0.1)
Basophils Relative: 0 %
Eosinophils Absolute: 0 10*3/uL (ref 0.0–0.5)
Eosinophils Relative: 0 %
HCT: 35.7 % — ABNORMAL LOW (ref 36.0–46.0)
Hemoglobin: 11.3 g/dL — ABNORMAL LOW (ref 12.0–15.0)
Immature Granulocytes: 1 %
Lymphocytes Relative: 8 %
Lymphs Abs: 1.6 10*3/uL (ref 0.7–4.0)
MCH: 32.3 pg (ref 26.0–34.0)
MCHC: 31.7 g/dL (ref 30.0–36.0)
MCV: 102 fL — ABNORMAL HIGH (ref 80.0–100.0)
Monocytes Absolute: 1.2 10*3/uL — ABNORMAL HIGH (ref 0.1–1.0)
Monocytes Relative: 6 %
Neutro Abs: 16.7 10*3/uL — ABNORMAL HIGH (ref 1.7–7.7)
Neutrophils Relative %: 85 %
Platelets: 164 10*3/uL (ref 150–400)
RBC: 3.5 MIL/uL — ABNORMAL LOW (ref 3.87–5.11)
RDW: 14 % (ref 11.5–15.5)
WBC: 19.7 10*3/uL — ABNORMAL HIGH (ref 4.0–10.5)
nRBC: 0 % (ref 0.0–0.2)

## 2021-04-19 LAB — BLOOD GAS, ARTERIAL
Acid-base deficit: 1.4 mmol/L (ref 0.0–2.0)
Bicarbonate: 22.8 mmol/L (ref 20.0–28.0)
FIO2: 100
MECHVT: 480 mL
O2 Saturation: 99 %
PEEP: 8 cmH2O
Patient temperature: 37
RATE: 16 resp/min
pCO2 arterial: 36 mmHg (ref 32.0–48.0)
pH, Arterial: 7.41 (ref 7.350–7.450)
pO2, Arterial: 130 mmHg — ABNORMAL HIGH (ref 83.0–108.0)

## 2021-04-19 LAB — PROCALCITONIN: Procalcitonin: <0.1 ng/mL

## 2021-04-19 LAB — GLUCOSE, CAPILLARY
Glucose-Capillary: 136 mg/dL — ABNORMAL HIGH (ref 70–99)
Glucose-Capillary: 146 mg/dL — ABNORMAL HIGH (ref 70–99)
Glucose-Capillary: 151 mg/dL — ABNORMAL HIGH (ref 70–99)
Glucose-Capillary: 152 mg/dL — ABNORMAL HIGH (ref 70–99)
Glucose-Capillary: 163 mg/dL — ABNORMAL HIGH (ref 70–99)
Glucose-Capillary: 167 mg/dL — ABNORMAL HIGH (ref 70–99)

## 2021-04-19 LAB — C1 ESTERASE INHIBITOR: C1INH SerPl-mCnc: 37 mg/dL (ref 21–39)

## 2021-04-19 LAB — C3 COMPLEMENT: C3 Complement: 139 mg/dL (ref 82–167)

## 2021-04-19 LAB — C4 COMPLEMENT: Complement C4, Body Fluid: 26 mg/dL (ref 12–38)

## 2021-04-19 LAB — MAGNESIUM: Magnesium: 2.1 mg/dL (ref 1.7–2.4)

## 2021-04-19 MED ORDER — PROSOURCE TF PO LIQD
90.0000 mL | Freq: Three times a day (TID) | ORAL | Status: DC
Start: 2021-04-19 — End: 2021-04-20
  Administered 2021-04-19 – 2021-04-20 (×3): 90 mL
  Filled 2021-04-19 (×5): qty 90

## 2021-04-19 MED ORDER — VITAL HIGH PROTEIN PO LIQD
1000.0000 mL | ORAL | Status: DC
  Administered 2021-04-19: 12:00:00 1000 mL

## 2021-04-19 NOTE — Plan of Care (Signed)
Neuro: stable on fentanyl and propofol sedation, able to open eyes per request though appears anxious while waking up Resp: stable on ventilator, continues to have oral edema despite having cuff leak, delay extubation until reevaluation tomorrow CV: afebrile, vital signs fairly stable, remains on phenylephrine for blood pressure support-attempting wean GIGU: foley in place, tolerating tube feeds well/NG in place, no BM Skin: clean dry and intact Social: Husband at the bedside throughout the day, all questions and concerns addressed  Problem: Education: Goal: Knowledge of General Education information will improve Description: Including pain rating scale, medication(s)/side effects and non-pharmacologic comfort measures Outcome: Progressing   Problem: Health Behavior/Discharge Planning: Goal: Ability to manage health-related needs will improve Outcome: Progressing   Problem: Clinical Measurements: Goal: Ability to maintain clinical measurements within normal limits will improve Outcome: Progressing Goal: Will remain free from infection Outcome: Progressing Goal: Diagnostic test results will improve Outcome: Progressing Goal: Respiratory complications will improve Outcome: Progressing Goal: Cardiovascular complication will be avoided Outcome: Progressing   Problem: Activity: Goal: Risk for activity intolerance will decrease Outcome: Progressing   Problem: Nutrition: Goal: Adequate nutrition will be maintained Outcome: Progressing   Problem: Coping: Goal: Level of anxiety will decrease Outcome: Progressing   Problem: Elimination: Goal: Will not experience complications related to bowel motility Outcome: Progressing Goal: Will not experience complications related to urinary retention Outcome: Progressing   Problem: Pain Managment: Goal: General experience of comfort will improve Outcome: Progressing   Problem: Safety: Goal: Ability to remain free from injury will  improve Outcome: Progressing   Problem: Skin Integrity: Goal: Risk for impaired skin integrity will decrease Outcome: Progressing   Problem: Activity: Goal: Ability to tolerate increased activity will improve Outcome: Progressing   Problem: Respiratory: Goal: Ability to maintain a clear airway and adequate ventilation will improve Outcome: Progressing   Problem: Role Relationship: Goal: Method of communication will improve Outcome: Progressing

## 2021-04-19 NOTE — Progress Notes (Signed)
NAME:  Sharon [name], MRN:  [id number], DOB:  26-Jun-1950, LOS: 2 ADMISSION DATE:  04/17/2021, CONSULTATION DATE: 04/17/2021 REFERRING MD: Dr. Adaline Sill, CHIEF COMPLAINT: Oral swelling  Brief Pt Description/Synopsis:  70 y.o. female admitted with Acute Respiratory Failure secondary to airway compromise from angioedema in the setting of suspected allergic reaction from ARB requiring intubation for airway protection.   History of Present Illness:  70 year old female presenting to [facility] ED on 04/17/2021 from home with her husband.  (History obtained from husband who is bedside and ED documentation) Patient was at her son's for holiday dinner eating some ham and mashed potatoes, per husband there was nothing unusual at dinner.  She reported biting her tongue, with mild swelling. Once back at the house, she laid down for a nap and realized she was having trouble breathing when supine & they both noticed the swelling in her tongue was worsening, now including the right side of her face.  They became concerned, and drove to the ED for evaluation.  Patient's husband denies anything like this happening before in their 60 years of marriage.  He is not sure but does not believe his son cooks with any unusual type of oil, i.e. peanut oil.  Of note, upon chart review, in the patient's medication list she is prescribed losartan.  Husband denies anything out of the ordinary in the week prior, denying that his wife complained of any pain/bleeding/shortness of breath/fever or chills. ED course: EDP noted swelling of the floor of her mouth, mild swelling to her eyelids and face.  Due to concern for airway protection the patient and her husband were consented for urgent rapid sequence intubation and mechanical ventilatory support.  Dr. Jayme Cloud assisted in the mechanical intubation. Medications given: C1 esterase, Decadron, ketamine, rocuronium, tranexamic acid Initial Vitals: Afebrile 98.6, RR 20, tachycardic at 138,  hypertensive at 186/114 and SPO2 92% on RA Significant labs: (Labs/ Imaging personally reviewed) I, Cheryll Cockayne Rust-Chester, AGACNP-BC, personally viewed and interpreted this ECG. EKG Interpretation: Date: 04/17/21, EKG Time: 17:23, Rate: 87, Rhythm: NSR, QRS Axis:  normal, Intervals: normal, ST/T Wave abnormalities: none, Narrative Interpretation: NSR Chemistry: Initial BMP WNL except for Cr which was slightly elevated at 1.06 and has since corrected to 0.99. Hematology: WBC: 7.6 > 10.6, Hgb: 13> 11.9,  COVID-19 & Influenza A/B: negative ABG: 7.41/ 36/130/22.8  All other labs WNL CXR 04/17/21: Patchy perihilar opacity  PCCM consulted for admission.  Pertinent  Medical History  Mitral valve prolapse (1980s) Hyperlipidemia Hypertension Hypothyroidism Remote smoking history (26 years) Vitamin D deficiency Significant Hospital Events: Including procedures, antibiotic start and stop dates in addition to other pertinent events   04/17/2021: Admit to ICU due to angioedema requiring urgent RSI and mechanical ventilatory support 04/18/2021: swelling slowly improving. Will keep on vent today, will check cuff leak tomorrow 04/19/2021: Small cuff leak present but still some swelling.  Will keep intubated today.  New leukocytosis, but PCT is negative, afebrile, and no appreciable secretions from ETT.  CXR with bibasilar opacities (atelectasis vs infection).  Low threshold to start ABX.  Interim History / Subjective:  -No signficant events noted overnight -Afebrile, hemodynamically stable, is requiring low dose Neo @ 20 mcg -Angioedema slowly improving ~ small cuff leak noted but still has oral swelling present ~ Discussed with Dr. Earlie Server, will remain intubated today -New leukocytosis of 19.7 (10.3).  Procalcitonin is negative. CXR with increased bibasilar opacities (atelectasis vs infection); Afebrile and no secretions from ETT -Creatinine increased today to 1.38 (  1.04 yesterday).  Electrolytes  acceptable and no acidosis, UOP 945 cc (+4.9L since admit)  Objective   Blood pressure (!) 95/57, pulse 86, temperature 99 F (37.2 C), resp. rate 14, height 5' 5.98" (1.676 m), weight 104.4 kg, SpO2 94 %.    Vent Mode: PRVC FiO2 (%):  [35 %-45 %] 35 % Set Rate:  [14 bmp] 14 bmp Vt Set:  [480 mL] 480 mL PEEP:  [5 cmH20] 5 cmH20 Plateau Pressure:  [25 cmH20] 25 cmH20   Intake/Output Summary (Last 24 hours) at 04/19/2021 1404 Last data filed at 04/19/2021 1227 Gross per 24 hour  Intake 4077.57 ml  Output 1175 ml  Net 2902.57 ml    Filed Weights   04/17/21 2020 04/18/21 0500 04/19/21 0500  Weight: 99.4 kg 99.5 kg 104.4 kg    Examination: General: Adult female, acutely ill appearing, lying in bed intubated & sedated requiring mechanical ventilation, NAD HEENT: MM pink/moist, anicteric, atraumatic, neck supple, orally intubated Neuro: Sedated (RASS -1), arouses to voice and follows commands, no focal deficits, unable to assess orientation due to ETT, PERRL +3  CV: s1s2 RRR, NSR-ST on monitor, no r/m/g Pulm: Clear diminished breath sounds bilaterally, synchronous with the vent, even, nonlabored GI: soft, rounded, nontender, no guarding or rebound tenderness, bs x 4 GU: foley in place with clear yellow urine Skin: no rashes/lesions noted Extremities: warm/dry, pulses + 2 R/P, +3 edema noted bilateral ankles  Resolved Hospital Problem list     Assessment & Plan:   Acute Respiratory Failure secondary to airway compromise from angioedema in the setting of suspected allergic reaction from ARB PMHx: remote smoking history C1 esterase inhibitor given, 10 mg decadron - Ventilator settings: PRVC  8 mL/kg, continue ventilator support & lung protective strategies - Wean PEEP & FiO2 as tolerated, maintain SpO2 > 90% - Head of bed elevated 30 degrees, VAP protocol in place - Plateau pressures less than 30 cm H20  - Intermittent chest x-ray & ABG PRN - Daily WUA with SBT as tolerated   - Ensure adequate pulmonary hygiene  - Steroids initiated: decadron 4 mg Q 6 h - famotidine IV, benadryl Q 8 h - Bronchodilators Q 6 - daily cuff leak assessment - PAD protocol in place: continue Fentanyl drip & Propofol drip -Serum IgE, C3 & C4 complement, C1 esterase inhibtor pending -Dr. Jayme Cloud recommends outpatient follow up with Dr. Lucie Leather with [facility] in [facility] Leukocytosis -Monitor fever curve -Trend WBC's & Procalcitonin -Follow cultures as above -CXR 12/27 with Low lung volumes with increased bibasilar opacities, atelectasis versus infection -Pt is afebrile and procalcitonin is negative, no significant secretions noted from ETT.  Will hold off on empiric ABX for now and continue to follow with low threshold to begin ABX    AKI -Monitor I&O's / urinary output -Follow BMP -Ensure adequate renal perfusion -Avoid nephrotoxic agents as able -Replace electrolytes as indicated -IV fluids  Hypothyroidism -TSH increased at 6.255 ~  dose of Synthroid to 112 mcg  Hypotension, suspect sedation related PMHx: Hypertension Hyperlipidemia -Continuous cardiac monitoring -Maintain MAP >65 -IVF -Vasopressors as needed to maintain MAP goal - outpatient losartan on hold due to sedation & suspected allergic reaction, added to allergy list, will need alternative agent once extubated - consider restarting home simvastatin as patient stabilizes  Sedation needs in setting of mechanical ventilation PMHx: Anxiety -Maintain a RASS goal of 0 to -1 -Fentanyl and Propofol as needed to maintain RASS goal -Avoid sedating medications as able -Daily  wake up assessment - outpatient regimen on hold, consider restarting bupropion & sertraline as patient stabilizes  Best Practice (right click and "Reselect all SmartList Selections" daily)  Diet/type: NPO w/ meds via tube, tube feeds DVT prophylaxis: LMWH GI prophylaxis: H2B Lines: N/A Foley:  Yes, and it is still  needed Code Status:  full code Last date of multidisciplinary goals of care discussion [04/19/21]  Updated pt's husband at bedside 12/27.  All questions answered.  Labs   CBC: Recent Labs  Lab 04/17/21 1728 04/17/21 1928 04/18/21 0627 04/19/21 0438  WBC 7.6 10.6* 10.3 19.7*   19.8*  NEUTROABS 4.2  --   --  16.7*  HGB 13.0 11.9* 11.6* 11.3*   11.4*  HCT 39.9 36.2 35.2* 35.7*   34.9*  MCV 97.8 97.6 97.0 102.0*   100.9*  PLT 225 224 265 164   214     Basic Metabolic Panel: Recent Labs  Lab 04/17/21 1728 04/17/21 1928 04/18/21 0440 04/19/21 0438  NA 135  --  134* 139  K 3.6  --  4.1 4.0  CL 100  --  105 105  CO2 29  --  20* 25  GLUCOSE 108*  --  150* 168*  BUN 19  --  17 35*  CREATININE 1.06* 0.99 1.04* 1.38*  CALCIUM 9.5  --  8.7* 8.8*  MG  --  1.9 2.4 2.1  PHOS  --  3.0 4.1 4.2    GFR: Estimated Creatinine Clearance: 46.3 mL/min (A) (by C-G formula based on SCr of 1.38 mg/dL (H)). Recent Labs  Lab 04/17/21 1728 04/17/21 1928 04/18/21 0627 04/19/21 0438  PROCALCITON  --   --   --  <0.10  WBC 7.6 10.6* 10.3 19.7*   19.8*     Liver Function Tests: Recent Labs  Lab 04/17/21 1728 04/19/21 0438  AST 20  --   ALT 11  --   ALKPHOS 67  --   BILITOT 0.7  --   PROT 7.8  --   ALBUMIN 4.1 3.5    No results for input(s): LIPASE, AMYLASE in the last 168 hours. No results for input(s): AMMONIA in the last 168 hours.  ABG    Component Value Date/Time   PHART 7.41 04/17/2021 1817   PCO2ART 36 04/17/2021 1817   PO2ART 130 (H) 04/17/2021 1817   HCO3 22.8 04/17/2021 1817   ACIDBASEDEF 1.4 04/17/2021 1817   O2SAT 99.0 04/17/2021 1817      Coagulation Profile: Recent Labs  Lab 04/17/21 1728  INR 0.9     Cardiac Enzymes: No results for input(s): CKTOTAL, CKMB, CKMBINDEX, TROPONINI in the last 168 hours.  HbA1C: No results found for: HGBA1C  CBG: Recent Labs  Lab 04/18/21 1920 04/18/21 2349 04/19/21 0405 04/19/21 0711 04/19/21 1121  GLUCAP  152* 165* 152* 167* 151*     Review of Systems:   UTA- patient intubated requiring mechanical ventilatory support.  Past Medical History:  She,  has a past medical history of Anxiety, Cataract, GERD (gastroesophageal reflux disease), Hyperlipidemia, Hypertension, Hypothyroidism, and Vitamin D deficiency.   Surgical History:   Past Surgical History:  Procedure Laterality Date   CATARACT EXTRACTION W/ INTRAOCULAR LENS IMPLANT Right 12/29/2016   Dr. Mia Creek   CATARACT EXTRACTION W/ INTRAOCULAR LENS IMPLANT Left 01/15/2017   Dr. Mia Creek   COLONOSCOPY     LEEP     TUBAL LIGATION     VEIN LIGATION AND STRIPPING     x2  Social History:   reports that she quit smoking about 25 years ago. She has never used smokeless tobacco. She reports current alcohol use. She reports that she does not use drugs.   Family History:  Her family history includes Hypertension in her father and mother; Transient ischemic attack in her mother. There is no history of Colon cancer, Esophageal cancer, Rectal cancer, or Stomach cancer.   Allergies Allergies  Allergen Reactions   Cozaar [Losartan] Swelling   Amlodipine Besylate     REACTION: edema   Atorvastatin     REACTION: muscle pain   Neomycin-Bacitracin Zn-Polymyx     REACTION: rash Per pt not sure if from meds or not     Home Medications  Prior to Admission medications   Medication Sig Start Date End Date Taking? Authorizing Provider  aspirin 81 MG tablet Take 81 mg by mouth daily.    [provider]  buPROPion (WELLBUTRIN XL) 150 MG 24 hr tablet TAKE 1 TABLET BY MOUTH  DAILY 01/10/21   Tower, Audrie Gallus, MD  calcium carbonate (TUMS EX) 750 MG chewable tablet Chew 2 tablets by mouth at bedtime.    [provider]  Cholecalciferol (VITAMIN D) 1000 UNITS capsule Take 2,000 Units by mouth daily.     [provider]  fluticasone (FLONASE) 50 MCG/ACT nasal spray USE 2 SPRAYS IN BOTH  NOSTRILS DAILY 10/19/20    Tower, Audrie Gallus, MD  hydrochlorothiazide (HYDRODIURIL) 50 MG tablet TAKE 1 TABLET BY MOUTH  DAILY 01/10/21   Tower, Audrie Gallus, MD  levothyroxine (SYNTHROID) 50 MCG tablet TAKE 1 TABLET BY MOUTH  DAILY BEFORE BREAKFAST 01/10/21   Tower, Audrie Gallus, MD  losartan (COZAAR) 100 MG tablet TAKE 1 TABLET BY MOUTH  DAILY 12/20/20   Tower, Audrie Gallus, MD  pantoprazole (PROTONIX) 40 MG tablet TAKE 1 TABLET BY MOUTH  DAILY 01/10/21   Tower, Audrie Gallus, MD  potassium chloride (KLOR-CON) 10 MEQ tablet Take 1 tablet (10 mEq total) by mouth daily. 04/11/21   Tower, Audrie Gallus, MD  PROAIR HFA 108 [phone] Base) MCG/ACT inhaler TAKE 2 PUFFS BY MOUTH EVERY 6 HOURS AS NEEDED FOR WHEEZE OR SHORTNESS OF BREATH 02/20/18   Tower, Audrie Gallus, MD  sertraline (ZOLOFT) 100 MG tablet TAKE 1 TABLET BY MOUTH  DAILY 01/10/21   Tower, Audrie Gallus, MD  simvastatin (ZOCOR) 80 MG tablet TAKE 1 TABLET BY MOUTH  DAILY 01/10/21   Tower, Audrie Gallus, MD     Critical care time: 40 minutes     Harlon Ditty, AGACNP-BC [facility] Prefer epic messenger for cross cover needs If after hours, please call E-link

## 2021-04-19 NOTE — Progress Notes (Signed)
Nutrition Brief Follow Up Note   Propofol change   INTERVENTION:   Decrease Vital HP to 61ml/hr + ProSource 1ml TID via tube   Free water flushes 55ml q4 hours to maintain tube patency   Propofol: 23.9 ml/hr- provides 630kcal/day   Regimen provides 1590kcal/day, 129g/day protein and 733ml/day free water  MVI daily via tube   Estimated Nutritional Needs:   Kcal:  1094-1393kcal/day  Protein:  >120g/day  Fluid:  1.5-1.8L/day  Betsey Holiday MS, RD, LDN Please refer to [facility] for RD and/or RD on-call/weekend/after hours pager

## 2021-04-19 NOTE — Consult Note (Signed)
PHARMACY CONSULT NOTE - FOLLOW UP  Pharmacy Consult for Electrolyte Monitoring and Replacement   Recent Labs: Potassium (mmol/L)  Date Value  04/19/2021 4.0   Magnesium (mg/dL)  Date Value  97/98/9211 2.1   Calcium (mg/dL)  Date Value  94/17/4081 8.8 (L)   Albumin (g/dL)  Date Value  44/81/8563 3.5   Phosphorus (mg/dL)  Date Value  14/97/0263 4.2   Sodium (mmol/L)  Date Value  04/19/2021 139     Assessment: 70 y/o female with h/o HTN, GERD, anxiety and HLD who is admitted with angioedema. Pharmacy is asked to follow and replace electrolytes while the patient is in the CCU  Nutrition:    Vital HP 77ml/hr + ProSource 50ml TID via tube    Free water flushes 33ml q4 hours to maintain tube patency   MIVF: LR @75mL /hr  Labs Na 134>139 K 4.1>4.0 Phos 4.1>4.2 Mg 2.4>2.1  Goal of Therapy:  Electrolytes WNL  Plan:  --No replacement is currently indicated  --Will continue to monitor and replace electrolytes as clinically indicated   , PharmD Pharmacy Resident  04/19/2021 12:05 PM

## 2021-04-20 ENCOUNTER — Inpatient Hospital Stay: Payer: Medicare Other

## 2021-04-20 LAB — BASIC METABOLIC PANEL
Anion gap: 6 (ref 5–15)
BUN: 37 mg/dL — ABNORMAL HIGH (ref 8–23)
CO2: 27 mmol/L (ref 22–32)
Calcium: 8.9 mg/dL (ref 8.9–10.3)
Chloride: 109 mmol/L (ref 98–111)
Creatinine, Ser: 0.84 mg/dL (ref 0.44–1.00)
GFR, Estimated: >60 mL/min (ref >60)
Glucose, Bld: 152 mg/dL — ABNORMAL HIGH (ref 70–99)
Potassium: 4.3 mmol/L (ref 3.5–5.1)
Sodium: 142 mmol/L (ref 135–145)

## 2021-04-20 LAB — PHOSPHORUS: Phosphorus: 2.6 mg/dL (ref 2.5–4.6)

## 2021-04-20 LAB — GLUCOSE, CAPILLARY
Glucose-Capillary: 130 mg/dL — ABNORMAL HIGH (ref 70–99)
Glucose-Capillary: 123 mg/dL — ABNORMAL HIGH (ref 70–99)
Glucose-Capillary: 111 mg/dL — ABNORMAL HIGH (ref 70–99)
Glucose-Capillary: 116 mg/dL — ABNORMAL HIGH (ref 70–99)

## 2021-04-20 LAB — CBC
HCT: 31 % — ABNORMAL LOW (ref 36.0–46.0)
Hemoglobin: 9.7 g/dL — ABNORMAL LOW (ref 12.0–15.0)
MCH: 31.4 pg (ref 26.0–34.0)
MCHC: 31.3 g/dL (ref 30.0–36.0)
MCV: 100.3 fL — ABNORMAL HIGH (ref 80.0–100.0)
Platelets: 218 10*3/uL (ref 150–400)
RBC: 3.09 MIL/uL — ABNORMAL LOW (ref 3.87–5.11)
RDW: 14.2 % (ref 11.5–15.5)
WBC: 9.8 10*3/uL (ref 4.0–10.5)
nRBC: 0 % (ref 0.0–0.2)

## 2021-04-20 LAB — MAGNESIUM: Magnesium: 2.6 mg/dL — ABNORMAL HIGH (ref 1.7–2.4)

## 2021-04-20 LAB — PROCALCITONIN: Procalcitonin: <0.1 ng/mL

## 2021-04-20 MED ORDER — DEXMEDETOMIDINE HCL IN NACL 400 MCG/100ML IV SOLN
0.4000 ug/kg/h | INTRAVENOUS | Status: DC
  Administered 2021-04-20: 19:00:00 0.4 ug/kg/h via INTRAVENOUS
  Administered 2021-04-21: 04:00:00 0.5 ug/kg/h via INTRAVENOUS
  Administered 2021-04-21: 11:00:00 0.4 ug/kg/h via INTRAVENOUS
  Filled 2021-04-20 (×3): qty 100

## 2021-04-20 MED ORDER — VANCOMYCIN HCL 2000 MG/400ML IV SOLN
2000.0000 mg | Freq: Once | INTRAVENOUS | Status: AC
  Administered 2021-04-20: 19:00:00 2000 mg via INTRAVENOUS
  Filled 2021-04-20: qty 400

## 2021-04-20 MED ORDER — SODIUM CHLORIDE 0.9 % IV SOLN
2.0000 g | Freq: Three times a day (TID) | INTRAVENOUS | Status: DC
  Administered 2021-04-20 – 2021-04-22 (×6): 2 g via INTRAVENOUS
  Filled 2021-04-20 (×8): qty 2

## 2021-04-20 MED ORDER — DEXMEDETOMIDINE HCL IN NACL 400 MCG/100ML IV SOLN
0.4000 ug/kg/h | INTRAVENOUS | Status: DC
  Administered 2021-04-20: 09:00:00 0.4 ug/kg/h via INTRAVENOUS
  Filled 2021-04-20: qty 100

## 2021-04-20 MED ORDER — VANCOMYCIN HCL 1250 MG/250ML IV SOLN
1250.0000 mg | INTRAVENOUS | Status: DC
  Filled 2021-04-20: qty 250

## 2021-04-20 NOTE — TOC Initial Note (Signed)
Transition of Care [facility]) - Initial/Assessment Note    Patient Details  Name: Sharon [name] MRN: [id number] Date of Birth: Feb 07, 1951  [facility] CM/SW Contact:    Allayne Butcher, RN Phone Number: 04/20/2021, 11:52 AM  Clinical Narrative:                 Patient admitted to the hospital for angio edema requiring intubation.  Patient seems to be doing better swelling has decreased and she passed her breathing trial this morning.  Plan is for extubation today.  RNCM spoke with patient's husband via phone.  Patient is independent at home and drives.  He reports that he will be there at home to help her after discharge.  RNCM explained that home care services can also be arranged if needed.    TOC will cont to follow.   Expected Discharge Plan: Home w Home Health Services Barriers to Discharge: Continued Medical Work up   Patient Goals and CMS Choice Patient states their goals for this hospitalization and ongoing recovery are:: Patient intubated and sedated- plan for extubation today      Expected Discharge Plan and Services Expected Discharge Plan: Home w Home Health Services   Discharge Planning Services: CM Consult   Living arrangements for the past 2 months: Single Family Home                 DME Arranged: N/A DME Agency: NA                  Prior Living Arrangements/Services Living arrangements for the past 2 months: Single Family Home Lives with:: Spouse Patient language and need for interpreter reviewed:: Yes Do you feel safe going back to the place where you live?: Yes      Need for Family Participation in Patient Care: Yes (Comment) Care giver support system in place?: Yes (comment) (husband)   Criminal Activity/Legal Involvement Pertinent to Current Situation/Hospitalization: No - Comment as needed  Activities of Daily Living      Permission Sought/Granted Permission sought to share information with : Case Manager, Family  Supports Permission granted to share information with : Yes, Verbal Permission Granted  Share Information with NAME: Hannahmarie Asberry     Permission granted to share info w Relationship: husband  Permission granted to share info w Contact Information: [phone]  Emotional Assessment Appearance:: Appears stated age Attitude/Demeanor/Rapport: Unable to Assess, Sedated Affect (typically observed): Unable to Assess   Alcohol / Substance Use: Not Applicable Psych Involvement: No (comment)  Admission diagnosis:  Angioedema [T78.3XXA] Angioedema, initial encounter [T78.3XXA] Severe tongue swelling [R22.0] Patient Active Problem List   Diagnosis Date Noted   Angioedema 04/17/2021   Dizziness 08/23/2020   Fatigue 08/23/2020   Rib fracture 08/23/2020   Elevated fasting glucose 12/17/2019   Encounter for hepatitis C screening test for low risk patient 12/17/2019   Medicare annual wellness visit, subsequent 08/28/2018   Cough 01/28/2018   Welcome to Medicare preventive visit 08/22/2016   Obesity 10/05/2014   Colon cancer screening 03/03/2013   Hypokalemia 10/09/2011   Routine general medical examination at a health care facility 10/02/2011   Vitamin D deficiency disease 12/28/2010   ARTHRALGIA 08/19/2007   Hypothyroidism 01/31/2007   Hyperlipidemia 01/31/2007   ANXIETY 01/31/2007   Essential hypertension 01/31/2007   GERD 01/31/2007   PCP:  Judy Pimple, MD Pharmacy:   [id number] - [address], [place] - [address] [place] [address] Phone: [phone] Fax:  [phone]  [facility]) - [place], [place] - [address] [facility] [address] [place] [address] [place] [address] Phone: [phone] Fax: [phone]  [facility] ([facility] ) - [place], [place] - [address] [place] [address] Phone: [phone] Fax: [phone]     Social Determinants  of Health (SDOH) Interventions    Readmission Risk Interventions No flowsheet data found.

## 2021-04-20 NOTE — Progress Notes (Signed)
NAME:  Sharon [name], MRN:  [id number], DOB:  [birth year], LOS: 3 ADMISSION DATE:  04/17/2021, CONSULTATION DATE: 04/17/2021 REFERRING MD: Dr. Adaline Sill, CHIEF COMPLAINT: Oral swelling  Brief Pt Description/Synopsis:  70 y.o. female admitted with Acute Respiratory Failure secondary to airway compromise from angioedema in the setting of suspected allergic reaction from ARB requiring intubation for airway protection.   History of Present Illness:  70 year old female presenting to [facility] ED on 04/17/2021 from home with her husband.  (History obtained from husband who is bedside and ED documentation) Patient was at her son's for holiday dinner eating some ham and mashed potatoes, per husband there was nothing unusual at dinner.  She reported biting her tongue, with mild swelling. Once back at the house, she laid down for a nap and realized she was having trouble breathing when supine & they both noticed the swelling in her tongue was worsening, now including the right side of her face.  They became concerned, and drove to the ED for evaluation.  Patient's husband denies anything like this happening before in their 15 years of marriage.  He is not sure but does not believe his son cooks with any unusual type of oil, i.e. peanut oil.  Of note, upon chart review, in the patient's medication list she is prescribed losartan.  Husband denies anything out of the ordinary in the week prior, denying that his wife complained of any pain/bleeding/shortness of breath/fever or chills. ED course: EDP noted swelling of the floor of her mouth, mild swelling to her eyelids and face.  Due to concern for airway protection the patient and her husband were consented for urgent rapid sequence intubation and mechanical ventilatory support.  Dr. Jayme Cloud assisted in the mechanical intubation. Medications given: C1 esterase, Decadron, ketamine, rocuronium, tranexamic acid Initial Vitals: Afebrile 98.6, RR 20, tachycardic at 138,  hypertensive at 186/114 and SPO2 92% on RA Significant labs: (Labs/ Imaging personally reviewed) I, Cheryll Cockayne Rust-Chester, AGACNP-BC, personally viewed and interpreted this ECG. EKG Interpretation: Date: 04/17/21, EKG Time: 17:23, Rate: 87, Rhythm: NSR, QRS Axis:  normal, Intervals: normal, ST/T Wave abnormalities: none, Narrative Interpretation: NSR Chemistry: Initial BMP WNL except for Cr which was slightly elevated at 1.06 and has since corrected to 0.99. Hematology: WBC: 7.6 > 10.6, Hgb: 13> 11.9,  COVID-19 & Influenza A/B: negative ABG: 7.41/ 36/130/22.8  All other labs WNL CXR 04/17/21: Patchy perihilar opacity  PCCM consulted for admission.  Pertinent  Medical History  Mitral valve prolapse (1980s) Hyperlipidemia Hypertension Hypothyroidism Remote smoking history (26 years) Vitamin D deficiency Significant Hospital Events: Including procedures, antibiotic start and stop dates in addition to other pertinent events   04/17/2021: Admit to ICU due to angioedema requiring urgent RSI and mechanical ventilatory support 04/18/2021: swelling slowly improving. Will keep on vent today, will check cuff leak tomorrow 04/19/2021: Small cuff leak present but still some swelling.  Will keep intubated today.  New leukocytosis, but PCT is negative, afebrile, and no appreciable secretions from ETT.  CXR with bibasilar opacities (atelectasis vs infection).  Low threshold to start ABX. 04/20/2021: Cuff leak present, swelling improved, tolerating SBT.  Plan for Extubation.  Leukocytosis resolved, PCT negative x2, afebrile.  However increased thick creamy secretions from ETT ~ will culture with low threshold to start ABX.  Interim History / Subjective:  -No signficant events noted overnight -Afebrile, hemodynamically stable, Neosynephrine weaned off -Cuff leak present, swelling improved, tolerating SBT ~ Plan for Extubation  -Leukocytosis resolved, PCT negative x2, afebrile.   -  However increased  thick creamy secretions from ETT ~ will culture with low threshold to start ABX. -Creatinine normalized today, UOP 1.8L last 24 hrs (net +5L since admit)  Objective   Blood pressure 135/74, pulse 99, temperature 99.1 F (37.3 C), resp. rate 15, height 5' 5.98" (1.676 m), weight 104.4 kg, SpO2 92 %.    Vent Mode: PRVC FiO2 (%):  [30 %-35 %] 30 % Set Rate:  [14 bmp] 14 bmp Vt Set:  [480 mL] 480 mL PEEP:  [5 cmH20] 5 cmH20 Plateau Pressure:  [19 cmH20] 19 cmH20   Intake/Output Summary (Last 24 hours) at 04/20/2021 1117 Last data filed at 04/20/2021 0900 Gross per 24 hour  Intake 3584.27 ml  Output 1775 ml  Net 1809.27 ml    Filed Weights   04/17/21 2020 04/18/21 0500 04/19/21 0500  Weight: 99.4 kg 99.5 kg 104.4 kg    Examination: General: Adult female, acutely ill appearing, lying in bed intubated & sedated requiring mechanical ventilation, NAD HEENT: MM pink/moist, anicteric, atraumatic, neck supple, orally intubated Neuro: Sedated (RASS -1), arouses to voice and follows commands, no focal deficits, unable to assess orientation due to ETT, PERRL +3  CV: s1s2 RRR, NSR-ST on monitor, no r/m/g Pulm: Clear diminished breath sounds bilaterally, synchronous with the vent, even, nonlabored GI: soft, rounded, nontender, no guarding or rebound tenderness, bs x 4 GU: foley in place with clear yellow urine Skin: no rashes/lesions noted Extremities: warm/dry, pulses + 2 R/P, +3 edema noted bilateral ankles  Resolved Hospital Problem list     Assessment & Plan:   Acute Respiratory Failure secondary to airway compromise from angioedema in the setting of suspected allergic reaction from ARB PMHx: remote smoking history C1 esterase inhibitor given, 10 mg decadron - Ventilator settings: PRVC  8 mL/kg, continue ventilator support & lung protective strategies - Wean PEEP & FiO2 as tolerated, maintain SpO2 > 90% - Head of bed elevated 30 degrees, VAP protocol in place - Plateau pressures  less than 30 cm H20  - Intermittent chest x-ray & ABG PRN - Daily WUA with SBT as tolerated  - Ensure adequate pulmonary hygiene  - Steroids initiated: decadron 4 mg Q 6 h - famotidine IV, benadryl Q 8 h - Bronchodilators Q 6 - daily cuff leak assessment - PAD protocol in place: continue Fentanyl drip & Propofol drip -Serum IgE, C3 & C4 complement, C1 esterase inhibtor pending -Dr. Jayme Cloud recommends outpatient follow up with Dr. Lucie Leather with [facility] in [facility] Leukocytosis ~ Improved -Monitor fever curve -Trend WBC's & Procalcitonin -Follow cultures as above -CXR 12/27 with Low lung volumes with increased bibasilar opacities, atelectasis versus infection -Pt is afebrile and procalcitonin is negative x2, Leukocytosis is improved.  There are increased creamy secretions from ETT. Will culture.  Will hold off on empiric ABX for now and continue to follow with low threshold to begin ABX    AKI ~ improving -Monitor I&O's / urinary output -Follow BMP -Ensure adequate renal perfusion -Avoid nephrotoxic agents as able -Replace electrolytes as indicated -IV fluids  Hypothyroidism -TSH increased at 6.255 ~  dose of Synthroid to 112 mcg  Hypotension, suspect sedation related PMHx: Hypertension Hyperlipidemia -Continuous cardiac monitoring -Maintain MAP >65 ~ weaned off Neo 12/28 -IVF -Vasopressors as needed to maintain MAP goal - outpatient losartan on hold due to sedation & suspected allergic reaction, added to allergy list, will need alternative agent once extubated - consider restarting home simvastatin as patient stabilizes  Sedation needs  in setting of mechanical ventilation PMHx: Anxiety -Maintain a RASS goal of 0 to -1 -Fentanyl and Precedex as needed to maintain RASS goal -Avoid sedating medications as able -Daily wake up assessment - outpatient regimen on hold, consider restarting bupropion & sertraline as patient stabilizes  Best Practice (right  click and "Reselect all SmartList Selections" daily)  Diet/type: NPO w/ meds via tube, tube feeds DVT prophylaxis: LMWH GI prophylaxis: H2B Lines: N/A Foley:  Yes, and it is still needed Code Status:  full code Last date of multidisciplinary goals of care discussion [04/20/21]    Labs   CBC: Recent Labs  Lab 04/17/21 1728 04/17/21 1928 04/18/21 0627 04/19/21 0438 04/20/21 0456  WBC 7.6 10.6* 10.3 19.7*   19.8* 9.8  NEUTROABS 4.2  --   --  16.7*  --   HGB 13.0 11.9* 11.6* 11.3*   11.4* 9.7*  HCT 39.9 36.2 35.2* 35.7*   34.9* 31.0*  MCV 97.8 97.6 97.0 102.0*   100.9* 100.3*  PLT 225 224 265 164   214 218     Basic Metabolic Panel: Recent Labs  Lab 04/17/21 1728 04/17/21 1928 04/18/21 0440 04/19/21 0438 04/20/21 0456  NA 135  --  134* 139 142  K 3.6  --  4.1 4.0 4.3  CL 100  --  105 105 109  CO2 29  --  20* 25 27  GLUCOSE 108*  --  150* 168* 152*  BUN 19  --  17 35* 37*  CREATININE 1.06* 0.99 1.04* 1.38* 0.84  CALCIUM 9.5  --  8.7* 8.8* 8.9  MG  --  1.9 2.4 2.1 2.6*  PHOS  --  3.0 4.1 4.2 2.6    GFR: Estimated Creatinine Clearance: 76 mL/min (by C-G formula based on SCr of 0.84 mg/dL). Recent Labs  Lab 04/17/21 1928 04/18/21 0627 04/19/21 0438 04/20/21 0456  PROCALCITON  --   --  <0.10 <0.10  WBC 10.6* 10.3 19.7*   19.8* 9.8     Liver Function Tests: Recent Labs  Lab 04/17/21 1728 04/19/21 0438  AST 20  --   ALT 11  --   ALKPHOS 67  --   BILITOT 0.7  --   PROT 7.8  --   ALBUMIN 4.1 3.5    No results for input(s): LIPASE, AMYLASE in the last 168 hours. No results for input(s): AMMONIA in the last 168 hours.  ABG    Component Value Date/Time   PHART 7.41 04/17/2021 1817   PCO2ART 36 04/17/2021 1817   PO2ART 130 (H) 04/17/2021 1817   HCO3 22.8 04/17/2021 1817   ACIDBASEDEF 1.4 04/17/2021 1817   O2SAT 99.0 04/17/2021 1817      Coagulation Profile: Recent Labs  Lab 04/17/21 1728  INR 0.9     Cardiac Enzymes: No results for  input(s): CKTOTAL, CKMB, CKMBINDEX, TROPONINI in the last 168 hours.  HbA1C: No results found for: HGBA1C  CBG: Recent Labs  Lab 04/19/21 1121 04/19/21 1518 04/19/21 1926 04/19/21 2339 04/20/21 0722  GLUCAP 151* 136* 146* 163* 130*     Review of Systems:   UTA- patient intubated requiring mechanical ventilatory support.  Past Medical History:  She,  has a past medical history of Anxiety, Cataract, GERD (gastroesophageal reflux disease), Hyperlipidemia, Hypertension, Hypothyroidism, and Vitamin D deficiency.   Surgical History:   Past Surgical History:  Procedure Laterality Date   CATARACT EXTRACTION W/ INTRAOCULAR LENS IMPLANT Right 12/29/2016   Dr. Mia Creek   CATARACT EXTRACTION W/ INTRAOCULAR LENS  IMPLANT Left 01/15/2017   Dr. Mia Creek   COLONOSCOPY     LEEP     TUBAL LIGATION     VEIN LIGATION AND STRIPPING     x2     Social History:   reports that she quit smoking about 25 years ago. She has never used smokeless tobacco. She reports current alcohol use. She reports that she does not use drugs.   Family History:  Her family history includes Hypertension in her father and mother; Transient ischemic attack in her mother. There is no history of Colon cancer, Esophageal cancer, Rectal cancer, or Stomach cancer.   Allergies Allergies  Allergen Reactions   Cozaar [Losartan] Swelling   Amlodipine Besylate     REACTION: edema   Atorvastatin     REACTION: muscle pain   Neomycin-Bacitracin Zn-Polymyx     REACTION: rash Per pt not sure if from meds or not     Home Medications  Prior to Admission medications   Medication Sig Start Date End Date Taking? Authorizing Provider  aspirin 81 MG tablet Take 81 mg by mouth daily.    [provider]  buPROPion (WELLBUTRIN XL) 150 MG 24 hr tablet TAKE 1 TABLET BY MOUTH  DAILY 01/10/21   Tower, Audrie Gallus, MD  calcium carbonate (TUMS EX) 750 MG chewable tablet Chew 2 tablets by mouth at bedtime.    [provider]  Cholecalciferol (VITAMIN D) 1000 UNITS capsule Take 2,000 Units by mouth daily.     [provider]  fluticasone (FLONASE) 50 MCG/ACT nasal spray USE 2 SPRAYS IN BOTH  NOSTRILS DAILY 10/19/20   Tower, Audrie Gallus, MD  hydrochlorothiazide (HYDRODIURIL) 50 MG tablet TAKE 1 TABLET BY MOUTH  DAILY 01/10/21   Tower, Audrie Gallus, MD  levothyroxine (SYNTHROID) 50 MCG tablet TAKE 1 TABLET BY MOUTH  DAILY BEFORE BREAKFAST 01/10/21   Tower, Audrie Gallus, MD  losartan (COZAAR) 100 MG tablet TAKE 1 TABLET BY MOUTH  DAILY 12/20/20   Tower, Audrie Gallus, MD  pantoprazole (PROTONIX) 40 MG tablet TAKE 1 TABLET BY MOUTH  DAILY 01/10/21   Tower, Audrie Gallus, MD  potassium chloride (KLOR-CON) 10 MEQ tablet Take 1 tablet (10 mEq total) by mouth daily. 04/11/21   Tower, Audrie Gallus, MD  PROAIR HFA 108 [phone] Base) MCG/ACT inhaler TAKE 2 PUFFS BY MOUTH EVERY 6 HOURS AS NEEDED FOR WHEEZE OR SHORTNESS OF BREATH 02/20/18   Tower, Audrie Gallus, MD  sertraline (ZOLOFT) 100 MG tablet TAKE 1 TABLET BY MOUTH  DAILY 01/10/21   Tower, Audrie Gallus, MD  simvastatin (ZOCOR) 80 MG tablet TAKE 1 TABLET BY MOUTH  DAILY 01/10/21   Tower, Audrie Gallus, MD     Critical care time: 40 minutes     Harlon Ditty, AGACNP-BC [facility] Prefer epic messenger for cross cover needs If after hours, please call E-link

## 2021-04-20 NOTE — Consult Note (Signed)
Pharmacy Antibiotic Note  Sharon [name] is a 70 y.o. female with PMH of anxiety, GERD, HLD, HTN. Hypothyroidism, vit D deficiency who was admitted on 04/17/2021 with  angioedema .  Pharmacy has been consulted for vancomycin and cefepime dosing for HCAP.  Afeb, WBC 19.8>9.8, procal<0.1  Resp culture w/ abundant GPC in pairs and clusters and moderate GNR  Plan: Cefepime --Will start cefepime 2g IV q8h  Vancomycin  --Will give vancomycin 2000mg  LD x1 --Will start vancomycin 1250mg  IV q24h following the LD --Est AUC 453.3, Css min 10.2 --Scr used: 0.84, IBW, Vd: 0.5 --Will obtain vancomycin levels around 4th or 5th dose if continued   Height: 5' 5.98" (167.6 cm) Weight: 104.4 kg (230 lb 2.6 oz) IBW/kg (Calculated) : 59.26  Temp (24hrs), Avg:99 F (37.2 C), Min:98.2 F (36.8 C), Max:99.7 F (37.6 C)  Recent Labs  Lab 04/17/21 1728 04/17/21 1928 04/18/21 0440 04/18/21 0627 04/19/21 0438 04/20/21 0456  WBC 7.6 10.6*  --  10.3 19.7*   19.8* 9.8  CREATININE 1.06* 0.99 1.04*  --  1.38* 0.84    Estimated Creatinine Clearance: 76 mL/min (by C-G formula based on SCr of 0.84 mg/dL).    Allergies  Allergen Reactions   Cozaar [Losartan] Swelling   Amlodipine Besylate     REACTION: edema   Atorvastatin     REACTION: muscle pain   Neomycin-Bacitracin Zn-Polymyx     REACTION: rash Per pt not sure if from meds or not    Antimicrobials this admission: Vancomycin 12/28 >>  Cefepime 12/28 >>   Microbiology results: 12/28 Resp culture: bundant GPC in pairs and clusters and moderate GNR 12/25 MRSA PCR: not detected   Thank you for allowing pharmacy to be a part of this patients care.  1/29, PharmD Pharmacy Resident  04/20/2021 2:33 PM

## 2021-04-20 NOTE — Progress Notes (Signed)
Nutrition Follow-up  DOCUMENTATION CODES:   Obesity unspecified  INTERVENTION:   -D/c Vital High Protein and Prosource due to lack of feeding access -RD will follow for diet advancement and add supplements as appropriate  NUTRITION DIAGNOSIS:   Inadequate oral intake related to inability to eat (pt sedated and ventilated) as evidenced by NPO status.  Ongoing  GOAL:   Patient will meet greater than or equal to 90% of their needs  Unmet  MONITOR:   Diet advancement, Labs, Weight trends, Skin, I & O's, PO intake, Supplement acceptance  REASON FOR ASSESSMENT:   Consult Enteral/tube feeding initiation and management  ASSESSMENT:   70 y/o female with h/o HTN, GERD, anxiety and HLD who is admitted with angioedema.  12/28- extubated  Reviewed I/O's: +2.1 L x 24 hours and +7.2 L since admission  UOP: 1.8 L x 24 hours  Case discussed with RN, MD, and during ICU rounds.   Per RN, pt has improved today and swelling has gone down. NGT has been removed. Hopeful that pt will be able to pass swallow evaluation for initiation of PO diet.    Medications reviewed and include decadron, colace, and miralax.   Labs reviewed: CBGS: 123-163 (inpatient orders for glycemic control are none).    Diet Order:   Diet Order             Diet NPO time specified  Diet effective now                   EDUCATION NEEDS:   No education needs have been identified at this time  Skin:  Skin Assessment: Reviewed RN Assessment  Last BM:  Unknown  Height:   Ht Readings from Last 1 Encounters:  04/17/21 5' 5.98" (1.676 m)    Weight:   Wt Readings from Last 1 Encounters:  04/19/21 104.4 kg    Ideal Body Weight:  59 kg  BMI:  Body mass index is 37.17 kg/m.  Estimated Nutritional Needs:   Kcal:  1750-1950  Protein:  90-105 grams  Fluid:  > 1.7 L    Levada Schilling, RD, LDN, CDCES Registered Dietitian II Certified Diabetes Care and Education Specialist Please refer to  [facility] for RD and/or RD on-call/weekend/after hours pager

## 2021-04-20 NOTE — Consult Note (Signed)
PHARMACY CONSULT NOTE - FOLLOW UP  Pharmacy Consult for Electrolyte Monitoring and Replacement   Recent Labs: Potassium (mmol/L)  Date Value  04/20/2021 4.3   Magnesium (mg/dL)  Date Value  02/07/5101 2.6 (H)   Calcium (mg/dL)  Date Value  58/52/7782 8.9   Albumin (g/dL)  Date Value  42/35/3614 3.5   Phosphorus (mg/dL)  Date Value  43/15/4008 2.6   Sodium (mmol/L)  Date Value  04/20/2021 142     Assessment: 70 y/o female with h/o HTN, GERD, anxiety and HLD who is admitted with angioedema. Pharmacy is asked to follow and replace electrolytes while the patient is in the CCU  Nutrition:    Vital HP 52ml/hr + ProSource 28ml TID via tube    Free water flushes 72ml q4 hours to maintain tube patency   MIVF: LR @75mL /hr  Labs Na 139>142 K 4.0>4.3 Phos 4.2>2.6 Mg 2.1>2.6  Goal of Therapy:  Electrolytes WNL  Plan:  --No replacement is currently indicated  --Will continue to monitor and replace electrolytes as clinically indicated   , PharmD Pharmacy Resident  04/20/2021 7:21 AM

## 2021-04-20 NOTE — Progress Notes (Signed)
Pt. Extubated to 6lnc. No apparent distress noted at this time.

## 2021-04-21 DIAGNOSIS — J9601 Acute respiratory failure with hypoxia: Secondary | ICD-10-CM

## 2021-04-21 LAB — BASIC METABOLIC PANEL
Anion gap: 8 (ref 5–15)
BUN: 31 mg/dL — ABNORMAL HIGH (ref 8–23)
CO2: 27 mmol/L (ref 22–32)
Calcium: 8.9 mg/dL (ref 8.9–10.3)
Chloride: 106 mmol/L (ref 98–111)
Creatinine, Ser: 0.62 mg/dL (ref 0.44–1.00)
GFR, Estimated: >60 mL/min (ref >60)
Glucose, Bld: 152 mg/dL — ABNORMAL HIGH (ref 70–99)
Potassium: 3.8 mmol/L (ref 3.5–5.1)
Sodium: 141 mmol/L (ref 135–145)

## 2021-04-21 LAB — IGE: IgE (Immunoglobulin E), Serum: 49 [IU]/mL (ref 6–495)

## 2021-04-21 LAB — CBC
HCT: 32.8 % — ABNORMAL LOW (ref 36.0–46.0)
Hemoglobin: 10.6 g/dL — ABNORMAL LOW (ref 12.0–15.0)
MCH: 32.2 pg (ref 26.0–34.0)
MCHC: 32.3 g/dL (ref 30.0–36.0)
MCV: 99.7 fL (ref 80.0–100.0)
Platelets: 220 10*3/uL (ref 150–400)
RBC: 3.29 MIL/uL — ABNORMAL LOW (ref 3.87–5.11)
RDW: 13.7 % (ref 11.5–15.5)
WBC: 9.2 10*3/uL (ref 4.0–10.5)
nRBC: 0 % (ref 0.0–0.2)

## 2021-04-21 LAB — GLUCOSE, CAPILLARY
Glucose-Capillary: 134 mg/dL — ABNORMAL HIGH (ref 70–99)
Glucose-Capillary: 129 mg/dL — ABNORMAL HIGH (ref 70–99)
Glucose-Capillary: 126 mg/dL — ABNORMAL HIGH (ref 70–99)
Glucose-Capillary: 117 mg/dL — ABNORMAL HIGH (ref 70–99)

## 2021-04-21 LAB — PHOSPHORUS: Phosphorus: 2.6 mg/dL (ref 2.5–4.6)

## 2021-04-21 LAB — PROCALCITONIN: Procalcitonin: <0.1 ng/mL

## 2021-04-21 LAB — MAGNESIUM: Magnesium: 2.5 mg/dL — ABNORMAL HIGH (ref 1.7–2.4)

## 2021-04-21 LAB — TRIGLYCERIDES: Triglycerides: 223 mg/dL — ABNORMAL HIGH (ref <150)

## 2021-04-21 MED ORDER — ADULT MULTIVITAMIN W/MINERALS CH
1.0000 | ORAL_TABLET | Freq: Every day | ORAL | Status: DC
  Administered 2021-04-21 – 2021-04-25 (×4): 1 via ORAL
  Filled 2021-04-21 (×4): qty 1

## 2021-04-21 MED ORDER — HYDRALAZINE HCL 20 MG/ML IJ SOLN: 10.0000 mg | INTRAMUSCULAR | Status: DC | PRN

## 2021-04-21 MED ORDER — ENSURE ENLIVE PO LIQD
237.0000 mL | Freq: Three times a day (TID) | ORAL | Status: DC
  Administered 2021-04-21 – 2021-04-25 (×8): 237 mL via ORAL

## 2021-04-21 MED ORDER — SERTRALINE HCL 50 MG PO TABS
100.0000 mg | ORAL_TABLET | Freq: Every day | ORAL | Status: DC
  Administered 2021-04-21 – 2021-04-25 (×5): 100 mg via ORAL
  Filled 2021-04-21 (×5): qty 2

## 2021-04-21 MED ORDER — PANTOPRAZOLE SODIUM 40 MG PO TBEC
40.0000 mg | DELAYED_RELEASE_TABLET | Freq: Every day | ORAL | Status: DC
  Administered 2021-04-21 – 2021-04-25 (×5): 40 mg via ORAL
  Filled 2021-04-21 (×5): qty 1

## 2021-04-21 MED ORDER — DEXAMETHASONE SODIUM PHOSPHATE 4 MG/ML IJ SOLN
4.0000 mg | Freq: Three times a day (TID) | INTRAMUSCULAR | Status: DC
  Administered 2021-04-21 – 2021-04-22 (×4): 4 mg via INTRAVENOUS
  Filled 2021-04-21 (×4): qty 1

## 2021-04-21 MED ORDER — HYDRALAZINE HCL 50 MG PO TABS
25.0000 mg | ORAL_TABLET | Freq: Three times a day (TID) | ORAL | Status: DC
  Administered 2021-04-21 – 2021-04-25 (×13): 25 mg via ORAL
  Filled 2021-04-21 (×13): qty 1

## 2021-04-21 MED ORDER — ALPRAZOLAM 0.5 MG PO TABS
0.5000 mg | ORAL_TABLET | Freq: Two times a day (BID) | ORAL | Status: DC | PRN
  Filled 2021-04-21: qty 1

## 2021-04-21 MED ORDER — ALBUTEROL SULFATE (2.5 MG/3ML) 0.083% IN NEBU
2.5000 mg | INHALATION_SOLUTION | Freq: Four times a day (QID) | RESPIRATORY_TRACT | Status: DC
  Administered 2021-04-21 – 2021-04-23 (×8): 2.5 mg via RESPIRATORY_TRACT
  Filled 2021-04-21 (×7): qty 3

## 2021-04-21 NOTE — Evaluation (Addendum)
Clinical/Bedside Swallow Evaluation Patient Details  Name: Sharon [name] MRN: [id number] Date of Birth: [birth year]  Today's Date: 04/21/2021 Time: SLP Start Time (ACUTE ONLY): 0830 SLP Stop Time (ACUTE ONLY): 0903 SLP Time Calculation (min) (ACUTE ONLY): 33 min  Past Medical History:  Past Medical History:  Diagnosis Date   Anxiety    Cataract    GERD (gastroesophageal reflux disease)    Hyperlipidemia    Hypertension    Hypothyroidism    Vitamin D deficiency    Past Surgical History:  Past Surgical History:  Procedure Laterality Date   CATARACT EXTRACTION W/ INTRAOCULAR LENS IMPLANT Right 12/29/2016   Dr. Mia Creek   CATARACT EXTRACTION W/ INTRAOCULAR LENS IMPLANT Left 01/15/2017   Dr. Mia Creek   COLONOSCOPY     LEEP     TUBAL LIGATION     VEIN LIGATION AND STRIPPING     x2   HPI:  Per Physician's H&P "70 year old female presenting to [facility] ED on 04/17/2021 from home with her husband.  (History obtained from husband who is bedside and ED documentation)  Patient was at her son's for holiday dinner eating some ham and mashed potatoes, per husband there was nothing unusual at dinner.  She reported biting her tongue, with mild swelling. Once back at the house, she laid down for a nap and realized she was having trouble breathing when supine & they both noticed the swelling in her tongue was worsening, now including the right side of her face.  They became concerned, and drove to the ED for evaluation.  Patient's husband denies anything like this happening before in their 23 years of marriage.  He is not sure but does not believe his son cooks with any unusual type of oil, i.e. peanut oil.  Of note, upon chart review, in the patient's medication list she is prescribed losartan.  Husband denies anything out of the ordinary in the week prior, denying that his wife complained of any pain/bleeding/shortness of breath/fever or chills.  ED course:  EDP noted swelling of the floor  of her mouth, mild swelling to her eyelids and face.  Due to concern for airway protection the patient and her husband were consented for urgent rapid sequence intubation and mechanical ventilatory support.  Dr. Jayme Cloud assisted in the mechanical intubation."   CXR 04/20/21 "1. Stable support devices. 2. Low lung volumes, with stable bibasilar consolidation consistent with atelectasis or airspace disease."   Assessment / Plan / Recommendation  Clinical Impression  Pt seen for clinical swallowing evaluation. Pt alert, pleasant, and cooperative. Oriented to self. Confusion noted. Cleared with RN. RN present for majority of evaluation.  Oral motor examination significant for mildly hoarse vocal quality. Adequate dentition with some missing molars noted. Pt currently on 3L/min O2 via [place].   Pt demonstrated s/sx mild oral dysphagia c/b mildly prolonged mastication with select solid. Pt noting "it's so dry." Oral phase was functional with  Pharyngeal swallow appeared functional per clinical assessment. To palpation, seemingly timely swallow initiation and seemingly adequate laryngeal elevation. No appreciable change to vocal quality noted. However, pt with inconsistent/vague complaint of odynophagia/swallow discomfort with select solid. Again stating, "it's so dry."  Observed pt taking x1 pill with water administered by RN. Pt without observed s/sx pharyngeal dysphagia.   Recommend initiation of a mech soft diet with thin liquids. Extra gravies, sauces, condiments to moisten food. Safe swallowing strategies/aspiration precautions as outlined below. Meds whole with water or with puree, as tolerated.   Pt is  at mildly increased risk for aspiration/aspiration PNA given recent intubation, respiratory status, and multiple medical comorbidities. Risk appears to reduce with diet modifications and safe swallowing strategies/aspiration precautions.   SLP to f/u per POC for diet tolerance and trials of upgraded  textures.   Pt and RN made aware of diet recommendations, safe swallowing strategies/aspiration precautions, and SLP POC. Pt verbalized understanding/agreement; however, suspect need for reinforcement of content given AMS. Communication board in pt's room updated with diet recommendations and safe swallowing strategies/aspiration precautions.     Aspiration Risk  Mild aspiration risk    Diet Recommendation Dysphagia 3 (Mech soft);Thin liquid   Medication Administration: Whole meds with liquid (or with puree, as tolerated) Compensations: Minimize environmental distractions;Slow rate;Small sips/bites Postural Changes: Seated upright at 90 degrees;Remain upright for at least 30 minutes after po intake    Other  Recommendations Oral Care Recommendations: Oral care BID    Recommendations for follow up therapy are one component of a multi-disciplinary discharge planning process, led by the attending physician.  Recommendations may be updated based on patient status, additional functional criteria and insurance authorization.  Follow up Recommendations  (TBD)      Assistance Recommended at Discharge Frequent or constant Supervision/Assistance  Functional Status Assessment Patient has had a recent decline in their functional status and demonstrates the ability to make significant improvements in function in a reasonable and predictable amount of time.  Frequency and Duration min 2x/week  2 weeks       Prognosis Prognosis for Safe Diet Advancement: Fair      Swallow Study   General Date of Onset: 04/17/21 HPI: Per Physician's H&P "70 year old female presenting to [facility] ED on 04/17/2021 from home with her husband.  (History obtained from husband who is bedside and ED documentation)  Patient was at her son's for holiday dinner eating some ham and mashed potatoes, per husband there was nothing unusual at dinner.  She reported biting her tongue, with mild swelling. Once back at the house, she  laid down for a nap and realized she was having trouble breathing when supine & they both noticed the swelling in her tongue was worsening, now including the right side of her face.  They became concerned, and drove to the ED for evaluation.  Patient's husband denies anything like this happening before in their 25 years of marriage.  He is not sure but does not believe his son cooks with any unusual type of oil, i.e. peanut oil.  Of note, upon chart review, in the patient's medication list she is prescribed losartan.  Husband denies anything out of the ordinary in the week prior, denying that his wife complained of any pain/bleeding/shortness of breath/fever or chills.  ED course:  EDP noted swelling of the floor of her mouth, mild swelling to her eyelids and face.  Due to concern for airway protection the patient and her husband were consented for urgent rapid sequence intubation and mechanical ventilatory support.  Dr. Jayme Cloud assisted in the mechanical intubation." Type of Study: Bedside Swallow Evaluation Previous Swallow Assessment: unknown Diet Prior to this Study: NPO Temperature Spikes Noted: Yes (current temp WNL) Respiratory Status: Nasal cannula (3L/min) History of Recent Intubation: Yes Length of Intubations (days): 4 days Date extubated: 04/20/21 Behavior/Cognition: Alert;Cooperative;Pleasant mood;Confused Oral Cavity Assessment: Within Functional Limits Oral Care Completed by SLP: Yes Oral Cavity - Dentition: Adequate natural dentition (some missing molars) Vision: Functional for self-feeding Self-Feeding Abilities: Able to feed self;Needs set up Patient Positioning: Upright in bed Baseline  Vocal Quality: Hoarse (mildly hoarse; functional) Volitional Cough: Strong Volitional Swallow: Able to elicit    Oral/Motor/Sensory Function Overall Oral Motor/Sensory Function: Within functional limits   Ice Chips Ice chips: Within functional limits Presentation: Spoon Other Comments: x5    Thin Liquid Thin Liquid: Within functional limits Presentation: Cup;Spoon;Straw;Self Fed (self fed with cup/straw) Other Comments: ~6 oz total; presented via single tsp, single cup sips, single and consecutive straw sips    Puree Puree: Within functional limits Presentation: Self Fed;Spoon   Solid     Presentation: Self Fed Oral Phase Impairments: Impaired mastication (mildly prolonged mastication; "it's so dry") Oral Phase Functional Implications: Impaired mastication Pharyngeal Phase Impairments:  (complaint of odynophagia/discomfort x1; "it's so dry") Other Comments: x2 saltines; pt took 3-4 bites per saltine     Clyde Canterbury, M.S., CCC-SLP Speech-Language Pathologist [facility] [phone] (ASCOM)  Alessandra Bevels Bemnet Trovato 04/21/2021,10:41 AM

## 2021-04-21 NOTE — Progress Notes (Signed)
Patient received to unit from ICU in stable condition

## 2021-04-21 NOTE — Consult Note (Signed)
Pharmacy Antibiotic Note  Sharon [name] is a 70 y.o. female with PMH of anxiety, GERD, HLD, HTN. Hypothyroidism, vit D deficiency who was admitted on 04/17/2021 with  angioedema .  Pharmacy has been consulted for vancomycin and cefepime dosing for HCAP.  Afeb, WBC 9.8>9.2, procal<0.1  Resp culture reincubated for better growth, gram stain w/ abundant GPC in pairs and clusters and moderate GNR  Plan: Cefepime --Will continue cefepime 2g IV q8h  Will continue to monitor renal function and adjust regimen as clinically indicated   Height: 5' 5.98" (167.6 cm) Weight: 104.4 kg (230 lb 2.6 oz) IBW/kg (Calculated) : 59.26  Temp (24hrs), Avg:99.5 F (37.5 C), Min:98.4 F (36.9 C), Max:100.4 F (38 C)  Recent Labs  Lab 04/17/21 1928 04/18/21 0440 04/18/21 0627 04/19/21 0438 04/20/21 0456 04/21/21 0443  WBC 10.6*  --  10.3 19.7*   19.8* 9.8 9.2  CREATININE 0.99 1.04*  --  1.38* 0.84 0.62     Estimated Creatinine Clearance: 79.8 mL/min (by C-G formula based on SCr of 0.62 mg/dL).    Allergies  Allergen Reactions   Cozaar [Losartan] Swelling   Amlodipine Besylate     REACTION: edema   Atorvastatin     REACTION: muscle pain   Neomycin-Bacitracin Zn-Polymyx     REACTION: rash Per pt not sure if from meds or not    Antimicrobials this admission: Ongoing Cefepime 12/28 >>   Completed  Vancomycin 12/28 >> 12/29  Microbiology results: 12/28 Resp culture: reincubated for better growth, gram stain w/ abundant GPC in pairs and clusters and moderate GNR 12/25 MRSA PCR: not detected   Thank you for allowing pharmacy to be a part of this patients care.  Doloris Hall, PharmD Pharmacy Resident  04/21/2021 7:27 AM

## 2021-04-21 NOTE — Progress Notes (Signed)
OT Cancellation Note  Patient Details Name: Sharon [name] MRN: [id number] DOB: May 17, 1950   Cancelled Treatment:    Reason Eval/Treat Not Completed: Patient declined, no reason specified;Fatigue/lethargy limiting ability to participate. Consult received, chart reviewed. Pt with spouse and Charity fundraiser. RN noting pt just got back to bed after pivoting from recliner to [facility] for toileting, then return to bed. Pt endorses fatigue and requesting OT return next morning for evaluation.   Arman Filter., MPH, MS, OTR/L ascom [phone] 04/21/21, 3:50 PM

## 2021-04-21 NOTE — Consult Note (Signed)
PHARMACY CONSULT NOTE - FOLLOW UP  Pharmacy Consult for Electrolyte Monitoring and Replacement   Recent Labs: Potassium (mmol/L)  Date Value  04/21/2021 3.8   Magnesium (mg/dL)  Date Value  37/85/8850 2.5 (H)   Calcium (mg/dL)  Date Value  27/74/1287 8.9   Albumin (g/dL)  Date Value  86/76/7209 3.5   Phosphorus (mg/dL)  Date Value  47/12/6281 2.6   Sodium (mmol/L)  Date Value  04/21/2021 141     Assessment: 70 y/o female with h/o HTN, GERD, anxiety and HLD who is admitted with angioedema. Pharmacy is asked to follow and replace electrolytes while the patient is in the CCU  Nutrition:  Ensure enlive/plus TID  Labs Na 142>141 K 4.3>3.8 Phos 4.2>2.6 Mg 2.6>2.5  Goal of Therapy:  Electrolytes WNL  Plan:  --No replacement is currently indicated  --Will continue to monitor and replace electrolytes as clinically indicated   Doloris Hall, PharmD Pharmacy Resident  04/21/2021 7:27 AM

## 2021-04-21 NOTE — Progress Notes (Signed)
PROGRESS NOTE    Sharon [name]  NID:[id number] DOB: 1950/07/23 DOA: 04/17/2021 PCP: Abner Greenspan, MD    Brief Narrative:  70yo who presented with acute respiratory failure secondary to angioedema, requiring mechanical ventilation for airway protection. Pt was extubated and now on medical service  Assessment & Plan:   Principal Problem:   Angioedema   Acute respiratory failure secondary to angioedema Presumed secondary to ARB, avoid further use Have started scheduled hydralazine for BP control for now Advised pt to f/u w/ allergist as outpatient after discharge Cont to wean steroids and wean o2 as tolerated Have consulted PT/OT to eval Leukocytosis Normalized On empiric abx for possible PNA, however pro-cal is neg Likely hold further abx soon ARF Renal function normalized Follow renal panel trends Hypothyroid Continue thyroid replacement as tolerated Hypotension Presumed secondary to sedation while on vent BP now hypertensive Now on hydralazine for BP control Anxiety Have resumed home SSRI Have ordered prn xanax while in hospital  DVT prophylaxis: Lovenox subq Code Status: Full Family Communication: Pt in room, family at bedside  Status is: Inpatient  Remains inpatient appropriate because: severity of illness    Consultants:  PCCM  Procedures:    Antimicrobials: Anti-infectives (From admission, onward)    Start     Dose/Rate Route Frequency Ordered Stop   04/21/21 1600  vancomycin (VANCOREADY) IVPB 1250 mg/250 mL  Status:  Discontinued        1,250 mg 166.7 mL/hr over 90 Minutes Intravenous Every 24 hours 04/20/21 1426 04/21/21 1301   04/20/21 1600  ceFEPIme (MAXIPIME) 2 g in sodium chloride 0.9 % 100 mL IVPB        2 g 200 mL/hr over 30 Minutes Intravenous Every 8 hours 04/20/21 1426     04/20/21 1600  vancomycin (VANCOREADY) IVPB 2000 mg/400 mL        2,000 mg 200 mL/hr over 120 Minutes Intravenous  Once 04/20/21 1426 04/20/21 2030        Subjective: Reports feeling better. Family states pt looks "100%% better  Objective: Vitals:   04/21/21 1400 04/21/21 1500 04/21/21 1600 04/21/21 1700  BP: (!) 161/82 (!) 146/77 136/64 (!) 163/83  Pulse: 81 88 84 63  Resp: (!) 29 19 (!) 22 14  Temp: 99.3 F (37.4 C) 99.3 F (37.4 C) 99.1 F (37.3 C) 99.3 F (37.4 C)  TempSrc:   Bladder   SpO2: 95% 94% 95% 95%  Weight:      Height:        Intake/Output Summary (Last 24 hours) at 04/21/2021 1936 Last data filed at 04/21/2021 1800 Gross per 24 hour  Intake 647.47 ml  Output 1265 ml  Net -617.53 ml   Filed Weights   04/17/21 2020 04/18/21 0500 04/19/21 0500  Weight: 99.4 kg 99.5 kg 104.4 kg    Examination: General exam: Awake, laying in bed, in nad Respiratory system: Normal respiratory effort, no wheezing Cardiovascular system: regular rate, s1, s2 Gastrointestinal system: Soft, nondistended, positive BS Central nervous system: CN2-12 grossly intact, strength intact Extremities: Perfused, no clubbing Skin: Normal skin turgor, no notable skin lesions seen Psychiatry: Mood normal // no visual hallucinations   Data Reviewed: I have personally reviewed following labs and imaging studies  CBC: Recent Labs  Lab 04/17/21 1728 04/17/21 1928 04/18/21 0627 04/19/21 0438 04/20/21 0456 04/21/21 0443  WBC 7.6 10.6* 10.3 19.7*   19.8* 9.8 9.2  NEUTROABS 4.2  --   --  16.7*  --   --   HGB  13.0 11.9* 11.6* 11.3*   11.4* 9.7* 10.6*  HCT 39.9 36.2 35.2* 35.7*   34.9* 31.0* 32.8*  MCV 97.8 97.6 97.0 102.0*   100.9* 100.3* 99.7  PLT 225 224 265 164   214 218 280   Basic Metabolic Panel: Recent Labs  Lab 04/17/21 1728 04/17/21 1928 04/18/21 0440 04/19/21 0438 04/20/21 0456 04/21/21 0443  NA 135  --  134* 139 142 141  K 3.6  --  4.1 4.0 4.3 3.8  CL 100  --  105 105 109 106  CO2 29  --  20* '25 27 27  ' GLUCOSE 108*  --  150* 168* 152* 152*  BUN 19  --  17 35* 37* 31*  CREATININE 1.06* 0.99 1.04* 1.38* 0.84 0.62   CALCIUM 9.5  --  8.7* 8.8* 8.9 8.9  MG  --  1.9 2.4 2.1 2.6* 2.5*  PHOS  --  3.0 4.1 4.2 2.6 2.6   GFR: Estimated Creatinine Clearance: 79.8 mL/min (by C-G formula based on SCr of 0.62 mg/dL). Liver Function Tests: Recent Labs  Lab 04/17/21 1728 04/19/21 0438  AST 20  --   ALT 11  --   ALKPHOS 67  --   BILITOT 0.7  --   PROT 7.8  --   ALBUMIN 4.1 3.5   No results for input(s): LIPASE, AMYLASE in the last 168 hours. No results for input(s): AMMONIA in the last 168 hours. Coagulation Profile: Recent Labs  Lab 04/17/21 1728  INR 0.9   Cardiac Enzymes: No results for input(s): CKTOTAL, CKMB, CKMBINDEX, TROPONINI in the last 168 hours. BNP (last 3 results) No results for input(s): PROBNP in the last 8760 hours. HbA1C: No results for input(s): HGBA1C in the last 72 hours. CBG: Recent Labs  Lab 04/20/21 1538 04/20/21 1929 04/21/21 0715 04/21/21 1124 04/21/21 1634  GLUCAP 111* 116* 134* 129* 126*   Lipid Profile: Recent Labs    04/21/21 0443  TRIG 223*   Thyroid Function Tests: No results for input(s): TSH, T4TOTAL, FREET4, T3FREE, THYROIDAB in the last 72 hours. Anemia Panel: No results for input(s): VITAMINB12, FOLATE, FERRITIN, TIBC, IRON, RETICCTPCT in the last 72 hours. Sepsis Labs: Recent Labs  Lab 04/19/21 0438 04/20/21 0456 04/21/21 0443  PROCALCITON <0.10 <0.10 <0.10    Recent Results (from the past 240 hour(s))  Resp Panel by RT-PCR (Flu A&B, Covid) Nasopharyngeal Swab     Status: None   Collection Time: 04/17/21  5:28 PM   Specimen: Nasopharyngeal Swab; Nasopharyngeal(NP) swabs in vial transport medium  Result Value Ref Range Status   SARS Coronavirus 2 by RT PCR NEGATIVE NEGATIVE Final    Comment: (NOTE) SARS-CoV-2 target nucleic acids are NOT DETECTED.  The SARS-CoV-2 RNA is generally detectable in upper respiratory specimens during the acute phase of infection. The lowest concentration of SARS-CoV-2 viral copies this assay can detect  is 138 copies/mL. A negative result does not preclude SARS-Cov-2 infection and should not be used as the sole basis for treatment or other patient management decisions. A negative result may occur with  improper specimen collection/handling, submission of specimen other than nasopharyngeal swab, presence of viral mutation(s) within the areas targeted by this assay, and inadequate number of viral copies(<138 copies/mL). A negative result must be combined with clinical observations, patient history, and epidemiological information. The expected result is Negative.  Fact Sheet for Patients:  EntrepreneurPulse.com.au  Fact Sheet for Healthcare Providers:  IncredibleEmployment.be  This test is no t yet approved or cleared by the  [place] Architectural technologist and  has been authorized for detection and/or diagnosis of SARS-CoV-2 by FDA under an Print production planner (EUA). This EUA will remain  in effect (meaning this test can be used) for the duration of the COVID-19 declaration under Section 564(b)(1) of the Act, 21 U.S.C.section 360bbb-3(b)(1), unless the authorization is terminated  or revoked sooner.       Influenza A by PCR NEGATIVE NEGATIVE Final   Influenza B by PCR NEGATIVE NEGATIVE Final    Comment: (NOTE) The Xpert Xpress SARS-CoV-2/FLU/RSV plus assay is intended as an aid in the diagnosis of influenza from Nasopharyngeal swab specimens and should not be used as a sole basis for treatment. Nasal washings and aspirates are unacceptable for Xpert Xpress SARS-CoV-2/FLU/RSV testing.  Fact Sheet for Patients: EntrepreneurPulse.com.au  Fact Sheet for Healthcare Providers: IncredibleEmployment.be  This test is not yet approved or cleared by the [place] FDA and has been authorized for detection and/or diagnosis of SARS-CoV-2 by FDA under an Emergency Use Authorization (EUA). This EUA will remain in effect  (meaning this test can be used) for the duration of the COVID-19 declaration under Section 564(b)(1) of the Act, 21 U.S.C. section 360bbb-3(b)(1), unless the authorization is terminated or revoked.  Performed at [facility], [address] [place], [place] [address]   MRSA Next Gen by PCR, Nasal     Status: None   Collection Time: 04/17/21  8:41 PM   Specimen: Nasal Mucosa; Nasal Swab  Result Value Ref Range Status   MRSA by PCR Next Gen NOT DETECTED NOT DETECTED Final    Comment: (NOTE) The GeneXpert MRSA Assay (FDA approved for NASAL specimens only), is one component of a comprehensive MRSA colonization surveillance program. It is not intended to diagnose MRSA infection nor to guide or monitor treatment for MRSA infections. Test performance is not FDA approved in patients less than 76 years old. Performed at [facility], [address] [place], [place] [address]   Culture, Respiratory w Gram Stain     Status: None (Preliminary result)   Collection Time: 04/20/21  8:46 AM   Specimen: Tracheal Aspirate; Respiratory  Result Value Ref Range Status   Specimen Description   Final    TRACHEAL ASPIRATE Performed at [facility], [address] [place], [place] [address]    Special Requests   Final    NONE Performed at [facility], [address] [place], [place] [address]    Gram Stain   Final    ABUNDANT WBC PRESENT,BOTH PMN AND MONONUCLEAR ABUNDANT GRAM POSITIVE COCCI IN PAIRS IN CLUSTERS IN CHAINS MODERATE GRAM NEGATIVE RODS    Culture   Final    CULTURE REINCUBATED FOR BETTER GROWTH Performed at [facility], [place] [address] [place],  [address]    Report Status PENDING  Incomplete     Radiology Studies: DG Chest Port 1 View  Result Date: 04/20/2021 CLINICAL DATA:  Hypoxia, respiratory failure EXAM: PORTABLE CHEST 1 VIEW COMPARISON:  04/19/2021 FINDINGS: Single frontal view of the chest demonstrates stable  endotracheal tube and enteric catheter. Cardiac silhouette is enlarged. Continued low lung volumes, with stable bibasilar consolidation left greater than right. No effusion or pneumothorax. IMPRESSION: 1. Stable support devices. 2. Low lung volumes, with stable bibasilar consolidation consistent with atelectasis or airspace disease. Electronically Signed   By: Randa Ngo M.D.   On: 04/20/2021 02:30    Scheduled Meds:  albuterol  2.5 mg Nebulization Q6H   budesonide (  PULMICORT) nebulizer solution  0.25 mg Nebulization BID   chlorhexidine gluconate (MEDLINE KIT)  15 mL Mouth Rinse BID   Chlorhexidine Gluconate Cloth  6 each Topical Daily   dexamethasone (DECADRON) injection  4 mg Intravenous Q8H   diphenhydrAMINE  12.5 mg Intravenous Q8H   enoxaparin (LOVENOX) injection  0.5 mg/kg Subcutaneous Q24H   feeding supplement  237 mL Oral TID BM   hydrALAZINE  25 mg Oral Q8H   levothyroxine  75 mcg Per Tube Q0600   multivitamin with minerals  1 tablet Oral Daily   pantoprazole  40 mg Oral Daily   sertraline  100 mg Oral Daily   Continuous Infusions:  sodium chloride 10 mL/hr at 04/17/21 1851   sodium chloride 10 mL/hr at 04/20/21 1800   ceFEPime (MAXIPIME) IV Stopped (04/21/21 1612)   famotidine (PEPCID) IV 20 mg (04/21/21 1725)     LOS: 4 days   Marylu Lund, MD [facility] Pager On Amion  If 7PM-7AM, please contact night-coverage 04/21/2021, 7:36 PM

## 2021-04-21 NOTE — Evaluation (Signed)
Physical Therapy Evaluation Patient Details Name: Sharon [name] MRN: [id number] DOB: [birth year] Today's Date: 04/21/2021  History of Present Illness  Pt is a 70 y.o. female admitted with Acute Respiratory Failure secondary to airway compromise from angioedema in the setting of suspected allergic reaction from ARB requiring intubation for airway protection. PMH of cateracts, anxiety, GERD, HLD, HTN.   Clinical Impression  Patient easily wakes, agreeable to PT. Oriented x4, some slow processing noted but able to follow one step commands consistently. The patient reported at baseline she is independent, lives with her husband. Did endorse a few falls in her garden.   The patient was able to R to the L with bed rails, CGA. When rolling to the R, required minA, and modA to come up into sitting. Fair sitting balance noted. Sit <> stand with RW and minA. Able to stand several seconds with CGA, but requiring minA for RW assist to step pivot to recliner in room. spO2 ~89% on O2 via [place], but with rest improved to >90%. HR WFLs. Pt declined any further mobility at this time due to fatigue and presence of lunch.  Overall the patient demonstrated deficits (see "PT Problem List") that impede the patient's functional abilities, safety, and mobility and would benefit from skilled PT intervention. Recommendation is SNF due to current level of assistance needed and decline from PLOF.     Recommendations for follow up therapy are one component of a multi-disciplinary discharge planning process, led by the attending physician.  Recommendations may be updated based on patient status, additional functional criteria and insurance authorization.  Follow Up Recommendations Skilled nursing-short term rehab (<3 hours/day)    Assistance Recommended at Discharge    Functional Status Assessment Patient has had a recent decline in their functional status and demonstrates the ability to make significant improvements in  function in a reasonable and predictable amount of time.  Equipment Recommendations  Other (comment) (TBD at next venue of care)    Recommendations for Other Services OT consult     Precautions / Restrictions Precautions Precautions: Fall Restrictions Weight Bearing Restrictions: No      Mobility  Bed Mobility Overal bed mobility: Needs Assistance Bed Mobility: Rolling;Sidelying to Sit Rolling: Min guard;Min assist Sidelying to sit: Mod assist            Transfers Overall transfer level: Needs assistance Equipment used: Rolling walker (2 wheels) Transfers: Sit to/from Stand;Bed to chair/wheelchair/BSC Sit to Stand: Min assist   Step pivot transfers: Min assist       General transfer comment: assist for RW navigation, controlled descent to recliner    Ambulation/Gait               General Gait Details: pt fatigued after transfer to recliner. declining any further mobility at this time  Information systems manager [place] (Stroke Patients Only)       Balance Overall balance assessment: Needs assistance Sitting-balance support: Feet supported Sitting balance-Leahy Scale: Fair     Standing balance support: Reliant on assistive device for balance Standing balance-Leahy Scale: Poor                               Pertinent Vitals/Pain Pain Assessment: Faces Faces Pain Scale: No hurt    Home Living Family/patient expects to be discharged to:: Private residence Living Arrangements: Spouse/significant other Available Help at  Discharge: Family Type of Home: House Home Access: Stairs to enter   [employer] of Steps: 5, unable to recall which side the rail is on   Home Layout: Two level   Additional Comments: pt does reference falling in her garden often    Prior Function Prior Level of Function : Independent/Modified Independent                     Hand Dominance   Dominant Hand:  Right    Extremity/Trunk Assessment   Upper Extremity Assessment Upper Extremity Assessment: Defer to OT evaluation;Overall [facility] for tasks assessed    Lower Extremity Assessment Lower Extremity Assessment: Generalized weakness    Cervical / Trunk Assessment Cervical / Trunk Assessment: Normal  Communication      Cognition Arousal/Alertness: Awake/alert Behavior During Therapy: WFL for tasks assessed/performed Overall Cognitive Status: No family/caregiver present to determine baseline cognitive functioning                                 General Comments: pt oriented to self, place, situation, year/month        General Comments      Exercises     Assessment/Plan    PT Assessment Patient needs continued PT services  PT Problem List Decreased strength;Decreased mobility;Decreased range of motion;Decreased activity tolerance;Decreased balance;Decreased knowledge of use of DME;Decreased cognition       PT Treatment Interventions DME instruction;Therapeutic exercise;Gait training;Balance training;Stair training;Neuromuscular re-education;Functional mobility training;Therapeutic activities;Patient/family education    PT Goals (Current goals can be found in the Care Plan section)  Acute Rehab PT Goals Patient Stated Goal: to go home PT Goal Formulation: With patient Time For Goal Achievement: 05/05/21 Potential to Achieve Goals: Good    Frequency Min 2X/week   Barriers to discharge        Co-evaluation               AM-PAC PT "6 Clicks" Mobility  Outcome Measure Help needed turning from your back to your side while in a flat bed without using bedrails?: A Little Help needed moving from lying on your back to sitting on the side of a flat bed without using bedrails?: A Lot Help needed moving to and from a bed to a chair (including a wheelchair)?: A Little Help needed standing up from a chair using your arms (e.g., wheelchair or bedside chair)?: A  Little Help needed to walk in hospital room?: A Lot Help needed climbing 3-5 steps with a railing? : Total 6 Click Score: 14    End of Session Equipment Utilized During Treatment: Gait belt;Oxygen Activity Tolerance: Patient tolerated treatment well;Patient limited by fatigue Patient left: in chair;with call bell/phone within reach Nurse Communication: Mobility status PT Visit Diagnosis: Other abnormalities of gait and mobility (R26.89);Difficulty in walking, not elsewhere classified (R26.2);Muscle weakness (generalized) (M62.81)    Time: 4175-3010 PT Time Calculation (min) (ACUTE ONLY): 29 min   Charges:   PT Evaluation $PT Eval Low Complexity: 1 Low PT Treatments $Therapeutic Activity: 23-37 mins        Olga Coaster PT, DPT 2:34 PM,04/21/21

## 2021-04-21 NOTE — Plan of Care (Signed)
Neuro: alert and forgetful, needs reminding, got up to the chair with PT today, used the bedside commode, weak though fairly stable with use of walker Resp: stable on [place] today CV: afebrile, vital signs stable with hydralazine added for hypertension GIGU: foley in place, BM x 1, tolerating PO following speech therapy eval Skin: clean dry and intact Social: husband at bedside throughout the day, all questions and concerns addressed  Events: transfer to room 73  Husband shared today that the patient has had an increase in forgetfulness and signs of dementia for the last 2-3 years. They went through the progress of Alzheimer's with her mother and he states that what he sees in the patient is what they experienced with his mother in law in the beginning stages. The patient has not seen a specialist for her symptoms yet.   Problem: Education: Goal: Knowledge of General Education information will improve Description: Including pain rating scale, medication(s)/side effects and non-pharmacologic comfort measures Outcome: Progressing   Problem: Health Behavior/Discharge Planning: Goal: Ability to manage health-related needs will improve Outcome: Progressing   Problem: Clinical Measurements: Goal: Ability to maintain clinical measurements within normal limits will improve Outcome: Progressing Goal: Will remain free from infection Outcome: Progressing Goal: Diagnostic test results will improve Outcome: Progressing Goal: Respiratory complications will improve Outcome: Progressing Goal: Cardiovascular complication will be avoided Outcome: Progressing   Problem: Activity: Goal: Risk for activity intolerance will decrease Outcome: Progressing   Problem: Nutrition: Goal: Adequate nutrition will be maintained Outcome: Progressing   Problem: Coping: Goal: Level of anxiety will decrease Outcome: Progressing   Problem: Elimination: Goal: Will not experience complications related to bowel  motility Outcome: Progressing Goal: Will not experience complications related to urinary retention Outcome: Progressing   Problem: Pain Managment: Goal: General experience of comfort will improve Outcome: Progressing   Problem: Safety: Goal: Ability to remain free from injury will improve Outcome: Progressing   Problem: Skin Integrity: Goal: Risk for impaired skin integrity will decrease Outcome: Progressing   Problem: Activity: Goal: Ability to tolerate increased activity will improve Outcome: Progressing   Problem: Respiratory: Goal: Ability to maintain a clear airway and adequate ventilation will improve Outcome: Progressing   Problem: Role Relationship: Goal: Method of communication will improve Outcome: Progressing

## 2021-04-22 DIAGNOSIS — J9601 Acute respiratory failure with hypoxia: Secondary | ICD-10-CM | POA: Diagnosis not present

## 2021-04-22 LAB — BASIC METABOLIC PANEL
Anion gap: 6 (ref 5–15)
BUN: 23 mg/dL (ref 8–23)
CO2: 27 mmol/L (ref 22–32)
Calcium: 8.9 mg/dL (ref 8.9–10.3)
Chloride: 103 mmol/L (ref 98–111)
Creatinine, Ser: 0.7 mg/dL (ref 0.44–1.00)
GFR, Estimated: >60 mL/min (ref >60)
Glucose, Bld: 101 mg/dL — ABNORMAL HIGH (ref 70–99)
Potassium: 3.1 mmol/L — ABNORMAL LOW (ref 3.5–5.1)
Sodium: 136 mmol/L (ref 135–145)

## 2021-04-22 LAB — CBC
HCT: 34.1 % — ABNORMAL LOW (ref 36.0–46.0)
Hemoglobin: 11.4 g/dL — ABNORMAL LOW (ref 12.0–15.0)
MCH: 32.6 pg (ref 26.0–34.0)
MCHC: 33.4 g/dL (ref 30.0–36.0)
MCV: 97.4 fL (ref 80.0–100.0)
Platelets: 226 10*3/uL (ref 150–400)
RBC: 3.5 MIL/uL — ABNORMAL LOW (ref 3.87–5.11)
RDW: 13.6 % (ref 11.5–15.5)
WBC: 11.6 10*3/uL — ABNORMAL HIGH (ref 4.0–10.5)
nRBC: 0.2 % (ref 0.0–0.2)

## 2021-04-22 LAB — CULTURE, RESPIRATORY W GRAM STAIN: Culture: NORMAL

## 2021-04-22 LAB — PHOSPHORUS: Phosphorus: 2 mg/dL — ABNORMAL LOW (ref 2.5–4.6)

## 2021-04-22 LAB — MAGNESIUM: Magnesium: 2.2 mg/dL (ref 1.7–2.4)

## 2021-04-22 MED ORDER — DEXAMETHASONE SODIUM PHOSPHATE 4 MG/ML IJ SOLN
4.0000 mg | Freq: Two times a day (BID) | INTRAMUSCULAR | Status: DC
Start: 2021-04-22 — End: 2021-04-23
  Administered 2021-04-22 – 2021-04-23 (×2): 4 mg via INTRAVENOUS
  Filled 2021-04-22 (×2): qty 1

## 2021-04-22 MED ORDER — FAMOTIDINE 20 MG PO TABS
20.0000 mg | ORAL_TABLET | Freq: Two times a day (BID) | ORAL | Status: DC
  Administered 2021-04-22 – 2021-04-25 (×6): 20 mg via ORAL
  Filled 2021-04-22 (×6): qty 1

## 2021-04-22 MED ORDER — DIPHENHYDRAMINE HCL 12.5 MG/5ML PO ELIX
12.5000 mg | ORAL_SOLUTION | Freq: Three times a day (TID) | ORAL | Status: DC
  Administered 2021-04-22 – 2021-04-25 (×9): 12.5 mg via ORAL
  Filled 2021-04-22 (×11): qty 5

## 2021-04-22 MED ORDER — POTASSIUM CHLORIDE CRYS ER 20 MEQ PO TBCR
40.0000 meq | EXTENDED_RELEASE_TABLET | Freq: Four times a day (QID) | ORAL | Status: AC
  Administered 2021-04-22 (×2): 40 meq via ORAL
  Filled 2021-04-22 (×2): qty 2

## 2021-04-22 NOTE — Progress Notes (Signed)
PROGRESS NOTE    Sharon [name]  YFV:[id number] DOB: May 30, 1950 DOA: 04/17/2021 PCP: Abner Greenspan, MD    Brief Narrative:  70yo who presented with acute respiratory failure secondary to angioedema, requiring mechanical ventilation for airway protection. Pt was extubated and now on medical service  Assessment & Plan:   Principal Problem:   Angioedema  Acute respiratory failure secondary to angioedema Presumed secondary to ARB, avoid further use Have started scheduled hydralazine for BP control for now Advised pt to f/u w/ allergist as outpatient after discharge Cont to wean steroids and wean o2 as tolerated Therapy recs for SNF. Have consulted TOC Leukocytosis Normalized On empiric abx for possible PNA, however pro-cal is neg Hold further abx ARF Renal function normalized Cont to follow renal function Hypothyroid Continue thyroid replacement as tolerated TSH 6.255 with normal free T4 Hypotension Presumed secondary to sedation while on vent Now on hydralazine for BP control BP now improved Anxiety Have resumed home SSRI Have ordered prn xanax while in hospital  DVT prophylaxis: Lovenox subq Code Status: Full Family Communication: Pt in room, family not at bedside  Status is: Inpatient  Remains inpatient appropriate because: severity of illness    Consultants:  PCCM  Procedures:    Antimicrobials: Anti-infectives (From admission, onward)    Start     Dose/Rate Route Frequency Ordered Stop   04/21/21 1600  vancomycin (VANCOREADY) IVPB 1250 mg/250 mL  Status:  Discontinued        1,250 mg 166.7 mL/hr over 90 Minutes Intravenous Every 24 hours 04/20/21 1426 04/21/21 1301   04/20/21 1600  ceFEPIme (MAXIPIME) 2 g in sodium chloride 0.9 % 100 mL IVPB  Status:  Discontinued        2 g 200 mL/hr over 30 Minutes Intravenous Every 8 hours 04/20/21 1426 04/22/21 1348   04/20/21 1600  vancomycin (VANCOREADY) IVPB 2000 mg/400 mL        2,000 mg 200 mL/hr over  120 Minutes Intravenous  Once 04/20/21 1426 04/20/21 2030       Subjective: Without complaints this AM. Continues to feel weak  Objective: Vitals:   04/22/21 0719 04/22/21 0826 04/22/21 1127 04/22/21 1352  BP:  (!) 163/86 (!) 147/67   Pulse:  96 97   Resp:  18 19   Temp:  98.7 F (37.1 C) 98.4 F (36.9 C)   TempSrc:  Oral Oral   SpO2: 96% 99% 96% 92%  Weight:      Height:        Intake/Output Summary (Last 24 hours) at 04/22/2021 1516 Last data filed at 04/22/2021 0540 Gross per 24 hour  Intake 155.78 ml  Output 2840 ml  Net -2684.22 ml    Filed Weights   04/18/21 0500 04/19/21 0500 04/22/21 0500  Weight: 99.5 kg 104.4 kg 103.7 kg    Examination: General exam: Conversant, in no acute distress Respiratory system: normal chest rise, clear, no audible wheezing Cardiovascular system: regular rhythm, s1-s2 Gastrointestinal system: Nondistended, nontender, pos BS Central nervous system: No seizures, no tremors Extremities: No cyanosis, no joint deformities Skin: No rashes, no pallor Psychiatry: Affect normal // no auditory hallucinations   Data Reviewed: I have personally reviewed following labs and imaging studies  CBC: Recent Labs  Lab 04/17/21 1728 04/17/21 1928 04/18/21 0627 04/19/21 0438 04/20/21 0456 04/21/21 0443 04/22/21 0454  WBC 7.6   < > 10.3 19.7*   19.8* 9.8 9.2 11.6*  NEUTROABS 4.2  --   --  16.7*  --   --   --  HGB 13.0   < > 11.6* 11.3*   11.4* 9.7* 10.6* 11.4*  HCT 39.9   < > 35.2* 35.7*   34.9* 31.0* 32.8* 34.1*  MCV 97.8   < > 97.0 102.0*   100.9* 100.3* 99.7 97.4  PLT 225   < > 265 164   214 218 220 226   < > = values in this interval not displayed.    Basic Metabolic Panel: Recent Labs  Lab 04/18/21 0440 04/19/21 0438 04/20/21 0456 04/21/21 0443 04/22/21 0454  NA 134* 139 142 141 136  K 4.1 4.0 4.3 3.8 3.1*  CL 105 105 109 106 103  CO2 20* _0 GLUCOSE 150* 168* 152* 152* 101*  BUN 17 35* 37* 31* 23  CREATININE  1.04* 1.38* 0.84 0.62 0.70  CALCIUM 8.7* 8.8* 8.9 8.9 8.9  MG 2.4 2.1 2.6* 2.5* 2.2  PHOS 4.1 4.2 2.6 2.6 2.0*    GFR: Estimated Creatinine Clearance: 79.6 mL/min (by C-G formula based on SCr of 0.7 mg/dL). Liver Function Tests: Recent Labs  Lab 04/17/21 1728 04/19/21 0438  AST 20  --   ALT 11  --   ALKPHOS 67  --   BILITOT 0.7  --   PROT 7.8  --   ALBUMIN 4.1 3.5    No results for input(s): LIPASE, AMYLASE in the last 168 hours. No results for input(s): AMMONIA in the last 168 hours. Coagulation Profile: Recent Labs  Lab 04/17/21 1728  INR 0.9    Cardiac Enzymes: No results for input(s): CKTOTAL, CKMB, CKMBINDEX, TROPONINI in the last 168 hours. BNP (last 3 results) No results for input(s): PROBNP in the last 8760 hours. HbA1C: No results for input(s): HGBA1C in the last 72 hours. CBG: Recent Labs  Lab 04/20/21 1929 04/21/21 0715 04/21/21 1124 04/21/21 1634 04/21/21 2013  GLUCAP 116* 134* 129* 126* 117*    Lipid Profile: Recent Labs    04/21/21 0443  TRIG 223*    Thyroid Function Tests: No results for input(s): TSH, T4TOTAL, FREET4, T3FREE, THYROIDAB in the last 72 hours. Anemia Panel: No results for input(s): VITAMINB12, FOLATE, FERRITIN, TIBC, IRON, RETICCTPCT in the last 72 hours. Sepsis Labs: Recent Labs  Lab 04/19/21 0438 04/20/21 0456 04/21/21 0443  PROCALCITON <0.10 <0.10 <0.10     Recent Results (from the past 240 hour(s))  Resp Panel by RT-PCR (Flu A&B, Covid) Nasopharyngeal Swab     Status: None   Collection Time: 04/17/21  5:28 PM   Specimen: Nasopharyngeal Swab; Nasopharyngeal(NP) swabs in vial transport medium  Result Value Ref Range Status   SARS Coronavirus 2 by RT PCR NEGATIVE NEGATIVE Final    Comment: (NOTE) SARS-CoV-2 target nucleic acids are NOT DETECTED.  The SARS-CoV-2 RNA is generally detectable in upper respiratory specimens during the acute phase of infection. The lowest concentration of SARS-CoV-2 viral copies  this assay can detect is 138 copies/mL. A negative result does not preclude SARS-Cov-2 infection and should not be used as the sole basis for treatment or other patient management decisions. A negative result may occur with  improper specimen collection/handling, submission of specimen other than nasopharyngeal swab, presence of viral mutation(s) within the areas targeted by this assay, and inadequate number of viral copies(<138 copies/mL). A negative result must be combined with clinical observations, patient history, and epidemiological information. The expected result is Negative.  Fact Sheet for Patients:  EntrepreneurPulse.com.au  Fact Sheet for Healthcare Providers:  IncredibleEmployment.be  This test is no t yet  approved or cleared by the [place] and  has been authorized for detection and/or diagnosis of SARS-CoV-2 by FDA under an Emergency Use Authorization (EUA). This EUA will remain  in effect (meaning this test can be used) for the duration of the COVID-19 declaration under Section 564(b)(1) of the Act, 21 U.S.C.section 360bbb-3(b)(1), unless the authorization is terminated  or revoked sooner.       Influenza A by PCR NEGATIVE NEGATIVE Final   Influenza B by PCR NEGATIVE NEGATIVE Final    Comment: (NOTE) The Xpert Xpress SARS-CoV-2/FLU/RSV plus assay is intended as an aid in the diagnosis of influenza from Nasopharyngeal swab specimens and should not be used as a sole basis for treatment. Nasal washings and aspirates are unacceptable for Xpert Xpress SARS-CoV-2/FLU/RSV testing.  Fact Sheet for Patients: EntrepreneurPulse.com.au  Fact Sheet for Healthcare Providers: IncredibleEmployment.be  This test is not yet approved or cleared by the [place] FDA and has been authorized for detection and/or diagnosis of SARS-CoV-2 by FDA under an Emergency Use Authorization (EUA). This EUA  will remain in effect (meaning this test can be used) for the duration of the COVID-19 declaration under Section 564(b)(1) of the Act, 21 U.S.C. section 360bbb-3(b)(1), unless the authorization is terminated or revoked.  Performed at [facility], [address] [place], [place] [address]   MRSA Next Gen by PCR, Nasal     Status: None   Collection Time: 04/17/21  8:41 PM   Specimen: Nasal Mucosa; Nasal Swab  Result Value Ref Range Status   MRSA by PCR Next Gen NOT DETECTED NOT DETECTED Final    Comment: (NOTE) The GeneXpert MRSA Assay (FDA approved for NASAL specimens only), is one component of a comprehensive MRSA colonization surveillance program. It is not intended to diagnose MRSA infection nor to guide or monitor treatment for MRSA infections. Test performance is not FDA approved in patients less than 25 years old. Performed at [facility], [address] [place], [place] [address]   Culture, Respiratory w Gram Stain     Status: None   Collection Time: 04/20/21  8:46 AM   Specimen: Tracheal Aspirate; Respiratory  Result Value Ref Range Status   Specimen Description   Final    TRACHEAL ASPIRATE Performed at [facility], [address], [place], [place] [address]    Special Requests   Final    NONE Performed at [facility], [address] [place], [place] [address]    Gram Stain   Final    ABUNDANT WBC PRESENT,BOTH PMN AND MONONUCLEAR ABUNDANT GRAM POSITIVE COCCI IN PAIRS IN CLUSTERS IN CHAINS MODERATE GRAM NEGATIVE RODS    Culture   Final    Normal respiratory flora-no Staph aureus or Pseudomonas seen Performed at [facility], [place] [address], [place], [place] [address]    Report Status 04/22/2021 FINAL  Final      Radiology Studies: No results found.  Scheduled Meds:  albuterol  2.5 mg Nebulization Q6H   budesonide (PULMICORT) nebulizer solution  0.25 mg Nebulization BID   chlorhexidine gluconate (MEDLINE  KIT)  15 mL Mouth Rinse BID   Chlorhexidine Gluconate Cloth  6 each Topical Daily   dexamethasone (DECADRON) injection  4 mg Intravenous Q8H   diphenhydrAMINE  12.5 mg Oral Q8H   enoxaparin (LOVENOX) injection  0.5 mg/kg Subcutaneous Q24H   famotidine  20 mg Oral BID   feeding supplement  237 mL Oral TID BM   hydrALAZINE  25  mg Oral Q8H   levothyroxine  75 mcg Per Tube Q0600   multivitamin with minerals  1 tablet Oral Daily   pantoprazole  40 mg Oral Daily   sertraline  100 mg Oral Daily   Continuous Infusions:  sodium chloride 10 mL/hr at 04/17/21 1851   sodium chloride 10 mL/hr at 04/20/21 1800     LOS: 5 days   Marylu Lund, MD [facility] Pager On Amion  If 7PM-7AM, please contact night-coverage 04/22/2021, 3:16 PM

## 2021-04-22 NOTE — Progress Notes (Signed)
Nutrition Follow-up  DOCUMENTATION CODES:   Obesity unspecified  INTERVENTION:   -Continue Ensure Enlive po TID, each supplement provides 350 kcal and 20 grams of protein  -MVI with minerals daily -Magic cup TID with meals, each supplement provides 290 kcal and 9 grams of protein   NUTRITION DIAGNOSIS:   Inadequate oral intake related to inability to eat (pt sedated and ventilated) as evidenced by NPO status.  Progressing; advanced to PO diet on 04/21/21  GOAL:   Patient will meet greater than or equal to 90% of their needs  Progressing   MONITOR:   PO intake, Supplement acceptance, Labs, Weight trends, Skin, I & O's  REASON FOR ASSESSMENT:   Consult Enteral/tube feeding initiation and management  ASSESSMENT:   70 y/o female with h/o HTN, GERD, anxiety and HLD who is admitted with angioedema.  12/29- s/p BSE- advanced to dysphagia 3 diet with thin liquids  Reviewed I/O's: -3.1 L x 24 hours and +3.5 L since admission  UOP: 3.3 L x 24 hours   Spoke with pt at bedside, who was pleasant and in good spirits today. She reports feeling better today. She does not have much of an appetite- noted pt consumed a few bites of scrambled eggs this AM. Pt reports she feels more comfortable drinking liquids. She has also consumed a half of an Ensure supplements, which she enjoyed. Discussed importance of good meal and supplement intake to promote healing.   Medications reviewed and include decadron and potassium chloride.   Labs reviewed: K: 3.1, CBGS: 111-134 (inpatient orders for glycemic control are none).    Diet Order:   Diet Order             DIET DYS 3 Room service appropriate? Yes; Fluid consistency: Thin  Diet effective now                   EDUCATION NEEDS:   Education needs have been addressed  Skin:  Skin Assessment: Reviewed RN Assessment  Last BM:  04/21/21  Height:   Ht Readings from Last 1 Encounters:  04/17/21 5' 5.98" (1.676 m)    Weight:    Wt Readings from Last 1 Encounters:  04/22/21 103.7 kg    Ideal Body Weight:  59 kg  BMI:  Body mass index is 36.92 kg/m.  Estimated Nutritional Needs:   Kcal:  1750-1950  Protein:  90-105 grams  Fluid:  > 1.7 L    Levada Schilling, RD, LDN, CDCES Registered Dietitian II Certified Diabetes Care and Education Specialist Please refer to [facility] for RD and/or RD on-call/weekend/after hours pager

## 2021-04-22 NOTE — Evaluation (Signed)
Occupational Therapy Evaluation Patient Details Name: Sharon [name] MRN: [id number] DOB: 29-Mar-1951 Today's Date: 04/22/2021   History of Present Illness Pt is a 70 y.o. female admitted with Acute Respiratory Failure secondary to airway compromise from angioedema in the setting of suspected allergic reaction from ARB requiring intubation for airway protection. PMH of cateracts, anxiety, GERD, HLD, HTN.   Clinical Impression   Pt was seen for OT evaluation this date. Pt alert and oriented grossly x4, slow to process/respond and requiring VC during session for sequencing/problem solving. Prior to hospital admission, pt reports being independent and endorses frequent falls in her garden while picking weeds. Pt lives with her spouse. Currently pt demonstrates impairments as described below (See OT problem list) which functionally limit her ability to perform ADL/self-care tasks. Pt currently requires MOD A for bed mobility, MIN-MOD A for ADL transfers (STS, step pivot), MIN-MOD A for LB ADL, and MAX A for pericare in standing after small BM in BSC. Pt required VC for safety/sequencing/problem solving during session. Pt instructed in bed mobility, RW mgt, falls prevention, ADL transfers, AE/DME and home/routines modifications for ADL tasks. Pt verbalized understanding. Pt would benefit from skilled OT services to address noted impairments and functional limitations (see below for any additional details) in order to maximize safety and independence while minimizing falls risk and caregiver burden. Upon hospital discharge, recommend STR to maximize pt safety and return to PLOF.     Recommendations for follow up therapy are one component of a multi-disciplinary discharge planning process, led by the attending physician.  Recommendations may be updated based on patient status, additional functional criteria and insurance authorization.   Follow Up Recommendations  Skilled nursing-short term rehab (<3  hours/day)    Assistance Recommended at Discharge Frequent or constant Supervision/Assistance  Functional Status Assessment  Patient has had a recent decline in their functional status and demonstrates the ability to make significant improvements in function in a reasonable and predictable amount of time.  Equipment Recommendations  Tub/shower seat    Recommendations for Other Services       Precautions / Restrictions Precautions Precautions: Fall Restrictions Weight Bearing Restrictions: No      Mobility Bed Mobility Overal bed mobility: Needs Assistance Bed Mobility: Supine to Sit;Sit to Supine   Sidelying to sit: Mod assist;HOB elevated Supine to sit: Mod assist     General bed mobility comments: VC sequencing, bed rails    Transfers Overall transfer level: Needs assistance Equipment used: Rolling walker (2 wheels) Transfers: Sit to/from Stand;Bed to chair/wheelchair/BSC Sit to Stand: Min assist;Mod assist     Step pivot transfers: Min assist    Lateral/Scoot Transfers: Min assist General transfer comment: VC RW mgt/ sequencing      Balance Overall balance assessment: Needs assistance Sitting-balance support: Feet supported Sitting balance-Leahy Scale: Fair     Standing balance support: Reliant on assistive device for balance;Bilateral upper extremity supported;Single extremity supported;During functional activity Standing balance-Leahy Scale: Fair Standing balance comment: fair-, able to attempt pericare in standing with UE support on RW with no overt LOB                           ADL either performed or assessed with clinical judgement   ADL Overall ADL's : Needs assistance/impaired  General ADL Comments: Pt currently requires MIN-MOD A for LB ADL, MAX A for pericare in standing, and set up/supv for seated ADL tasks.     Vision         Perception     Praxis      Pertinent  Vitals/Pain Pain Assessment: No/denies pain     Hand Dominance Right   Extremity/Trunk Assessment Upper Extremity Assessment Upper Extremity Assessment: Overall WFL for tasks assessed   Lower Extremity Assessment Lower Extremity Assessment: Generalized weakness   Cervical / Trunk Assessment Cervical / Trunk Assessment: Normal   Communication     Cognition Arousal/Alertness: Awake/alert Behavior During Therapy: WFL for tasks assessed/performed Overall Cognitive Status: No family/caregiver present to determine baseline cognitive functioning                                 General Comments: pt slow to process, cues for simple commands/sequencing intermittently, decr safety awareness/problem solving noted; per chart spouse reports cognitive decline over past 2-3 years     General Comments       Exercises Other Exercises Other Exercises: Pt instructed in bed mobility, RW mgt, falls prevention, ADL transfers, AE/DME and home/routines modifications for ADL tasks   Shoulder Instructions      Home Living Family/patient expects to be discharged to:: Private residence Living Arrangements: Spouse/significant other Available Help at Discharge: Family Type of Home: House Home Access: Stairs to enter Secretary/administrator of Steps: 5, unable to recall which side the rail is on   Home Layout: Two level     Bathroom Shower/Tub: Walk-in shower             Additional Comments: pt does reference falling in her garden often      Prior Functioning/Environment Prior Level of Function : Independent/Modified Independent               ADLs Comments: pt reports indep with basic ADL, gardening before this        OT Problem List: Decreased safety awareness;Decreased activity tolerance;Decreased strength;Cardiopulmonary status limiting activity;Decreased knowledge of use of DME or AE;Impaired balance (sitting and/or standing);Obesity      OT  Treatment/Interventions: Self-care/ADL training;Therapeutic exercise;Therapeutic activities;Cognitive remediation/compensation;DME and/or AE instruction;Energy conservation;Patient/family education;Balance training    OT Goals(Current goals can be found in the care plan section) Acute Rehab OT Goals Patient Stated Goal: get stronger OT Goal Formulation: With patient Time For Goal Achievement: 05/06/21 Potential to Achieve Goals: Good ADL Goals Pt Will Perform Lower Body Dressing: with supervision;with set-up;sit to/from stand Pt Will Transfer to Toilet: with supervision;ambulating (elevated commode, LRAD PRN) Pt Will Perform Toileting - Clothing Manipulation and hygiene: with supervision;sit to/from stand Additional ADL Goal #1: Pt will perform grooming tasks standing at the sink with supervision and set up, SpO2 remaining >92% on room air.  OT Frequency: Min 2X/week   Barriers to D/C:            Co-evaluation              AM-PAC OT "6 Clicks" Daily Activity     Outcome Measure Help from another person eating meals?: A Little Help from another person taking care of personal grooming?: A Little Help from another person toileting, which includes using toliet, bedpan, or urinal?: A Lot Help from another person bathing (including washing, rinsing, drying)?: A Lot Help from another person to put on and taking off regular upper body clothing?: A Little  Help from another person to put on and taking off regular lower body clothing?: A Lot 6 Click Score: 15   End of Session Equipment Utilized During Treatment: Rolling walker (2 wheels) Nurse Communication: Other (comment);Mobility status (O2 sats)  Activity Tolerance: Patient tolerated treatment well Patient left: in bed;with call bell/phone within reach;with bed alarm set;with family/visitor present (spouse in at end of session)  OT Visit Diagnosis: Other abnormalities of gait and mobility (R26.89);Repeated falls (R29.6);Muscle  weakness (generalized) (M62.81)                Time: 9735-3299 OT Time Calculation (min): 33 min Charges:  OT General Charges $OT Visit: 1 Visit OT Evaluation $OT Eval Moderate Complexity: 1 Mod OT Treatments $Self Care/Home Management : 23-37 mins  Arman Filter., MPH, MS, OTR/L ascom [phone] 04/22/21, 1:47 PM

## 2021-04-22 NOTE — Care Management Important Message (Signed)
Important Message  Patient Details  Name: Sharon [name] MRN: [id number] Date of Birth: [birth year]   Medicare Important Message Given:  N/A - LOS <3 / Initial given by admissions     Olegario Messier A Cristin Szatkowski 04/22/2021, 8:32 AM

## 2021-04-23 DIAGNOSIS — R220 Localized swelling, mass and lump, head: Secondary | ICD-10-CM

## 2021-04-23 DIAGNOSIS — J9601 Acute respiratory failure with hypoxia: Secondary | ICD-10-CM | POA: Diagnosis not present

## 2021-04-23 LAB — COMPREHENSIVE METABOLIC PANEL
ALT: 60 U/L — ABNORMAL HIGH (ref 0–44)
AST: 54 U/L — ABNORMAL HIGH (ref 15–41)
Albumin: 3.2 g/dL — ABNORMAL LOW (ref 3.5–5.0)
Alkaline Phosphatase: 94 U/L (ref 38–126)
Anion gap: 9 (ref 5–15)
BUN: 17 mg/dL (ref 8–23)
CO2: 25 mmol/L (ref 22–32)
Calcium: 9 mg/dL (ref 8.9–10.3)
Chloride: 102 mmol/L (ref 98–111)
Creatinine, Ser: 0.56 mg/dL (ref 0.44–1.00)
GFR, Estimated: >60 mL/min (ref >60)
Glucose, Bld: 102 mg/dL — ABNORMAL HIGH (ref 70–99)
Potassium: 4 mmol/L (ref 3.5–5.1)
Sodium: 136 mmol/L (ref 135–145)
Total Bilirubin: 1.2 mg/dL (ref 0.3–1.2)
Total Protein: 6.5 g/dL (ref 6.5–8.1)

## 2021-04-23 LAB — CBC
HCT: 33.8 % — ABNORMAL LOW (ref 36.0–46.0)
Hemoglobin: 11.4 g/dL — ABNORMAL LOW (ref 12.0–15.0)
MCH: 31.9 pg (ref 26.0–34.0)
MCHC: 33.7 g/dL (ref 30.0–36.0)
MCV: 94.7 fL (ref 80.0–100.0)
Platelets: 215 10*3/uL (ref 150–400)
RBC: 3.57 MIL/uL — ABNORMAL LOW (ref 3.87–5.11)
RDW: 13.3 % (ref 11.5–15.5)
WBC: 10.5 10*3/uL (ref 4.0–10.5)
nRBC: 0 % (ref 0.0–0.2)

## 2021-04-23 MED ORDER — DEXAMETHASONE SODIUM PHOSPHATE 4 MG/ML IJ SOLN
4.0000 mg | INTRAMUSCULAR | Status: DC
  Administered 2021-04-24: 4 mg via INTRAVENOUS
  Filled 2021-04-23: qty 1

## 2021-04-23 MED ORDER — ALBUTEROL SULFATE (2.5 MG/3ML) 0.083% IN NEBU
2.5000 mg | INHALATION_SOLUTION | Freq: Two times a day (BID) | RESPIRATORY_TRACT | Status: DC
  Administered 2021-04-23: 2.5 mg via RESPIRATORY_TRACT
  Filled 2021-04-23 (×3): qty 3

## 2021-04-23 MED ORDER — TRAZODONE HCL 50 MG PO TABS
100.0000 mg | ORAL_TABLET | Freq: Every evening | ORAL | Status: DC | PRN
  Administered 2021-04-23 – 2021-04-24 (×2): 100 mg via ORAL
  Filled 2021-04-23 (×2): qty 2

## 2021-04-23 NOTE — TOC Progression Note (Addendum)
Transition of Care [facility]) - Progression Note    Patient Details  Name: Sharon [name] MRN: [id number] Date of Birth: 29-Mar-1951  [facility] CM/SW Contact  Liliana Cline, LCSW Phone Number: 04/23/2021, 10:14 AM  Clinical Narrative:    CSW called patient's spouse. Explained PT and OT recs for SNF for short term rehab. He stated he will talk with patient and call CSW back tomorrow with a decision. He is aware of other option of home with HH.  12:45- Spoke to patient's spouse who had questions about insurance coverage for SNF. Explained insurance approval process and [facility] Medicare coverage for SNF rehab. He stated he wants to talk with his wife more tomorrow and will call CSW back.   Expected Discharge Plan: Home w Home Health Services Barriers to Discharge: Continued Medical Work up  Expected Discharge Plan and Services Expected Discharge Plan: Home w Home Health Services   Discharge Planning Services: CM Consult   Living arrangements for the past 2 months: Single Family Home                 DME Arranged: N/A DME Agency: NA                   Social Determinants of Health (SDOH) Interventions    Readmission Risk Interventions No flowsheet data found.

## 2021-04-23 NOTE — Progress Notes (Signed)
SLP discussed swallowing needs with nursing. Patient is tolerating current diet of dysphagia 3 (mechanical soft) and thin liquids without difficulty. This diet remains appropriate for pt at this time. No further ST needs indicated, will s/o.  Rondel Baton, MS, Sports administrator

## 2021-04-23 NOTE — Progress Notes (Signed)
PROGRESS NOTE    Sharon [name]  LHT:[id number] DOB: 1951-02-23 DOA: 04/17/2021 PCP: Abner Greenspan, MD    Brief Narrative:  70yo who presented with acute respiratory failure secondary to angioedema, requiring mechanical ventilation for airway protection. Pt was extubated and now on medical service  Assessment & Plan:   Principal Problem:   Angioedema  Acute respiratory failure secondary to angioedema Presumed secondary to ARB, avoid further use Have started scheduled hydralazine for BP control for now Advised pt to f/u w/ allergist as outpatient after discharge Cont to wean steroids and wean o2 as tolerated Therapy recs for SNF. TOC following Leukocytosis Normalized On empiric abx for possible PNA, however pro-cal is neg Hold further abx ARF Renal function normalized Cont to follow renal function Hypothyroid Continue thyroid replacement as tolerated TSH 6.255 with normal free T4 Hypotension Presumed secondary to sedation while on vent Now on hydralazine for BP control BP now much improved Anxiety Cont home SSRI Have ordered prn xanax while in hospital PRN anxiety  DVT prophylaxis: Lovenox subq Code Status: Full Family Communication: Pt in room, family not at bedside  Status is: Inpatient  Remains inpatient appropriate because: severity of illness    Consultants:  PCCM  Procedures:    Antimicrobials: Anti-infectives (From admission, onward)    Start     Dose/Rate Route Frequency Ordered Stop   04/21/21 1600  vancomycin (VANCOREADY) IVPB 1250 mg/250 mL  Status:  Discontinued        1,250 mg 166.7 mL/hr over 90 Minutes Intravenous Every 24 hours 04/20/21 1426 04/21/21 1301   04/20/21 1600  ceFEPIme (MAXIPIME) 2 g in sodium chloride 0.9 % 100 mL IVPB  Status:  Discontinued        2 g 200 mL/hr over 30 Minutes Intravenous Every 8 hours 04/20/21 1426 04/22/21 1348   04/20/21 1600  vancomycin (VANCOREADY) IVPB 2000 mg/400 mL        2,000 mg 200 mL/hr  over 120 Minutes Intravenous  Once 04/20/21 1426 04/20/21 2030       Subjective: Reports feeling better, eager to be discharged soon  Objective: Vitals:   04/23/21 0507 04/23/21 0700 04/23/21 0853 04/23/21 1303  BP: (!) 169/93  (!) 164/75 137/76  Pulse: 88  85 83  Resp: _0 Temp: 98.4 F (36.9 C)  97.9 F (36.6 C) 98.2 F (36.8 C)  TempSrc: Oral  Oral Oral  SpO2: 95% 94% 97% 96%  Weight:      Height:        Intake/Output Summary (Last 24 hours) at 04/23/2021 1508 Last data filed at 04/23/2021 1500 Gross per 24 hour  Intake 120 ml  Output 4550 ml  Net -4430 ml    Filed Weights   04/19/21 0500 04/22/21 0500 04/23/21 0500  Weight: 104.4 kg 103.7 kg 104 kg    Examination: General exam: Awake, laying in bed, in nad Respiratory system: Normal respiratory effort, no wheezing Cardiovascular system: regular rate, s1, s2 Gastrointestinal system: Soft, nondistended, positive BS Central nervous system: CN2-12 grossly intact, strength intact Extremities: Perfused, no clubbing Skin: Normal skin turgor, no notable skin lesions seen Psychiatry: Mood normal // no visual hallucinations   Data Reviewed: I have personally reviewed following labs and imaging studies  CBC: Recent Labs  Lab 04/17/21 1728 04/17/21 1928 04/19/21 0438 04/20/21 0456 04/21/21 0443 04/22/21 0454 04/23/21 0524  WBC 7.6   < > 19.7*   19.8* 9.8 9.2 11.6* 10.5  NEUTROABS 4.2  --  16.7*  --   --   --   --   HGB 13.0   < > 11.3*   11.4* 9.7* 10.6* 11.4* 11.4*  HCT 39.9   < > 35.7*   34.9* 31.0* 32.8* 34.1* 33.8*  MCV 97.8   < > 102.0*   100.9* 100.3* 99.7 97.4 94.7  PLT 225   < > 164   214 218 220 226 215   < > = values in this interval not displayed.    Basic Metabolic Panel: Recent Labs  Lab 04/18/21 0440 04/19/21 0438 04/20/21 0456 04/21/21 0443 04/22/21 0454 04/23/21 0524  NA 134* 139 142 141 136 136  K 4.1 4.0 4.3 3.8 3.1* 4.0  CL 105 105 109 106 103 102  CO2 20* _0 GLUCOSE 150* 168* 152* 152* 101* 102*  BUN 17 35* 37* 31* 23 17  CREATININE 1.04* 1.38* 0.84 0.62 0.70 0.56  CALCIUM 8.7* 8.8* 8.9 8.9 8.9 9.0  MG 2.4 2.1 2.6* 2.5* 2.2  --   PHOS 4.1 4.2 2.6 2.6 2.0*  --     GFR: Estimated Creatinine Clearance: 79.7 mL/min (by C-G formula based on SCr of 0.56 mg/dL). Liver Function Tests: Recent Labs  Lab 04/17/21 1728 04/19/21 0438 04/23/21 0524  AST 20  --  54*  ALT 11  --  60*  ALKPHOS 67  --  94  BILITOT 0.7  --  1.2  PROT 7.8  --  6.5  ALBUMIN 4.1 3.5 3.2*    No results for input(s): LIPASE, AMYLASE in the last 168 hours. No results for input(s): AMMONIA in the last 168 hours. Coagulation Profile: Recent Labs  Lab 04/17/21 1728  INR 0.9    Cardiac Enzymes: No results for input(s): CKTOTAL, CKMB, CKMBINDEX, TROPONINI in the last 168 hours. BNP (last 3 results) No results for input(s): PROBNP in the last 8760 hours. HbA1C: No results for input(s): HGBA1C in the last 72 hours. CBG: Recent Labs  Lab 04/20/21 1929 04/21/21 0715 04/21/21 1124 04/21/21 1634 04/21/21 2013  GLUCAP 116* 134* 129* 126* 117*    Lipid Profile: Recent Labs    04/21/21 0443  TRIG 223*    Thyroid Function Tests: No results for input(s): TSH, T4TOTAL, FREET4, T3FREE, THYROIDAB in the last 72 hours. Anemia Panel: No results for input(s): VITAMINB12, FOLATE, FERRITIN, TIBC, IRON, RETICCTPCT in the last 72 hours. Sepsis Labs: Recent Labs  Lab 04/19/21 0438 04/20/21 0456 04/21/21 0443  PROCALCITON <0.10 <0.10 <0.10     Recent Results (from the past 240 hour(s))  Resp Panel by RT-PCR (Flu A&B, Covid) Nasopharyngeal Swab     Status: None   Collection Time: 04/17/21  5:28 PM   Specimen: Nasopharyngeal Swab; Nasopharyngeal(NP) swabs in vial transport medium  Result Value Ref Range Status   SARS Coronavirus 2 by RT PCR NEGATIVE NEGATIVE Final    Comment: (NOTE) SARS-CoV-2 target nucleic acids are NOT DETECTED.  The SARS-CoV-2 RNA is  generally detectable in upper respiratory specimens during the acute phase of infection. The lowest concentration of SARS-CoV-2 viral copies this assay can detect is 138 copies/mL. A negative result does not preclude SARS-Cov-2 infection and should not be used as the sole basis for treatment or other patient management decisions. A negative result may occur with  improper specimen collection/handling, submission of specimen other than nasopharyngeal swab, presence of viral mutation(s) within the areas targeted by this assay, and inadequate number of viral copies(<138 copies/mL). A negative result  must be combined with clinical observations, patient history, and epidemiological information. The expected result is Negative.  Fact Sheet for Patients:  EntrepreneurPulse.com.au  Fact Sheet for Healthcare Providers:  IncredibleEmployment.be  This test is no t yet approved or cleared by the [place] FDA and  has been authorized for detection and/or diagnosis of SARS-CoV-2 by FDA under an Emergency Use Authorization (EUA). This EUA will remain  in effect (meaning this test can be used) for the duration of the COVID-19 declaration under Section 564(b)(1) of the Act, 21 U.S.C.section 360bbb-3(b)(1), unless the authorization is terminated  or revoked sooner.       Influenza A by PCR NEGATIVE NEGATIVE Final   Influenza B by PCR NEGATIVE NEGATIVE Final    Comment: (NOTE) The Xpert Xpress SARS-CoV-2/FLU/RSV plus assay is intended as an aid in the diagnosis of influenza from Nasopharyngeal swab specimens and should not be used as a sole basis for treatment. Nasal washings and aspirates are unacceptable for Xpert Xpress SARS-CoV-2/FLU/RSV testing.  Fact Sheet for Patients: EntrepreneurPulse.com.au  Fact Sheet for Healthcare Providers: IncredibleEmployment.be  This test is not yet approved or cleared by the [place] FDA and has been authorized for detection and/or diagnosis of SARS-CoV-2 by FDA under an Emergency Use Authorization (EUA). This EUA will remain in effect (meaning this test can be used) for the duration of the COVID-19 declaration under Section 564(b)(1) of the Act, 21 U.S.C. section 360bbb-3(b)(1), unless the authorization is terminated or revoked.  Performed at [facility], [address] [place], [place] [address]   MRSA Next Gen by PCR, Nasal     Status: None   Collection Time: 04/17/21  8:41 PM   Specimen: Nasal Mucosa; Nasal Swab  Result Value Ref Range Status   MRSA by PCR Next Gen NOT DETECTED NOT DETECTED Final    Comment: (NOTE) The GeneXpert MRSA Assay (FDA approved for NASAL specimens only), is one component of a comprehensive MRSA colonization surveillance program. It is not intended to diagnose MRSA infection nor to guide or monitor treatment for MRSA infections. Test performance is not FDA approved in patients less than 3 years old. Performed at [facility], [address] [place], [place] [address]   Culture, Respiratory w Gram Stain     Status: None   Collection Time: 04/20/21  8:46 AM   Specimen: Tracheal Aspirate; Respiratory  Result Value Ref Range Status   Specimen Description   Final    TRACHEAL ASPIRATE Performed at [facility], [address], [place], [place] [address]    Special Requests   Final    NONE Performed at [facility], [address] [place], [place] [address]    Gram Stain   Final    ABUNDANT WBC PRESENT,BOTH PMN AND MONONUCLEAR ABUNDANT GRAM POSITIVE COCCI IN PAIRS IN CLUSTERS IN CHAINS MODERATE GRAM NEGATIVE RODS    Culture   Final    Normal respiratory flora-no Staph aureus or Pseudomonas seen Performed at [facility], [place] [address], [place],  [address]    Report Status 04/22/2021 FINAL  Final      Radiology Studies: No results found.  Scheduled Meds:   albuterol  2.5 mg Nebulization BID   budesonide (PULMICORT) nebulizer solution  0.25 mg Nebulization BID   chlorhexidine gluconate (MEDLINE KIT)  15 mL Mouth Rinse BID   Chlorhexidine Gluconate Cloth  6 each Topical Daily   [START ON 04/24/2021] dexamethasone (DECADRON) injection  4 mg Intravenous Q24H  diphenhydrAMINE  12.5 mg Oral Q8H   enoxaparin (LOVENOX) injection  0.5 mg/kg Subcutaneous Q24H   famotidine  20 mg Oral BID   feeding supplement  237 mL Oral TID BM   hydrALAZINE  25 mg Oral Q8H   levothyroxine  75 mcg Per Tube Q0600   multivitamin with minerals  1 tablet Oral Daily   pantoprazole  40 mg Oral Daily   sertraline  100 mg Oral Daily   Continuous Infusions:  sodium chloride 10 mL/hr at 04/17/21 1851   sodium chloride 10 mL/hr at 04/20/21 1800     LOS: 6 days   Marylu Lund, MD [facility] Pager On Amion  If 7PM-7AM, please contact night-coverage 04/23/2021, 3:08 PM

## 2021-04-24 DIAGNOSIS — T783XXA Angioneurotic edema, initial encounter: Principal | ICD-10-CM

## 2021-04-24 DIAGNOSIS — R220 Localized swelling, mass and lump, head: Secondary | ICD-10-CM | POA: Diagnosis not present

## 2021-04-24 LAB — CREATININE, SERUM
Creatinine, Ser: 0.67 mg/dL (ref 0.44–1.00)
GFR, Estimated: >60 mL/min (ref >60)

## 2021-04-24 NOTE — NC FL2 (Signed)
[place] LEVEL OF CARE SCREENING TOOL     IDENTIFICATION  Patient Name: Sharon [name] Birthdate: 1950-07-01 Sex: female Admission Date (Current Location): 04/17/2021  [facility] and [place] Number:  Engineering geologist and Address:  [facility], [address] [place], [place] [address]      Provider Number: [id number]  Attending Physician Name and Address:  Donne Hazel, MD  Relative Name and Phone Number:  Adine, Heimann (Spouse)   [phone] (Mobile)    Current Level of Care: Hospital Recommended Level of Care: [employer] Prior Approval Number:    Date Approved/Denied:   PASRR Number: [id number]  Discharge Plan:      Current Diagnoses: Patient Active Problem List   Diagnosis Date Noted   Angioedema 04/17/2021   Dizziness 08/23/2020   Fatigue 08/23/2020   Rib fracture 08/23/2020   Elevated fasting glucose 12/17/2019   Encounter for hepatitis C screening test for low risk patient 12/17/2019   Medicare annual wellness visit, subsequent 08/28/2018   Cough 01/28/2018   Welcome to Medicare preventive visit 08/22/2016   Obesity 10/05/2014   Colon cancer screening 03/03/2013   Hypokalemia 10/09/2011   Routine general medical examination at a health care facility 10/02/2011   Vitamin D deficiency disease 12/28/2010   ARTHRALGIA 08/19/2007   Hypothyroidism 01/31/2007   Hyperlipidemia 01/31/2007   ANXIETY 01/31/2007   Essential hypertension 01/31/2007   GERD 01/31/2007    Orientation RESPIRATION BLADDER Height & Weight     Self, Time, Situation, Place  Normal Incontinent Weight: 218 lb 3.2 oz (99 kg) Height:  5' 5.98" (167.6 cm)  BEHAVIORAL SYMPTOMS/MOOD NEUROLOGICAL BOWEL NUTRITION STATUS      Continent Diet (dys 3)  AMBULATORY STATUS COMMUNICATION OF NEEDS Skin   Supervision Verbally Bruising                       Personal Care Assistance Level of Assistance  Bathing, Feeding, Dressing  Bathing Assistance: Limited assistance Feeding assistance: Independent Dressing Assistance: Limited assistance     Functional Limitations Info             SPECIAL CARE FACTORS FREQUENCY  PT (By licensed PT), OT (By licensed OT)     PT Frequency: 5 times per week OT Frequency: 5 times per week            Contractures      Additional Factors Info  Code Status, Allergies Code Status Info: full Allergies Info: Allergies: Cozaar (Losartan), Amlodipine Besylate, Atorvastatin, Neomycin-bacitracin Zn-polymyx           Current Medications (04/24/2021):  This is the current hospital active medication list Current Facility-Administered Medications  Medication Dose Route Frequency Provider Last Rate Last Admin   0.9 %  sodium chloride infusion   Intravenous Continuous Donne Hazel, MD 10 mL/hr at 04/17/21 1851 New Bag at 04/17/21 1851   0.9 %  sodium chloride infusion  250 mL Intravenous Continuous Donne Hazel, MD 10 mL/hr at 04/20/21 1800 Infusion Verify at 04/20/21 1800   acetaminophen (TYLENOL) tablet 650 mg  650 mg Per Tube Q4H PRN Donne Hazel, MD   650 mg at 04/20/21 1832   albuterol (PROVENTIL) (2.5 MG/3ML) 0.083% nebulizer solution 2.5 mg  2.5 mg Nebulization BID Donne Hazel, MD   2.5 mg at 04/23/21 1908   ALPRAZolam Duanne Moron) tablet 0.5 mg  0.5 mg Oral BID PRN Donne Hazel, MD  budesonide (PULMICORT) nebulizer solution 0.25 mg  0.25 mg Nebulization BID Donne Hazel, MD   0.25 mg at 04/23/21 1908   chlorhexidine gluconate (MEDLINE KIT) (PERIDEX) 0.12 % solution 15 mL  15 mL Mouth Rinse BID Donne Hazel, MD   15 mL at 04/23/21 0921   dexamethasone (DECADRON) injection 4 mg  4 mg Intravenous Q24H Donne Hazel, MD   4 mg at 04/24/21 0857   diphenhydrAMINE (BENADRYL) 12.5 MG/5ML elixir 12.5 mg  12.5 mg Oral Q8H Donne Hazel, MD   12.5 mg at 04/24/21 0602   docusate sodium (COLACE) capsule 100 mg  100 mg Oral BID PRN Donne Hazel, MD        enoxaparin (LOVENOX) injection 50 mg  0.5 mg/kg Subcutaneous Q24H Donne Hazel, MD   50 mg at 04/23/21 2159   famotidine (PEPCID) tablet 20 mg  20 mg Oral BID Donne Hazel, MD   20 mg at 04/24/21 0858   feeding supplement (ENSURE ENLIVE / ENSURE PLUS) liquid 237 mL  237 mL Oral TID BM Donne Hazel, MD   237 mL at 04/23/21 2156   hydrALAZINE (APRESOLINE) injection 10 mg  10 mg Intravenous Q4H PRN Donne Hazel, MD       hydrALAZINE (APRESOLINE) tablet 25 mg  25 mg Oral Q8H Donne Hazel, MD   25 mg at 04/24/21 0602   levothyroxine (SYNTHROID) tablet 75 mcg  75 mcg Per Tube Q0600 Donne Hazel, MD   75 mcg at 04/24/21 0602   multivitamin with minerals tablet 1 tablet  1 tablet Oral Daily Donne Hazel, MD   1 tablet at 04/23/21 0920   ondansetron (ZOFRAN) injection 4 mg  4 mg Intravenous Q6H PRN Donne Hazel, MD       pantoprazole (PROTONIX) EC tablet 40 mg  40 mg Oral Daily Donne Hazel, MD   40 mg at 04/24/21 0859   polyethylene glycol (MIRALAX / GLYCOLAX) packet 17 g  17 g Oral Daily PRN Donne Hazel, MD       sertraline (ZOLOFT) tablet 100 mg  100 mg Oral Daily Donne Hazel, MD   100 mg at 04/24/21 0859   traZODone (DESYREL) tablet 100 mg  100 mg Oral QHS PRN Donne Hazel, MD   100 mg at 04/23/21 2159     Discharge Medications: Please see discharge summary for a list of discharge medications.  Relevant Imaging Results:  Relevant Lab Results:   Additional Information SS #: [id number]  Ray, LCSW

## 2021-04-24 NOTE — Progress Notes (Signed)
PROGRESS NOTE    Sharon [name]  UUE:[id number] DOB: 1950-12-19 DOA: 04/17/2021 PCP: Abner Greenspan, MD    Brief Narrative:  71yo who presented with acute respiratory failure secondary to angioedema, requiring mechanical ventilation for airway protection. Pt was extubated and now on medical service  Assessment & Plan:   Principal Problem:   Angioedema  Acute respiratory failure secondary to angioedema Presumed secondary to ARB, avoid further use Have started scheduled hydralazine for BP control for now Advised pt to f/u w/ allergist as outpatient after discharge O2 weaned to RA. Have weaned steroids to off Therapy recs for SNF. TOC is following Leukocytosis Normalized Was initially on empiric abx for possible PNA, however pro-cal is neg Hold further abx ARF Renal function normalized Cont to follow renal function Hypothyroid Continue thyroid replacement as tolerated TSH 6.255 with normal free T4 Hypotension Presumed secondary to sedation while on vent Now on hydralazine for BP control BP now improved, tolerating hydralazine Anxiety Cont home SSRI Have ordered prn xanax while in hospital PRN anxiety  DVT prophylaxis: Lovenox subq Code Status: Full Family Communication: Pt in room, family not at bedside  Status is: Inpatient  Remains inpatient appropriate because: severity of illness    Consultants:  PCCM  Procedures:    Antimicrobials: Anti-infectives (From admission, onward)    Start     Dose/Rate Route Frequency Ordered Stop   04/21/21 1600  vancomycin (VANCOREADY) IVPB 1250 mg/250 mL  Status:  Discontinued        1,250 mg 166.7 mL/hr over 90 Minutes Intravenous Every 24 hours 04/20/21 1426 04/21/21 1301   04/20/21 1600  ceFEPIme (MAXIPIME) 2 g in sodium chloride 0.9 % 100 mL IVPB  Status:  Discontinued        2 g 200 mL/hr over 30 Minutes Intravenous Every 8 hours 04/20/21 1426 04/22/21 1348   04/20/21 1600  vancomycin (VANCOREADY) IVPB 2000  mg/400 mL        2,000 mg 200 mL/hr over 120 Minutes Intravenous  Once 04/20/21 1426 04/20/21 2030       Subjective: Without complaints. Eager to be discharged soon  Objective: Vitals:   04/24/21 0551 04/24/21 0606 04/24/21 0807 04/24/21 1326  BP: (!) 167/80  133/67 (!) 144/71  Pulse: 96  77 84  Resp: _0 Temp: 99.2 F (37.3 C)  98.1 F (36.7 C) 98.2 F (36.8 C)  TempSrc: Oral  Oral   SpO2: 100%  95% 95%  Weight:  99 kg    Height:        Intake/Output Summary (Last 24 hours) at 04/24/2021 1610 Last data filed at 04/24/2021 1100 Gross per 24 hour  Intake 240 ml  Output --  Net 240 ml    Filed Weights   04/22/21 0500 04/23/21 0500 04/24/21 0606  Weight: 103.7 kg 104 kg 99 kg    Examination: General exam: Conversant, in no acute distress Respiratory system: normal chest rise, clear, no audible wheezing Cardiovascular system: regular rhythm, s1-s2 Gastrointestinal system: Nondistended, nontender, pos BS Central nervous system: No seizures, no tremors Extremities: No cyanosis, no joint deformities Skin: No rashes, no pallor Psychiatry: Affect normal // no auditory hallucinations   Data Reviewed: I have personally reviewed following labs and imaging studies  CBC: Recent Labs  Lab 04/17/21 1728 04/17/21 1928 04/19/21 0438 04/20/21 0456 04/21/21 0443 04/22/21 0454 04/23/21 0524  WBC 7.6   < > 19.7*   19.8* 9.8 9.2 11.6* 10.5  NEUTROABS 4.2  --  16.7*  --   --   --   --   HGB 13.0   < > 11.3*   11.4* 9.7* 10.6* 11.4* 11.4*  HCT 39.9   < > 35.7*   34.9* 31.0* 32.8* 34.1* 33.8*  MCV 97.8   < > 102.0*   100.9* 100.3* 99.7 97.4 94.7  PLT 225   < > 164   214 218 220 226 215   < > = values in this interval not displayed.    Basic Metabolic Panel: Recent Labs  Lab 04/18/21 0440 04/19/21 0438 04/20/21 0456 04/21/21 0443 04/22/21 0454 04/23/21 0524 04/24/21 0707  NA 134* 139 142 141 136 136  --   K 4.1 4.0 4.3 3.8 3.1* 4.0  --   CL 105 105 109 106  103 102  --   CO2 20* _0 --   GLUCOSE 150* 168* 152* 152* 101* 102*  --   BUN 17 35* 37* 31* 23 17  --   CREATININE 1.04* 1.38* 0.84 0.62 0.70 0.56 0.67  CALCIUM 8.7* 8.8* 8.9 8.9 8.9 9.0  --   MG 2.4 2.1 2.6* 2.5* 2.2  --   --   PHOS 4.1 4.2 2.6 2.6 2.0*  --   --     GFR: Estimated Creatinine Clearance: 77.7 mL/min (by C-G formula based on SCr of 0.67 mg/dL). Liver Function Tests: Recent Labs  Lab 04/17/21 1728 04/19/21 0438 04/23/21 0524  AST 20  --  54*  ALT 11  --  60*  ALKPHOS 67  --  94  BILITOT 0.7  --  1.2  PROT 7.8  --  6.5  ALBUMIN 4.1 3.5 3.2*    No results for input(s): LIPASE, AMYLASE in the last 168 hours. No results for input(s): AMMONIA in the last 168 hours. Coagulation Profile: Recent Labs  Lab 04/17/21 1728  INR 0.9    Cardiac Enzymes: No results for input(s): CKTOTAL, CKMB, CKMBINDEX, TROPONINI in the last 168 hours. BNP (last 3 results) No results for input(s): PROBNP in the last 8760 hours. HbA1C: No results for input(s): HGBA1C in the last 72 hours. CBG: Recent Labs  Lab 04/20/21 1929 04/21/21 0715 04/21/21 1124 04/21/21 1634 04/21/21 2013  GLUCAP 116* 134* 129* 126* 117*    Lipid Profile: No results for input(s): CHOL, HDL, LDLCALC, TRIG, CHOLHDL, LDLDIRECT in the last 72 hours.  Thyroid Function Tests: No results for input(s): TSH, T4TOTAL, FREET4, T3FREE, THYROIDAB in the last 72 hours. Anemia Panel: No results for input(s): VITAMINB12, FOLATE, FERRITIN, TIBC, IRON, RETICCTPCT in the last 72 hours. Sepsis Labs: Recent Labs  Lab 04/19/21 0438 04/20/21 0456 04/21/21 0443  PROCALCITON <0.10 <0.10 <0.10     Recent Results (from the past 240 hour(s))  Resp Panel by RT-PCR (Flu A&B, Covid) Nasopharyngeal Swab     Status: None   Collection Time: 04/17/21  5:28 PM   Specimen: Nasopharyngeal Swab; Nasopharyngeal(NP) swabs in vial transport medium  Result Value Ref Range Status   SARS Coronavirus 2 by RT PCR  NEGATIVE NEGATIVE Final    Comment: (NOTE) SARS-CoV-2 target nucleic acids are NOT DETECTED.  The SARS-CoV-2 RNA is generally detectable in upper respiratory specimens during the acute phase of infection. The lowest concentration of SARS-CoV-2 viral copies this assay can detect is 138 copies/mL. A negative result does not preclude SARS-Cov-2 infection and should not be used as the sole basis for treatment or other patient management decisions. A negative result may occur  with  improper specimen collection/handling, submission of specimen other than nasopharyngeal swab, presence of viral mutation(s) within the areas targeted by this assay, and inadequate number of viral copies(<138 copies/mL). A negative result must be combined with clinical observations, patient history, and epidemiological information. The expected result is Negative.  Fact Sheet for Patients:  EntrepreneurPulse.com.au  Fact Sheet for Healthcare Providers:  IncredibleEmployment.be  This test is no t yet approved or cleared by the [place] FDA and  has been authorized for detection and/or diagnosis of SARS-CoV-2 by FDA under an Emergency Use Authorization (EUA). This EUA will remain  in effect (meaning this test can be used) for the duration of the COVID-19 declaration under Section 564(b)(1) of the Act, 21 U.S.C.section 360bbb-3(b)(1), unless the authorization is terminated  or revoked sooner.       Influenza A by PCR NEGATIVE NEGATIVE Final   Influenza B by PCR NEGATIVE NEGATIVE Final    Comment: (NOTE) The Xpert Xpress SARS-CoV-2/FLU/RSV plus assay is intended as an aid in the diagnosis of influenza from Nasopharyngeal swab specimens and should not be used as a sole basis for treatment. Nasal washings and aspirates are unacceptable for Xpert Xpress SARS-CoV-2/FLU/RSV testing.  Fact Sheet for Patients: EntrepreneurPulse.com.au  Fact Sheet for  Healthcare Providers: IncredibleEmployment.be  This test is not yet approved or cleared by the [place] FDA and has been authorized for detection and/or diagnosis of SARS-CoV-2 by FDA under an Emergency Use Authorization (EUA). This EUA will remain in effect (meaning this test can be used) for the duration of the COVID-19 declaration under Section 564(b)(1) of the Act, 21 U.S.C. section 360bbb-3(b)(1), unless the authorization is terminated or revoked.  Performed at [facility], [address] [place], [place] [address]   MRSA Next Gen by PCR, Nasal     Status: None   Collection Time: 04/17/21  8:41 PM   Specimen: Nasal Mucosa; Nasal Swab  Result Value Ref Range Status   MRSA by PCR Next Gen NOT DETECTED NOT DETECTED Final    Comment: (NOTE) The GeneXpert MRSA Assay (FDA approved for NASAL specimens only), is one component of a comprehensive MRSA colonization surveillance program. It is not intended to diagnose MRSA infection nor to guide or monitor treatment for MRSA infections. Test performance is not FDA approved in patients less than 71 years old. Performed at [facility], [address] [place], [place] [address]   Culture, Respiratory w Gram Stain     Status: None   Collection Time: 04/20/21  8:46 AM   Specimen: Tracheal Aspirate; Respiratory  Result Value Ref Range Status   Specimen Description   Final    TRACHEAL ASPIRATE Performed at [facility], [address] [place], [place] [address]    Special Requests   Final    NONE Performed at [facility], [address] [place], [place] [address]    Gram Stain   Final    ABUNDANT WBC PRESENT,BOTH PMN AND MONONUCLEAR ABUNDANT GRAM POSITIVE COCCI IN PAIRS IN CLUSTERS IN CHAINS MODERATE GRAM NEGATIVE RODS    Culture   Final    Normal respiratory flora-no Staph aureus or Pseudomonas seen Performed at [facility], [place] [address]  [place], [place] [address]    Report Status 04/22/2021 FINAL  Final      Radiology Studies: No results found.  Scheduled Meds:  albuterol  2.5 mg Nebulization BID   budesonide (PULMICORT) nebulizer solution  0.25 mg Nebulization BID  chlorhexidine gluconate (MEDLINE KIT)  15 mL Mouth Rinse BID   dexamethasone (DECADRON) injection  4 mg Intravenous Q24H   diphenhydrAMINE  12.5 mg Oral Q8H   enoxaparin (LOVENOX) injection  0.5 mg/kg Subcutaneous Q24H   famotidine  20 mg Oral BID   feeding supplement  237 mL Oral TID BM   hydrALAZINE  25 mg Oral Q8H   levothyroxine  75 mcg Per Tube Q0600   multivitamin with minerals  1 tablet Oral Daily   pantoprazole  40 mg Oral Daily   sertraline  100 mg Oral Daily   Continuous Infusions:  sodium chloride 10 mL/hr at 04/17/21 1851   sodium chloride 10 mL/hr at 04/20/21 1800     LOS: 7 days   Marylu Lund, MD [facility] Pager On Amion  If 7PM-7AM, please contact night-coverage 04/24/2021, 4:10 PM

## 2021-04-24 NOTE — TOC Progression Note (Signed)
Transition of Care [facility]) - Progression Note    Patient Details  Name: Sharon [name] MRN: [id number] Date of Birth: [birth year]  [facility] CM/SW Contact  Liliana Cline, LCSW Phone Number: 04/24/2021, 12:06 PM  Clinical Narrative:    Received VM from spouse that patient is agreeable to SNF. Started work up. Left VM for spouse with update and encouraged to call with any questions. Will follow for bed offers.   Expected Discharge Plan: Home w Home Health Services Barriers to Discharge: Continued Medical Work up  Expected Discharge Plan and Services Expected Discharge Plan: Home w Home Health Services   Discharge Planning Services: CM Consult   Living arrangements for the past 2 months: Single Family Home                 DME Arranged: N/A DME Agency: NA                   Social Determinants of Health (SDOH) Interventions    Readmission Risk Interventions No flowsheet data found.

## 2021-04-25 DIAGNOSIS — T783XXA Angioneurotic edema, initial encounter: Secondary | ICD-10-CM | POA: Diagnosis not present

## 2021-04-25 MED ORDER — HYDRALAZINE HCL 25 MG PO TABS: 25.0000 mg | ORAL_TABLET | Freq: Three times a day (TID) | ORAL | 0 refills | Status: DC

## 2021-04-25 NOTE — Discharge Instructions (Signed)
Recommend referral to allergist

## 2021-04-25 NOTE — TOC Transition Note (Signed)
Transition of Care [facility]) - CM/SW Discharge Note   Patient Details  Name: HEYLEE GREENLIEF MRN: [id number] Date of Birth: 1951/01/09  Transition of Care [facility]) CM/SW Contact:  Candie Chroman, LCSW Phone Number: 04/25/2021, 4:00 PM   Clinical Narrative:   Patient has orders to discharge home today. Advanced representative is aware. Ordered walker through [employer] to be delivered to the room before she leaves. No further concerns. CSW signing off.  Final next level of care: [place] Barriers to Discharge: Barriers Resolved   Patient Goals and CMS Choice Patient states their goals for this hospitalization and ongoing recovery are:: Patient intubated and sedated- plan for extubation today   Choice offered to / list presented to : Patient  Discharge Placement                    Patient and family notified of of transfer: 04/25/21  Discharge Plan and Services   Discharge Planning Services: CM Consult            DME Arranged: Gilford Rile rolling DME Agency: [employer] Date DME Agency Contacted: 04/25/21   Representative spoke with at DME Agency: Suanne Marker HH Arranged: PT, OT [facility] Agency: [place] ([place]) Date [place]: 04/25/21   Representative spoke with at [place]: Floydene Flock  Social Determinants of Health (SDOH) Interventions     Readmission Risk Interventions No flowsheet data found.

## 2021-04-25 NOTE — Progress Notes (Signed)
Patient discharged home going POV with husband. PIVs removed.

## 2021-04-25 NOTE — Discharge Summary (Signed)
Physician Discharge Summary  Sharon [name] ZOX:[id number] DOB: 1950/05/03 DOA: 04/17/2021  PCP: Judy Pimple, MD  Admit date: 04/17/2021 Discharge date: 04/25/2021  Admitted From: Home Disposition:  Home  Recommendations for Outpatient Follow-up:  Follow up with PCP in 1-2 weeks Recommend referral to allergist Adjust bp regimen accordingly and as tolerated as outpatient  Home Health:[facility]OT  Equipment/Devices:RW    Discharge Condition:Improved CODE STATUS:Full Diet recommendation: Regular   Brief/Interim Summary: 70yo who presented with acute respiratory failure secondary to angioedema, requiring mechanical ventilation for airway protection. Pt was extubated and now on medical service  Discharge Diagnoses:  Principal Problem:   Angioedema   Acute respiratory failure secondary to angioedema Presumed secondary to ARB, avoid further use HCTZ listed on home med, thus was recommended to hold as well Have started scheduled hydralazine for BP control for now Advised pt to f/u w/ allergist as outpatient after discharge O2 weaned to RA. Have weaned steroids to off Therapy initially recommended SNF, however pt improved to be able to go home with [facility] Leukocytosis Normalized Was initially on empiric abx for possible PNA, however pro-cal is neg Hold further abx ARF Renal function normalized Cont to follow renal function Hypothyroid Continue thyroid replacement as tolerated TSH 6.255 with normal free T4 Hypotension Presumed secondary to sedation while on vent Now on hydralazine for BP control BP now improved, tolerating hydralazine Anxiety Cont home SSRI Have ordered prn xanax while in hospital PRN anxiety   Discharge Instructions   Allergies as of 04/25/2021       Reactions   Cozaar [losartan] Swelling   Amlodipine Besylate    REACTION: edema   Atorvastatin    REACTION: muscle pain   Neomycin-bacitracin Zn-polymyx    REACTION: rash Per pt not sure if from meds or  not        Medication List     STOP taking these medications    buPROPion 150 MG 24 hr tablet Commonly known as: WELLBUTRIN XL   hydrochlorothiazide 50 MG tablet Commonly known as: HYDRODIURIL   simvastatin 80 MG tablet Commonly known as: ZOCOR       TAKE these medications    aspirin 81 MG tablet Take 81 mg by mouth daily.   calcium carbonate 750 MG chewable tablet Commonly known as: TUMS EX Chew 2 tablets by mouth at bedtime.   fluticasone 50 MCG/ACT nasal spray Commonly known as: FLONASE USE 2 SPRAYS IN BOTH  NOSTRILS DAILY   hydrALAZINE 25 MG tablet Commonly known as: APRESOLINE Take 1 tablet (25 mg total) by mouth every 8 (eight) hours.   levothyroxine 50 MCG tablet Commonly known as: SYNTHROID TAKE 1 TABLET BY MOUTH  DAILY BEFORE BREAKFAST   pantoprazole 40 MG tablet Commonly known as: PROTONIX TAKE 1 TABLET BY MOUTH  DAILY   ProAir HFA 108 (90 Base) MCG/ACT inhaler Generic drug: albuterol TAKE 2 PUFFS BY MOUTH EVERY 6 HOURS AS NEEDED FOR WHEEZE OR SHORTNESS OF BREATH   sertraline 100 MG tablet Commonly known as: ZOLOFT TAKE 1 TABLET BY MOUTH  DAILY   Vitamin D 1000 units capsule Take 2,000 Units by mouth daily.               Durable Medical Equipment  (From admission, onward)           Start     Ordered   04/25/21 1544  For home use only DME Walker rolling  Once       Question Answer Comment  Walker: With  5 Inch Wheels   Patient needs a walker to treat with the following condition Obesity (BMI 35.0-39.9 without comorbidity)      04/25/21 1543            Follow-up Information     [facility] Follow [facility].   Specialty: Home Health Services Why: They will follow up with you for your home health needs: Physical therapy and occupational therapy. Start of care Wednesday 1/4.        Tower, Audrie Gallus, MD Follow up in 1 week(s).   Specialties: Family Medicine, Radiology Why: Hospital follow up Contact  information: [address] [place] [address] [phone]                Allergies  Allergen Reactions   Cozaar [Losartan] Swelling   Amlodipine Besylate     REACTION: edema   Atorvastatin     REACTION: muscle pain   Neomycin-Bacitracin Zn-Polymyx     REACTION: rash Per pt not sure if from meds or not    Consultations: PCCM  Procedures/Studies: DG Chest Port 1 View  Result Date: 04/20/2021 CLINICAL DATA:  Hypoxia, respiratory failure EXAM: PORTABLE CHEST 1 VIEW COMPARISON:  04/19/2021 FINDINGS: Single frontal view of the chest demonstrates stable endotracheal tube and enteric catheter. Cardiac silhouette is enlarged. Continued low lung volumes, with stable bibasilar consolidation left greater than right. No effusion or pneumothorax. IMPRESSION: 1. Stable support devices. 2. Low lung volumes, with stable bibasilar consolidation consistent with atelectasis or airspace disease. Electronically Signed   By: Sharlet Salina M.D.   On: 04/20/2021 02:30   DG Chest Port 1 View  Result Date: 04/19/2021 CLINICAL DATA:  Leukocytosis. EXAM: PORTABLE CHEST 1 VIEW COMPARISON:  04/18/2021 FINDINGS: An endotracheal tube remains in place terminating 3 cm above the carina. An enteric tube courses into the stomach. The cardiomediastinal silhouette is unchanged. Lung volumes are slightly lower than on the prior study with increased, patchy opacity in the lung bases. No sizable pleural effusion or pneumothorax is identified. IMPRESSION: Low lung volumes with increased bibasilar opacities, atelectasis versus infection. Electronically Signed   By: Sebastian Ache M.D.   On: 04/19/2021 10:38   DG Chest Port 1 View  Result Date: 04/18/2021 CLINICAL DATA:  71 year old female with respiratory failure presenting with angioedema and stridor. Intubated. EXAM: PORTABLE CHEST 1 VIEW COMPARISON:  Portable chest 04/17/2021. FINDINGS: Portable AP semi upright view at 0511 hours. Endotracheal tube tip  remains in good position at the level the clavicles. Enteric tube courses to the abdomen with side hole at the level of the gastric body. Slightly lower lung volumes. Mediastinal contours remain within normal limits. Allowing for portable technique the lungs are clear. No pneumothorax or pleural effusion. No acute osseous abnormality identified. Paucity of bowel gas. IMPRESSION: 1.  Stable lines and tubes. 2. Lower lung volumes with no acute cardiopulmonary abnormality. Electronically Signed   By: Odessa Fleming M.D.   On: 04/18/2021 05:35   DG Chest Portable 1 View  Result Date: 04/17/2021 CLINICAL DATA:  Post intubation EXAM: PORTABLE CHEST 1 VIEW COMPARISON:  None. FINDINGS: Endotracheal tube tip about 2.8 cm superior to the carina. Esophageal tube tip and side port overlie the mid stomach. Patchy perihilar opacity without pleural effusion or pneumothorax. Normal cardiac size. Aortic atherosclerosis IMPRESSION: 1. Endotracheal tube tip 2.8 cm superior to carina 2. Esophageal tube tip and side port overlie the mid stomach 3. Patchy perihilar opacity, possibly due to respiratory infection Electronically Signed  By: Jasmine PangKim  Fujinaga M.D.   On: 04/17/2021 18:34    Subjective: Eager to go home  Discharge Exam: Vitals:   04/25/21 0810 04/25/21 1242  BP: 126/60 113/73  Pulse: 87 95  Resp: 16 17  Temp: 98.6 F (37 C) 98.2 F (36.8 C)  SpO2: 96% 98%   Vitals:   04/25/21 0500 04/25/21 0504 04/25/21 0810 04/25/21 1242  BP:  133/62 126/60 113/73  Pulse:  89 87 95  Resp:  18 16 17   Temp:  98.8 F (37.1 C) 98.6 F (37 C) 98.2 F (36.8 C)  TempSrc:  Oral Oral Oral  SpO2:  92% 96% 98%  Weight: 98.7 kg     Height:        General: Pt is alert, awake, not in acute distress Cardiovascular: RRR, S1/S2  Respiratory: CTA bilaterally, no wheezing, no rhonchi Abdominal: Soft, NT, ND, bowel sounds + Extremities: no edema, no cyanosis   The results of significant diagnostics from this hospitalization  (including imaging, microbiology, ancillary and laboratory) are listed below for reference.     Microbiology: Recent Results (from the past 240 hour(s))  Resp Panel by RT-PCR (Flu A&B, Covid) Nasopharyngeal Swab     Status: None   Collection Time: 04/17/21  5:28 PM   Specimen: Nasopharyngeal Swab; Nasopharyngeal(NP) swabs in vial transport medium  Result Value Ref Range Status   SARS Coronavirus 2 by RT PCR NEGATIVE NEGATIVE Final    Comment: (NOTE) SARS-CoV-2 target nucleic acids are NOT DETECTED.  The SARS-CoV-2 RNA is generally detectable in upper respiratory specimens during the acute phase of infection. The lowest concentration of SARS-CoV-2 viral copies this assay can detect is 138 copies/mL. A negative result does not preclude SARS-Cov-2 infection and should not be used as the sole basis for treatment or other patient management decisions. A negative result may occur with  improper specimen collection/handling, submission of specimen other than nasopharyngeal swab, presence of viral mutation(s) within the areas targeted by this assay, and inadequate number of viral copies(<138 copies/mL). A negative result must be combined with clinical observations, patient history, and epidemiological information. The expected result is Negative.  Fact Sheet for Patients:  BloggerCourse.comhttps://www.fda.gov/media/152166/download  Fact Sheet for Healthcare Providers:  SeriousBroker.ithttps://www.fda.gov/media/152162/download  This test is no t yet approved or cleared by the [place] FDA and  has been authorized for detection and/or diagnosis of SARS-CoV-2 by FDA under an Emergency Use Authorization (EUA). This EUA will remain  in effect (meaning this test can be used) for the duration of the COVID-19 declaration under Section 564(b)(1) of the Act, 21 U.S.C.section 360bbb-3(b)(1), unless the authorization is terminated  or revoked sooner.       Influenza A by PCR NEGATIVE NEGATIVE Final   Influenza B by PCR  NEGATIVE NEGATIVE Final    Comment: (NOTE) The Xpert Xpress SARS-CoV-2/FLU/RSV plus assay is intended as an aid in the diagnosis of influenza from Nasopharyngeal swab specimens and should not be used as a sole basis for treatment. Nasal washings and aspirates are unacceptable for Xpert Xpress SARS-CoV-2/FLU/RSV testing.  Fact Sheet for Patients: BloggerCourse.comhttps://www.fda.gov/media/152166/download  Fact Sheet for Healthcare Providers: SeriousBroker.ithttps://www.fda.gov/media/152162/download  This test is not yet approved or cleared by the [place] FDA and has been authorized for detection and/or diagnosis of SARS-CoV-2 by FDA under an Emergency Use Authorization (EUA). This EUA will remain in effect (meaning this test can be used) for the duration of the COVID-19 declaration under Section 564(b)(1) of the Act, 21 U.S.C. section 360bbb-3(b)(1), unless the  authorization is terminated or revoked.  Performed at [facility], [address] [place], [place] [address]   MRSA Next Gen by PCR, Nasal     Status: None   Collection Time: 04/17/21  8:41 PM   Specimen: Nasal Mucosa; Nasal Swab  Result Value Ref Range Status   MRSA by PCR Next Gen NOT DETECTED NOT DETECTED Final    Comment: (NOTE) The GeneXpert MRSA Assay (FDA approved for NASAL specimens only), is one component of a comprehensive MRSA colonization surveillance program. It is not intended to diagnose MRSA infection nor to guide or monitor treatment for MRSA infections. Test performance is not FDA approved in patients less than 51 years old. Performed at [facility], [address] [place], [place] [address]   Culture, Respiratory w Gram Stain     Status: None   Collection Time: 04/20/21  8:46 AM   Specimen: Tracheal Aspirate; Respiratory  Result Value Ref Range Status   Specimen Description   Final    TRACHEAL ASPIRATE Performed at [facility], [address], [place], [place] [address]    Special  Requests   Final    NONE Performed at [facility], [address] [place], [place] [address]    Gram Stain   Final    ABUNDANT WBC PRESENT,BOTH PMN AND MONONUCLEAR ABUNDANT GRAM POSITIVE COCCI IN PAIRS IN CLUSTERS IN CHAINS MODERATE GRAM NEGATIVE RODS    Culture   Final    Normal respiratory flora-no Staph aureus or Pseudomonas seen Performed at [facility], [address] [place], [place] [address]    Report Status 04/22/2021 FINAL  Final     Labs: BNP (last 3 results) Recent Labs    04/17/21 1933  BNP 42.0   Basic Metabolic Panel: Recent Labs  Lab 04/19/21 0438 04/20/21 0456 04/21/21 0443 04/22/21 0454 04/23/21 0524 04/24/21 0707  NA 139 142 141 136 136  --   K 4.0 4.3 3.8 3.1* 4.0  --   CL 105 109 106 103 102  --   CO2 25 27 27 27 25   --   GLUCOSE 168* 152* 152* 101* 102*  --   BUN 35* 37* 31* 23 17  --   CREATININE 1.38* 0.84 0.62 0.70 0.56 0.67  CALCIUM 8.8* 8.9 8.9 8.9 9.0  --   MG 2.1 2.6* 2.5* 2.2  --   --   PHOS 4.2 2.6 2.6 2.0*  --   --    Liver Function Tests: Recent Labs  Lab 04/19/21 0438 04/23/21 0524  AST  --  54*  ALT  --  60*  ALKPHOS  --  94  BILITOT  --  1.2  PROT  --  6.5  ALBUMIN 3.5 3.2*   No results for input(s): LIPASE, AMYLASE in the last 168 hours. No results for input(s): AMMONIA in the last 168 hours. CBC: Recent Labs  Lab 04/19/21 0438 04/20/21 0456 04/21/21 0443 04/22/21 0454 04/23/21 0524  WBC 19.7*   19.8* 9.8 9.2 11.6* 10.5  NEUTROABS 16.7*  --   --   --   --   HGB 11.3*   11.4* 9.7* 10.6* 11.4* 11.4*  HCT 35.7*   34.9* 31.0* 32.8* 34.1* 33.8*  MCV 102.0*   100.9* 100.3* 99.7 97.4 94.7  PLT 164   214 218 220 226 215   Cardiac Enzymes: No results for input(s): CKTOTAL, CKMB, CKMBINDEX, TROPONINI in the last 168 hours. BNP: Invalid input(s): POCBNP CBG: Recent  Labs  Lab 04/20/21 1929 04/21/21 0715 04/21/21 1124 04/21/21 1634 04/21/21 2013  GLUCAP 116* 134* 129* 126* 117*    D-Dimer No results for input(s): DDIMER in the last 72 hours. Hgb A1c No results for input(s): HGBA1C in the last 72 hours. Lipid Profile No results for input(s): CHOL, HDL, LDLCALC, TRIG, CHOLHDL, LDLDIRECT in the last 72 hours. Thyroid function studies No results for input(s): TSH, T4TOTAL, T3FREE, THYROIDAB in the last 72 hours.  Invalid input(s): FREET3 Anemia work up No results for input(s): VITAMINB12, FOLATE, FERRITIN, TIBC, IRON, RETICCTPCT in the last 72 hours. Urinalysis    Component Value Date/Time   COLORURINE YELLOW 04/17/2021 1933   APPEARANCEUR CLEAR 04/17/2021 1933   LABSPEC >1.030 (H) 04/17/2021 1933   PHURINE 6.0 04/17/2021 1933   GLUCOSEU NEGATIVE 04/17/2021 1933   HGBUR NEGATIVE 04/17/2021 1933   HGBUR large 01/31/2007 1019   BILIRUBINUR NEGATIVE 04/17/2021 1933   KETONESUR NEGATIVE 04/17/2021 1933   PROTEINUR 30 (A) 04/17/2021 1933   UROBILINOGEN 0.2 01/31/2007 1019   NITRITE NEGATIVE 04/17/2021 1933   LEUKOCYTESUR NEGATIVE 04/17/2021 1933   Sepsis Labs Invalid input(s): PROCALCITONIN,  WBC,  LACTICIDVEN Microbiology Recent Results (from the past 240 hour(s))  Resp Panel by RT-PCR (Flu A&B, Covid) Nasopharyngeal Swab     Status: None   Collection Time: 04/17/21  5:28 PM   Specimen: Nasopharyngeal Swab; Nasopharyngeal(NP) swabs in vial transport medium  Result Value Ref Range Status   SARS Coronavirus 2 by RT PCR NEGATIVE NEGATIVE Final    Comment: (NOTE) SARS-CoV-2 target nucleic acids are NOT DETECTED.  The SARS-CoV-2 RNA is generally detectable in upper respiratory specimens during the acute phase of infection. The lowest concentration of SARS-CoV-2 viral copies this assay can detect is 138 copies/mL. A negative result does not preclude SARS-Cov-2 infection and should not be used as the sole basis for treatment or other patient management decisions. A negative result may occur with  improper specimen collection/handling, submission of specimen  other than nasopharyngeal swab, presence of viral mutation(s) within the areas targeted by this assay, and inadequate number of viral copies(<138 copies/mL). A negative result must be combined with clinical observations, patient history, and epidemiological information. The expected result is Negative.  Fact Sheet for Patients:  BloggerCourse.comhttps://www.fda.gov/media/152166/download  Fact Sheet for Healthcare Providers:  SeriousBroker.ithttps://www.fda.gov/media/152162/download  This test is no t yet approved or cleared by the [place] FDA and  has been authorized for detection and/or diagnosis of SARS-CoV-2 by FDA under an Emergency Use Authorization (EUA). This EUA will remain  in effect (meaning this test can be used) for the duration of the COVID-19 declaration under Section 564(b)(1) of the Act, 21 U.S.C.section 360bbb-3(b)(1), unless the authorization is terminated  or revoked sooner.       Influenza A by PCR NEGATIVE NEGATIVE Final   Influenza B by PCR NEGATIVE NEGATIVE Final    Comment: (NOTE) The Xpert Xpress SARS-CoV-2/FLU/RSV plus assay is intended as an aid in the diagnosis of influenza from Nasopharyngeal swab specimens and should not be used as a sole basis for treatment. Nasal washings and aspirates are unacceptable for Xpert Xpress SARS-CoV-2/FLU/RSV testing.  Fact Sheet for Patients: BloggerCourse.comhttps://www.fda.gov/media/152166/download  Fact Sheet for Healthcare Providers: SeriousBroker.ithttps://www.fda.gov/media/152162/download  This test is not yet approved or cleared by the [place] FDA and has been authorized for detection and/or diagnosis of SARS-CoV-2 by FDA under an Emergency Use Authorization (EUA). This EUA will remain in effect (meaning this test can be used) for the duration of the COVID-19  declaration under Section 564(b)(1) of the Act, 21 U.S.C. section 360bbb-3(b)(1), unless the authorization is terminated or revoked.  Performed at [facility], [address]  [place], [place] [address]   MRSA Next Gen by PCR, Nasal     Status: None   Collection Time: 04/17/21  8:41 PM   Specimen: Nasal Mucosa; Nasal Swab  Result Value Ref Range Status   MRSA by PCR Next Gen NOT DETECTED NOT DETECTED Final    Comment: (NOTE) The GeneXpert MRSA Assay (FDA approved for NASAL specimens only), is one component of a comprehensive MRSA colonization surveillance program. It is not intended to diagnose MRSA infection nor to guide or monitor treatment for MRSA infections. Test performance is not FDA approved in patients less than 73 years old. Performed at [facility], [address] [place], [place] [address]   Culture, Respiratory w Gram Stain     Status: None   Collection Time: 04/20/21  8:46 AM   Specimen: Tracheal Aspirate; Respiratory  Result Value Ref Range Status   Specimen Description   Final    TRACHEAL ASPIRATE Performed at [facility], [address], [place], [place] [address]    Special Requests   Final    NONE Performed at [facility], [address] [place], [place] [address]    Gram Stain   Final    ABUNDANT WBC PRESENT,BOTH PMN AND MONONUCLEAR ABUNDANT GRAM POSITIVE COCCI IN PAIRS IN CLUSTERS IN CHAINS MODERATE GRAM NEGATIVE RODS    Culture   Final    Normal respiratory flora-no Staph aureus or Pseudomonas seen Performed at [facility], [address] [place], [place] [address]    Report Status 04/22/2021 FINAL  Final   Time spent: 30 min  SIGNED:   Rickey Kita, MD  [facility] 04/25/2021, 3:49 PM  If 7PM-7AM, please contact night-coverage

## 2021-04-25 NOTE — TOC Progression Note (Addendum)
Transition of Care [facility]) - Progression Note    Patient Details  Name: OAKLEE ESTHER MRN: [id number] Date of Birth: 1951/02/02  [facility] of [facility]) CM/SW Contact  Margarito Liner, LCSW Phone Number: 04/25/2021, 8:54 AM  Clinical Narrative:   No bed offers this morning. Sent therapy notes to try and trigger responses.  2:45 pm: PT/OT recommendations changed to home health. Patient is agreeable. She has not worked with home health in the past. [facility] is reviewing referral. Patient is agreeable to RW. She already has a shower chair.  3:09 pm: [facility] can accept for PT and OT. Start of care Wednesday 1/4. Patient is aware and agreeable. Asked MD for home health and DME orders.  Expected Discharge Plan: Home w Home Health Services Barriers to Discharge: Continued Medical Work up  Expected Discharge Plan and Services Expected Discharge Plan: Home w Home Health Services   Discharge Planning Services: CM Consult   Living arrangements for the past 2 months: Single Family Home                 DME Arranged: N/A DME Agency: NA                   Social Determinants of Health (SDOH) Interventions    Readmission Risk Interventions No flowsheet data found.

## 2021-04-25 NOTE — Progress Notes (Signed)
Occupational Therapy Treatment Patient Details Name: Sharon [name] MRN: [id number] DOB: [birth year] Today's Date: 04/25/2021   History of present illness Pt is a 71 y.o. female admitted with Acute Respiratory Failure secondary to airway compromise from angioedema in the setting of suspected allergic reaction from ARB requiring intubation for airway protection. PMH of cateracts, anxiety, GERD, HLD, HTN.   OT comments  Pt seen for OT tx. Pt received in bed, eager to discharge home when able. Pt demonstrated improvements in ADL and mobility this date as compared to previous session with this therapist. Pt performed bed mobility with supv, ADL transfers from EOB and from [facility] over the toilet with SBA-CGA + RW. Pt negotiated obstacles in her room from bed to bathroom with CGA and no LOB noted. Pt denied SOB or pain during session. Discharge recommendations updated based on progress towards OT goals. Now recommend [place] services. Pt will benefit from 24/7 sup/assist from family to maximize safety 2/2 mild problem solving impairments noted. Pt in agreement. TOC notified.    Recommendations for follow up therapy are one component of a multi-disciplinary discharge planning process, led by the attending physician.  Recommendations may be updated based on patient status, additional functional criteria and insurance authorization.    Follow Up Recommendations  Home health OT    Assistance Recommended at Discharge Frequent or constant Supervision/Assistance  Equipment Recommendations  Tub/shower seat    Recommendations for Other Services      Precautions / Restrictions Precautions Precautions: Fall Restrictions Weight Bearing Restrictions: No       Mobility Bed Mobility Overal bed mobility: Needs Assistance Bed Mobility: Supine to Sit;Sit to Supine     Supine to sit: Supervision Sit to supine: Supervision   General bed mobility comments: use of bed rails    Transfers Overall transfer  level: Needs assistance Equipment used: Rolling walker (2 wheels) Transfers: Sit to/from Stand Sit to Stand: Supervision   Step pivot transfers: Supervision       General transfer comment: cues for safe hand placement     Balance Overall balance assessment: Needs assistance Sitting-balance support: Feet supported Sitting balance-Leahy Scale: Good     Standing balance support: During functional activity;No upper extremity supported Standing balance-Leahy Scale: Fair Standing balance comment: fair static standing balance while washing her hands at the sink while standing within the RW frame                           ADL either performed or assessed with clinical judgement   ADL Overall ADL's : Needs assistance/impaired     Grooming: Standing;Supervision/safety;Wash/dry Product manager (2 wheels);Ambulation;Supervision/safety;Min guard Armed forces technical officer Details (indicate cue type and reason): BSC over toilet Toileting- Clothing Manipulation and Hygiene: Sitting/lateral lean;Supervision/safety              Extremity/Trunk Assessment              Vision       Perception     Praxis      Cognition Arousal/Alertness: Awake/alert Behavior During Therapy: WFL for tasks assessed/performed Overall Cognitive Status: No family/caregiver present to determine baseline cognitive functioning                                 General Comments: pt demo's some  decreased problem solving skills, requiring increased time and PRN VC to help sequence          Exercises    Shoulder Instructions       General Comments      Pertinent Vitals/ Pain       Pain Assessment: No/denies pain  Home Living                                          Prior Functioning/Environment              Frequency  Min 2X/week        Progress Toward Goals  OT Goals(current goals can now be  found in the care plan section)  Progress towards OT goals: Progressing toward goals  Acute Rehab OT Goals Patient Stated Goal: get stronger and go home OT Goal Formulation: With patient Time For Goal Achievement: 05/06/21 Potential to Achieve Goals: Good  Plan Discharge plan needs to be updated;Frequency remains appropriate    Co-evaluation                 AM-PAC OT "6 Clicks" Daily Activity     Outcome Measure   Help from another person eating meals?: None Help from another person taking care of personal grooming?: A Little Help from another person toileting, which includes using toliet, bedpan, or urinal?: A Little Help from another person bathing (including washing, rinsing, drying)?: A Lot Help from another person to put on and taking off regular upper body clothing?: A Little Help from another person to put on and taking off regular lower body clothing?: A Lot 6 Click Score: 17    End of Session Equipment Utilized During Treatment: Gait belt;Rolling walker (2 wheels)  OT Visit Diagnosis: Other abnormalities of gait and mobility (R26.89);Repeated falls (R29.6);Muscle weakness (generalized) (M62.81)   Activity Tolerance Patient tolerated treatment well   Patient Left in bed;with call bell/phone within reach;with bed alarm set   Nurse Communication          Time: [id number] OT Time Calculation (min): 10 min  Charges: OT General Charges $OT Visit: 1 Visit OT Treatments $Self Care/Home Management : 8-22 mins  Ardeth Perfect., MPH, MS, OTR/L ascom [phone] 04/25/21, 2:44 PM

## 2021-04-25 NOTE — Progress Notes (Addendum)
Physical Therapy Treatment Patient Details Name: Sharon [name] MRN: [id number] DOB: 06-09-1950 Today's Date: 04/25/2021   History of Present Illness Pt is a 71 y.o. female admitted with Acute Respiratory Failure secondary to airway compromise from angioedema in the setting of suspected allergic reaction from ARB requiring intubation for airway protection. PMH of cateracts, anxiety, GERD, HLD, HTN.    PT Comments    Patient alert, agreeable to PT, oriented x4, though did have some difficulty navigating her phone. Able to follow all commands, and demonstrated significant improvement in function. She was able to perform supine to sit with supervision, use of bed rails. Good sitting balance noted, able to perform several seated therapeutic exercises. Sit <> Stand with supervision and RW, cued for hand placement with great carryover. Pt able to ambulate ~244ft total with RW and supervision, no LOB noted, HR and spO2 WFLs. Returned to room, up in chair with all needs in reach. Discharge recommendations updated to HHPT with intermittent assistance/supervision. MD, CSW updated of changes, pt eager to go home.     Recommendations for follow up therapy are one component of a multi-disciplinary discharge planning process, led by the attending physician.  Recommendations may be updated based on patient status, additional functional criteria and insurance authorization.  Follow Up Recommendations  Home health PT     Assistance Recommended at Discharge Intermittent Supervision/Assistance  Equipment Recommendations  Rolling walker (2 wheels)    Recommendations for Other Services OT consult     Precautions / Restrictions Precautions Precautions: Fall Restrictions Weight Bearing Restrictions: No     Mobility  Bed Mobility Overal bed mobility: Needs Assistance Bed Mobility: Supine to Sit;Sit to Supine     Supine to sit: Supervision Sit to supine: Supervision   General bed mobility  comments: use of bed rails    Transfers Overall transfer level: Needs assistance Equipment used: Rolling walker (2 wheels) Transfers: Sit to/from Stand Sit to Stand: Supervision     Step pivot transfers: Supervision     General transfer comment: cues for safe hand placement    Ambulation/Gait Ambulation/Gait assistance: Supervision Gait Distance (Feet): 200 Feet Assistive device: Rolling walker (2 wheels)         General Gait Details: pt HR and spO2 WFLs for all mobility today.   Stairs             Wheelchair Mobility    Modified Rankin (Stroke Patients Only)       Balance Overall balance assessment: Needs assistance Sitting-balance support: Feet supported Sitting balance-Leahy Scale: Good     Standing balance support: During functional activity;No upper extremity supported Standing balance-Leahy Scale: Fair Standing balance comment: fair static standing balance while washing her hands at the sink while standing within the RW frame                            Cognition Arousal/Alertness: Awake/alert Behavior During Therapy: WFL for tasks assessed/performed Overall Cognitive Status: No family/caregiver present to determine baseline cognitive functioning                                 General Comments: pt demo's some decreased problem solving skills, requiring increased time and PRN VC to help sequence        Exercises Other Exercises Other Exercises: seated LAQ, marching, ankle pumps x10 bilaterally    General Comments  Pertinent Vitals/Pain Pain Assessment: No/denies pain    Home Living                          Prior Function            PT Goals (current goals can now be found in the care plan section) Progress towards PT goals: Progressing toward goals    Frequency    Min 2X/week      PT Plan Discharge plan needs to be updated    Co-evaluation              AM-PAC PT "6  Clicks" Mobility   Outcome Measure  Help needed turning from your back to your side while in a flat bed without using bedrails?: None Help needed moving from lying on your back to sitting on the side of a flat bed without using bedrails?: None Help needed moving to and from a bed to a chair (including a wheelchair)?: None Help needed standing up from a chair using your arms (e.g., wheelchair or bedside chair)?: None Help needed to walk in hospital room?: None Help needed climbing 3-5 steps with a railing? : A Little 6 Click Score: 23    End of Session Equipment Utilized During Treatment: Gait belt Activity Tolerance: Patient tolerated treatment well Patient left: in chair;with call bell/phone within reach;with chair alarm set Nurse Communication: Mobility status PT Visit Diagnosis: Other abnormalities of gait and mobility (R26.89);Difficulty in walking, not elsewhere classified (R26.2);Muscle weakness (generalized) (M62.81)     Time: [id number] PT Time Calculation (min) (ACUTE ONLY): 25 min  Charges:  $Therapeutic Exercise: 8-22 mins $Therapeutic Activity: 8-22 mins                     Lieutenant Diego PT, DPT 2:36 PM,04/25/21

## 2021-04-26 ENCOUNTER — Telehealth: Payer: Self-pay | Admitting: Family Medicine

## 2021-04-26 NOTE — Telephone Encounter (Signed)
VO given to Tiffany  

## 2021-04-26 NOTE — Telephone Encounter (Signed)
Tiffany with [facility] called stating that they had verbal orders for PT and OT. Tiffany stated that pt didn't want them to come until 04/28/20. Sharon [name] is asking is that ok. Please advise.

## 2021-04-26 NOTE — Telephone Encounter (Signed)
Please ok those verbal orders to start 1/5

## 2021-04-27 ENCOUNTER — Observation Stay
Admission: EM | Admit: 2021-04-27 | Discharge: 2021-04-28 | Disposition: A | Discharge status: Home / Self Care | Payer: Medicare Other | Attending: Internal Medicine | Admitting: Internal Medicine

## 2021-04-27 ENCOUNTER — Ambulatory Visit (INDEPENDENT_AMBULATORY_CARE_PROVIDER_SITE_OTHER): Payer: Medicare Other | Admitting: Family Medicine

## 2021-04-27 ENCOUNTER — Other Ambulatory Visit: Payer: Self-pay

## 2021-04-27 ENCOUNTER — Encounter: Payer: Self-pay | Admitting: Family Medicine

## 2021-04-27 ENCOUNTER — Telehealth: Payer: Self-pay

## 2021-04-27 ENCOUNTER — Encounter: Payer: Self-pay | Admitting: Emergency Medicine

## 2021-04-27 VITALS — BP 136/80 | HR 84 | Temp 98.3°F | Ht 65.5 in | Wt 215.4 lb

## 2021-04-27 DIAGNOSIS — E039 Hypothyroidism, unspecified: Secondary | ICD-10-CM | POA: Diagnosis not present

## 2021-04-27 DIAGNOSIS — T783XXA Angioneurotic edema, initial encounter: Secondary | ICD-10-CM | POA: Diagnosis not present

## 2021-04-27 DIAGNOSIS — T783XXD Angioneurotic edema, subsequent encounter: Secondary | ICD-10-CM

## 2021-04-27 DIAGNOSIS — Z20822 Contact with and (suspected) exposure to covid-19: Secondary | ICD-10-CM | POA: Diagnosis not present

## 2021-04-27 DIAGNOSIS — R9431 Abnormal electrocardiogram [ECG] [EKG]: Secondary | ICD-10-CM | POA: Diagnosis not present

## 2021-04-27 DIAGNOSIS — K219 Gastro-esophageal reflux disease without esophagitis: Secondary | ICD-10-CM | POA: Insufficient documentation

## 2021-04-27 DIAGNOSIS — I10 Essential (primary) hypertension: Secondary | ICD-10-CM | POA: Insufficient documentation

## 2021-04-27 DIAGNOSIS — Z87891 Personal history of nicotine dependence: Secondary | ICD-10-CM | POA: Diagnosis not present

## 2021-04-27 DIAGNOSIS — Z7982 Long term (current) use of aspirin: Secondary | ICD-10-CM | POA: Insufficient documentation

## 2021-04-27 DIAGNOSIS — F411 Generalized anxiety disorder: Secondary | ICD-10-CM | POA: Diagnosis not present

## 2021-04-27 DIAGNOSIS — R197 Diarrhea, unspecified: Secondary | ICD-10-CM | POA: Insufficient documentation

## 2021-04-27 DIAGNOSIS — T7840XA Allergy, unspecified, initial encounter: Secondary | ICD-10-CM | POA: Diagnosis present

## 2021-04-27 DIAGNOSIS — Z79899 Other long term (current) drug therapy: Secondary | ICD-10-CM | POA: Diagnosis not present

## 2021-04-27 DIAGNOSIS — D649 Anemia, unspecified: Secondary | ICD-10-CM | POA: Diagnosis not present

## 2021-04-27 MED ORDER — EPINEPHRINE 0.3 MG/0.3ML IJ SOAJ
0.3000 mg | Freq: Once | INTRAMUSCULAR | Status: AC
  Administered 2021-04-27: 0.3 mg via INTRAMUSCULAR
  Filled 2021-04-27: qty 0.3

## 2021-04-27 MED ORDER — EPINEPHRINE 0.3 MG/0.3ML IJ SOAJ: 0.3000 mg | INTRAMUSCULAR | 1 refills | Status: DC | PRN

## 2021-04-27 MED ORDER — DIPHENHYDRAMINE HCL 50 MG/ML IJ SOLN
25.0000 mg | Freq: Once | INTRAMUSCULAR | Status: AC
  Administered 2021-04-27: 25 mg via INTRAVENOUS
  Filled 2021-04-27: qty 1

## 2021-04-27 MED ORDER — METHYLPREDNISOLONE SODIUM SUCC 125 MG IJ SOLR
125.0000 mg | Freq: Once | INTRAMUSCULAR | Status: AC
  Administered 2021-04-27: 125 mg via INTRAVENOUS
  Filled 2021-04-27: qty 2

## 2021-04-27 MED ORDER — ALBUTEROL SULFATE HFA 108 (90 BASE) MCG/ACT IN AERS
INHALATION_SPRAY | RESPIRATORY_TRACT | 2 refills | Status: DC
Start: 2021-04-27 — End: 2022-06-13

## 2021-04-27 MED ORDER — FAMOTIDINE IN NACL 20-0.9 MG/50ML-% IV SOLN
20.0000 mg | Freq: Once | INTRAVENOUS | Status: AC
  Administered 2021-04-27: 20 mg via INTRAVENOUS
  Filled 2021-04-27: qty 50

## 2021-04-27 NOTE — ED Triage Notes (Signed)
Patient ambulatory to triage with steady gait, without difficulty or distress noted; pt reports onset 8pm of tongue swelling; no meds taken PTA (has epi pen at home); seen recently for same

## 2021-04-27 NOTE — Assessment & Plan Note (Signed)
bp in fair control at this time  BP Readings from Last 1 Encounters:  04/27/21 136/80   Off ARB and hctz due to angioedema  Now tolerating hydralazine so far 25 mg tid  Most recent labs reviewed  Disc lifstyle change with low sodium diet and exercise

## 2021-04-27 NOTE — Progress Notes (Signed)
Subjective:    Patient ID: Sharon [name], female    DOB: 27-Jan-1951, 71 y.o.   MRN: [id number]  This visit occurred during the SARS-CoV-2 public health emergency.  Safety protocols were in place, including screening questions prior to the visit, additional usage of staff PPE, and extensive cleaning of exam room while observing appropriate contact time as indicated for disinfecting solutions.   HPI Pt presents for f/u of hospitalization for allergic reaction   Wt Readings from Last 3 Encounters:  04/27/21 215 lb 6 oz (97.7 kg)  04/25/21 217 lb 11.2 oz (98.7 kg)  09/21/20 212 lb 4 oz (96.3 kg)   35.30 kg/m   Hospitalized from 12/25 to 1/2 for angioedema  Had noted some tongue swelling after dinner  Tx with decadron and H1, H2 blockers  She presented with acute resp failure and required mechanical ventilation for airway protection   Discharge dx: Principal Problem:   Angioedema   Acute respiratory failure secondary to angioedema Presumed secondary to ARB, avoid further use  (she ate ham and potato salad, asparagus, nothing out of the ordinary)  HCTZ listed on home med, thus was recommended to hold as well Have started scheduled hydralazine for BP control for now Advised pt to f/u w/ allergist as outpatient after discharge O2 weaned to RA. Have weaned steroids to off Therapy initially recommended SNF, however pt improved to be able to go home with [facility] Leukocytosis Normalized Was initially on empiric abx for possible PNA, however pro-cal is neg Hold further abx ARF Renal function normalized Cont to follow renal function Hypothyroid Continue thyroid replacement as tolerated TSH 6.255 with normal free T4 Hypotension Presumed secondary to sedation while on vent Now on hydralazine for BP control BP now improved, tolerating hydralazine Anxiety Cont home SSRI Have ordered prn xanax while in hospital PRN anxiety  She will need a referral to allergist   Was d/c with a  rolling walker and home care  Does well with that   Lab Results  Component Value Date   CREATININE 0.67 04/24/2021   BUN 17 04/23/2021   NA 136 04/23/2021   K 4.0 04/23/2021   CL 102 04/23/2021   CO2 25 04/23/2021   Lab Results  Component Value Date   ALT 60 (H) 04/23/2021   AST 54 (H) 04/23/2021   ALKPHOS 94 04/23/2021   BILITOT 1.2 04/23/2021   Lab Results  Component Value Date   WBC 10.5 04/23/2021   HGB 11.4 (L) 04/23/2021   HCT 33.8 (L) 04/23/2021   MCV 94.7 04/23/2021   PLT 215 04/23/2021   DG Chest Port 1 View  Result Date: 04/20/2021 CLINICAL DATA:  Hypoxia, respiratory failure EXAM: PORTABLE CHEST 1 VIEW COMPARISON:  04/19/2021 FINDINGS: Single frontal view of the chest demonstrates stable endotracheal tube and enteric catheter. Cardiac silhouette is enlarged. Continued low lung volumes, with stable bibasilar consolidation left greater than right. No effusion or pneumothorax. IMPRESSION: 1. Stable support devices. 2. Low lung volumes, with stable bibasilar consolidation consistent with atelectasis or airspace disease. Electronically Signed   By: Randa Ngo M.D.   On: 04/20/2021 02:30   DG Chest Port 1 View  Result Date: 04/19/2021 CLINICAL DATA:  Leukocytosis. EXAM: PORTABLE CHEST 1 VIEW COMPARISON:  04/18/2021 FINDINGS: An endotracheal tube remains in place terminating 3 cm above the carina. An enteric tube courses into the stomach. The cardiomediastinal silhouette is unchanged. Lung volumes are slightly lower than on the prior study with increased, patchy opacity in the  lung bases. No sizable pleural effusion or pneumothorax is identified. IMPRESSION: Low lung volumes with increased bibasilar opacities, atelectasis versus infection. Electronically Signed   By: Logan Bores M.D.   On: 04/19/2021 10:38   DG Chest Port 1 View  Result Date: 04/18/2021 CLINICAL DATA:  71 year old female with respiratory failure presenting with angioedema and stridor. Intubated. EXAM:  PORTABLE CHEST 1 VIEW COMPARISON:  Portable chest 04/17/2021. FINDINGS: Portable AP semi upright view at 0511 hours. Endotracheal tube tip remains in good position at the level the clavicles. Enteric tube courses to the abdomen with side hole at the level of the gastric body. Slightly lower lung volumes. Mediastinal contours remain within normal limits. Allowing for portable technique the lungs are clear. No pneumothorax or pleural effusion. No acute osseous abnormality identified. Paucity of bowel gas. IMPRESSION: 1.  Stable lines and tubes. 2. Lower lung volumes with no acute cardiopulmonary abnormality. Electronically Signed   By: Genevie Ann M.D.   On: 04/18/2021 05:35   DG Chest Portable 1 View  Result Date: 04/17/2021 CLINICAL DATA:  Post intubation EXAM: PORTABLE CHEST 1 VIEW COMPARISON:  None. FINDINGS: Endotracheal tube tip about 2.8 cm superior to the carina. Esophageal tube tip and side port overlie the mid stomach. Patchy perihilar opacity without pleural effusion or pneumothorax. Normal cardiac size. Aortic atherosclerosis IMPRESSION: 1. Endotracheal tube tip 2.8 cm superior to carina 2. Esophageal tube tip and side port overlie the mid stomach 3. Patchy perihilar opacity, possibly due to respiratory infection Electronically Signed   By: Donavan Foil M.D.   On: 04/17/2021 18:34    Blood pressure BP Readings from Last 3 Encounters:  04/27/21 136/80  04/25/21 113/73  09/21/20 120/78   Pulse Readings from Last 3 Encounters:  04/27/21 84  04/25/21 95  09/21/20 82    Still light headed and out of it  Diarrhea also - since she came home  No blood  ? If mucous   Some exercise intolerance and wheezing  Especially when trying to sleep  She is done with steroids  On flonase   Taking levothyroxine 50 mcg daily  Lab Results  Component Value Date   TSH 6.255 (H) 04/17/2021     Patient Active Problem List   Diagnosis Date Noted   Diarrhea 04/27/2021   Angioedema 04/17/2021    Dizziness 08/23/2020   Fatigue 08/23/2020   Elevated fasting glucose 12/17/2019   Encounter for hepatitis C screening test for low risk patient 12/17/2019   Medicare annual wellness visit, subsequent 08/28/2018   Cough 01/28/2018   Welcome to Medicare preventive visit 08/22/2016   Obesity 10/05/2014   Colon cancer screening 03/03/2013   Hypokalemia 10/09/2011   Routine general medical examination at a health care facility 10/02/2011   Vitamin D deficiency disease 12/28/2010   ARTHRALGIA 08/19/2007   Hypothyroidism 01/31/2007   Hyperlipidemia 01/31/2007   ANXIETY 01/31/2007   Essential hypertension 01/31/2007   GERD 01/31/2007   Past Medical History:  Diagnosis Date   Anxiety    Cataract    GERD (gastroesophageal reflux disease)    Hyperlipidemia    Hypertension    Hypothyroidism    Vitamin D deficiency    Past Surgical History:  Procedure Laterality Date   CATARACT EXTRACTION W/ INTRAOCULAR LENS IMPLANT Right 12/29/2016   Dr. Darleen Crocker   CATARACT EXTRACTION W/ INTRAOCULAR LENS IMPLANT Left 01/15/2017   Dr. Darleen Crocker   COLONOSCOPY     LEEP     TUBAL LIGATION  VEIN LIGATION AND STRIPPING     x2   Social History   Tobacco Use   Smoking status: Former    Types: Cigarettes    Quit date: 04/24/1996    Years since quitting: 25.0   Smokeless tobacco: Never  Vaping Use   Vaping Use: Never used  Substance Use Topics   Alcohol use: Yes    Alcohol/week: 0.0 standard drinks    Comment: occ. (maybe every 3 mos.)   Drug use: No   Family History  Problem Relation Age of Onset   Transient ischemic attack Mother    Hypertension Mother    Hypertension Father    Colon cancer Neg Hx    Esophageal cancer Neg Hx    Rectal cancer Neg Hx    Stomach cancer Neg Hx    Allergies  Allergen Reactions   Cozaar [Losartan] Swelling   Amlodipine Besylate     REACTION: edema   Atorvastatin     REACTION: muscle pain   Neomycin-Bacitracin Zn-Polymyx     REACTION:  rash Per pt not sure if from meds or not   Current Outpatient Medications on File Prior to Visit  Medication Sig Dispense Refill   aspirin 81 MG tablet Take 81 mg by mouth daily.     calcium carbonate (TUMS EX) 750 MG chewable tablet Chew 2 tablets by mouth at bedtime.     Cholecalciferol (VITAMIN D) 1000 UNITS capsule Take 2,000 Units by mouth daily.      fluticasone (FLONASE) 50 MCG/ACT nasal spray USE 2 SPRAYS IN BOTH  NOSTRILS DAILY 48 g 0   hydrALAZINE (APRESOLINE) 25 MG tablet Take 1 tablet (25 mg total) by mouth every 8 (eight) hours. 90 tablet 0   levothyroxine (SYNTHROID) 50 MCG tablet TAKE 1 TABLET BY MOUTH  DAILY BEFORE BREAKFAST 90 tablet 1   pantoprazole (PROTONIX) 40 MG tablet TAKE 1 TABLET BY MOUTH  DAILY 90 tablet 1   sertraline (ZOLOFT) 100 MG tablet TAKE 1 TABLET BY MOUTH  DAILY 90 tablet 1   No current facility-administered medications on file prior to visit.    Review of Systems  Constitutional:  Positive for fatigue. Negative for activity change, appetite change, fever and unexpected weight change.       Generalized weakness  HENT:  Negative for congestion, ear pain, rhinorrhea, sinus pressure and sore throat.   Eyes:  Negative for pain, redness and visual disturbance.  Respiratory:  Negative for cough, shortness of breath and wheezing.   Cardiovascular:  Negative for chest pain and palpitations.  Gastrointestinal:  Positive for diarrhea. Negative for abdominal pain, blood in stool, constipation, nausea and vomiting.  Endocrine: Negative for polydipsia and polyuria.  Genitourinary:  Negative for dysuria, frequency and urgency.  Musculoskeletal:  Negative for arthralgias, back pain and myalgias.  Skin:  Negative for pallor and rash.  Allergic/Immunologic: Negative for environmental allergies.  Neurological:  Positive for light-headedness. Negative for syncope and headaches.  Hematological:  Negative for adenopathy. Does not bruise/bleed easily.   Psychiatric/Behavioral:  Negative for decreased concentration and dysphoric mood. The patient is not nervous/anxious.       Objective:   Physical Exam Constitutional:      General: She is not in acute distress.    Appearance: Normal appearance. She is well-developed. She is obese. She is not ill-appearing or diaphoretic.  HENT:     Head: Normocephalic and atraumatic.     Mouth/Throat:     Mouth: Mucous membranes are moist.  Eyes:  General: No scleral icterus.       Right eye: No discharge.        Left eye: No discharge.     Conjunctiva/sclera: Conjunctivae normal.     Pupils: Pupils are equal, round, and reactive to light.  Neck:     Thyroid: No thyromegaly.     Vascular: No carotid bruit or JVD.  Cardiovascular:     Rate and Rhythm: Normal rate and regular rhythm.     Heart sounds: Normal heart sounds.    No gallop.  Pulmonary:     Effort: Pulmonary effort is normal. No respiratory distress.     Breath sounds: Normal breath sounds. No stridor. No wheezing, rhonchi or rales.  Abdominal:     General: Bowel sounds are normal. There is no distension or abdominal bruit.     Palpations: Abdomen is soft. There is no mass.     Tenderness: There is no abdominal tenderness. There is no guarding or rebound.  Musculoskeletal:     Cervical back: Normal range of motion and neck supple. No tenderness.     Right lower leg: No edema.     Left lower leg: No edema.  Lymphadenopathy:     Cervical: No cervical adenopathy.  Skin:    General: Skin is warm and dry.     Coloration: Skin is not jaundiced or pale.     Findings: No bruising, erythema or rash.  Neurological:     Mental Status: She is alert.     Coordination: Coordination normal.     Deep Tendon Reflexes: Reflexes are normal and symmetric. Reflexes normal.     Comments: A little unsteady- husband helped with ambulation   Psychiatric:        Mood and Affect: Mood normal.     Comments: Voices fatigue but mentally sharp           Assessment & Plan:   Problem List Items Addressed This Visit       Cardiovascular and Mediastinum   Essential hypertension    bp in fair control at this time  BP Readings from Last 1 Encounters:  04/27/21 136/80  Off ARB and hctz due to angioedema  Now tolerating hydralazine so far 25 mg tid  Most recent labs reviewed  Disc lifstyle change with low sodium diet and exercise        Relevant Medications   EPINEPHrine 0.3 mg/0.3 mL IJ SOAJ injection     Endocrine   Hypothyroidism    Lab Results  Component Value Date   TSH 6.255 (H) 04/17/2021  On levothyroxine 50 mcg Recent hospitalization  Would like to re check when stabilized before re checking and/or changing dose        Other   Angioedema - Primary    Severe, requiring intubation in the hospital  Reviewed hospital records, lab results and studies in detail   ? If caused by ARB  tx with decadron and H1, H2 blockers  Breathing is improved/nl pulse ox and weaned off 02 and steroids , recovering with home care  Urgent referral made to allergist  Px epi pen to keep on hand for now        Relevant Orders   Ambulatory referral to Allergy   Diarrhea    Started 2 d after home from hospital  Need to r/o c diff Enc fluids c diff test ordered -pending result will tx if pos Will monitor for s/s of dehydration or abd pain or other symptoms  or hypotension        Relevant Orders   C. difficile GDH and Toxin A/B

## 2021-04-27 NOTE — Patient Instructions (Addendum)
Use your walker Start your therapy   Urgent allergist referral done to get you an appt.   Drink water - to prevent dehydration with the diarrhea  If you get more dizzy let us know  We will send stool for a c diff test  A probiotic may help or yogurt   Keep the epi pen on hand please

## 2021-04-27 NOTE — ED Provider Notes (Signed)
[facility] Provider Note    Event Date/Time   First MD Initiated Contact with Patient 04/27/21 2146     (approximate)   History   Allergic Reaction   HPI  Sharon [name] is a 71 y.o. female  who, per discharge summary dated  1.2.23 had recent admission for angioedema requiring intubation, presents to the emergency department today because of concerns for tongue swelling.  Patient states that it started this afternoon shortly after eating.  She notices swelling to the left side of her tongue.  She did initially feel like she was having some difficulty with swallowing although denied any difficulty with breathing.  At the time my exam she states it has not seemed to have gotten any worse.  She says it feels much better than it had during her admission.      Physical Exam   Triage Vital Signs: ED Triage Vitals  Enc Vitals Group     BP 04/27/21 2138 (!) 172/95     Pulse Rate 04/27/21 2138 (!) 103     Resp 04/27/21 2138 18     Temp 04/27/21 2138 98.2 F (36.8 C)     Temp Source 04/27/21 2138 Oral     SpO2 04/27/21 2138 97 %     Weight 04/27/21 2136 215 lb 6.2 oz (97.7 kg)     Height 04/27/21 2136 5\' 5"  (1.651 m)     Head Circumference --      Peak Flow --      Pain Score 04/27/21 2136 0     Pain Loc --      Pain Edu? --      Excl. in GC? --     Most recent vital signs: Vitals:   04/27/21 2138  BP: (!) 172/95  Pulse: (!) 103  Resp: 18  Temp: 98.2 F (36.8 C)  SpO2: 97%    General: Awake, no distress.  CV:  Good peripheral perfusion.  Resp:  Normal effort.  Abd:  No distention.  Mouth:  Swelling to the left side of the tongue. No other oropharyngeal swelling appreciated.      ED Results / Procedures / Treatments   Labs (all labs ordered are listed, but only abnormal results are displayed) Labs Reviewed - No data to display   EKG  None   RADIOLOGY None   PROCEDURES:  Critical Care performed:  No  Procedures   MEDICATIONS ORDERED IN ED: Medications - No data to display   IMPRESSION / MDM / ASSESSMENT AND PLAN / ED COURSE  I reviewed the triage vital signs and the nursing notes.                              Differential diagnosis includes, but is not limited to, angioedema, allergic reaction, cancer, infection.  Patient presented to the emergency department today because of concerns for left tongue swelling.  Patient did have recent admission to the hospital for angioedema requiring intubation.  The time my exam the patient only had swelling to the left side of her tongue.  No respiratory distress.  Patient was given steroids, Benadryl, Pepcid and an epi shot.  Tongue swelling did not worsen.   FINAL CLINICAL IMPRESSION(S) / ED DIAGNOSES   Left tongue swelling       Note:  This document was prepared using Dragon voice recognition software and may include unintentional dictation errors.    2139,  MD 04/28/21 1534

## 2021-04-27 NOTE — Assessment & Plan Note (Signed)
Severe, requiring intubation in the hospital  Reviewed hospital records, lab results and studies in detail   ? If caused by ARB  tx with decadron and H1, H2 blockers  Breathing is improved/nl pulse ox and weaned off 02 and steroids , recovering with home care  Urgent referral made to allergist  Px epi pen to keep on hand for now

## 2021-04-27 NOTE — Assessment & Plan Note (Signed)
Started 2 d after home from hospital  Need to r/o c diff Enc fluids c diff test ordered -pending result will tx if pos Will monitor for s/s of dehydration or abd pain or other symptoms or hypotension

## 2021-04-27 NOTE — Assessment & Plan Note (Signed)
Lab Results  Component Value Date   TSH 6.255 (H) 04/17/2021   On levothyroxine 50 mcg Recent hospitalization  Would like to re check when stabilized before re checking and/or changing dose

## 2021-04-27 NOTE — Telephone Encounter (Signed)
Calling pt for TCM call but pt was already being seen by PCP

## 2021-04-28 ENCOUNTER — Encounter: Payer: Self-pay | Admitting: Family Medicine

## 2021-04-28 ENCOUNTER — Telehealth: Payer: Self-pay

## 2021-04-28 ENCOUNTER — Encounter: Payer: Self-pay | Admitting: Internal Medicine

## 2021-04-28 DIAGNOSIS — T783XXD Angioneurotic edema, subsequent encounter: Secondary | ICD-10-CM

## 2021-04-28 DIAGNOSIS — K219 Gastro-esophageal reflux disease without esophagitis: Secondary | ICD-10-CM | POA: Diagnosis not present

## 2021-04-28 DIAGNOSIS — F411 Generalized anxiety disorder: Secondary | ICD-10-CM

## 2021-04-28 DIAGNOSIS — D649 Anemia, unspecified: Secondary | ICD-10-CM

## 2021-04-28 LAB — TYPE AND SCREEN
ABO/RH(D): A POS
Antibody Screen: NEGATIVE

## 2021-04-28 LAB — BASIC METABOLIC PANEL
Anion gap: 10 (ref 5–15)
BUN: 14 mg/dL (ref 8–23)
CO2: 24 mmol/L (ref 22–32)
Calcium: 9.3 mg/dL (ref 8.9–10.3)
Chloride: 105 mmol/L (ref 98–111)
Creatinine, Ser: 0.76 mg/dL (ref 0.44–1.00)
GFR, Estimated: >60 mL/min (ref >60)
Glucose, Bld: 139 mg/dL — ABNORMAL HIGH (ref 70–99)
Potassium: 4.2 mmol/L (ref 3.5–5.1)
Sodium: 139 mmol/L (ref 135–145)

## 2021-04-28 LAB — CBC
HCT: 34.8 % — ABNORMAL LOW (ref 36.0–46.0)
Hemoglobin: 11.2 g/dL — ABNORMAL LOW (ref 12.0–15.0)
MCH: 31.2 pg (ref 26.0–34.0)
MCHC: 32.2 g/dL (ref 30.0–36.0)
MCV: 96.9 fL (ref 80.0–100.0)
Platelets: 213 10*3/uL (ref 150–400)
RBC: 3.59 MIL/uL — ABNORMAL LOW (ref 3.87–5.11)
RDW: 13.5 % (ref 11.5–15.5)
WBC: 10.4 10*3/uL (ref 4.0–10.5)
nRBC: 0 % (ref 0.0–0.2)

## 2021-04-28 LAB — RETICULOCYTES
Immature Retic Fract: 14.4 % (ref 2.3–15.9)
RBC.: 3.5 MIL/uL — ABNORMAL LOW (ref 3.87–5.11)
Retic Count, Absolute: 89.6 10*3/uL (ref 19.0–186.0)
Retic Ct Pct: 2.6 % (ref 0.4–3.1)

## 2021-04-28 LAB — IRON AND TIBC
Iron: 58 ug/dL (ref 28–170)
Saturation Ratios: 17 % (ref 10.4–31.8)
TIBC: 356 ug/dL (ref 250–450)
UIBC: 295 ug/dL

## 2021-04-28 LAB — FERRITIN: Ferritin: 214 ng/mL (ref 11–307)

## 2021-04-28 LAB — VITAMIN B12: Vitamin B-12: 392 pg/mL (ref 180–914)

## 2021-04-28 LAB — RESP PANEL BY RT-PCR (FLU A&B, COVID) ARPGX2
Influenza A by PCR: NEGATIVE
Influenza B by PCR: NEGATIVE
SARS Coronavirus 2 by RT PCR: NEGATIVE

## 2021-04-28 LAB — FOLATE: Folate: 14.1 ng/mL (ref >5.9)

## 2021-04-28 MED ORDER — DIPHENHYDRAMINE HCL 50 MG/ML IJ SOLN
25.0000 mg | Freq: Two times a day (BID) | INTRAMUSCULAR | Status: DC
  Administered 2021-04-28 (×2): 25 mg via INTRAVENOUS
  Filled 2021-04-28 (×2): qty 1

## 2021-04-28 MED ORDER — FAMOTIDINE 20 MG PO TABS: 20.0000 mg | ORAL_TABLET | Freq: Two times a day (BID) | ORAL | 0 refills | Status: DC

## 2021-04-28 MED ORDER — HEPARIN SODIUM (PORCINE) 5000 UNIT/ML IJ SOLN
5000.0000 [IU] | Freq: Three times a day (TID) | INTRAMUSCULAR | Status: DC
  Administered 2021-04-28: 5000 [IU] via SUBCUTANEOUS
  Filled 2021-04-28: qty 1

## 2021-04-28 MED ORDER — ACETAMINOPHEN 325 MG PO TABS: 650.0000 mg | ORAL_TABLET | Freq: Four times a day (QID) | ORAL | Status: DC | PRN

## 2021-04-28 MED ORDER — PANTOPRAZOLE SODIUM 40 MG PO TBEC
40.0000 mg | DELAYED_RELEASE_TABLET | Freq: Every day | ORAL | Status: DC
Start: 2021-04-28 — End: 2021-04-28

## 2021-04-28 MED ORDER — METHYLPREDNISOLONE SODIUM SUCC 125 MG IJ SOLR
80.0000 mg | Freq: Two times a day (BID) | INTRAMUSCULAR | Status: DC
  Administered 2021-04-28: 80 mg via INTRAVENOUS
  Filled 2021-04-28: qty 2

## 2021-04-28 MED ORDER — PANTOPRAZOLE SODIUM 40 MG IV SOLR
40.0000 mg | Freq: Two times a day (BID) | INTRAVENOUS | Status: DC
  Administered 2021-04-28 (×2): 40 mg via INTRAVENOUS
  Filled 2021-04-28 (×2): qty 40

## 2021-04-28 MED ORDER — FLUTICASONE PROPIONATE 50 MCG/ACT NA SUSP
1.0000 | Freq: Every day | NASAL | Status: DC | PRN
  Filled 2021-04-28: qty 16

## 2021-04-28 MED ORDER — ACETAMINOPHEN 325 MG RE SUPP: 650.0000 mg | Freq: Four times a day (QID) | RECTAL | Status: DC | PRN

## 2021-04-28 MED ORDER — HYDRALAZINE HCL 50 MG PO TABS
25.0000 mg | ORAL_TABLET | Freq: Three times a day (TID) | ORAL | Status: DC
  Administered 2021-04-28: 25 mg via ORAL
  Filled 2021-04-28: qty 1

## 2021-04-28 MED ORDER — PREDNISONE 20 MG PO TABS
20.0000 mg | ORAL_TABLET | Freq: Every day | ORAL | 0 refills | Status: AC
Start: 2021-04-28 — End: 2021-05-03

## 2021-04-28 MED ORDER — SERTRALINE HCL 50 MG PO TABS
100.0000 mg | ORAL_TABLET | Freq: Every day | ORAL | Status: DC
Start: 2021-04-28 — End: 2021-04-28
  Administered 2021-04-28: 100 mg via ORAL
  Filled 2021-04-28: qty 2

## 2021-04-28 MED ORDER — FAMOTIDINE IN NACL 20-0.9 MG/50ML-% IV SOLN
20.0000 mg | Freq: Two times a day (BID) | INTRAVENOUS | Status: DC
  Administered 2021-04-28 (×2): 20 mg via INTRAVENOUS
  Filled 2021-04-28 (×2): qty 50

## 2021-04-28 MED ORDER — LEVOTHYROXINE SODIUM 50 MCG PO TABS
50.0000 ug | ORAL_TABLET | Freq: Every day | ORAL | Status: DC
  Administered 2021-04-28: 50 ug via ORAL
  Filled 2021-04-28: qty 1

## 2021-04-28 NOTE — H&P (Signed)
History and Physical    Sharon [name]:[id number] DOB: 1950/08/07 DOA: 04/27/2021  PCP: Sharon Pimple, MD    Patient coming from:  Home   Chief Complaint:  Angioedema   HPI:  Sharon [name] is a 71 y.o. female seen in ed with complaints of left-sided tongue swelling that started today at about 8 PM patient on arrival and received Solu-Medrol Benadryl Pepcid and EpiPen here.  Since then her swelling has improved and patient has no hoarseness drooling difficulty speaking or breathing or swallowing per ED provider patient swelling is now improved on its on the left side.  O2 sats are 95% on room air.  Patient denies any headaches blurred vision dizziness chest pain shortness of breath palpitations fevers chills rashes abdominal pain bowel movement changes or urinary issues.  Patient was also recently hospitalized from 12-25 to 04-25-2021 for angioedema that started after having dinner patient was hospitalized and treated with H1 H2 blockers steroids and antihistamines. She was intubated this admission, Initially patient was angioedema was attributed to possibly her ARB use.  Pt has past medical history of angioedema, hypertension, allergies to Cozaar, amlodipine, atorvastatin, neomycin bacitracin zinc polymyxin ointment, acute respiratory failure with airway compromise secondary to angioedema, hypothyroidism, hyperlipidemia, anxiety.  ED Course:   Vitals:   04/27/21 2136 04/27/21 2138 04/28/21 0001  BP:  (!) 172/95 129/67  Pulse:  (!) 103 92  Resp:  18 18  Temp:  98.2 F (36.8 C) 98.6 F (37 C)  TempSrc:  Oral Oral  SpO2:  97% 96%  Weight: 97.7 kg    Height: 5\' 5"  (1.651 m)    In the emergency room patient is afebrile oxygenating 97% on room air. Blood work today shows glucose of 102 normal creatinine, AST ALT of 54 and 60, CBC shows hemoglobin of 11.4 which is same as 1 week ago, white count of 10.5 and platelets of 215,Urinalysis negative except for 30 of protein and a  specific gravity of 1.030.  Review of Systems:  Review of Systems  HENT:  Positive for congestion.   All other systems reviewed and are negative.   Past Medical History:  Diagnosis Date   Anxiety    Cataract    GERD (gastroesophageal reflux disease)    Hyperlipidemia    Hypertension    Hypothyroidism    Vitamin D deficiency     Past Surgical History:  Procedure Laterality Date   CATARACT EXTRACTION W/ INTRAOCULAR LENS IMPLANT Right 12/29/2016   Dr. 02/28/2017   CATARACT EXTRACTION W/ INTRAOCULAR LENS IMPLANT Left 01/15/2017   Dr. 01/17/2017   COLONOSCOPY     LEEP     TUBAL LIGATION     VEIN LIGATION AND STRIPPING     x2     reports that she quit smoking about 25 years ago. Her smoking use included cigarettes. She has never used smokeless tobacco. She reports current alcohol use. She reports that she does not use drugs.  Allergies  Allergen Reactions   Cozaar [Losartan] Swelling   Amlodipine Besylate     REACTION: edema   Atorvastatin     REACTION: muscle pain   Neomycin-Bacitracin Zn-Polymyx     REACTION: rash Per pt not sure if from meds or not    Family History  Problem Relation Age of Onset   Transient ischemic attack Mother    Hypertension Mother    Hypertension Father    Colon cancer Neg Hx    Esophageal cancer Neg Hx  Rectal cancer Neg Hx    Stomach cancer Neg Hx     Prior to Admission medications   Medication Sig Start Date End Date Taking? Authorizing Provider  albuterol (PROAIR HFA) 108 (90 Base) MCG/ACT inhaler TAKE 2 PUFFS BY MOUTH EVERY 6 HOURS AS NEEDED FOR WHEEZE OR SHORTNESS OF BREATH 04/27/21   Tower, Audrie GallusMarne A, MD  aspirin 81 MG tablet Take 81 mg by mouth daily.    [provider]  calcium carbonate (TUMS EX) 750 MG chewable tablet Chew 2 tablets by mouth at bedtime.    [provider]  Cholecalciferol (VITAMIN D) 1000 UNITS capsule Take 2,000 Units by mouth daily.     [provider]  EPINEPHrine 0.3  mg/0.3 mL IJ SOAJ injection Inject 0.3 mg into the muscle as needed for anaphylaxis. 04/27/21   Tower, Audrie GallusMarne A, MD  fluticasone (FLONASE) 50 MCG/ACT nasal spray USE 2 SPRAYS IN BOTH  NOSTRILS DAILY 10/19/20   Tower, Audrie GallusMarne A, MD  hydrALAZINE (APRESOLINE) 25 MG tablet Take 1 tablet (25 mg total) by mouth every 8 (eight) hours. 04/25/21 05/25/21  Jerald Kiefhiu, Stephen K, MD  levothyroxine (SYNTHROID) 50 MCG tablet TAKE 1 TABLET BY MOUTH  DAILY BEFORE BREAKFAST 01/10/21   Tower, Audrie GallusMarne A, MD  pantoprazole (PROTONIX) 40 MG tablet TAKE 1 TABLET BY MOUTH  DAILY 01/10/21   Tower, Audrie GallusMarne A, MD  sertraline (ZOLOFT) 100 MG tablet TAKE 1 TABLET BY MOUTH  DAILY 01/10/21   Sharon Pimpleower, Marne A, MD    Physical Exam: Vitals:   04/27/21 2136 04/27/21 2138 04/28/21 0001  BP:  (!) 172/95 129/67  Pulse:  (!) 103 92  Resp:  18 18  Temp:  98.2 F (36.8 C) 98.6 F (37 C)  TempSrc:  Oral Oral  SpO2:  97% 96%  Weight: 97.7 kg    Height: 5\' 5"  (1.651 m)     Physical Exam Vitals and nursing note reviewed.  Constitutional:      Appearance: She is obese. She is not ill-appearing.  HENT:     Head: Normocephalic and atraumatic.     Right Ear: External ear normal.     Left Ear: External ear normal.     Nose: Nose normal.     Mouth/Throat:     Tongue: No lesions. Tongue does not deviate from midline.     Pharynx: Oropharynx is clear. Uvula midline. No pharyngeal swelling or posterior oropharyngeal erythema.     Tonsils: No tonsillar exudate or tonsillar abscesses.      Comments: Left-sided swelling of the tongue. Eyes:     Extraocular Movements: Extraocular movements intact.     Pupils: Pupils are equal, round, and reactive to light.  Cardiovascular:     Rate and Rhythm: Normal rate and regular rhythm.     Pulses: Normal pulses.     Heart sounds: Normal heart sounds.  Pulmonary:     Effort: Pulmonary effort is normal.     Breath sounds: Normal breath sounds.  Abdominal:     General: Bowel sounds are normal. There is no  distension.     Palpations: Abdomen is soft. There is no mass.     Tenderness: There is no abdominal tenderness. There is no guarding.     Hernia: No hernia is present.  Musculoskeletal:     Right lower leg: No edema.     Left lower leg: No edema.  Skin:    General: Skin is warm.  Neurological:     General: No focal  deficit present.     Mental Status: She is alert and oriented to person, place, and time.  Psychiatric:        Mood and Affect: Mood normal.        Behavior: Behavior normal.     Labs on Admission: I have personally reviewed following labs and imaging studies  No results for input(s): CKTOTAL, CKMB, TROPONINI in the last 72 hours. Lab Results  Component Value Date   WBC 10.5 04/23/2021   HGB 11.4 (L) 04/23/2021   HCT 33.8 (L) 04/23/2021   MCV 94.7 04/23/2021   PLT 215 04/23/2021    Recent Labs  Lab 04/23/21 0524 04/24/21 0707  NA 136  --   K 4.0  --   CL 102  --   CO2 25  --   BUN 17  --   CREATININE 0.56 0.67  CALCIUM 9.0  --   PROT 6.5  --   BILITOT 1.2  --   ALKPHOS 94  --   ALT 60*  --   AST 54*  --   GLUCOSE 102*  --    Lab Results  Component Value Date   CHOL 229 (H) 12/09/2019   HDL 58.80 12/09/2019   LDLCALC 103 (H) 11/03/2013   TRIG 223 (H) 04/21/2021   No results found for: DDIMER Invalid input(s): POCBNP   COVID-19 Labs No results for input(s): DDIMER, FERRITIN, LDH, CRP in the last 72 hours. Lab Results  Component Value Date   SARSCOV2NAA NEGATIVE 04/17/2021    Radiological Exams on Admission: No results found.  EKG: Independently reviewed.  Sinus tach 100, normal EKG otherwise.   Assessment/Plan: Principal Problem:   Angioedema Active Problems:   Hypothyroidism   Anxiety state   Essential hypertension   GERD   Anemia Angioedema: With patient's recent discharge for right-sided angioedema and extensive hospitalist today and ends intubation we will monitor patient in the hospital. Continuous pulse oximetry to  assess if patient becomes hypoxic or has any airway compromise.  Patient is continued on H1 and H2 blockers and steroids overnight.  We will obtain a C4 level. Patient has an appointment with the allergist this Friday at 8 AM. Encourage patient to start taking an antihistamine like Zyrtec or Xyzal on a regular basis and establish with an allergist.  Hypothyroidism: We will continue patient on her home regimen with 50 mcg of levothyroxine by mouth.  Anxiety: Patient continued on her Zoloft.  Hypertension: Blood pressure 129/67, pulse 92, temperature 98.6 F (37 C), temperature source Oral, resp. rate 18, height 5\' 5"  (1.651 m), weight 97.7 kg, SpO2 96 %. Discussed with the patient to discuss with primary care physician about her allergies amlodipine and lower extremity edema is not a true allergy admit eliminated good medication regimen that she may need later. Patient continued on hydralazine.  GERD: Patient continued on Pepcid.  Anemia: Persistent anemia since December 2022 where patient's hemoglobin has been 11.9, 11.6, 11.4, 9.7, 10.6 and now 11.4. Will defer to primary care physician for anemia evaluation we will however obtain stool guaiac start patient on IV PPI, type and screen.  DVT prophylaxis:  Heparin  Code Status:  Full code  Family Communication:  Tully,James (Spouse)  [phone] (Mobile)   Disposition Plan:  Home  Consults called:  None  Admission status: Observation   Gertha CalkinEkta V Avaree Gilberti MD [facility]  6 PM- 2 AM. Please contact me via secure Chat 6 PM-2 AM. How to contact the [facility] Attending or Consulting  provider 7A - 7P or covering provider during after hours 7P -7A, for this patient.   Check the care team in [facility] and look for a) attending/consulting [facility] provider listed and b) the [facility] team listed Log into [email or web address] and use [place]'s universal password to access. If you do not have the password, please contact the hospital operator. Locate  the [facility] provider you are looking for under [facility] and page to a number that you can be directly reached. If you still have difficulty reaching the provider, please page the [facility] (Director on Call) for the Hospitalists listed on amion for assistance. [email or web address] 04/28/2021, 1:57 AM

## 2021-04-28 NOTE — Telephone Encounter (Signed)
Aware she is in the hospital, will follow in epic

## 2021-04-28 NOTE — ED Notes (Signed)
Pt given breakfast tray and coffee, pt tolerating well. Pt denies feeling swelling in mouth. Family at bedside

## 2021-04-28 NOTE — Telephone Encounter (Signed)
[facility] Night - Client Nonclinical Telephone Record  [facility] Client [facility] Night - Client Client Site [facility] - Night Provider Roxy Manns - MD Contact Type Call Call Type Home Care Hospice Page Now Who Is Calling Home Health / Hospice Agency Caller Name [facility] Number [phone] Patient Name Sharon [name] Patient DOB December 23, 1950 Reason for Call Request to speak to Physician Initial Comment Caller states she is with home health. States she is needing orders to move an appointment for occupational therapy. Disp. Time Disposition Final User 04/27/2021 5:13:44 PM Send to [facility] Paging Queue Nevin Bloodgood 04/27/2021 5:17:19 PM Called On-Call Provider Macario Carls 04/27/2021 5:17:50 PM Page Completed Yes Claybon Jabs [facility] Phone DateTime Result/Outcome Message Type Notes Elizabeth Palau- MD [phone] 04/27/2021 5:17:19 PM Called On Call Provider - Reached Doctor Paged Elizabeth Palau- MD 04/27/2021 5:17:43 PM Spoke with On Call - General Message Result Spoke with On Call, information provided and warm conference the call. Call Closed By: Macario Carls Transaction Date/Time: 04/27/2021 5:10:27 PM (ET

## 2021-04-28 NOTE — ED Notes (Addendum)
Pt vs taken. Pt repositioned in bed.

## 2021-04-28 NOTE — Discharge Summary (Signed)
Physician Discharge Summary  Sharon [name] EAV:[id number] DOB: [birth year] DOA: 04/27/2021  PCP: Judy Pimpleower, Marne A, MD  Admit date: 04/27/2021 Discharge date: 04/28/2021  Admitted From: home Disposition:  home  Recommendations for Outpatient Follow-up:  Follow up with PCP in 1weeks With allergy specialist tomorrow  Home Health:no  Equipment/Devices: none  Discharge Condition: Stable Code Status:   Code Status: Full Code Diet recommendation:  Diet Order             DIET DYS 3 Room service appropriate? Yes; Fluid consistency: Thin  Diet effective now                 Brief/Interim Summary:  71 y.o. female with history of hypertension anxiety disorder hypothyroidism GERD who was recently admitted from 12/25-04/25/21 for angioedema needing intubation presumed secondary to ARB, and was subsequently discharged home and has allergy specialist follow-up tomorrow.  Presents with left tongue swelling since 8 PM 1/4, was seen by her PCP earlier in the day.  She was seen in the ED and admitted for observation overnight malady steroids Pepcid Benadryl. Seen this morning she has clinically improved requesting to go home husband at the bedside reports he forgot that he had EpiPen at home reports they will see the allergy doctor in the morning and wanted to go home this morning. Alert and oriented x3 tolerated diet well minimal swelling on the left tongues Stable for discharge home  Discharge Diagnoses:  Angioedema: Resolved.  Continue prednisone Pepcid short course has a follow-up with her doctor tomorrow, do not start new medications and advised to discuss with herAllergy specialist in am including new med hydralazine Hypothyroidism: "Continue Synthroid Anxiety state: Stable continue home Essential hypertension: Antihypertensives are held on last discharge including ACE inhibitor HCTZ and placed on hydralazine which she is tolerating. GERD: Continue PPI Anemia:  Stable.   Consults: none  Subjective: Alert and oriented x3, ambulated on room air no complaints nose mild swelling this morning, tolerated diet.  Requesting to go home this morning  Discharge Exam: Vitals:   04/28/21 0756 04/28/21 0940  BP: (!) 151/76 (!) 155/84  Pulse: 80   Resp: 16   Temp:    SpO2: 96%    General: Pt is alert, awake, not in acute distress Cardiovascular: RRR, S1/S2 +, no rubs, no gallops Respiratory: CTA bilaterally, no wheezing, no rhonchi Abdominal: Soft, NT, ND, bowel sounds + Extremities: no edema, no cyanosis  Discharge Instructions  Discharge Instructions     Discharge instructions   Complete by: As directed    Follow-up with allergy doctor tomorrow to schedule.  If you have any swelling of the mouth lips tongue eyes difficulty breathing cough please contact your MD or return to the ED  Please call call MD or return to ER for similar or worsening recurring problem that brought you to hospital or if any fever,nausea/vomiting,abdominal pain, uncontrolled pain, chest pain,  shortness of breath or any other alarming symptoms.  Please follow-up your doctor as instructed in a week time and call the office for appointment.  Please avoid alcohol, smoking, or any other illicit substance and maintain healthy habits including taking your regular medications as prescribed.  You were cared for by a hospitalist during your hospital stay. If you have any questions about your discharge medications or the care you received while you were in the hospital after you are discharged, you can call the unit and ask to speak with the hospitalist on call if the hospitalist that took care  of you is not available.  Once you are discharged, your primary care physician will handle any further medical issues. Please note that NO REFILLS for any discharge medications will be authorized once you are discharged, as it is imperative that you return to your primary care physician (or  establish a relationship with a primary care physician if you do not have one) for your aftercare needs so that they can reassess your need for medications and monitor your lab values   Increase activity slowly   Complete by: As directed       Allergies as of 04/28/2021       Reactions   Cozaar [losartan] Swelling   Amlodipine Besylate    REACTION: edema   Atorvastatin    REACTION: muscle pain   Neomycin-bacitracin Zn-polymyx    REACTION: rash Per pt not sure if from meds or not        Medication List     TAKE these medications    albuterol 108 (90 Base) MCG/ACT inhaler Commonly known as: ProAir HFA TAKE 2 PUFFS BY MOUTH EVERY 6 HOURS AS NEEDED FOR WHEEZE OR SHORTNESS OF BREATH   aspirin 81 MG tablet Take 81 mg by mouth daily.   calcium carbonate 750 MG chewable tablet Commonly known as: TUMS EX Chew 2 tablets by mouth at bedtime.   EPINEPHrine 0.3 mg/0.3 mL Soaj injection Commonly known as: EPI-PEN Inject 0.3 mg into the muscle as needed for anaphylaxis.   famotidine 20 MG tablet Commonly known as: PEPCID Take 1 tablet (20 mg total) by mouth 2 (two) times daily for 5 days.   fluticasone 50 MCG/ACT nasal spray Commonly known as: FLONASE USE 2 SPRAYS IN BOTH  NOSTRILS DAILY   hydrALAZINE 25 MG tablet Commonly known as: APRESOLINE Take 1 tablet (25 mg total) by mouth every 8 (eight) hours.   levothyroxine 50 MCG tablet Commonly known as: SYNTHROID TAKE 1 TABLET BY MOUTH  DAILY BEFORE BREAKFAST   pantoprazole 40 MG tablet Commonly known as: PROTONIX TAKE 1 TABLET BY MOUTH  DAILY   predniSONE 20 MG tablet Commonly known as: DELTASONE Take 1 tablet (20 mg total) by mouth daily for 5 days.   sertraline 100 MG tablet Commonly known as: ZOLOFT TAKE 1 TABLET BY MOUTH  DAILY   Vitamin D 1000 units capsule Take 2,000 Units by mouth daily.        Follow-up Information     Tower, Audrie Gallus, MD Follow up in 1 week(s).   Specialties: Family Medicine,  Radiology Contact information: [address] [place] [address] [phone]         immunologist Follow up on 04/29/2021.   Why: as scheduled               Allergies  Allergen Reactions   Cozaar [Losartan] Swelling   Amlodipine Besylate     REACTION: edema   Atorvastatin     REACTION: muscle pain   Neomycin-Bacitracin Zn-Polymyx     REACTION: rash Per pt not sure if from meds or not    The results of significant diagnostics from this hospitalization (including imaging, microbiology, ancillary and laboratory) are listed below for reference.    Microbiology: Recent Results (from the past 240 hour(s))  Culture, Respiratory w Gram Stain     Status: None   Collection Time: 04/20/21  8:46 AM   Specimen: Tracheal Aspirate; Respiratory  Result Value Ref Range Status   Specimen Description   Final  TRACHEAL ASPIRATE Performed at [facility], [address], [place], [place] [address]    Special Requests   Final    NONE Performed at [facility], [address] [place], [place] [address]    Gram Stain   Final    ABUNDANT WBC PRESENT,BOTH PMN AND MONONUCLEAR ABUNDANT GRAM POSITIVE COCCI IN PAIRS IN CLUSTERS IN CHAINS MODERATE GRAM NEGATIVE RODS    Culture   Final    Normal respiratory flora-no Staph aureus or Pseudomonas seen Performed at [facility], [address] [place], [place] [address]    Report Status 04/22/2021 FINAL  Final  Resp Panel by RT-PCR (Flu A&B, Covid) Nasopharyngeal Swab     Status: None   Collection Time: 04/28/21  3:57 AM   Specimen: Nasopharyngeal Swab; Nasopharyngeal(NP) swabs in vial transport medium  Result Value Ref Range Status   SARS Coronavirus 2 by RT PCR NEGATIVE NEGATIVE Final    Comment: (NOTE) SARS-CoV-2 target nucleic acids are NOT DETECTED.  The SARS-CoV-2 RNA is generally detectable in upper respiratory specimens during the acute phase of infection. The lowest concentration of  SARS-CoV-2 viral copies this assay can detect is 138 copies/mL. A negative result does not preclude SARS-Cov-2 infection and should not be used as the sole basis for treatment or other patient management decisions. A negative result may occur with  improper specimen collection/handling, submission of specimen other than nasopharyngeal swab, presence of viral mutation(s) within the areas targeted by this assay, and inadequate number of viral copies(<138 copies/mL). A negative result must be combined with clinical observations, patient history, and epidemiological information. The expected result is Negative.  Fact Sheet for Patients:  BloggerCourse.com  Fact Sheet for Healthcare Providers:  SeriousBroker.it  This test is no t yet approved or cleared by the [place] FDA and  has been authorized for detection and/or diagnosis of SARS-CoV-2 by FDA under an Emergency Use Authorization (EUA). This EUA will remain  in effect (meaning this test can be used) for the duration of the COVID-19 declaration under Section 564(b)(1) of the Act, 21 U.S.C.section 360bbb-3(b)(1), unless the authorization is terminated  or revoked sooner.       Influenza A by PCR NEGATIVE NEGATIVE Final   Influenza B by PCR NEGATIVE NEGATIVE Final    Comment: (NOTE) The Xpert Xpress SARS-CoV-2/FLU/RSV plus assay is intended as an aid in the diagnosis of influenza from Nasopharyngeal swab specimens and should not be used as a sole basis for treatment. Nasal washings and aspirates are unacceptable for Xpert Xpress SARS-CoV-2/FLU/RSV testing.  Fact Sheet for Patients: BloggerCourse.com  Fact Sheet for Healthcare Providers: SeriousBroker.it  This test is not yet approved or cleared by the [place] FDA and has been authorized for detection and/or diagnosis of SARS-CoV-2 by FDA under an Emergency Use  Authorization (EUA). This EUA will remain in effect (meaning this test can be used) for the duration of the COVID-19 declaration under Section 564(b)(1) of the Act, 21 U.S.C. section 360bbb-3(b)(1), unless the authorization is terminated or revoked.  Performed at [facility], [address] [place], [place] [address]     Procedures/Studies: [facility] Chest Port 1 View  Result Date: 04/20/2021 CLINICAL DATA:  Hypoxia, respiratory failure EXAM: PORTABLE CHEST 1 VIEW COMPARISON:  04/19/2021 FINDINGS: Single frontal view of the chest demonstrates stable endotracheal tube and enteric catheter. Cardiac silhouette is enlarged. Continued low lung volumes, with stable bibasilar consolidation left greater than right. No effusion or pneumothorax. IMPRESSION: 1. Stable support  devices. 2. Low lung volumes, with stable bibasilar consolidation consistent with atelectasis or airspace disease. Electronically Signed   By: Sharlet Salina M.D.   On: 04/20/2021 02:30   DG Chest Port 1 View  Result Date: 04/19/2021 CLINICAL DATA:  Leukocytosis. EXAM: PORTABLE CHEST 1 VIEW COMPARISON:  04/18/2021 FINDINGS: An endotracheal tube remains in place terminating 3 cm above the carina. An enteric tube courses into the stomach. The cardiomediastinal silhouette is unchanged. Lung volumes are slightly lower than on the prior study with increased, patchy opacity in the lung bases. No sizable pleural effusion or pneumothorax is identified. IMPRESSION: Low lung volumes with increased bibasilar opacities, atelectasis versus infection. Electronically Signed   By: Sebastian Ache M.D.   On: 04/19/2021 10:38   DG Chest Port 1 View  Result Date: 04/18/2021 CLINICAL DATA:  70 year old female with respiratory failure presenting with angioedema and stridor. Intubated. EXAM: PORTABLE CHEST 1 VIEW COMPARISON:  Portable chest 04/17/2021. FINDINGS: Portable AP semi upright view at 0511 hours. Endotracheal tube tip remains in good  position at the level the clavicles. Enteric tube courses to the abdomen with side hole at the level of the gastric body. Slightly lower lung volumes. Mediastinal contours remain within normal limits. Allowing for portable technique the lungs are clear. No pneumothorax or pleural effusion. No acute osseous abnormality identified. Paucity of bowel gas. IMPRESSION: 1.  Stable lines and tubes. 2. Lower lung volumes with no acute cardiopulmonary abnormality. Electronically Signed   By: Odessa Fleming M.D.   On: 04/18/2021 05:35   DG Chest Portable 1 View  Result Date: 04/17/2021 CLINICAL DATA:  Post intubation EXAM: PORTABLE CHEST 1 VIEW COMPARISON:  None. FINDINGS: Endotracheal tube tip about 2.8 cm superior to the carina. Esophageal tube tip and side port overlie the mid stomach. Patchy perihilar opacity without pleural effusion or pneumothorax. Normal cardiac size. Aortic atherosclerosis IMPRESSION: 1. Endotracheal tube tip 2.8 cm superior to carina 2. Esophageal tube tip and side port overlie the mid stomach 3. Patchy perihilar opacity, possibly due to respiratory infection Electronically Signed   By: Jasmine Pang M.D.   On: 04/17/2021 18:34    Labs: BNP (last 3 results) Recent Labs    04/17/21 1933  BNP 42.0   Basic Metabolic Panel: Recent Labs  Lab 04/22/21 0454 04/23/21 0524 04/24/21 0707 04/28/21 0736  NA 136 136  --  139  K 3.1* 4.0  --  4.2  CL 103 102  --  105  CO2 27 25  --  24  GLUCOSE 101* 102*  --  139*  BUN 23 17  --  14  CREATININE 0.70 0.56 0.67 0.76  CALCIUM 8.9 9.0  --  9.3  MG 2.2  --   --   --   PHOS 2.0*  --   --   --    Liver Function Tests: Recent Labs  Lab 04/23/21 0524  AST 54*  ALT 60*  ALKPHOS 94  BILITOT 1.2  PROT 6.5  ALBUMIN 3.2*   No results for input(s): LIPASE, AMYLASE in the last 168 hours. No results for input(s): AMMONIA in the last 168 hours. CBC: Recent Labs  Lab 04/22/21 0454 04/23/21 0524 04/28/21 0736  WBC 11.6* 10.5 10.4  HGB 11.4*  11.4* 11.2*  HCT 34.1* 33.8* 34.8*  MCV 97.4 94.7 96.9  PLT 226 215 213   Cardiac Enzymes: No results for input(s): CKTOTAL, CKMB, CKMBINDEX, TROPONINI in the last 168 hours. BNP: Invalid input(s): POCBNP CBG: Recent Labs  Lab 04/21/21 1124 04/21/21 1634 04/21/21 2013  GLUCAP 129* 126* 117*   D-Dimer No results for input(s): DDIMER in the last 72 hours. Hgb A1c No results for input(s): HGBA1C in the last 72 hours. Lipid Profile No results for input(s): CHOL, HDL, LDLCALC, TRIG, CHOLHDL, LDLDIRECT in the last 72 hours. Thyroid function studies No results for input(s): TSH, T4TOTAL, T3FREE, THYROIDAB in the last 72 hours.  Invalid input(s): FREET3 Anemia work up Recent Labs    04/28/21 0736  FOLATE 14.1  FERRITIN 214  TIBC 356  IRON 58  RETICCTPCT 2.6   Urinalysis    Component Value Date/Time   COLORURINE YELLOW 04/17/2021 1933   APPEARANCEUR CLEAR 04/17/2021 1933   LABSPEC >1.030 (H) 04/17/2021 1933   PHURINE 6.0 04/17/2021 1933   GLUCOSEU NEGATIVE 04/17/2021 1933   HGBUR NEGATIVE 04/17/2021 1933   HGBUR large 01/31/2007 1019   BILIRUBINUR NEGATIVE 04/17/2021 1933   KETONESUR NEGATIVE 04/17/2021 1933   PROTEINUR 30 (A) 04/17/2021 1933   UROBILINOGEN 0.2 01/31/2007 1019   NITRITE NEGATIVE 04/17/2021 1933   LEUKOCYTESUR NEGATIVE 04/17/2021 1933   Sepsis Labs Invalid input(s): PROCALCITONIN,  WBC,  LACTICIDVEN Microbiology Recent Results (from the past 240 hour(s))  Culture, Respiratory w Gram Stain     Status: None   Collection Time: 04/20/21  8:46 AM   Specimen: Tracheal Aspirate; Respiratory  Result Value Ref Range Status   Specimen Description   Final    TRACHEAL ASPIRATE Performed at [facility], [address] [place], [place] [address]    Special Requests   Final    NONE Performed at [facility], [address] [place], [place] [address]    Gram Stain   Final    ABUNDANT WBC PRESENT,BOTH PMN AND MONONUCLEAR ABUNDANT  GRAM POSITIVE COCCI IN PAIRS IN CLUSTERS IN CHAINS MODERATE GRAM NEGATIVE RODS    Culture   Final    Normal respiratory flora-no Staph aureus or Pseudomonas seen Performed at [facility], [address], [place], [place] [address]    Report Status 04/22/2021 FINAL  Final  Resp Panel by RT-PCR (Flu A&B, Covid) Nasopharyngeal Swab     Status: None   Collection Time: 04/28/21  3:57 AM   Specimen: Nasopharyngeal Swab; Nasopharyngeal(NP) swabs in vial transport medium  Result Value Ref Range Status   SARS Coronavirus 2 by RT PCR NEGATIVE NEGATIVE Final    Comment: (NOTE) SARS-CoV-2 target nucleic acids are NOT DETECTED.  The SARS-CoV-2 RNA is generally detectable in upper respiratory specimens during the acute phase of infection. The lowest concentration of SARS-CoV-2 viral copies this assay can detect is 138 copies/mL. A negative result does not preclude SARS-Cov-2 infection and should not be used as the sole basis for treatment or other patient management decisions. A negative result may occur with  improper specimen collection/handling, submission of specimen other than nasopharyngeal swab, presence of viral mutation(s) within the areas targeted by this assay, and inadequate number of viral copies(<138 copies/mL). A negative result must be combined with clinical observations, patient history, and epidemiological information. The expected result is Negative.  Fact Sheet for Patients:  BloggerCourse.com  Fact Sheet for Healthcare Providers:  SeriousBroker.it  This test is no t yet approved or cleared by the [place] FDA and  has been authorized for detection and/or diagnosis of SARS-CoV-2 by FDA under an Emergency Use Authorization (EUA). This EUA will remain  in effect (meaning this test can be used) for the duration of the COVID-19  declaration under Section 564(b)(1) of the Act, 21 U.S.C.section 360bbb-3(b)(1), unless  the authorization is terminated  or revoked sooner.       Influenza A by PCR NEGATIVE NEGATIVE Final   Influenza B by PCR NEGATIVE NEGATIVE Final    Comment: (NOTE) The Xpert Xpress SARS-CoV-2/FLU/RSV plus assay is intended as an aid in the diagnosis of influenza from Nasopharyngeal swab specimens and should not be used as a sole basis for treatment. Nasal washings and aspirates are unacceptable for Xpert Xpress SARS-CoV-2/FLU/RSV testing.  Fact Sheet for Patients: BloggerCourse.comhttps://www.fda.gov/media/152166/download  Fact Sheet for Healthcare Providers: SeriousBroker.ithttps://www.fda.gov/media/152162/download  This test is not yet approved or cleared by the [place] FDA and has been authorized for detection and/or diagnosis of SARS-CoV-2 by FDA under an Emergency Use Authorization (EUA). This EUA will remain in effect (meaning this test can be used) for the duration of the COVID-19 declaration under Section 564(b)(1) of the Act, 21 U.S.C. section 360bbb-3(b)(1), unless the authorization is terminated or revoked.  Performed at [facility], [address] [place], [place] [id number]   Time coordinating discharge: 25 minutes  SIGNED: Lanae Boastamesh Senon Nixon, MD  [facility] 04/28/2021, 10:28 AM  If 7PM-7AM, please contact night-coverage [email or web address]

## 2021-04-28 NOTE — Telephone Encounter (Signed)
Per chart review tab pt was admitted to [facility] on 04/27/21. Sending note to Dr Milinda Antis and Esmond CMA.

## 2021-04-28 NOTE — ED Provider Notes (Signed)
°  11:30 PM  Assumed care of patient at shift change.  Briefly, patient is a 71 year old female with recent angioedema requiring intubation who presents today with left tongue swelling that started at 8 PM.  Received IV Solu-Medrol, Benadryl, Pepcid and an EpiPen here reports swelling has improved slightly.  She feels like she has no difficulty speaking, swallowing or breathing.  On exam, patient has unilateral left tongue swelling but no angioedema of the lips, swelling of the posterior oropharynx.  She has no trismus, drooling and has normal phonation.  Sats 95% on room air.  No wheezing.  No urticaria.  She is not on an ACE inhibitor.  No family history of angioedema.  Is supposed to follow-up with an allergy specialist as an outpatient.  Given her previous significant history I have recommended that we observe her in the hospital given this was so severe last time it required intubation and I am worried that with it being the middle of the night that she could decompensate while sleeping and I would like to monitor her further.  Patient and husband are comfortable with this plan.  12:05 AM Consulted and discussed patient's case with hospitalist, Dr. Allena Katz.  I have recommended admission and consulting physician agrees and will place admission orders.  Patient (and family if present) agree with this plan.   I reviewed all nursing notes, vitals, pertinent previous records.  All labs, EKGs, imaging ordered have been independently reviewed and interpreted by myself.    Sharon [name], Layla Maw, DO 04/28/21 0007

## 2021-04-28 NOTE — ED Notes (Signed)
Pt and family asking about plan of care and when pt can eat. MD notified.

## 2021-04-29 ENCOUNTER — Encounter: Payer: Self-pay | Admitting: Allergy

## 2021-04-29 ENCOUNTER — Other Ambulatory Visit: Payer: Self-pay

## 2021-04-29 ENCOUNTER — Ambulatory Visit: Payer: Medicare Other | Admitting: Allergy

## 2021-04-29 VITALS — BP 140/90 | HR 89 | Temp 98.5°F | Resp 16 | Ht 66.0 in | Wt 213.8 lb

## 2021-04-29 DIAGNOSIS — T783XXD Angioneurotic edema, subsequent encounter: Secondary | ICD-10-CM

## 2021-04-29 DIAGNOSIS — T783XXA Angioneurotic edema, initial encounter: Secondary | ICD-10-CM

## 2021-04-29 LAB — C4 COMPLEMENT: Complement C4, Body Fluid: 40 mg/dL — ABNORMAL HIGH (ref 12–38)

## 2021-04-29 NOTE — Progress Notes (Signed)
New Patient Note  RE: Sharon [name] MRN: [id number] DOB: 27-Oct-1950 Date of Office Visit: 04/29/2021  Referring provider: Abner Greenspan, MD Primary care provider: Abner Greenspan, MD  Chief Complaint: Swelling  History of present illness: Sharon [name] is a 71 y.o. female presenting today for evaluation of angioedema.  She presents today with her husband. She was hospitalized from 04/17/2021 to 04/25/2021 for acute respiratory failure secondary to angioedema that required intubation. Her angioedema was presumed to be secondary to her ARB medication as she was on losartan.  This was discontinued.During this admission she was found to have leukocytosis, acute renal failure, hypotension secondary to vent sedation.  Her renal function normalized.  Her blood pressure control was changed to hydralazine.  She was treated with systemic steroids, H1 and H2 blockers.  She has been prescribed an epinephrine device.  She presented back to the ED on 04/27/2021 with another episode of angioedema of the tongue.  In the ED she received Solu-Medrol, Benadryl, Pepcid and epinephrine.  She states Christmas day was the start of everything.  She had dinner at her sons house and states her tongue started swelling.  It continued to swell and she was taken to ED where she required intubation as above.   They state there was nothing out of the ordinary with their day/routine.  She had not eaten any new/different foods 04/17/21 or 04/27/20.  On 04/17/21 they had ham, asparagus, potato salad, dessert (can't remember).  2 days ago she remembers eating honey nut cheerios and cheese but does not recall what else she may have eaten that day.   They have not had any changes to detergents/soaps/lotions/body products.  She does report having tick/chigger bites in the past.  No preceding illnesses.  She denies angioedema prior to this.  She denies having any rash with the swelling recently.  However she states she has had a  rash that comes and goes that she states makes "blisters" and it recur several times a month and can be itchy.  She has seen dermatology for this rash as she states she has had it for years now and has had biopsy and states that the biopsy only showed "adipose tissue".  Thus she is not sure what this rash is. She denies a family history of angioedema.  Since discharge from the ED she has been on prednisone course as well as Pepcid.  He is not taking a H1 blocker at this time.  Lab work reviewed as below.  Review of systems: Review of Systems  Constitutional: Negative.   HENT: Negative.    Eyes: Negative.   Respiratory: Negative.    Cardiovascular: Negative.   Gastrointestinal: Negative.   Musculoskeletal: Negative.   Skin: Negative.        See HPI  Allergic/Immunologic: Negative.   Neurological: Negative.    All other systems negative unless noted above in HPI  Past medical history: Past Medical History:  Diagnosis Date   Angio-edema    Anxiety    Cataract    GERD (gastroesophageal reflux disease)    Hyperlipidemia    Hypertension    Hypothyroidism    Vitamin D deficiency     Past surgical history: Past Surgical History:  Procedure Laterality Date   CATARACT EXTRACTION W/ INTRAOCULAR LENS IMPLANT Right 12/29/2016   Dr. Darleen Crocker   CATARACT EXTRACTION W/ INTRAOCULAR LENS IMPLANT Left 01/15/2017   Dr. Darleen Crocker   COLONOSCOPY     LEEP  TUBAL LIGATION     VEIN LIGATION AND STRIPPING     x2    Family history:  Family History  Problem Relation Age of Onset   Transient ischemic attack Mother    Hypertension Mother    Hypertension Father    Colon cancer Neg Hx    Esophageal cancer Neg Hx    Rectal cancer Neg Hx    Stomach cancer Neg Hx     Social history: Lives in a home with out carpeting with electric heating and heat pump cooling.  No pets in the home.  There is no concern for water damage, mildew or roaches in the home.  She is retired.  She  denies smoking or vaping.   Medication List: Current Outpatient Medications  Medication Sig Dispense Refill   albuterol (PROAIR HFA) 108 (90 Base) MCG/ACT inhaler TAKE 2 PUFFS BY MOUTH EVERY 6 HOURS AS NEEDED FOR WHEEZE OR SHORTNESS OF BREATH 8.5 each 2   aspirin 81 MG tablet Take 81 mg by mouth daily.     calcium carbonate (TUMS EX) 750 MG chewable tablet Chew 2 tablets by mouth at bedtime.     Cholecalciferol (VITAMIN D) 1000 UNITS capsule Take 2,000 Units by mouth daily.      EPINEPHrine 0.3 mg/0.3 mL IJ SOAJ injection Inject 0.3 mg into the muscle as needed for anaphylaxis. 1 each 1   famotidine (PEPCID) 20 MG tablet Take 1 tablet (20 mg total) by mouth 2 (two) times daily for 5 days. 10 tablet 0   fluticasone (FLONASE) 50 MCG/ACT nasal spray USE 2 SPRAYS IN BOTH  NOSTRILS DAILY 48 g 0   hydrALAZINE (APRESOLINE) 25 MG tablet Take 1 tablet (25 mg total) by mouth every 8 (eight) hours. 90 tablet 0   levothyroxine (SYNTHROID) 50 MCG tablet TAKE 1 TABLET BY MOUTH  DAILY BEFORE BREAKFAST 90 tablet 1   pantoprazole (PROTONIX) 40 MG tablet TAKE 1 TABLET BY MOUTH  DAILY 90 tablet 1   predniSONE (DELTASONE) 20 MG tablet Take 1 tablet (20 mg total) by mouth daily for 5 days. 5 tablet 0   sertraline (ZOLOFT) 100 MG tablet TAKE 1 TABLET BY MOUTH  DAILY 90 tablet 1   No current facility-administered medications for this visit.    Known medication allergies: Allergies  Allergen Reactions   Cozaar [Losartan] Swelling   Amlodipine Besylate     REACTION: edema   Atorvastatin     REACTION: muscle pain   Neomycin-Bacitracin Zn-Polymyx     REACTION: rash Per pt not sure if from meds or not     Physical examination: Blood pressure 140/90, pulse 89, temperature 98.5 F (36.9 C), resp. rate 16, height 5\' 6"  (1.676 m), weight 213 lb 12.8 oz (97 kg), SpO2 98 %.  General: Alert, interactive, in no acute distress. HEENT: PERRLA, TMs pearly gray, turbinates non-edematous without discharge,  post-pharynx non erythematous, no evidence of edema of tongue or uvula. Neck: Supple without lymphadenopathy. Lungs: Clear to auscultation without wheezing, rhonchi or rales. {no increased work of breathing. CV: Normal S1, S2 without murmurs. Abdomen: Nondistended, nontender. Skin: Warm and dry, without lesions or rashes. Extremities:  No clubbing, cyanosis or edema. Neuro:   Grossly intact.  Diagnositics/Labs: Labs:  Component     Latest Ref Rng & Units 04/17/2021 04/28/2021  WBC     4.0 - 10.5 K/uL 7.6   RBC     3.87 - 5.11 MIL/uL 4.08   Hemoglobin     12.0 - 15.0  g/dL 13.0   HCT     36.0 - 46.0 % 39.9   MCV     80.0 - 100.0 fL 97.8   MCH     26.0 - 34.0 pg 31.9   MCHC     30.0 - 36.0 g/dL 32.6   RDW     11.5 - 15.5 % 13.1   Platelets     150 - 400 K/uL 225   nRBC     0.0 - 0.2 % 0.0   Neutrophils     % 56   NEUT#     1.7 - 7.7 K/uL 4.2   Lymphocytes     % 32   Lymphocyte #     0.7 - 4.0 K/uL 2.4   Monocytes Relative     % 7   Monocyte #     0.1 - 1.0 K/uL 0.5   Eosinophil     % 4   Eosinophils Absolute     0.0 - 0.5 K/uL 0.3   Basophil     % 1   Basophils Absolute     0.0 - 0.1 K/uL 0.0   Immature Granulocytes     % 0   Abs Immature Granulocytes     0.00 - 0.07 K/uL 0.03   Sodium     135 - 145 mmol/L 135   Potassium     3.5 - 5.1 mmol/L 3.6   Chloride     98 - 111 mmol/L 100   CO2     22 - 32 mmol/L 29   Glucose     70 - 99 mg/dL 108 (H)   BUN     8 - 23 mg/dL 19   Creatinine     0.44 - 1.00 mg/dL 1.06 (H)   Calcium     8.9 - 10.3 mg/dL 9.5   Total Protein     6.5 - 8.1 g/dL 7.8   Albumin     3.5 - 5.0 g/dL 4.1   AST     15 - 41 U/L 20   ALT     0 - 44 U/L 11   Alkaline Phosphatase     38 - 126 U/L 67   Total Bilirubin     0.3 - 1.2 mg/dL 0.7   GFR, Estimated     >60 mL/min 57 (L)   Anion gap     5 - 15 6   Iron     28 - 170 ug/dL  58  TIBC     250 - 450 ug/dL  356  Saturation Ratios     10.4 - 31.8 %  17  UIBC     ug/dL   295  C1INH SerPl-mCnc     21 - 39 mg/dL 37   Complement C4, Body Fluid     12 - 38 mg/dL 26 40 (H)  C3 Complement     82 - 167 mg/dL 139   IgE (Immunoglobulin E), Serum     6 - 495 IU/mL 49   TSH     0.350 - 4.500 uIU/mL 6.255 (H)   T4,Free(Direct)     0.61 - 1.12 ng/dL 1.01   Vitamin B12     180 - 914 pg/mL  392  Folate     >5.9 ng/mL  14.1  Ferritin     11 - 307 ng/mL  214   Assessment and plan: Angioedema, acute - swelling can be due to reactions and is histamine driven  or could to due to non-histamine driven processes like medications you were on or other entities like hereditary angioedema -reviewed labwork already done and will need additional work-up with the following: alpha gal testing, tryptase level, environmental allergy panel, hereditary angioedema panel -if you have another swelling/reaction episode and you are treated in the ED/UC where labs can be drawn would request a tryptase level be done to help determine if this is an allergic reaction that is histamine driven.   Provided with lab order today to provide in future in needed -recommend taking Allegra 180mg  1 tab twice a day with Pepcid 20mg  1 tab twice a day for now -complete prednisone course -have access to self-injectable epinephrine Epipen 0.3mg  at all times -follow emergency action plan in case of allergic reaction -avoid red meat products in diet until labs return -do not take medications belonging to the ACEI or ARB families anymore.   It is most likely that your oral swelling was due to Cozaar as this is an ARB which is a medication category known to cause oral swelling.  You can still have some swelling episodes for a while even after this medication is stopped.    Follow-up in 3 months or sooner if needed I appreciate the opportunity to take part in [place] care. Please do not hesitate to contact me with questions.  Sincerely,   Prudy Feeler, MD Allergy/Immunology [facility] [place] [facility]  [place]

## 2021-04-29 NOTE — Patient Instructions (Signed)
Angioedema (swelling) - swelling can be due to reactions and is histamine driven or could to due to non-histamine driven processes like medications you were on or other entities like hereditary angioedema -reviewed labwork already done and will need additional work-up with the following: alpha gal testing, tryptase level, environmental allergy panel, hereditary angioedema panel -if you have another swelling/reaction episode and you are treated in the ED/UC where labs can be drawn would request a tryptase level be done to help determine if this is an allergic reaction that is histamine driven.   Provided with lab order today to provide in future in needed -recommend taking Allegra 180mg  1 tab twice a day with Pepcid 20mg  1 tab twice a day for now -complete prednisone course -have access to self-injectable epinephrine Epipen 0.3mg  at all times -follow emergency action plan in case of allergic reaction -avoid red meat products in diet until labs return -do not take medications belonging to the ACEI or ARB families anymore.   It is most likely that your oral swelling was due to Cozaar as this is an ARB which is a medication category known to cause oral swelling.  You can still have some swelling episodes for a while even after this medication is stopped.    Follow-up in 3 months or sooner if needed

## 2021-05-02 ENCOUNTER — Other Ambulatory Visit: Payer: Medicare Other

## 2021-05-02 DIAGNOSIS — R197 Diarrhea, unspecified: Secondary | ICD-10-CM

## 2021-05-02 LAB — SPECIMEN STATUS

## 2021-05-03 LAB — C. DIFFICILE GDH AND TOXIN A/B
GDH ANTIGEN: NOT DETECTED
MICRO NUMBER:: 12844971
SPECIMEN QUALITY:: ADEQUATE
TOXIN A AND B: NOT DETECTED

## 2021-05-09 ENCOUNTER — Encounter: Payer: Self-pay | Admitting: Family Medicine

## 2021-05-10 MED ORDER — SERTRALINE HCL 100 MG PO TABS: 100.0000 mg | ORAL_TABLET | Freq: Every day | ORAL | 1 refills | Status: DC

## 2021-05-10 MED ORDER — PANTOPRAZOLE SODIUM 40 MG PO TBEC: 40.0000 mg | DELAYED_RELEASE_TABLET | Freq: Every day | ORAL | 1 refills | Status: DC

## 2021-05-10 MED ORDER — LEVOTHYROXINE SODIUM 50 MCG PO TABS: 50.0000 ug | ORAL_TABLET | Freq: Every day | ORAL | 1 refills | Status: DC

## 2021-05-10 NOTE — Telephone Encounter (Signed)
Meds refilled.

## 2021-05-17 LAB — OTHER LAB TEST

## 2021-05-19 LAB — ALLERGENS W/TOTAL IGE AREA 2
Alternaria Alternata IgE: <0.1 kU/L
Aspergillus Fumigatus IgE: <0.1 kU/L
Bermuda Grass IgE: <0.1 kU/L
Cat Dander IgE: 4.46 kU/L — AB
Cedar, Mountain IgE: <0.1 kU/L
Cladosporium Herbarum IgE: <0.1 kU/L
Cockroach, German IgE: <0.1 kU/L
Common Silver Birch IgE: <0.1 kU/L
Cottonwood IgE: <0.1 kU/L
D Farinae IgE: 1.8 kU/L — AB
D Pteronyssinus IgE: 0.94 kU/L — AB
Dog Dander IgE: 0.18 kU/L — AB
Elm, American IgE: <0.1 kU/L
Johnson Grass IgE: <0.1 kU/L
Maple/Box Elder IgE: <0.1 kU/L
Mouse Urine IgE: <0.1 kU/L
Oak, White IgE: <0.1 kU/L
Pecan, Hickory IgE: <0.1 kU/L
Penicillium Chrysogen IgE: <0.1 kU/L
Pigweed, Rough IgE: <0.1 kU/L
Ragweed, Short IgE: <0.1 kU/L
Sheep Sorrel IgE Qn: <0.1 kU/L
Timothy Grass IgE: <0.1 kU/L
White Mulberry IgE: <0.1 kU/L

## 2021-05-19 LAB — C4 COMPLEMENT: Complement C4, Serum: 43 mg/dL — ABNORMAL HIGH (ref 12–38)

## 2021-05-19 LAB — C1 ESTERASE INHIBITOR: C1INH SerPl-mCnc: 45 mg/dL — ABNORMAL HIGH (ref 21–39)

## 2021-05-19 LAB — C1 ESTERASE INHIBITOR, FUNCTIONAL: C1INH Functional/C1INH Total MFr SerPl: 90 %mean normal

## 2021-05-19 LAB — ALPHA-GAL PANEL
Allergen Lamb IgE: <0.1 kU/L
Beef IgE: 0.11 kU/L — AB
IgE (Immunoglobulin E), Serum: 75 IU/mL (ref 6–495)
O215-IgE Alpha-Gal: 0.19 kU/L — AB
Pork IgE: <0.1 kU/L

## 2021-05-19 LAB — COMPLEMENT COMPONENT C1Q: Complement C1Q: 20.7 mg/dL — ABNORMAL HIGH (ref 10.3–20.5)

## 2021-05-20 ENCOUNTER — Encounter: Payer: Self-pay | Admitting: Allergy

## 2021-05-20 ENCOUNTER — Encounter: Payer: Self-pay | Admitting: Family Medicine

## 2021-05-24 ENCOUNTER — Encounter: Payer: Self-pay | Admitting: *Deleted

## 2021-05-26 ENCOUNTER — Encounter: Payer: Self-pay | Admitting: Family Medicine

## 2021-05-26 MED ORDER — HYDRALAZINE HCL 25 MG PO TABS: 25.0000 mg | ORAL_TABLET | Freq: Three times a day (TID) | ORAL | 1 refills | Status: DC

## 2021-05-26 NOTE — Telephone Encounter (Signed)
Oncologist gave pt a one month supply of med on 04/25/21 #90 with 0 refills, pt is requesting PCP to fill med .  Last OV was a hospital f/u on 04/27/21

## 2021-06-01 ENCOUNTER — Telehealth: Payer: Self-pay

## 2021-06-01 NOTE — Telephone Encounter (Signed)
Called the patient to let her know that the lab technician needed her to come back in to redo her hereditary swelling lab because the original one could not be done with the sample given. Patient stated that she understood and that she would come in when she could.

## 2021-06-08 ENCOUNTER — Other Ambulatory Visit: Payer: Self-pay | Admitting: Family Medicine

## 2021-06-08 ENCOUNTER — Telehealth: Payer: Self-pay

## 2021-06-08 DIAGNOSIS — T783XXD Angioneurotic edema, subsequent encounter: Secondary | ICD-10-CM

## 2021-06-08 NOTE — Telephone Encounter (Signed)
Patient came in today for repeat lab work.

## 2021-06-09 LAB — SPECIMEN STATUS

## 2021-06-11 ENCOUNTER — Other Ambulatory Visit: Payer: Self-pay

## 2021-06-11 ENCOUNTER — Emergency Department
Admission: EM | Admit: 2021-06-11 | Discharge: 2021-06-11 | Disposition: A | Discharge status: Home / Self Care | Payer: Medicare Other | Attending: Emergency Medicine | Admitting: Emergency Medicine

## 2021-06-11 DIAGNOSIS — I10 Essential (primary) hypertension: Secondary | ICD-10-CM | POA: Diagnosis not present

## 2021-06-11 DIAGNOSIS — T783XXA Angioneurotic edema, initial encounter: Secondary | ICD-10-CM

## 2021-06-11 DIAGNOSIS — E039 Hypothyroidism, unspecified: Secondary | ICD-10-CM | POA: Diagnosis not present

## 2021-06-11 DIAGNOSIS — K1379 Other lesions of oral mucosa: Secondary | ICD-10-CM | POA: Diagnosis present

## 2021-06-11 LAB — BASIC METABOLIC PANEL
Anion gap: 8 (ref 5–15)
BUN: 13 mg/dL (ref 8–23)
CO2: 26 mmol/L (ref 22–32)
Calcium: 9.5 mg/dL (ref 8.9–10.3)
Chloride: 100 mmol/L (ref 98–111)
Creatinine, Ser: 1.08 mg/dL — ABNORMAL HIGH (ref 0.44–1.00)
GFR, Estimated: 55 mL/min — ABNORMAL LOW (ref >60)
Glucose, Bld: 135 mg/dL — ABNORMAL HIGH (ref 70–99)
Potassium: 3.4 mmol/L — ABNORMAL LOW (ref 3.5–5.1)
Sodium: 134 mmol/L — ABNORMAL LOW (ref 135–145)

## 2021-06-11 LAB — CBC WITH DIFFERENTIAL/PLATELET
Abs Immature Granulocytes: 0.03 10*3/uL (ref 0.00–0.07)
Basophils Absolute: 0 10*3/uL (ref 0.0–0.1)
Basophils Relative: 0 %
Eosinophils Absolute: 0.3 10*3/uL (ref 0.0–0.5)
Eosinophils Relative: 4 %
HCT: 42 % (ref 36.0–46.0)
Hemoglobin: 13.3 g/dL (ref 12.0–15.0)
Immature Granulocytes: 0 %
Lymphocytes Relative: 36 %
Lymphs Abs: 2.5 10*3/uL (ref 0.7–4.0)
MCH: 30.5 pg (ref 26.0–34.0)
MCHC: 31.7 g/dL (ref 30.0–36.0)
MCV: 96.3 fL (ref 80.0–100.0)
Monocytes Absolute: 0.5 10*3/uL (ref 0.1–1.0)
Monocytes Relative: 7 %
Neutro Abs: 3.6 10*3/uL (ref 1.7–7.7)
Neutrophils Relative %: 53 %
Platelets: 258 10*3/uL (ref 150–400)
RBC: 4.36 MIL/uL (ref 3.87–5.11)
RDW: 13.4 % (ref 11.5–15.5)
WBC: 7 10*3/uL (ref 4.0–10.5)
nRBC: 0 % (ref 0.0–0.2)

## 2021-06-11 MED ORDER — METHYLPREDNISOLONE SODIUM SUCC 125 MG IJ SOLR
125.0000 mg | Freq: Once | INTRAMUSCULAR | Status: AC
  Administered 2021-06-11: 125 mg via INTRAVENOUS
  Filled 2021-06-11: qty 2

## 2021-06-11 MED ORDER — FAMOTIDINE IN NACL 20-0.9 MG/50ML-% IV SOLN
20.0000 mg | Freq: Once | INTRAVENOUS | Status: AC
  Administered 2021-06-11: 20 mg via INTRAVENOUS
  Filled 2021-06-11: qty 50

## 2021-06-11 MED ORDER — DIPHENHYDRAMINE HCL 50 MG/ML IJ SOLN
50.0000 mg | Freq: Once | INTRAMUSCULAR | Status: AC
  Administered 2021-06-11: 50 mg via INTRAVENOUS
  Filled 2021-06-11: qty 1

## 2021-06-11 MED ORDER — EPINEPHRINE 0.3 MG/0.3ML IJ SOAJ
0.3000 mg | Freq: Once | INTRAMUSCULAR | Status: AC
  Administered 2021-06-11: 0.3 mg via INTRAMUSCULAR
  Filled 2021-06-11: qty 0.3

## 2021-06-11 NOTE — ED Notes (Signed)
Patient resting comfortably on stretcher. RR even and unlabored. Patient updated on plan of care. Patient verbalizes no needs or complaints. Husband at bedside.

## 2021-06-11 NOTE — ED Notes (Signed)
Discharge instructions provided to patient along with follow-up info. Patient ambulated out t the waiting room with a steady gat

## 2021-06-11 NOTE — ED Provider Notes (Signed)
[facility] Provider Note    Event Date/Time   First MD Initiated Contact with Patient 06/11/21 1835     (approximate)   History   Oral Swelling   HPI  Sharon [name] is a 71 y.o. female with history of hypertension, GERD, hypothyroidism, anxiety, and recent admissions for angioedema who presents with tongue swelling, acute onset around 1 PM today, persistent throughout the afternoon, but not significantly worsening over the last several hours.  She states it feels similar to when she was observed overnight last month.  It is mainly on the right side of the tongue.  Patient reports starting to feel slightly short of breath.  She denies any other abnormal swelling or rash.  She did not take anything for it at home.    Physical Exam   Triage Vital Signs: ED Triage Vitals  Enc Vitals Group     BP 06/11/21 1824 (!) 151/80     Pulse Rate 06/11/21 1824 (!) 111     Resp 06/11/21 1824 20     Temp 06/11/21 1824 97.9 F (36.6 C)     Temp Source 06/11/21 1824 Oral     SpO2 06/11/21 1824 98 %     Weight 06/11/21 1825 200 lb (90.7 kg)     Height 06/11/21 1825 5\' 6"  (1.676 m)     Head Circumference --      Peak Flow --      Pain Score 06/11/21 1825 0     Pain Loc --      Pain Edu? --      Excl. in GC? --     Most recent vital signs: Vitals:   06/11/21 1930 06/11/21 2100  BP: 139/74 (!) 142/75  Pulse: 92 93  Resp: 17 18  Temp:    SpO2: 96% 97%    General: Awake, no distress.  CV:  Good peripheral perfusion.  Resp:  Normal effort.  Lungs CTA B. Abd:  No distention.  Other:  Mild swelling to the right side of the tongue consistent with angioedema.  No swelling to the posterior oropharynx.  No stridor or pooled secretions.  Clear voice.   ED Results / Procedures / Treatments   Labs (all labs ordered are listed, but only abnormal results are displayed) Labs Reviewed  BASIC METABOLIC PANEL - Abnormal; Notable for the following components:       Result Value   Sodium 134 (*)    Potassium 3.4 (*)    Glucose, Bld 135 (*)    Creatinine, Ser 1.08 (*)    GFR, Estimated 55 (*)    All other components within normal limits  CBC WITH DIFFERENTIAL/PLATELET  TRYPTASE     EKG     RADIOLOGY   PROCEDURES:  Critical Care performed: Yes, see critical care procedure note(s)  .Critical Care Performed by: 06/13/21, MD Authorized by: Dionne Bucy, MD   Critical care provider statement:    Critical care time (minutes):  30   Critical care was necessary to treat or prevent imminent or life-threatening deterioration of the following conditions:  Respiratory failure   Critical care was time spent personally by me on the following activities:  Development of treatment plan with patient or surrogate, discussions with consultants, evaluation of patient's response to treatment, examination of patient, ordering and review of laboratory studies, ordering and review of radiographic studies, ordering and performing treatments and interventions, pulse oximetry, re-evaluation of patient's condition and review of old charts  MEDICATIONS ORDERED IN ED: Medications  EPINEPHrine (EPI-PEN) injection 0.3 mg (0.3 mg Intramuscular Given 06/11/21 1839)  methylPREDNISolone sodium succinate (SOLU-MEDROL) 125 mg/2 mL injection 125 mg (125 mg Intravenous Given 06/11/21 1840)  famotidine (PEPCID) IVPB 20 mg premix (0 mg Intravenous Stopped 06/11/21 1900)  diphenhydrAMINE (BENADRYL) injection 50 mg (50 mg Intravenous Given 06/11/21 1840)     IMPRESSION / MDM / ASSESSMENT AND PLAN / ED COURSE  I reviewed the triage vital signs and the nursing notes.  71 year old female with PMH as noted above presents with right tongue swelling over the last several hours associated with mild shortness of breath.   I reviewed the past medical records including discharge summaries from the patient's last 2 admissions on 04/28/2021 and 04/25/2021.  At the end of  December the patient presented with tongue swelling that was determined to likely be due to an ARB.  Due to the degree and progression of the swelling she was electively intubated and admitted to the ICU.  She subsequently presented to the ED on 1/4 with milder, more gradual swelling, and was given medications and observed overnight.  The patient states that her current presentation feels similar to this second presentation.  The patient has also been seen in the interim by allergy and immunology on 1/6 and diagnosed with likely angioedema although the etiology is unclear.  Differential diagnosis includes, but is not limited to, recurrent angioedema related to the prior Cozaar use, hereditary angioedema, new allergic reaction.  At this time, the patient's airway is stable.  Her vital signs are normal except for mild tachycardia.  We will give an EpiPen, Solu-Medrol, Benadryl, and Pepcid, and observe in the ED for at least the next several hours.  At this time, there is no indication for urgent airway intervention.  The patient is on the cardiac monitor to evaluate for evidence of arrhythmia and/or significant heart rate changes.  ----------------------------------------- 10:22 PM on 06/11/2021 -----------------------------------------  BMP and CBC are within normal limits.  The patient reported marked improvement in the swelling shortly after receiving the initial medications.  She has now been in the ED for over 4 hours and states that the swelling has almost completely resolved and her voice and breathing are normal.  I offered the patient observation admission, however she expresses a strong preference to go home, which I think is reasonable given the response to treatment.  The patient has an EpiPen at home.  I gave her thorough return precautions and she expressed understanding.  She will follow-up with her primary care doctor and allergist.   FINAL CLINICAL IMPRESSION(S) / ED DIAGNOSES    Final diagnoses:  Angioedema, initial encounter     Rx / DC Orders   ED Discharge Orders     None        Note:  This document was prepared using Dragon voice recognition software and may include unintentional dictation errors.    Dionne Bucy, MD 06/11/21 2223

## 2021-06-11 NOTE — ED Notes (Signed)
Provider at bedside to speak with patient and visitor regarding plan of care

## 2021-06-11 NOTE — ED Triage Notes (Signed)
Pt states she started having tongue swelling starting around 1300- pt R side of tongue notably swollen- pt does state it feels like it is getting harder to breath- pt denies taking any ACE inhibitors- pt states she has been hospitalized for a reaction in Dec

## 2021-06-11 NOTE — Discharge Instructions (Signed)
Follow-up with your allergist as scheduled.  Use your EpiPen and return to the ER immediately for new, worsening, or recurrent tongue swelling, lip swelling, change in your voice, difficulty speaking, shortness of breath, or any other new or worsening symptoms that concern you.

## 2021-06-11 NOTE — ED Notes (Signed)
Patient resting comfortably on stretcher. RR even and unlabored. Patient updated on plan of care. Patient verbalizes no needs or complaints. Husband at bedside. °

## 2021-06-11 NOTE — ED Notes (Signed)
Patient resting comfortably on stretcher. RR even and unlabored. Patient states that she bit her tongue last night but this is nothing new for her. She does have some bruising on the right lateral side of her tongue. Patient states she also feels "jittery" after receiving the steroids. Otherwise, she feels fine. Patient verbalizes no other needs.

## 2021-06-13 LAB — TRYPTASE: Tryptase: 6.5 ug/L (ref 2.2–13.2)

## 2021-06-14 ENCOUNTER — Encounter: Payer: Self-pay | Admitting: Family Medicine

## 2021-06-15 MED ORDER — HYDRALAZINE HCL 25 MG PO TABS: 25.0000 mg | ORAL_TABLET | Freq: Three times a day (TID) | ORAL | 1 refills | Status: DC

## 2021-07-12 LAB — OTHER LAB TEST

## 2021-07-21 ENCOUNTER — Encounter: Payer: Self-pay | Admitting: Family Medicine

## 2021-07-22 NOTE — Telephone Encounter (Signed)
I spoke with pt; pt said since d/c from hospital in Jan 2023 pt has had swelling in both lower extremities with the lt lower leg and foot more swollen than the right. Pt said swelling is no worse than when started in Jan but the swelling does not go down overnight with legs elevated. No pain or redness in lower legs or feet. Pt said sometimes when she moves lt ankle may have some pain due to tightness of skin. No CP or SOB. Dr Milinda Antis out of office until 07/26/21 and I Offered pt appt with provider in office today but pt said she wanted to wait and see Dr Milinda Antis. UC & ED precautions given and pt voiced understanding. Pt scheduled appt with Dr Milinda Antis on 07/26/21 at 12 noon. Sending note to DR Milinda Antis who is out of office and Dr Alphonsus Sias who is in office and [place] CMA. ?

## 2021-07-22 NOTE — Telephone Encounter (Signed)
Sounds like she is stable to await appointment with Dr Milinda Antis next week ?

## 2021-07-26 ENCOUNTER — Ambulatory Visit (INDEPENDENT_AMBULATORY_CARE_PROVIDER_SITE_OTHER): Payer: Medicare Other | Admitting: Family Medicine

## 2021-07-26 ENCOUNTER — Encounter: Payer: Self-pay | Admitting: Family Medicine

## 2021-07-26 VITALS — BP 116/76 | HR 96 | Temp 97.2°F | Ht 66.0 in | Wt 211.1 lb

## 2021-07-26 DIAGNOSIS — E039 Hypothyroidism, unspecified: Secondary | ICD-10-CM | POA: Diagnosis not present

## 2021-07-26 DIAGNOSIS — I10 Essential (primary) hypertension: Secondary | ICD-10-CM | POA: Diagnosis not present

## 2021-07-26 DIAGNOSIS — R600 Localized edema: Secondary | ICD-10-CM | POA: Insufficient documentation

## 2021-07-26 LAB — TSH: TSH: 2.19 u[IU]/mL (ref 0.35–5.50)

## 2021-07-26 MED ORDER — HYDRALAZINE HCL 25 MG PO TABS: 12.5000 mg | ORAL_TABLET | Freq: Three times a day (TID) | ORAL | 0 refills | Status: DC

## 2021-07-26 NOTE — Progress Notes (Signed)
? ?Subjective:  ? ? Patient ID: Sharon [name], female    DOB: 1951/04/17, 71 y.o.   MRN: [id number] ? ?HPI ?Pt presents for leg/foot swelling worse on the L  ? ?Wt Readings from Last 3 Encounters:  ?07/26/21 211 lb 2 oz (95.8 kg)  ?06/11/21 200 lb (90.7 kg)  ?04/29/21 213 lb 12.8 oz (97 kg)  ? ?34.08 kg/m? ? ? ?Pt called noting that since d/c from hosp in jan 2023 her legs have been more swollen-worse on the left ?It does go down with elevation and at night  ?No pain or redness ?Skin feels tight  ? ?Sees Dr Mariah Milling for cardiology (atypical light headednes on/off)  ? ? ?HTN ?Really tries to limit sodium and drink enough water  ?Takes hydralazine 25 mg tid  ? ?BP Readings from Last 3 Encounters:  ?07/26/21 116/76  ?06/11/21 138/71  ?04/29/21 140/90  ? ? ? ?Hypothyroid ?Lab Results  ?Component Value Date  ? TSH 6.255 (H) 04/17/2021  ? ?Levothyroxine 50 mcg daily  ? ?Is feeling more spacy lately  ? ? ?Eating better  ?No red meat at all  (alpha gal)  ? ?Dentist wants her to get a sleep study  ? ?R shoulder hurts again- may have to follow up with Dr Patsy Lager  ? ?Patient Active Problem List  ? Diagnosis Date Noted  ? Pedal edema 07/26/2021  ? Diarrhea 04/27/2021  ? Angioedema 04/17/2021  ? Dizziness 08/23/2020  ? Fatigue 08/23/2020  ? Elevated fasting glucose 12/17/2019  ? Encounter for hepatitis C screening test for low risk patient 12/17/2019  ? Medicare annual wellness visit, subsequent 08/28/2018  ? Cough 01/28/2018  ? Welcome to Medicare preventive visit 08/22/2016  ? Obesity 10/05/2014  ? Anemia 09/24/2013  ? Colon cancer screening 03/03/2013  ? Hypokalemia 10/09/2011  ? Routine general medical examination at a health care facility 10/02/2011  ? Vitamin D deficiency disease 12/28/2010  ? ARTHRALGIA 08/19/2007  ? Hypothyroidism 01/31/2007  ? Hyperlipidemia 01/31/2007  ? Anxiety state 01/31/2007  ? Essential hypertension 01/31/2007  ? GERD 01/31/2007  ? ?Past Medical History:  ?Diagnosis Date  ? Angio-edema   ?  Anxiety   ? Cataract   ? GERD (gastroesophageal reflux disease)   ? Hyperlipidemia   ? Hypertension   ? Hypothyroidism   ? Vitamin D deficiency   ? ?Past Surgical History:  ?Procedure Laterality Date  ? CATARACT EXTRACTION W/ INTRAOCULAR LENS IMPLANT Right 12/29/2016  ? Dr. Mia Creek  ? CATARACT EXTRACTION W/ INTRAOCULAR LENS IMPLANT Left 01/15/2017  ? Dr. Mia Creek  ? COLONOSCOPY    ? LEEP    ? TUBAL LIGATION    ? VEIN LIGATION AND STRIPPING    ? x2  ? ?Social History  ? ?Tobacco Use  ? Smoking status: Former  ?  Types: Cigarettes  ?  Quit date: 04/24/1996  ?  Years since quitting: 25.2  ? Smokeless tobacco: Never  ?Vaping Use  ? Vaping Use: Never used  ?Substance Use Topics  ? Alcohol use: Yes  ?  Alcohol/week: 0.0 standard drinks  ?  Comment: occ. (maybe every 3 mos.)  ? Drug use: No  ? ?Family History  ?Problem Relation Age of Onset  ? Transient ischemic attack Mother   ? Hypertension Mother   ? Hypertension Father   ? Colon cancer Neg Hx   ? Esophageal cancer Neg Hx   ? Rectal cancer Neg Hx   ? Stomach cancer Neg Hx   ? ?Allergies  ?  Allergen Reactions  ? Cozaar [Losartan] Swelling  ? Amlodipine Besylate   ?  REACTION: edema  ? Atorvastatin   ?  REACTION: muscle pain  ? Neomycin-Bacitracin Zn-Polymyx   ?  REACTION: rash ?Per pt not sure if from meds or not  ? ?Current Outpatient Medications on File Prior to Visit  ?Medication Sig Dispense Refill  ? albuterol (PROAIR HFA) 108 (90 Base) MCG/ACT inhaler TAKE 2 PUFFS BY MOUTH EVERY 6 HOURS AS NEEDED FOR WHEEZE OR SHORTNESS OF BREATH 8.5 each 2  ? aspirin 81 MG tablet Take 81 mg by mouth daily.    ? calcium carbonate (TUMS EX) 750 MG chewable tablet Chew 2 tablets by mouth at bedtime.    ? Cholecalciferol (VITAMIN D) 1000 UNITS capsule Take 1,000 Units by mouth daily.    ? EPINEPHrine 0.3 mg/0.3 mL IJ SOAJ injection Inject 0.3 mg into the muscle as needed for anaphylaxis. 1 each 1  ? fluticasone (FLONASE) 50 MCG/ACT nasal spray USE 2 SPRAYS IN BOTH  NOSTRILS  DAILY 48 g 0  ? levothyroxine (SYNTHROID) 50 MCG tablet Take 1 tablet (50 mcg total) by mouth daily before breakfast. 90 tablet 1  ? pantoprazole (PROTONIX) 40 MG tablet Take 1 tablet (40 mg total) by mouth daily. 90 tablet 1  ? sertraline (ZOLOFT) 100 MG tablet Take 1 tablet (100 mg total) by mouth daily. 90 tablet 1  ? ?No current facility-administered medications on file prior to visit.  ?  ?Review of Systems  ?Constitutional:  Positive for fatigue. Negative for activity change, appetite change, fever and unexpected weight change.  ?HENT:  Negative for congestion, ear pain, rhinorrhea, sinus pressure and sore throat.   ?Eyes:  Negative for pain, redness and visual disturbance.  ?Respiratory:  Negative for cough, shortness of breath and wheezing.   ?Cardiovascular:  Positive for leg swelling. Negative for chest pain and palpitations.  ?Gastrointestinal:  Negative for abdominal pain, blood in stool, constipation and diarrhea.  ?Endocrine: Negative for polydipsia and polyuria.  ?Genitourinary:  Negative for dysuria, frequency and urgency.  ?Musculoskeletal:  Positive for arthralgias. Negative for back pain and myalgias.  ?     Right shoulder pain   ?Skin:  Negative for pallor and rash.  ?Allergic/Immunologic: Negative for environmental allergies.  ?Neurological:  Negative for dizziness, syncope and headaches.  ?Hematological:  Negative for adenopathy. Does not bruise/bleed easily.  ?Psychiatric/Behavioral:  Positive for decreased concentration. Negative for dysphoric mood. The patient is not nervous/anxious.   ? ?   ?Objective:  ? Physical Exam ?Constitutional:   ?   General: She is not in acute distress. ?   Appearance: Normal appearance. She is well-developed. She is obese. She is not ill-appearing.  ?HENT:  ?   Head: Normocephalic and atraumatic.  ?Eyes:  ?   Conjunctiva/sclera: Conjunctivae normal.  ?   Pupils: Pupils are equal, round, and reactive to light.  ?Neck:  ?   Thyroid: No thyromegaly.  ?   Vascular:  No carotid bruit or JVD.  ?Cardiovascular:  ?   Rate and Rhythm: Normal rate and regular rhythm.  ?   Pulses: Normal pulses.  ?   Heart sounds: Normal heart sounds.  ?  No gallop.  ?Pulmonary:  ?   Effort: Pulmonary effort is normal. No respiratory distress.  ?   Breath sounds: Normal breath sounds. No wheezing or rales.  ?Abdominal:  ?   General: There is no distension or abdominal bruit.  ?   Palpations: Abdomen is soft.  ?  Musculoskeletal:  ?   Cervical back: Normal range of motion and neck supple.  ?   Right lower leg: Edema present.  ?   Left lower leg: Edema present.  ?   Comments: One plus pitting edema bilat LE to mid shin  ?No tenderness ?Mild varicosities ? ?No palp cords ?Neg homan's  ?Good perfusion   ?Lymphadenopathy:  ?   Cervical: No cervical adenopathy.  ?Skin: ?   General: Skin is warm and dry.  ?   Coloration: Skin is not pale.  ?   Findings: No rash.  ?Neurological:  ?   Mental Status: She is alert.  ?   Coordination: Coordination normal.  ?   Deep Tendon Reflexes: Reflexes are normal and symmetric. Reflexes normal.  ?Psychiatric:     ?   Mood and Affect: Mood normal.  ? ? ? ? ? ?   ?Assessment & Plan:  ? ?Problem List Items Addressed This Visit   ? ?  ? Cardiovascular and Mediastinum  ? Essential hypertension  ?  bp in fair control at this time  ?BP Readings from Last 1 Encounters:  ?07/26/21 116/76  ?Most recent labs reviewed  ?Disc lifstyle change with low sodium diet and exercise  ?Will decrease hydralazine dose from 25 to 12.5 mg tid for pedal edema and f/u in 3 months  ? ?  ?  ? Relevant Medications  ? hydrALAZINE (APRESOLINE) 25 MG tablet  ?  ? Endocrine  ? Hypothyroidism  ?  TSH today  ?Having some trouble concentrating and also pedal edema ?Takes levothyroxine 50 mcg daily correctly ?  ?  ? Relevant Orders  ? TSH  ?  ? Other  ? Pedal edema - Primary  ?  Pedal edema with reassuring exam ?Suspect multifactorial, with hydralazine and also hypothyroidism playing a role ?Also history of  varicose veins with surgery in the past ?We will try and cut hydralazine dose to 12.5 mg 3 times daily and follow-up in a month ?Encouraged use of support stockings as tolerated ?Also avoid prolonged standing, encour

## 2021-07-26 NOTE — Patient Instructions (Addendum)
Support socks/stockings will help swelling  ?Elevate feet when you can /sitting  ?Avoid prolonged standing  ? ?Cut the hydralazine dose to 1/2 pill three times daily  ?Follow up here in about a month  ? ?Let's re check thyroid again  ? ?If the shoulder keeps bothering you- call back and make an appt with Dr Patsy Lager  ? ? ?

## 2021-07-26 NOTE — Assessment & Plan Note (Signed)
Pedal edema with reassuring exam ?Suspect multifactorial, with hydralazine and also hypothyroidism playing a role ?Also history of varicose veins with surgery in the past ?We will try and cut hydralazine dose to 12.5 mg 3 times daily and follow-up in a month ?Encouraged use of support stockings as tolerated ?Also avoid prolonged standing, encouraged elevation of legs when sitting whenever possible ?Commended low-sodium diet ?

## 2021-07-26 NOTE — Assessment & Plan Note (Signed)
TSH today  ?Having some trouble concentrating and also pedal edema ?Takes levothyroxine 50 mcg daily correctly ?

## 2021-07-26 NOTE — Assessment & Plan Note (Signed)
bp in fair control at this time  ?BP Readings from Last 1 Encounters:  ?07/26/21 116/76  ? ?Most recent labs reviewed  ?Disc lifstyle change with low sodium diet and exercise  ?Will decrease hydralazine dose from 25 to 12.5 mg tid for pedal edema and f/u in 3 months  ? ?

## 2021-07-27 ENCOUNTER — Other Ambulatory Visit: Payer: Self-pay | Admitting: Family Medicine

## 2021-07-27 NOTE — Telephone Encounter (Signed)
Was sent in yesterday for #1 ok to send as requested?  ?

## 2021-07-28 ENCOUNTER — Ambulatory Visit: Payer: Medicare Other | Admitting: Allergy

## 2021-07-28 ENCOUNTER — Encounter: Payer: Self-pay | Admitting: Allergy

## 2021-07-28 VITALS — BP 114/74 | HR 74 | Temp 97.9°F | Resp 16 | Ht 66.0 in | Wt 211.0 lb

## 2021-07-28 DIAGNOSIS — T783XXD Angioneurotic edema, subsequent encounter: Secondary | ICD-10-CM | POA: Diagnosis not present

## 2021-07-28 DIAGNOSIS — T7840XD Allergy, unspecified, subsequent encounter: Secondary | ICD-10-CM

## 2021-07-28 DIAGNOSIS — R609 Edema, unspecified: Secondary | ICD-10-CM

## 2021-07-28 NOTE — Patient Instructions (Signed)
Angioedema (swelling) ?Allergic reaction ?- your work-up revealed you have alpha gal allergy (red meat allergy).  This is most likely the culprit to your allergic reactions with swelling.  ?- work-up also showed detectable IgE to dust mite, cat and dog dander ?- continue avoidance of red meat in the diet ?- have access to self-injectable epinephrine Epipen 0.3mg  at all times ?- follow emergency action plan in case of allergic reaction ?- recommend you have access to Allegra 180mg  1 tab twice a day with Pepcid 20mg  1 tab twice a day at home to use if you do have reaction symptoms.  These are antihistamine medications.   ?-do not take medications belonging to the ACEI or ARB families anymore.   ? ?Follow-up in 6 months or sooner if needed ? ?Alpha-gal and Red Meat Allergy  ? ?Overview ?An allergy to ?alpha-gal? refers to having a severe and potentially life-threatening allergy to a carbohydrate molecule called galactose-alpha-1,3-galactose that is found in most mammalian or ?red meat? Unlike other food allergies which typically occur within minutes of ingestion, symptoms from eating red meat such as pork, lamb or beef may be delayed, occurring 3-8 hours after eating. Most food allergies are directed against a protein molecule, but alpha-gal is unusual because it is a carbohydrate, and a delay in its absorption may explain the delay in symptoms. ? ?What are the symptoms of an alpha-gal allergy? ?As with other food allergies, signs or symptoms of an allergy to alpha-gal may include: ?Hives and itching  ?Swelling of your lips, face or eyelids  ?Shortness of breath, cough or wheezing  ?Abdominal pain, nausea, diarrhea or vomiting ?The most severe reaction, anaphylaxis, can present as a combination of several of these symptoms, may include low blood pressure, and is potentially fatal. ? ?Because these symptoms are delayed, you may only wake up with them in the middle of the night after an evening meal. ? ?How is an  alpha-gal allergy diagnosed? ?Diagnosis of this allergy starts with your allergist taking an appropriate history and physical examination. Because the onset is usually quite delayed, it can be hard to associate the symptoms with eating red meat many hours previously. Triggers include any red meat - including beef, pork, lamb or even horse products. It may occur after eating hotdogs and hamburgers. In very rare cases the reaction may extend to milk or dairy proteins and gelatin. ? ?Your allergist may recommend testing that includes skin tests to the relevant animal proteins and blood tests which measure the levels of a specific immunoglobulin E (IgE) antibody, to mammalian meats. An investigational blood test, IgE against alpha-gal itself, may also aid in the diagnosis. ? ?How is an alpha-gal allergy treated? ?Immediate symptoms such as hives or shortness of breath are treated the same as any other food allergy - in an urgent care setting with anti-histamines, epinephrine and other medications. Prevention long-term involves avoidance of all red meat in sensitized individuals. You may be advised to carry an epinephrine auto-injector, to be used in case of subsequent accidental exposures and reaction. These measures do not necessarily mean switching to a full vegetarian diet, since poultry and fish can be consumed and do not cause similar reactions. As with other food allergies, there is the possibility that over time the sensitivity diminishes - although these changes may take many years to become apparent. ? ?How do you become allergic to alpha-gal? ?Alpha-gal is a molecule carried in the saliva of the [place] tick and other potential arthropods typically  after feeding on mammalian blood. People that are bitten by the tick, especially those that are bitten repeatedly, are at risk of becoming sensitized and producing the IgE necessary to then cause allergic reactions. Interestingly, allergic reactions may occur to  red meat, to subsequent tick bites, and even to medications that contain alpha-gal. Cetuximab is a cancer medication that contains alpha-gal, and people who have had allergic reactions to this medication (these are typically immediate reactions, because it is infused intravenously) have a higher risk for red meat allergy and are likely to have been bitten by ticks in the past. As might be expected, the incidence of tick bites is much higher in the [place] the traditional habitat for the tick. However, cases are now increasingly reported in the [place] and [place] states. And it is a phenomenon that has been observed worldwide, with different ticks responsible for similar cases of red meat allergy in many other countries such as [place], [place] and [place]. ? ?The discovery of this peculiar allergy has allowed researchers to correlate tick bites with many cases of anaphylaxis that would previously have been classified as ?idiopathic?, or of unknown cause. Also, while it was originally thought that the [employer] tick had to feast on mammalian blood in order to carry the alpha-gal molecule, more recent research has shown that it may carry this molecule and be capable of sensitizing humans independently. ? ?How do you prevent an alpha-gal allergy? ?Because this allergy is predominantly tick born, you are more likely at risk if you often go outdoors in wooded areas for activities such as hiking, fishing or hunting. The key strategy is to prevent tick bites. This may include wearing long sleeved shirts or pants, using appropriate insect repellants, and surveying for ticks after spending time outdoors. Any observed ticks should be removed carefully by cleaning the site with rubbing alcohol, then using tweezers to pull the tick?s head up carefully from the skin using steady pressure. Clean your hands and the site one more time and make sure not to crush the tick between your fingers. ? ? ? ? ? ?

## 2021-07-28 NOTE — Progress Notes (Signed)
? ? ?Follow-up Note ? ?RE: Sharon [name] MRN: [id number] DOB: Oct 07, 1950 ?Date of Office Visit: 07/28/2021 ? ? ?History of present illness: ?Sharon [name] is a 71 y.o. female presenting today for follow-up of angioedema.  She was last seen in the office on 04/29/21 by myself.  ?She has not had any more swelling episodes like she had before that required emergency care in the ED.  She is happy about that. ?After the lab work came back showing that she had detectable IgE to alpha gal and she was advised to avoid red meat she has been doing so.  She states since she has been avoiding red meats in the diet she is even noted that the itchy rash she has been having has improved.  She has access to an epinephrine device. ?At this time she is not taking any antihistamines Allegra or Pepcid. ?She is noting more swelling of the ankles every day.  She swelling can be worse if she is on her feet more during the day.  She wakes up and notes they are swollen.  She did see her PCP about this earlier this week and was recommended to use compression socks.  ? ?Review of systems: ?Review of Systems  ?Constitutional: Negative.   ?HENT: Negative.    ?Eyes: Negative.   ?Respiratory: Negative.    ?Cardiovascular:  Positive for leg swelling (ankle).  ?Gastrointestinal: Negative.   ?Musculoskeletal: Negative.   ?Skin: Negative.   ?Allergic/Immunologic: Negative.   ?Neurological: Negative.    ? ?All other systems negative unless noted above in HPI ? ?Past medical/social/surgical/family history have been reviewed and are unchanged unless specifically indicated below. ? ?No changes ? ?Medication List: ?Current Outpatient Medications  ?Medication Sig Dispense Refill  ? albuterol (PROAIR HFA) 108 (90 Base) MCG/ACT inhaler TAKE 2 PUFFS BY MOUTH EVERY 6 HOURS AS NEEDED FOR WHEEZE OR SHORTNESS OF BREATH 8.5 each 2  ? aspirin 81 MG tablet Take 81 mg by mouth daily.    ? calcium carbonate (TUMS EX) 750 MG chewable tablet Chew 2 tablets by mouth  at bedtime.    ? Cholecalciferol (VITAMIN D) 1000 UNITS capsule Take 1,000 Units by mouth daily.    ? EPINEPHrine 0.3 mg/0.3 mL IJ SOAJ injection Inject 0.3 mg into the muscle as needed for anaphylaxis. 1 each 1  ? fluticasone (FLONASE) 50 MCG/ACT nasal spray USE 2 SPRAYS IN BOTH  NOSTRILS DAILY 48 g 0  ? hydrALAZINE (APRESOLINE) 25 MG tablet Take 0.5 tablets (12.5 mg total) by mouth every 8 (eight) hours. 135 tablet 3  ? levothyroxine (SYNTHROID) 50 MCG tablet Take 1 tablet (50 mcg total) by mouth daily before breakfast. 90 tablet 1  ? pantoprazole (PROTONIX) 40 MG tablet Take 1 tablet (40 mg total) by mouth daily. 90 tablet 1  ? sertraline (ZOLOFT) 100 MG tablet Take 1 tablet (100 mg total) by mouth daily. 90 tablet 1  ? ?No current facility-administered medications for this visit.  ?  ? ?Known medication allergies: ?Allergies  ?Allergen Reactions  ? Cozaar [Losartan] Swelling  ? Amlodipine Besylate   ?  REACTION: edema  ? Atorvastatin   ?  REACTION: muscle pain  ? Neomycin-Bacitracin Zn-Polymyx   ?  REACTION: rash ?Per pt not sure if from meds or not  ? ? ? ?Physical examination: ?Blood pressure 114/74, pulse 74, temperature 97.9 ?F (36.6 ?C), resp. rate 16, height 5\' 6"  (1.676 m), weight 211 lb (95.7 kg), SpO2 96 %. ? ?General: Alert, interactive, in  no acute distress. ?HEENT: PERRLA, TMs pearly gray, turbinates non-edematous without discharge, post-pharynx non erythematous. ?Neck: Supple without lymphadenopathy. ?Lungs: Clear to auscultation without wheezing, rhonchi or rales. {no increased work of breathing. ?CV: Normal S1, S2 without murmurs. ?Abdomen: Nondistended, nontender. ?Skin: Warm and dry, without lesions or rashes. ?Extremities:  No clubbing, cyanosis. +pitting edema over b/l lateral malleolus ?Neuro:   Grossly intact. ? ?Diagnositics/Labs: ?Labs:  ?Component ?    Latest Ref Rng 04/29/2021  ?D Pteronyssinus IgE ?    Class II kU/L 0.94 !   ?D Farinae IgE ?    Class III kU/L 1.80 !   ?Cat Dander IgE ?     Class IV kU/L 4.46 !   ?Dog Dander IgE ?    Class 0/I kU/L 0.18 !   ?[place] Grass IgE ?    Class 0 kU/L <0.10   ?Timothy Grass IgE ?    Class 0 kU/L <0.10   ?[place] Grass IgE ?    Class 0 kU/L <0.10   ?Cockroach, [place] IgE ?    Class 0 kU/L <0.10   ?Penicillium Chrysogen IgE ?    Class 0 kU/L <0.10   ?Cladosporium Herbarum IgE ?    Class 0 kU/L <0.10   ?Aspergillus Fumigatus IgE ?    Class 0 kU/L <0.10   ?Alternaria Alternata IgE ?    Class 0 kU/L <0.10   ?Maple/Box Elder IgE ?    Class 0 kU/L <0.10   ?Common Silver Charletta Cousin IgE ?    Class 0 kU/L <0.10   ?[place], [place] IgE ?    Class 0 kU/L <0.10   ?Oak, [place] IgE ?    Class 0 kU/L <0.10   ?Elm, American IgE ?    Class 0 kU/L <0.10   ?[place] IgE ?    Class 0 kU/L <0.10   ?Pecan, [place] IgE ?    Class 0 kU/L <0.10   ?White Mulberry IgE ?    Class 0 kU/L <0.10   ?Ragweed, Short IgE ?    Class 0 kU/L <0.10   ?Pigweed, Rough IgE ?    Class 0 kU/L <0.10   ?Sheep Sorrel IgE Qn ?    Class 0 kU/L <0.10   ?Mouse Urine IgE ?    Class 0 kU/L <0.10   ?Class Description Allergens Comment   ?IgE (Immunoglobulin E), Serum ?    6 - 495 IU/mL 75   ?[id number] Alpha-Gal ?    Class 0/I kU/L 0.19 !   ?Beef IgE ?    Class 0/I kU/L 0.11 !   ?Pork IgE ?    Class 0 kU/L <0.10   ?Allergen Lamb IgE ?    Class 0 kU/L <0.10   ?Complement C1Q ?    10.3 - 20.5 mg/dL 74.2 (H)   ?C1INH Functional/C1INH Total MFr SerPl ?    %mean normal 90   ?C1INH SerPl-mCnc ?    21 - 39 mg/dL 45 (H)   ?Complement C4, Serum ?    12 - 38 mg/dL 43 (H)   ?Send out lab for factor XII deficiency was normal ? ? ?Assessment and plan: ?  ?Angioedema (swelling) ?Allergic reaction ?- your work-up revealed you have alpha gal allergy (red meat allergy).  This is most likely the culprit to your allergic reactions with swelling.  ?- work-up also showed detectable IgE to dust mite, cat and dog dander ?- continue avoidance of red meat in the diet ?- have access to self-injectable  epinephrine Epipen 0.3mg  at all times ?-  follow emergency action plan in case of allergic reaction ?- recommend you have access to Allegra 180mg  1 tab twice a day with Pepcid 20mg  1 tab twice a day at home to use if you do have reaction symptoms.  These are antihistamine medications.   ?-do not take medications belonging to the ACEI or ARB families anymore.   ? ?Pitting edema ?-agree with use of compression socks ?-advised prolonged standing can worsen pitting edema ?-continue recommendations as per your PCP ? ?Follow-up in 6 months or sooner if needed ? ?I appreciate the opportunity to take part in [place]'s care. Please do not hesitate to contact me with questions. ? ?Sincerely, ? ? ?Margo AyeShaylar Ruthetta Koopmann, MD ?Allergy/Immunology ?[facility] ? ? ?

## 2021-07-29 ENCOUNTER — Ambulatory Visit: Payer: Medicare Other | Admitting: Allergy

## 2021-08-01 ENCOUNTER — Encounter: Payer: Self-pay | Admitting: Allergy

## 2021-08-23 ENCOUNTER — Other Ambulatory Visit: Payer: Self-pay | Admitting: Family Medicine

## 2021-10-03 ENCOUNTER — Encounter: Payer: Self-pay | Admitting: Family Medicine

## 2021-10-04 MED ORDER — HYDRALAZINE HCL 25 MG PO TABS: 12.5000 mg | ORAL_TABLET | Freq: Three times a day (TID) | ORAL | 0 refills | Status: DC

## 2021-11-09 ENCOUNTER — Other Ambulatory Visit: Payer: Self-pay | Admitting: Family Medicine

## 2021-11-09 NOTE — Telephone Encounter (Signed)
Last Ov  07/26/21 Last filled 09/08/21 Next ov 12/12/21

## 2021-11-17 ENCOUNTER — Ambulatory Visit (INDEPENDENT_AMBULATORY_CARE_PROVIDER_SITE_OTHER): Payer: Medicare Other | Admitting: Family Medicine

## 2021-11-17 ENCOUNTER — Encounter: Payer: Self-pay | Admitting: Family Medicine

## 2021-11-17 ENCOUNTER — Ambulatory Visit (INDEPENDENT_AMBULATORY_CARE_PROVIDER_SITE_OTHER)
Admission: RE | Admit: 2021-11-17 | Discharge: 2021-11-17 | Disposition: A | Discharge status: Home / Self Care | Payer: Medicare Other | Source: Ambulatory Visit | Attending: Family Medicine | Admitting: Family Medicine

## 2021-11-17 VITALS — BP 140/80 | HR 91 | Temp 98.6°F | Ht 66.0 in | Wt 214.4 lb

## 2021-11-17 DIAGNOSIS — M7541 Impingement syndrome of right shoulder: Secondary | ICD-10-CM

## 2021-11-17 DIAGNOSIS — M19019 Primary osteoarthritis, unspecified shoulder: Secondary | ICD-10-CM | POA: Diagnosis not present

## 2021-11-17 DIAGNOSIS — M25511 Pain in right shoulder: Secondary | ICD-10-CM

## 2021-11-17 DIAGNOSIS — I700 Atherosclerosis of aorta: Secondary | ICD-10-CM | POA: Diagnosis not present

## 2021-11-17 MED ORDER — TRIAMCINOLONE ACETONIDE 40 MG/ML IJ SUSP
40.0000 mg | Freq: Once | INTRAMUSCULAR | Status: AC
  Administered 2021-11-17: 40 mg via INTRA_ARTICULAR

## 2021-11-17 NOTE — Progress Notes (Signed)
Sharon [name] T. Judd Mccubbin, MD, CAQ Sports Medicine [facility] [address] [place], [address]  Phone: [phone]  FAX: [phone]  LAYA [name] - 71 y.o. female  MRN [id number]  Date of Birth: Dec 23, 1950  Date: 11/17/2021  PCP: Judy Pimple, MD  Referral: Judy Pimple, MD  Chief Complaint  Patient presents with   Shoulder Pain    Right   Subjective:   Sharon [name] is a 71 y.o. very pleasant female patient with Body mass index is 34.6 kg/m. who presents with the following:  Patient presents with right-sided shoulder pain ongoing now for about 9 months.  Historically she did have some shoulder pain off and on 6 or 7 years ago, and when I saw her then I did do a shoulder injection.  She felt better and has not really had too many symptoms over the years.  She did feel better when she was working out regularly, and over the course the last year she has stopped working out with her upper body.  She has pain in the plane of abduction as well as with terminal internal range of motion.  She has a painful arc of motion she also has pain on the caudal aspect of the shoulder itself in the region of the [facility] joint.  No history of prior major trauma, fracture, or dislocation.  No prior operative procedures.  Review of Systems is noted in the HPI, as appropriate  Objective:   BP 140/80   Pulse 91   Temp 98.6 F (37 C) (Oral)   Ht 5\' 6"  (1.676 m)   Wt 214 lb 6 oz (97.2 kg)   SpO2 97%   BMI 34.60 kg/m   GEN: No acute distress; alert,appropriate. PULM: Breathing comfortably in no respiratory distress PSYCH: Normally interactive.    Shoulder: R Inspection: No muscle wasting or winging Ecchymosis/edema: neg  AC joint, scapula, clavicle: NT Cervical spine: NT, full ROM Spurling's: neg Abduction: full, 5/5 Flexion: full, 5/5 IR, full, lift-off: 5/5 ER at neutral: full, 5/5 AC crossover: Positive Neer: +/- Hawkins: pos Drop  Test: neg Empty Can: pos Supraspinatus insertion: mild-mod T Bicipital groove: NT Speed's: neg Yergason's: neg Sulcus sign: neg Scapular dyskinesis: none C5-T1 intact  Neuro: Sensation intact Grip 5/5   Laboratory and Imaging Data:  Assessment and Plan:     ICD-10-CM   1. Acute pain of right shoulder  M25.511 DG Shoulder Right    triamcinolone acetonide (KENALOG-40) injection 40 mg    2. Impingement syndrome of right shoulder  M75.41     3. AC joint arthropathy  M19.019      Rotator cuff tendinopathy with inflamed AC joint.  Xrays: Shoulder series Indication: shoulder pain Findings: No evidence of occult fracture No significant glenohumeral arthritis Impingement pathology: none significant, Type 2 Acromium, with inferior osteophytosis  I really encouraged her to get back to working out in the gym, and I think if she increases the strength of her rotator cuff musculature, scapular stabilization, as well as overall upper extremity strength, that she will have less symptoms and feel better in her shoulders in general.  Intraarticular Shoulder Aspiration/Injection Procedure Note Sharon [name] 08/14/1950 Date of procedure: 11/17/2021  Procedure: Large Joint Aspiration / Injection of Shoulder, Intraarticular, R Indications: Pain  Procedure Details Verbal consent was obtained from the patient. Risks explained and contrasted with benefits and alternatives. Patient prepped with Chloraprep and Ethyl Chloride used for anesthesia. An intraarticular  shoulder injection was performed using the posterior approach; needle placed into joint capsule without difficulty. The patient tolerated the procedure well and had decreased pain post injection. No complications. Injection: 9 cc of Lidocaine 1% and 1 mL Kenalog 40 mg. Needle: 21 gauge, 2 inch   Medication Management during today's office visit: Meds ordered this encounter  Medications   triamcinolone acetonide (KENALOG-40)  injection 40 mg   There are no discontinued medications.  Orders placed today for conditions managed today: Orders Placed This Encounter  Procedures   DG Shoulder Right    Follow-up if needed: No follow-ups on file.  Dragon [employer] One speech-to-text software was used for transcription in this dictation.  Possible transcriptional errors can occur using Animal nutritionist.   Signed,  Elpidio Galea. Joseandres Mazer, MD   Outpatient Encounter Medications as of 11/17/2021  Medication Sig   albuterol (PROAIR HFA) 108 (90 Base) MCG/ACT inhaler TAKE 2 PUFFS BY MOUTH EVERY 6 HOURS AS NEEDED FOR WHEEZE OR SHORTNESS OF BREATH   aspirin 81 MG tablet Take 81 mg by mouth daily.   calcium carbonate (TUMS EX) 750 MG chewable tablet Chew 2 tablets by mouth at bedtime.   Cholecalciferol (VITAMIN D) 1000 UNITS capsule Take 1,000 Units by mouth daily.   EPINEPHrine 0.3 mg/0.3 mL IJ SOAJ injection Inject 0.3 mg into the muscle as needed for anaphylaxis.   fluticasone (FLONASE) 50 MCG/ACT nasal spray USE 2 SPRAYS IN BOTH  NOSTRILS DAILY   hydrALAZINE (APRESOLINE) 25 MG tablet Take 0.5 tablets (12.5 mg total) by mouth every 8 (eight) hours.   levothyroxine (SYNTHROID) 50 MCG tablet TAKE 1 TABLET BY MOUTH DAILY  BEFORE BREAKFAST   pantoprazole (PROTONIX) 40 MG tablet TAKE 1 TABLET BY MOUTH DAILY   sertraline (ZOLOFT) 100 MG tablet TAKE 1 TABLET BY MOUTH DAILY   [EXPIRED] triamcinolone acetonide (KENALOG-40) injection 40 mg    No facility-administered encounter medications on file as of 11/17/2021.

## 2021-11-28 ENCOUNTER — Other Ambulatory Visit: Payer: Self-pay

## 2021-11-28 ENCOUNTER — Emergency Department
Admission: EM | Admit: 2021-11-28 | Discharge: 2021-11-28 | Disposition: A | Discharge status: Home / Self Care | Payer: Medicare Other | Attending: Emergency Medicine | Admitting: Emergency Medicine

## 2021-11-28 DIAGNOSIS — I10 Essential (primary) hypertension: Secondary | ICD-10-CM | POA: Diagnosis not present

## 2021-11-28 DIAGNOSIS — K148 Other diseases of tongue: Secondary | ICD-10-CM | POA: Insufficient documentation

## 2021-11-28 DIAGNOSIS — E039 Hypothyroidism, unspecified: Secondary | ICD-10-CM | POA: Diagnosis not present

## 2021-11-28 DIAGNOSIS — R220 Localized swelling, mass and lump, head: Secondary | ICD-10-CM | POA: Diagnosis not present

## 2021-11-28 LAB — CBC WITH DIFFERENTIAL/PLATELET
Abs Immature Granulocytes: 0.04 10*3/uL (ref 0.00–0.07)
Basophils Absolute: 0.1 10*3/uL (ref 0.0–0.1)
Basophils Relative: 1 %
Eosinophils Absolute: 0.2 10*3/uL (ref 0.0–0.5)
Eosinophils Relative: 2 %
HCT: 42.2 % (ref 36.0–46.0)
Hemoglobin: 13.7 g/dL (ref 12.0–15.0)
Immature Granulocytes: 0 %
Lymphocytes Relative: 30 %
Lymphs Abs: 2.8 10*3/uL (ref 0.7–4.0)
MCH: 30.9 pg (ref 26.0–34.0)
MCHC: 32.5 g/dL (ref 30.0–36.0)
MCV: 95.3 fL (ref 80.0–100.0)
Monocytes Absolute: 0.5 10*3/uL (ref 0.1–1.0)
Monocytes Relative: 6 %
Neutro Abs: 5.6 10*3/uL (ref 1.7–7.7)
Neutrophils Relative %: 61 %
Platelets: 291 10*3/uL (ref 150–400)
RBC: 4.43 MIL/uL (ref 3.87–5.11)
RDW: 13.6 % (ref 11.5–15.5)
WBC: 9.2 10*3/uL (ref 4.0–10.5)
nRBC: 0 % (ref 0.0–0.2)

## 2021-11-28 LAB — BASIC METABOLIC PANEL
Anion gap: 8 (ref 5–15)
BUN: 19 mg/dL (ref 8–23)
CO2: 24 mmol/L (ref 22–32)
Calcium: 9.7 mg/dL (ref 8.9–10.3)
Chloride: 105 mmol/L (ref 98–111)
Creatinine, Ser: 0.96 mg/dL (ref 0.44–1.00)
GFR, Estimated: >60 mL/min (ref >60)
Glucose, Bld: 109 mg/dL — ABNORMAL HIGH (ref 70–99)
Potassium: 4.3 mmol/L (ref 3.5–5.1)
Sodium: 137 mmol/L (ref 135–145)

## 2021-11-28 MED ORDER — METHYLPREDNISOLONE SODIUM SUCC 125 MG IJ SOLR
125.0000 mg | Freq: Once | INTRAMUSCULAR | Status: AC
  Administered 2021-11-28: 125 mg via INTRAVENOUS
  Filled 2021-11-28: qty 2

## 2021-11-28 MED ORDER — DIPHENHYDRAMINE HCL 50 MG/ML IJ SOLN
50.0000 mg | Freq: Once | INTRAMUSCULAR | Status: AC
Start: 2021-11-28 — End: 2021-11-28
  Administered 2021-11-28: 50 mg via INTRAVENOUS
  Filled 2021-11-28: qty 1

## 2021-11-28 MED ORDER — FAMOTIDINE IN NACL 20-0.9 MG/50ML-% IV SOLN
20.0000 mg | Freq: Once | INTRAVENOUS | Status: AC
  Administered 2021-11-28: 20 mg via INTRAVENOUS
  Filled 2021-11-28: qty 50

## 2021-11-28 NOTE — ED Notes (Addendum)
Tongue swelling has improved but not completely resolved

## 2021-11-28 NOTE — ED Provider Notes (Signed)
[facility] Provider Note    Event Date/Time   First MD Initiated Contact with Patient 11/28/21 1925     (approximate)   History   Allergic Reaction   HPI  Sharon [name] is a 71 y.o. female past medical history of angioedema, hypertension hypothyroidism hyperlipidemia presents with tongue swelling.  Patient came to the ED around 2 PM.  She noted tongue swelling this morning.  She looked at her vitamin D Gummies and saw that they contain gelatin which is what she attributes the swelling to.  Tells me she has alpha gal syndrome.  Patient has required intubation in the past for angioedema that was presumed to be due to ARB she is no longer taking an ACE or ARB.  Patient received methylprednisolone Pepcid and Benadryl in triage.  She has been in the ED for over 5 hours feels like her symptoms have completely resolved she denies any voice changes difficulty swallowing difficulty breathing.  Patient strongly wants to go home.    Past Medical History:  Diagnosis Date   Angio-edema    Anxiety    Cataract    GERD (gastroesophageal reflux disease)    Hyperlipidemia    Hypertension    Hypothyroidism    Vitamin D deficiency     Patient Active Problem List   Diagnosis Date Noted   Pedal edema 07/26/2021   Diarrhea 04/27/2021   Angioedema 04/17/2021   Dizziness 08/23/2020   Fatigue 08/23/2020   Elevated fasting glucose 12/17/2019   Encounter for hepatitis C screening test for low risk patient 12/17/2019   Medicare annual wellness visit, subsequent 08/28/2018   Cough 01/28/2018   Welcome to Medicare preventive visit 08/22/2016   Obesity 10/05/2014   Anemia 09/24/2013   Colon cancer screening 03/03/2013   Hypokalemia 10/09/2011   Routine general medical examination at a health care facility 10/02/2011   Vitamin D deficiency disease 12/28/2010   ARTHRALGIA 08/19/2007   Hypothyroidism 01/31/2007   Hyperlipidemia 01/31/2007   Anxiety state 01/31/2007    Essential hypertension 01/31/2007   GERD 01/31/2007     Physical Exam  Triage Vital Signs: ED Triage Vitals  Enc Vitals Group     BP 11/28/21 1527 (!) 170/115     Pulse Rate 11/28/21 1527 81     Resp 11/28/21 1527 18     Temp 11/28/21 1527 98 F (36.7 C)     Temp Source 11/28/21 1527 Oral     SpO2 11/28/21 1527 98 %     Weight 11/28/21 1529 214 lb 4.6 oz (97.2 kg)     Height 11/28/21 1529 5\' 6"  (1.676 m)     Head Circumference --      Peak Flow --      Pain Score 11/28/21 1529 5     Pain Loc --      Pain Edu? --      Excl. in GC? --     Most recent vital signs: Vitals:   11/28/21 1527 11/28/21 1531  BP: (!) 170/115 (!) 170/99  Pulse: 81   Resp: 18   Temp: 98 F (36.7 C)   SpO2: 98%      General: Awake, no distress.  CV:  Good peripheral perfusion.  Resp:  Normal effort.  Abd:  No distention.  Neuro:             Awake, Alert, Oriented x 3  Other:  Tongue does not appear swollen, normal posterior oropharynx, no stridor no pooling of  secretions no respiratory distress   ED Results / Procedures / Treatments  Labs (all labs ordered are listed, but only abnormal results are displayed) Labs Reviewed  BASIC METABOLIC PANEL - Abnormal; Notable for the following components:      Result Value   Glucose, Bld 109 (*)    All other components within normal limits  CBC WITH DIFFERENTIAL/PLATELET     EKG     RADIOLOGY    PROCEDURES:  Critical Care performed: No  Procedures  The patient is on the cardiac monitor to evaluate for evidence of arrhythmia and/or significant heart rate changes.   MEDICATIONS ORDERED IN ED: Medications  methylPREDNISolone sodium succinate (SOLU-MEDROL) 125 mg/2 mL injection 125 mg (125 mg Intravenous Given 11/28/21 1550)  diphenhydrAMINE (BENADRYL) injection 50 mg (50 mg Intravenous Given 11/28/21 1550)  famotidine (PEPCID) IVPB 20 mg premix (0 mg Intravenous Stopped 11/28/21 1737)     IMPRESSION / MDM / ASSESSMENT AND PLAN / ED  COURSE  I reviewed the triage vital signs and the nursing notes.                              Patient's presentation is most consistent with acute presentation with potential threat to life or bodily function.  Differential diagnosis includes, but is not limited to, idiopathic angioedema, allergic reaction  Patient is a 71 year old female with history of angioedema that was believed to be due to ARB.  She has had several observation admissions for angioedema since.  Started with tongue swelling this morning.  Came to the ED around 2 PM.  Has been in the ED for around 6 hours by the time of my evaluation.  She was given Solu-Medrol Pepcid and Benadryl in triage and it was noted that she did have tongue swelling at that time.  On my evaluation the tongue looks normal she has no posterior oropharyngeal swelling she says her symptoms have essentially resolved.  Given she has been observed for over 6 hours and symptoms have improved I think it is reasonable for her to go home and patient strongly desires this.  Discussed immediate 911 if symptoms return.  Patient has reliable caregiver.  Think she is appropriate for discharge at this time.       FINAL CLINICAL IMPRESSION(S) / ED DIAGNOSES   Final diagnoses:  Tongue swelling     Rx / DC Orders   ED Discharge Orders     None        Note:  This document was prepared using Dragon voice recognition software and may include unintentional dictation errors.   Georga Hacking, MD 11/28/21 2020

## 2021-11-28 NOTE — ED Provider Triage Note (Signed)
  Emergency Medicine Provider Triage Evaluation Note  Sharon [name] , a 71 y.o.female,  was evaluated in triage.  Pt complains of allergic reaction after eating a gelatin which she accidentally ingested from her vitamin D Gummies.  Reports some tongue swelling around her left side.  She states that she had been intubated due to an allergic reaction this past Christmas.  She does not have any shortness of breath or chest pain at this time.  She is still able to eat/drink.   Review of Systems  Positive: Tongue swelling Negative: Denies fever, chest pain, vomiting  Physical Exam   Vitals:   11/28/21 1527 11/28/21 1531  BP: (!) 170/115 (!) 170/99  Pulse: 81   Resp: 18   Temp: 98 F (36.7 C)   SpO2: 98%    Gen:   Awake, no distress   Resp:  Normal effort  MSK:   Moves extremities without difficulty  Other:  Moderate tongue swelling, particular in the left side.  Lung sounds are clear bilaterally.  No oropharyngeal edema.  Medical Decision Making  Given the patient's initial medical screening exam, the following diagnostic evaluation has been ordered. The patient will be placed in the appropriate treatment space, once one is available, to complete the evaluation and treatment. I have discussed the plan of care with the patient and I have advised the patient that an ED physician or mid-level practitioner will reevaluate their condition after the test results have been received, as the results may give them additional insight into the type of treatment they may need.    Diagnostics: Labs  Treatments: Solu-Medrol, famotidine, Benadryl   Varney Daily, [place] 11/28/21 1541

## 2021-11-28 NOTE — ED Triage Notes (Addendum)
Pt to ED via POV from home. Pt reports allergy to gelatin and ingested some vitamin D gummies this morning. Pt reports tongue swelling and throat closing. Pt was able to drink water after reaction started. Pt able to speak and talk in complete sentences. Pt has epi pen but did not administer PTA. Pt denies SOB or CP.  Pt reports at Christmas had to be intubated due to allergic reaction.

## 2021-12-05 ENCOUNTER — Encounter: Payer: Self-pay | Admitting: Family Medicine

## 2021-12-08 NOTE — Telephone Encounter (Signed)
Sharon [name] SAID TO NOT LET PT KNOW THAT HE GAVE SYMPTOMS AND INFORMATION TO DR TOWER ABOUT HOW PT HAS BEEN DOING BECAUSE PT WOULD BE VERY UPSET WITH HER HUSBAND. Sharon [name] SAID TO SAY THAT I CALLED TO SCHEDULE APPT FOR PT AND TO CK ON PT(which is true).I spoke with pts husband (DPR signed) I started with apology that today is first day I have worked this week and I apologize they did not get a sooner cb to my chart note. Pt is asleep and Sharon [name] is not sure the day last cked BP but it was 177/105 one day this week. Pt has been taking hydralazine 25 mg taking 1/2 tab q 8 hrs. Pt has not complained of H/A, CP,SOB or new vision changes; (it is time for pt to get new glasses per pt husband). No weakness in extremities. On and off pt has been sleeping more but that is not new. 12/05/21 was the last time pts husband is aware that pt had any lightheadedness. No dizziness but pt has commented "she feels spacy sometimes." Sometimes pt has trouble with balance to the point it affects pt walking. Sharon [name] said pt does not appear in distress and scheduled appt on 12/09/21 at 11:30 appt with Dr Milinda Antis with UC & ED precautions and Sharon [name] voiced understanding. Sending note to Dr Para March and [place] CMA.

## 2021-12-09 ENCOUNTER — Encounter: Payer: Self-pay | Admitting: Family Medicine

## 2021-12-09 ENCOUNTER — Ambulatory Visit (INDEPENDENT_AMBULATORY_CARE_PROVIDER_SITE_OTHER): Payer: Medicare Other | Admitting: Family Medicine

## 2021-12-09 VITALS — BP 134/78 | HR 61 | Temp 97.8°F | Resp 16 | Ht 66.0 in | Wt 209.8 lb

## 2021-12-09 DIAGNOSIS — R32 Unspecified urinary incontinence: Secondary | ICD-10-CM | POA: Insufficient documentation

## 2021-12-09 DIAGNOSIS — T783XXD Angioneurotic edema, subsequent encounter: Secondary | ICD-10-CM | POA: Diagnosis not present

## 2021-12-09 DIAGNOSIS — R42 Dizziness and giddiness: Secondary | ICD-10-CM

## 2021-12-09 DIAGNOSIS — R002 Palpitations: Secondary | ICD-10-CM

## 2021-12-09 DIAGNOSIS — I10 Essential (primary) hypertension: Secondary | ICD-10-CM

## 2021-12-09 DIAGNOSIS — N3946 Mixed incontinence: Secondary | ICD-10-CM | POA: Diagnosis not present

## 2021-12-09 DIAGNOSIS — E039 Hypothyroidism, unspecified: Secondary | ICD-10-CM

## 2021-12-09 DIAGNOSIS — R197 Diarrhea, unspecified: Secondary | ICD-10-CM

## 2021-12-09 LAB — POC URINALSYSI DIPSTICK (AUTOMATED)
Bilirubin, UA: NEGATIVE
Blood, UA: NEGATIVE
Glucose, UA: NEGATIVE
Ketones, UA: NEGATIVE
Leukocytes, UA: NEGATIVE
Nitrite, UA: NEGATIVE
Protein, UA: NEGATIVE
Spec Grav, UA: 1.015 (ref 1.010–1.025)
Urobilinogen, UA: 0.2 E.U./dL
pH, UA: 5 (ref 5.0–8.0)

## 2021-12-09 NOTE — Patient Instructions (Addendum)
Your blood pressure is ok here -reassuring  Your cuff runs high- you may need a new cuff    Ekg today for palpitations  I will place a cardiology referral to discuss this  If you don't get a call in 1-2 weeks let us know   Urinalysis today as well

## 2021-12-09 NOTE — Assessment & Plan Note (Addendum)
Acute on chronic with c/o palpitations No longer positional , is all the time  Feels spacy as well  Nl exam today -reassuring  bp is controlled This does not date back to start of any of her medications  EKG today NSR with rate of 83  Enc to hold caffeine  Ref done to cardiology -has seen Dr Mariah Milling in past If neg w/u would consider neuro eval

## 2021-12-09 NOTE — Assessment & Plan Note (Signed)
Loose stools occ  Loses continence if urgent  Not all the time No other abd symptoms

## 2021-12-09 NOTE — Assessment & Plan Note (Signed)
bp in fair control at this time  BP Readings from Last 1 Encounters:  12/09/21 134/78   No changes needed Pt re checked bp on her meter and it was higher (151/98)  Will work on getting a new cuff  Most recent labs reviewed  Plan to continue hydralazine 25 mg 1/2 pill tid  Disc lifstyle change with low sodium diet and exercise

## 2021-12-09 NOTE — Assessment & Plan Note (Signed)
Lab Results  Component Value Date   TSH 2.19 07/26/2021   Plan to continue levothyroxine 50 mcg daily

## 2021-12-09 NOTE — Assessment & Plan Note (Signed)
New - feels per pt like irreg beat at times  Urged to check her pulse when it occurs Urged to hold caffeine  EKG NSR today  Ref to cardiology for further eval of this in setting of chronic dizziness

## 2021-12-09 NOTE — Assessment & Plan Note (Signed)
Recent ER visit 8/7  Reviewed hospital records, lab results and studies in detail  Did well after infusion of solumedrol  No further symptoms or tongue swelling Alpha gal-avoids triggers  Carries epi pen

## 2021-12-09 NOTE — Assessment & Plan Note (Signed)
Mixed incontinence  Has frequent urination at night (4 times avg) Suspect overactive bladder  ua ordered  Hesitant to px medication until chronic dizziness is eval/tx

## 2021-12-09 NOTE — Progress Notes (Signed)
Subjective:    Patient ID: Sharon [name], female    DOB: March 29, 1951, 71 y.o.   MRN: [id number]  HPI Pt presents for f/u of ER visit for tongue swelling, and also dizziness and elevated bp  Wt Readings from Last 3 Encounters:  12/09/21 209 lb 12.8 oz (95.2 kg)  11/28/21 214 lb 4.6 oz (97.2 kg)  11/17/21 214 lb 6 oz (97.2 kg)   33.86 kg/m   Was seen in ER on 8/7 Flare of angioedema - tongue swelling  Thinks this came from a vitamin D gummy that had gelatin in it (has alpha gal)  Given soluMedrol , pepcid and benadryl  Reassuring exam, monitored for 6 hours and improved clinically  Does not look like she was d/c with a steroid taper  Bp staying high ever since  She feels spacy baseline - well over a year (1-2 years)  Feels like she could pass out all day any time she is standing  Not positional except rarely when she turns over in bed - brief / does not persist   More than a year  Not spinning  Is light headed   It is worse right after she takes all her medicine in the am   Has been on current dose of zoloft  Protonix years  Levothyroxine- also not new   Started hydralazine in December  Symptoms pre dated that   Mood has fine= does not get anxious at all any more  No head injuries  Occ headache in am-does not last long and no medicines needed  Perhaps every 3 months or so   HTN bp is stable today  No cp or palpitations or headaches or edema  No side effects to medicines  BP Readings from Last 3 Encounters:  12/09/21 134/78  11/28/21 (!) 180/99  11/17/21 140/80    Re check 139/70  151/98 on her cuff    Pulse Readings from Last 3 Encounters:  12/09/21 61  11/28/21 70  11/17/21 91   At home her bp is much higher  173/120 149/104   Hydralazine 25 mg 1/2 pill tid   Balance has not been good    Urinary incontinence  Nocturia times 4 usually   Occ loose stool and has fecal incontinence    Results for orders placed or performed during the  hospital encounter of 11/28/21  CBC with Differential  Result Value Ref Range   WBC 9.2 4.0 - 10.5 K/uL   RBC 4.43 3.87 - 5.11 MIL/uL   Hemoglobin 13.7 12.0 - 15.0 g/dL   HCT 56.8 12.7 - 51.7 %   MCV 95.3 80.0 - 100.0 fL   MCH 30.9 26.0 - 34.0 pg   MCHC 32.5 30.0 - 36.0 g/dL   RDW 00.1 74.9 - 44.9 %   Platelets 291 150 - 400 K/uL   nRBC 0.0 0.0 - 0.2 %   Neutrophils Relative % 61 %   Neutro Abs 5.6 1.7 - 7.7 K/uL   Lymphocytes Relative 30 %   Lymphs Abs 2.8 0.7 - 4.0 K/uL   Monocytes Relative 6 %   Monocytes Absolute 0.5 0.1 - 1.0 K/uL   Eosinophils Relative 2 %   Eosinophils Absolute 0.2 0.0 - 0.5 K/uL   Basophils Relative 1 %   Basophils Absolute 0.1 0.0 - 0.1 K/uL   Immature Granulocytes 0 %   Abs Immature Granulocytes 0.04 0.00 - 0.07 K/uL  Basic metabolic panel  Result Value Ref Range   Sodium 137  135 - 145 mmol/L   Potassium 4.3 3.5 - 5.1 mmol/L   Chloride 105 98 - 111 mmol/L   CO2 24 22 - 32 mmol/L   Glucose, Bld 109 (H) 70 - 99 mg/dL   BUN 19 8 - 23 mg/dL   Creatinine, Ser 5.73 0.44 - 1.00 mg/dL   Calcium 9.7 8.9 - 22.0 mg/dL   GFR, Estimated >25 >42 mL/min   Anion gap 8 5 - 15     Lab Results  Component Value Date   TSH 2.19 07/26/2021   Lab Results  Component Value Date   VITAMINB12 392 04/28/2021   In January - EKG was normal  She does feel her heart pound irregularly at times   Does not take a multi vitamin   Saw Dr Mariah Milling in the past   08/2020  EKG today NSR rate of 83 without acute changes    Ua:  Results for orders placed or performed in visit on 12/09/21  POCT Urinalysis Dipstick (Automated)  Result Value Ref Range   Color, UA yellow    Clarity, UA clear    Glucose, UA Negative Negative   Bilirubin, UA Negative    Ketones, UA Negative    Spec Grav, UA 1.015 1.010 - 1.025   Blood, UA Negative    pH, UA 5.0 5.0 - 8.0   Protein, UA Negative Negative   Urobilinogen, UA 0.2 0.2 or 1.0 E.U./dL   Nitrite, UA Negative    Leukocytes, UA  Negative Negative   Clear today  Patient Active Problem List   Diagnosis Date Noted   Urinary incontinence 12/09/2021   Pedal edema 07/26/2021   Diarrhea 04/27/2021   Angioedema 04/17/2021   Dizziness 08/23/2020   Fatigue 08/23/2020   Elevated fasting glucose 12/17/2019   Encounter for hepatitis C screening test for low risk patient 12/17/2019   Medicare annual wellness visit, subsequent 08/28/2018   Cough 01/28/2018   Welcome to Medicare preventive visit 08/22/2016   Obesity 10/05/2014   Anemia 09/24/2013   Palpitations 03/03/2013   Colon cancer screening 03/03/2013   Hypokalemia 10/09/2011   Routine general medical examination at a health care facility 10/02/2011   Vitamin D deficiency disease 12/28/2010   ARTHRALGIA 08/19/2007   Hypothyroidism 01/31/2007   Hyperlipidemia 01/31/2007   Anxiety state 01/31/2007   Essential hypertension 01/31/2007   GERD 01/31/2007   Past Medical History:  Diagnosis Date   Angio-edema    Anxiety    Cataract    GERD (gastroesophageal reflux disease)    Hyperlipidemia    Hypertension    Hypothyroidism    Vitamin D deficiency    Past Surgical History:  Procedure Laterality Date   CATARACT EXTRACTION W/ INTRAOCULAR LENS IMPLANT Right 12/29/2016   Dr. Mia Creek   CATARACT EXTRACTION W/ INTRAOCULAR LENS IMPLANT Left 01/15/2017   Dr. Mia Creek   COLONOSCOPY     LEEP     TUBAL LIGATION     VEIN LIGATION AND STRIPPING     x2   Social History   Tobacco Use   Smoking status: Former    Types: Cigarettes    Quit date: 04/24/1996    Years since quitting: 25.6   Smokeless tobacco: Never  Vaping Use   Vaping Use: Never used  Substance Use Topics   Alcohol use: Yes    Alcohol/week: 0.0 standard drinks of alcohol    Comment: occ. (maybe every 3 mos.)   Drug use: No   Family History  Problem Relation  Age of Onset   Transient ischemic attack Mother    Hypertension Mother    Hypertension Father    Colon cancer Neg Hx     Esophageal cancer Neg Hx    Rectal cancer Neg Hx    Stomach cancer Neg Hx    Allergies  Allergen Reactions   Cozaar [Losartan] Swelling   Zinc Gelatin [Zinc] Anaphylaxis   Amlodipine Besylate     REACTION: edema   Atorvastatin     REACTION: muscle pain   Neomycin-Bacitracin Zn-Polymyx     REACTION: rash Per pt not sure if from meds or not   Current Outpatient Medications on File Prior to Visit  Medication Sig Dispense Refill   albuterol (PROAIR HFA) 108 (90 Base) MCG/ACT inhaler TAKE 2 PUFFS BY MOUTH EVERY 6 HOURS AS NEEDED FOR WHEEZE OR SHORTNESS OF BREATH 8.5 each 2   aspirin 81 MG tablet Take 81 mg by mouth daily.     calcium carbonate (TUMS EX) 750 MG chewable tablet Chew 2 tablets by mouth at bedtime.     Cholecalciferol (VITAMIN D) 1000 UNITS capsule Take 1,000 Units by mouth daily.     EPINEPHrine 0.3 mg/0.3 mL IJ SOAJ injection Inject 0.3 mg into the muscle as needed for anaphylaxis. 1 each 1   fluticasone (FLONASE) 50 MCG/ACT nasal spray USE 2 SPRAYS IN BOTH  NOSTRILS DAILY 48 g 1   hydrALAZINE (APRESOLINE) 25 MG tablet Take 0.5 tablets (12.5 mg total) by mouth every 8 (eight) hours. 10 tablet 0   levothyroxine (SYNTHROID) 50 MCG tablet TAKE 1 TABLET BY MOUTH DAILY  BEFORE BREAKFAST 90 tablet 3   pantoprazole (PROTONIX) 40 MG tablet TAKE 1 TABLET BY MOUTH DAILY 90 tablet 3   sertraline (ZOLOFT) 100 MG tablet TAKE 1 TABLET BY MOUTH DAILY 90 tablet 3   No current facility-administered medications on file prior to visit.    Review of Systems  Constitutional:  Positive for fatigue. Negative for activity change, appetite change, fever and unexpected weight change.  HENT:  Negative for congestion, ear pain, rhinorrhea, sinus pressure and sore throat.   Eyes:  Negative for pain, redness and visual disturbance.  Respiratory:  Negative for cough, shortness of breath and wheezing.   Cardiovascular:  Negative for chest pain and palpitations.  Gastrointestinal:  Negative for  abdominal pain, blood in stool, constipation and diarrhea.  Endocrine: Negative for polydipsia and polyuria.  Genitourinary:  Negative for dysuria, frequency and urgency.  Musculoskeletal:  Negative for arthralgias, back pain and myalgias.  Skin:  Negative for pallor and rash.  Allergic/Immunologic: Negative for environmental allergies.  Neurological:  Positive for dizziness and light-headedness. Negative for tremors, seizures, syncope, facial asymmetry, speech difficulty, weakness, numbness and headaches.  Hematological:  Negative for adenopathy. Does not bruise/bleed easily.  Psychiatric/Behavioral:  Negative for decreased concentration and dysphoric mood. The patient is not nervous/anxious.        Objective:   Physical Exam Constitutional:      General: She is not in acute distress.    Appearance: Normal appearance. She is well-developed. She is obese. She is not ill-appearing or diaphoretic.  HENT:     Head: Normocephalic and atraumatic.     Right Ear: Tympanic membrane, ear canal and external ear normal. There is no impacted cerumen.     Left Ear: Tympanic membrane, ear canal and external ear normal. There is no impacted cerumen.     Ears:     Comments: Scant cerumen     Nose:  Nose normal.     Mouth/Throat:     Mouth: Mucous membranes are moist.     Pharynx: Oropharynx is clear. No posterior oropharyngeal erythema.  Eyes:     General: No scleral icterus.       Right eye: No discharge.        Left eye: No discharge.     Conjunctiva/sclera: Conjunctivae normal.     Pupils: Pupils are equal, round, and reactive to light.  Neck:     Thyroid: No thyromegaly.     Vascular: No carotid bruit or JVD.  Cardiovascular:     Rate and Rhythm: Normal rate and regular rhythm.     Pulses: Normal pulses.     Heart sounds: Normal heart sounds.     No gallop.  Pulmonary:     Effort: Pulmonary effort is normal. No respiratory distress.     Breath sounds: Normal breath sounds. No stridor.  No wheezing, rhonchi or rales.  Abdominal:     General: There is no distension or abdominal bruit.     Palpations: Abdomen is soft.     Tenderness: There is no abdominal tenderness. There is no guarding.  Musculoskeletal:     Cervical back: Normal range of motion and neck supple. No rigidity.     Right lower leg: No edema.     Left lower leg: No edema.  Lymphadenopathy:     Cervical: No cervical adenopathy.  Skin:    General: Skin is warm and dry.     Coloration: Skin is not jaundiced or pale.     Findings: No bruising or rash.  Neurological:     Mental Status: She is alert.     Cranial Nerves: Cranial nerves 2-12 are intact. No cranial nerve deficit, dysarthria or facial asymmetry.     Sensory: Sensation is intact.     Motor: Motor function is intact. No weakness, tremor, abnormal muscle tone or pronator drift.     Coordination: Romberg sign negative. Coordination normal. Finger-Nose-Finger Test normal.     Gait: Gait is intact.     Deep Tendon Reflexes: Reflexes are normal and symmetric. Reflexes normal.     Comments: No nystagmus No dizziness with lateral gaze  Gait is normal but pt states she feels unsteady Can stand on toes briefly   Psychiatric:        Mood and Affect: Mood normal.           Assessment & Plan:   Problem List Items Addressed This Visit       Cardiovascular and Mediastinum   Essential hypertension - Primary    bp in fair control at this time  BP Readings from Last 1 Encounters:  12/09/21 134/78  No changes needed Pt re checked bp on her meter and it was higher (151/98)  Will work on getting a new cuff  Most recent labs reviewed  Plan to continue hydralazine 25 mg 1/2 pill tid  Disc lifstyle change with low sodium diet and exercise        Relevant Orders   EKG 12-Lead (Completed)     Endocrine   Hypothyroidism    Lab Results  Component Value Date   TSH 2.19 07/26/2021  Plan to continue levothyroxine 50 mcg daily         Other    Angioedema    Recent ER visit 8/7  Reviewed hospital records, lab results and studies in detail  Did well after infusion of solumedrol  No further symptoms  or tongue swelling Alpha gal-avoids triggers  Carries epi pen       Diarrhea    Loose stools occ  Loses continence if urgent  Not all the time No other abd symptoms       Dizziness    Acute on chronic with c/o palpitations No longer positional , is all the time  Feels spacy as well  Nl exam today -reassuring  bp is controlled This does not date back to start of any of her medications  EKG today NSR with rate of 83  Enc to hold caffeine  Ref done to cardiology -has seen Dr Mariah Milling in past If neg w/u would consider neuro eval       Relevant Orders   EKG 12-Lead (Completed)   Ambulatory referral to Cardiology   Palpitations    New - feels per pt like irreg beat at times  Urged to check her pulse when it occurs Urged to hold caffeine  EKG NSR today  Ref to cardiology for further eval of this in setting of chronic dizziness      Relevant Orders   Ambulatory referral to Cardiology   Urinary incontinence    Mixed incontinence  Has frequent urination at night (4 times avg) Suspect overactive bladder  ua ordered  Hesitant to px medication until chronic dizziness is eval/tx         Relevant Orders   POCT Urinalysis Dipstick (Automated) (Completed)

## 2021-12-11 ENCOUNTER — Telehealth: Payer: Self-pay | Admitting: Family Medicine

## 2021-12-11 DIAGNOSIS — I10 Essential (primary) hypertension: Secondary | ICD-10-CM

## 2021-12-11 DIAGNOSIS — R7301 Impaired fasting glucose: Secondary | ICD-10-CM

## 2021-12-11 DIAGNOSIS — E782 Mixed hyperlipidemia: Secondary | ICD-10-CM

## 2021-12-11 DIAGNOSIS — E559 Vitamin D deficiency, unspecified: Secondary | ICD-10-CM

## 2021-12-11 DIAGNOSIS — E039 Hypothyroidism, unspecified: Secondary | ICD-10-CM

## 2021-12-11 DIAGNOSIS — D649 Anemia, unspecified: Secondary | ICD-10-CM

## 2021-12-11 NOTE — Telephone Encounter (Signed)
-----   Message from Terri J Walsh sent at 12/01/2021  3:36 PM EDT ----- Regarding: Lab orders for Monday, 8.21.23 Patient is scheduled for CPX labs, please order future labs, Thanks , Terri   

## 2021-12-12 ENCOUNTER — Telehealth: Payer: Self-pay | Admitting: Family Medicine

## 2021-12-12 ENCOUNTER — Other Ambulatory Visit (INDEPENDENT_AMBULATORY_CARE_PROVIDER_SITE_OTHER): Payer: Medicare Other

## 2021-12-12 ENCOUNTER — Telehealth: Payer: Self-pay

## 2021-12-12 DIAGNOSIS — E782 Mixed hyperlipidemia: Secondary | ICD-10-CM | POA: Diagnosis not present

## 2021-12-12 DIAGNOSIS — E039 Hypothyroidism, unspecified: Secondary | ICD-10-CM

## 2021-12-12 DIAGNOSIS — N3946 Mixed incontinence: Secondary | ICD-10-CM

## 2021-12-12 DIAGNOSIS — I10 Essential (primary) hypertension: Secondary | ICD-10-CM | POA: Diagnosis not present

## 2021-12-12 DIAGNOSIS — D649 Anemia, unspecified: Secondary | ICD-10-CM | POA: Diagnosis not present

## 2021-12-12 DIAGNOSIS — E559 Vitamin D deficiency, unspecified: Secondary | ICD-10-CM | POA: Diagnosis not present

## 2021-12-12 DIAGNOSIS — R7301 Impaired fasting glucose: Secondary | ICD-10-CM | POA: Diagnosis not present

## 2021-12-12 LAB — COMPREHENSIVE METABOLIC PANEL
ALT: 7 U/L (ref 0–35)
AST: 13 U/L (ref 0–37)
Albumin: 4.1 g/dL (ref 3.5–5.2)
Alkaline Phosphatase: 76 U/L (ref 39–117)
BUN: 12 mg/dL (ref 6–23)
CO2: 26 mEq/L (ref 19–32)
Calcium: 9.3 mg/dL (ref 8.4–10.5)
Chloride: 105 mEq/L (ref 96–112)
Creatinine, Ser: 0.92 mg/dL (ref 0.40–1.20)
GFR: 62.91 mL/min (ref >60.00)
Glucose, Bld: 87 mg/dL (ref 70–99)
Potassium: 4.1 mEq/L (ref 3.5–5.1)
Sodium: 140 mEq/L (ref 135–145)
Total Bilirubin: 0.4 mg/dL (ref 0.2–1.2)
Total Protein: 6.6 g/dL (ref 6.0–8.3)

## 2021-12-12 LAB — LIPID PANEL
Cholesterol: 361 mg/dL — ABNORMAL HIGH (ref 0–200)
HDL: 54.7 mg/dL (ref >39.00)
NonHDL: 305.87
Total CHOL/HDL Ratio: 7
Triglycerides: 295 mg/dL — ABNORMAL HIGH (ref 0.0–149.0)
VLDL: 59 mg/dL — ABNORMAL HIGH (ref 0.0–40.0)

## 2021-12-12 LAB — CBC WITH DIFFERENTIAL/PLATELET
Basophils Absolute: 0 10*3/uL (ref 0.0–0.1)
Basophils Relative: 0.7 % (ref 0.0–3.0)
Eosinophils Absolute: 0.3 10*3/uL (ref 0.0–0.7)
Eosinophils Relative: 5 % (ref 0.0–5.0)
HCT: 39.5 % (ref 36.0–46.0)
Hemoglobin: 13.1 g/dL (ref 12.0–15.0)
Lymphocytes Relative: 33.5 % (ref 12.0–46.0)
Lymphs Abs: 2 10*3/uL (ref 0.7–4.0)
MCHC: 33.3 g/dL (ref 30.0–36.0)
MCV: 94.6 fl (ref 78.0–100.0)
Monocytes Absolute: 0.4 10*3/uL (ref 0.1–1.0)
Monocytes Relative: 6.6 % (ref 3.0–12.0)
Neutro Abs: 3.3 10*3/uL (ref 1.4–7.7)
Neutrophils Relative %: 54.2 % (ref 43.0–77.0)
Platelets: 240 10*3/uL (ref 150.0–400.0)
RBC: 4.17 Mil/uL (ref 3.87–5.11)
RDW: 14.3 % (ref 11.5–15.5)
WBC: 6 10*3/uL (ref 4.0–10.5)

## 2021-12-12 LAB — LDL CHOLESTEROL, DIRECT: Direct LDL: 100 mg/dL

## 2021-12-12 LAB — TSH: TSH: 2.67 u[IU]/mL (ref 0.35–5.50)

## 2021-12-12 LAB — HEMOGLOBIN A1C: Hgb A1c MFr Bld: 5.7 % (ref 4.6–6.5)

## 2021-12-12 LAB — VITAMIN D 25 HYDROXY (VIT D DEFICIENCY, FRACTURES): VITD: 24.77 ng/mL — ABNORMAL LOW (ref 30.00–100.00)

## 2021-12-12 NOTE — Telephone Encounter (Signed)
I left a message for the patient to return my call.

## 2021-12-12 NOTE — Telephone Encounter (Signed)
-----   Message from Judy Pimple, MD sent at 12/09/2021  4:02 PM EDT ----- Your urinalysis is clear (good) no evidence of infection  Would you like to see a urologist for the urinary symptoms?

## 2021-12-12 NOTE — Telephone Encounter (Signed)
-----   Message from Annabell Sabal, [place] sent at 12/12/2021  4:53 PM EDT ----- Called and spoke with pt informed her of her results. Pt is will for follow up urologist.

## 2021-12-12 NOTE — Telephone Encounter (Signed)
I put in a referral for Sharon [name]  Please alert [place] if she does not hear in 1-2 weeks

## 2021-12-13 NOTE — Telephone Encounter (Signed)
Called and lvm on pt phone regarding referral information that we have sent the referral.

## 2021-12-14 ENCOUNTER — Ambulatory Visit: Payer: Medicare Other | Admitting: Medical

## 2021-12-14 ENCOUNTER — Ambulatory Visit (INDEPENDENT_AMBULATORY_CARE_PROVIDER_SITE_OTHER): Payer: Medicare Other

## 2021-12-14 ENCOUNTER — Encounter: Payer: Self-pay | Admitting: Medical

## 2021-12-14 VITALS — BP 150/90 | HR 84 | Ht 66.0 in | Wt 214.0 lb

## 2021-12-14 DIAGNOSIS — I10 Essential (primary) hypertension: Secondary | ICD-10-CM

## 2021-12-14 DIAGNOSIS — R002 Palpitations: Secondary | ICD-10-CM

## 2021-12-14 DIAGNOSIS — R42 Dizziness and giddiness: Secondary | ICD-10-CM | POA: Diagnosis not present

## 2021-12-14 DIAGNOSIS — I059 Rheumatic mitral valve disease, unspecified: Secondary | ICD-10-CM | POA: Diagnosis not present

## 2021-12-14 DIAGNOSIS — R600 Localized edema: Secondary | ICD-10-CM

## 2021-12-14 MED ORDER — HYDROCHLOROTHIAZIDE 12.5 MG PO CAPS: 12.5000 mg | ORAL_CAPSULE | Freq: Every day | ORAL | 5 refills | Status: DC

## 2021-12-14 NOTE — Patient Instructions (Signed)
Medication Instructions:  Your physician has recommended you make the following change in your medication:   START Hydrochlorothiazide 12.5 mg daily. An Rx has been sent to your pharmacy.    *If you need a refill on your cardiac medications before your next appointment, please call your pharmacy*   Lab Work: Your physician recommends that you return for lab work (bmp) in: 2 weeks.  Please have your lab drawn at the [facility]. No appt needed. Lab hours Mon-Fri 7am-6pm. Stop at the Registration desk to check in.   If you have labs (blood work) drawn today and your tests are completely normal, you will receive your results only by: MyChart Message (if you have MyChart) OR A paper copy in the mail If you have any lab test that is abnormal or we need to change your treatment, we will call you to review the results.   Testing/Procedures: Your provider has ordered a heart monitor to wear for 14 days. This will be mailed to your home with instructions on placement. Once you have finished the time frame requested, you will return monitor in box provided.      Follow-Up: At [facility], you and your health needs are our priority.  As part of our continuing mission to provide you with exceptional heart care, we have created designated Provider Care Teams.  These Care Teams include your primary Cardiologist (physician) and Advanced Practice Providers (APPs -  Physician Assistants and Nurse Practitioners) who all work together to provide you with the care you need, when you need it.  We recommend signing up for the patient portal called "MyChart".  Sign up information is provided on this After Visit Summary.  MyChart is used to connect with patients for Virtual Visits (Telemedicine).  Patients are able to view lab/test results, encounter notes, upcoming appointments, etc.  Non-urgent messages can be sent to your provider as well.   To learn more about what you can do with MyChart, go to  [email or web address].    Your next appointment:   6-8 week(s)  The format for your next appointment:   In Person  Provider:   You may see Julien Nordmann, MD or one of the following Advanced Practice Providers on your designated Care Team:   Nicolasa Ducking, NP Eula Listen, PA-C Cadence Fransico Michael, [place]   Other Instructions N/A  Important Information About Sugar

## 2021-12-14 NOTE — Progress Notes (Signed)
Cardiology Office Note:    Date:  12/14/2021   ID:  Sharon [name], DOB 1950-08-19, MRN [id number]  PCP:  Judy Pimple, MD  [facility] Cardiologist:  None  [facility] Electrophysiologist:  None   Referring MD: Judy Pimple, MD   Chief Complaint: 1 year follow-up  History of Present Illness:    Sharon [name] is a 71 y.o. female with a hx of HTN, palpitations, alpha-gal allergy, tobacco use who presents for 1 year follow-up.   Last seen 08/2020 as a new patient referred for presyncope and MVP. Suspected symptoms from deconditioning, recommended walking program. Echo and heart monitor were discussed but not pursued.   Seh was hospitalized in December 2022 with an allergic reaction found to have the alpha-gal allergy.   Today, the patient reports high BP since earlier this month. Today, 156/90. She is on Hydralazine 12.5 TID. She also reported lower leg edema, swelling in the feet started after her hospitalization in December 2022. It generally gets better overnight. No SOB, chest pain, orthopnea or pnd. She also reports racing heart all the time. She doesn't check her heart rate. She still is having some lightheadedness and dizziness, can be worse upon standing, but not always. 150/90 re-check.   Past Medical History:  Diagnosis Date   Angio-edema    Anxiety    Cataract    GERD (gastroesophageal reflux disease)    Hyperlipidemia    Hypertension    Hypothyroidism    Vitamin D deficiency     Past Surgical History:  Procedure Laterality Date   CATARACT EXTRACTION W/ INTRAOCULAR LENS IMPLANT Right 12/29/2016   Dr. Mia Creek   CATARACT EXTRACTION W/ INTRAOCULAR LENS IMPLANT Left 01/15/2017   Dr. Mia Creek   COLONOSCOPY     LEEP     TUBAL LIGATION     VEIN LIGATION AND STRIPPING     x2    Current Medications: Current Meds  Medication Sig   albuterol (PROAIR HFA) 108 (90 Base) MCG/ACT inhaler TAKE 2 PUFFS BY MOUTH EVERY 6 HOURS AS NEEDED FOR WHEEZE  OR SHORTNESS OF BREATH   aspirin 81 MG tablet Take 81 mg by mouth daily.   calcium carbonate (TUMS EX) 750 MG chewable tablet Chew 2 tablets by mouth at bedtime.   Cholecalciferol (VITAMIN D) 1000 UNITS capsule Take 1,000 Units by mouth daily.   EPINEPHrine 0.3 mg/0.3 mL IJ SOAJ injection Inject 0.3 mg into the muscle as needed for anaphylaxis.   fluticasone (FLONASE) 50 MCG/ACT nasal spray USE 2 SPRAYS IN BOTH  NOSTRILS DAILY   hydrALAZINE (APRESOLINE) 25 MG tablet Take 0.5 tablets (12.5 mg total) by mouth every 8 (eight) hours.   hydrochlorothiazide (MICROZIDE) 12.5 MG capsule Take 1 capsule (12.5 mg total) by mouth daily.   levothyroxine (SYNTHROID) 50 MCG tablet TAKE 1 TABLET BY MOUTH DAILY  BEFORE BREAKFAST   pantoprazole (PROTONIX) 40 MG tablet TAKE 1 TABLET BY MOUTH DAILY   sertraline (ZOLOFT) 100 MG tablet TAKE 1 TABLET BY MOUTH DAILY     Allergies:   Cozaar [losartan], Zinc gelatin [zinc], Amlodipine besylate, Atorvastatin, and Neomycin-bacitracin zn-polymyx   Social History   Socioeconomic History   Marital status: Married    Spouse name: Not on file   Number of children: Not on file   Years of education: Not on file   Highest education level: Not on file  Occupational History   Not on file  Tobacco Use   Smoking status: Former  Types: Cigarettes    Quit date: 04/24/1996    Years since quitting: 25.6   Smokeless tobacco: Never  Vaping Use   Vaping Use: Never used  Substance and Sexual Activity   Alcohol use: Yes    Alcohol/week: 0.0 standard drinks of alcohol    Comment: occ. (maybe every 3 mos.)   Drug use: No   Sexual activity: Not on file  Other Topics Concern   Not on file  Social History Narrative   Not on file   Social Determinants of Health   Financial Resource Strain: Not on file  Food Insecurity: Not on file  Transportation Needs: Not on file  Physical Activity: Not on file  Stress: Not on file  Social Connections: Not on file     Family  History: The patient's family history includes Hypertension in her father and mother; Transient ischemic attack in her mother. There is no history of Colon cancer, Esophageal cancer, Rectal cancer, or Stomach cancer.  ROS:   Please see the history of present illness.     All other systems reviewed and are negative.  EKGs/Labs/Other Studies Reviewed:    The following studies were reviewed today:  N/A  EKG:  EKG is ordered today.  The ekg ordered today demonstrates NSR 84bpm, nonspecific T wave changes  Recent Labs: 04/17/2021: B Natriuretic Peptide 42.0 04/22/2021: Magnesium 2.2 12/12/2021: ALT 7; BUN 12; Creatinine, Ser 0.92; Hemoglobin 13.1; Platelets 240.0; Potassium 4.1; Sodium 140; TSH 2.67  Recent Lipid Panel    Component Value Date/Time   CHOL 361 (H) 12/12/2021 0906   TRIG 295.0 (H) 12/12/2021 0906   HDL 54.70 12/12/2021 0906   CHOLHDL 7 12/12/2021 0906   VLDL 59.0 (H) 12/12/2021 0906   LDLCALC 103 (H) 11/03/2013 0925   LDLDIRECT 100.0 12/12/2021 0906    Physical Exam:    VS:  BP (!) 150/90 (BP Location: Left Arm, Patient Position: Sitting, Cuff Size: Normal)   Pulse 84   Ht 5\' 6"  (1.676 m)   Wt 214 lb (97.1 kg)   SpO2 98%   BMI 34.54 kg/m     Wt Readings from Last 3 Encounters:  12/14/21 214 lb (97.1 kg)  12/09/21 209 lb 12.8 oz (95.2 kg)  11/28/21 214 lb 4.6 oz (97.2 kg)     GEN:  Well nourished, well developed in no acute distress HEENT: Normal NECK: No JVD; No carotid bruits LYMPHATICS: No lymphadenopathy CARDIAC: RRR, no murmurs, rubs, gallops RESPIRATORY:  Clear to auscultation without rales, wheezing or rhonchi  ABDOMEN: Soft, non-tender, non-distended MUSCULOSKELETAL:  1+ pedal edema; No deformity  SKIN: Warm and dry NEUROLOGIC:  Alert and oriented x 3 PSYCHIATRIC:  Normal affect   ASSESSMENT:    1. Lightheadedness   2. Palpitations   3. Lower extremity edema   4. Essential hypertension   5. Mitral valve disease    PLAN:    In order  of problems listed above:  Lightheadedness This is a longstanding issue, can be worse with standing. BP is elevated today. She doesn't drink enough fluids given urinary incontinence issues. EKG with NSR and heart rate of 84bpm. She reports constant palpitations, so I will check a 2 week heart monitor. She had labs recently which were unremarkable.   LLE Reports constant lower leg edema since the beginning of the year. On exam she has pedal edema. I will check an echocardiogram and start HCTZ 12.5mg  daily, BMET in 2 weeks.   HTN BP is elevated, re-check 150/90. She is  taking Hydralazine 12.5mg  TID. I will start HCTZ 12.5mg  daily as above.   MVP Echo as above. No significant murmur on exam.   Disposition: Follow up in 6-8 week(s) with MD/APP    Signed, Helga Asbury David Stall, PA-C  12/14/2021 10:43 AM    [facility]

## 2021-12-17 DIAGNOSIS — R002 Palpitations: Secondary | ICD-10-CM | POA: Diagnosis not present

## 2021-12-19 ENCOUNTER — Ambulatory Visit: Payer: Medicare Other

## 2021-12-19 ENCOUNTER — Encounter: Payer: Self-pay | Admitting: Family Medicine

## 2021-12-19 ENCOUNTER — Ambulatory Visit (INDEPENDENT_AMBULATORY_CARE_PROVIDER_SITE_OTHER): Payer: Medicare Other | Admitting: Family Medicine

## 2021-12-19 VITALS — BP 150/78 | HR 79 | Temp 97.2°F | Ht 67.0 in | Wt 211.5 lb

## 2021-12-19 DIAGNOSIS — R600 Localized edema: Secondary | ICD-10-CM

## 2021-12-19 DIAGNOSIS — E559 Vitamin D deficiency, unspecified: Secondary | ICD-10-CM | POA: Diagnosis not present

## 2021-12-19 DIAGNOSIS — E782 Mixed hyperlipidemia: Secondary | ICD-10-CM | POA: Diagnosis not present

## 2021-12-19 DIAGNOSIS — E039 Hypothyroidism, unspecified: Secondary | ICD-10-CM | POA: Diagnosis not present

## 2021-12-19 DIAGNOSIS — I10 Essential (primary) hypertension: Secondary | ICD-10-CM | POA: Diagnosis not present

## 2021-12-19 DIAGNOSIS — R7301 Impaired fasting glucose: Secondary | ICD-10-CM

## 2021-12-19 DIAGNOSIS — Z6833 Body mass index (BMI) 33.0-33.9, adult: Secondary | ICD-10-CM

## 2021-12-19 DIAGNOSIS — Z0000 Encounter for general adult medical examination without abnormal findings: Secondary | ICD-10-CM

## 2021-12-19 DIAGNOSIS — E6609 Other obesity due to excess calories: Secondary | ICD-10-CM

## 2021-12-19 DIAGNOSIS — R42 Dizziness and giddiness: Secondary | ICD-10-CM

## 2021-12-19 MED ORDER — HYDROCHLOROTHIAZIDE 25 MG PO TABS: 25.0000 mg | ORAL_TABLET | Freq: Every day | ORAL | 1 refills | Status: DC

## 2021-12-19 NOTE — Assessment & Plan Note (Signed)
Hypothyroidism  Pt has no clinical changes No change in energy level/ hair or skin/ edema and no tremor Lab Results  Component Value Date   TSH 2.67 12/12/2021    Continue levothyroxine 50 mcg daily

## 2021-12-19 NOTE — Assessment & Plan Note (Signed)
Discussed how this problem influences overall health and the risks it imposes  Reviewed plan for weight loss with lower calorie diet (via better food choices and also portion control or program like weight watchers) and exercise building up to or more than 30 minutes 5 days per week including some aerobic activity    

## 2021-12-19 NOTE — Assessment & Plan Note (Signed)
Lab Results  Component Value Date   HGBA1C 5.7 12/12/2021   disc imp of low glycemic diet and wt loss to prevent DM2

## 2021-12-19 NOTE — Assessment & Plan Note (Signed)
bp in fair control at this time  BP Readings from Last 1 Encounters:  12/19/21 (!) 150/78   No changes needed Most recent labs reviewed  Disc lifstyle change with low sodium diet and exercise  Will continue hydralazine 12.5 mg tid  Change hctz to 25 mg with f/u in a month  Lab in a week

## 2021-12-19 NOTE — Assessment & Plan Note (Signed)
Reviewed health habits including diet and exercise and skin cancer prevention Reviewed appropriate screening tests for age  Also reviewed health mt list, fam hx and immunization status , as well as social and family history   See HPI Labs reviewed  Plans flu shot in the fall  Will check on coverage of shingrix Will schedule mammogram dexa nl 2019, no falls or fx Colonoscopy utd 2015

## 2021-12-19 NOTE — Assessment & Plan Note (Signed)
Low at 24.77  Disc imp of this to bone and overall health Recommend 2000 iu daily D3

## 2021-12-19 NOTE — Assessment & Plan Note (Signed)
Light headed chronically  Reviewed cardiology visit  Wearing zio monitor Planning echo

## 2021-12-19 NOTE — Progress Notes (Signed)
Subjective:    Patient ID: Sharon [name], female    DOB: 19-Jun-1950, 71 y.o.   MRN: [id number]  HPI Here for health maintenance exam and to review chronic medical problems    Wt Readings from Last 3 Encounters:  12/19/21 211 lb 8 oz (95.9 kg)  12/14/21 214 lb (97.1 kg)  12/09/21 209 lb 12.8 oz (95.2 kg)   33.13 kg/m    Immunization History  Administered Date(s) Administered   Fluad Quad(high Dose 65+) 01/24/2019   Influenza Split 03/29/2011, 03/12/2012   Influenza,inj,Quad PF,6+ Mos 03/03/2013, 03/25/2014, 01/24/2016, 02/08/2017, 01/28/2018   PFIZER(Purple Top)SARS-COV-2 Vaccination 06/08/2019, 07/01/2019   Pneumococcal Conjugate-13 08/22/2016   Pneumococcal Polysaccharide-23 08/27/2017   Td 04/24/1998   Zoster, Live 04/10/2013   There are no preventive care reminders to display for this patient.  Flu vaccine : will do in the fall   Shingrix : interested   Tetanus shot postpned  for insurance   Mammogram 03/2021 -she will schedule  Self breast exam: no lumps or changes   Dexa  03/2018 Falls: none Fractures: none  Supplements - has not been back on it yet  D level 24.77 Exercise : needs when able   Colonoscopy 04/2013 with 10 y recall    HTN bp is stable today  No cp or palpitations or headaches or edema  No side effects to medicines  BP Readings from Last 3 Encounters:  12/19/21 (!) 150/78  12/14/21 (!) 150/90  12/09/21 134/78    Hydralazine 25 mg 1/2 pill tid  Hctz 12.5 mg daily - she is trying to squeeze out the capsule  Pt found out this has gelatin in it and cannot take it   Her pedal edema has been a bit better    Saw cardiology for light headedness Bp was elevated Noted needed more fluids  Given heart monitor and echol ordered    Lab Results  Component Value Date   CREATININE 0.92 12/12/2021   BUN 12 12/12/2021   NA 140 12/12/2021   K 4.1 12/12/2021   CL 105 12/12/2021   CO2 26 12/12/2021    Hypothyroidism  Pt has no  clinical changes No change in energy level/ hair or skin/ edema and no tremor Lab Results  Component Value Date   TSH 2.67 12/12/2021   Levothyroxine 50 mcg daily   Glucose Lab Results  Component Value Date   HGBA1C 5.7 12/12/2021    Hyperlipidemia Lab Results  Component Value Date   CHOL 361 (H) 12/12/2021   CHOL 229 (H) 12/09/2019   CHOL 193 09/04/2018   Lab Results  Component Value Date   HDL 54.70 12/12/2021   HDL 58.80 12/09/2019   HDL 56.40 09/04/2018   Lab Results  Component Value Date   LDLCALC 103 (H) 11/03/2013   LDLCALC 160 (H) 09/16/2013   LDLCALC 86 05/09/2006   Lab Results  Component Value Date   TRIG 295.0 (H) 12/12/2021   TRIG 223 (H) 04/21/2021   TRIG 210 (H) 04/18/2021   Lab Results  Component Value Date   CHOLHDL 7 12/12/2021   CHOLHDL 4 12/09/2019   CHOLHDL 3 09/04/2018   Lab Results  Component Value Date   LDLDIRECT 100.0 12/12/2021   LDLDIRECT 58.0 12/09/2019   LDLDIRECT 46.0 09/04/2018   Statin caused muscle pain  Does eat too much carb  No red meat    Father died at 54 of atherosclerosis   Patient Active Problem List   Diagnosis Date Noted  Urinary incontinence 12/09/2021   Pedal edema 07/26/2021   Diarrhea 04/27/2021   Angioedema 04/17/2021   Dizziness 08/23/2020   Fatigue 08/23/2020   Elevated fasting glucose 12/17/2019   Medicare annual wellness visit, subsequent 08/28/2018   Cough 01/28/2018   Welcome to Medicare preventive visit 08/22/2016   Obesity 10/05/2014   Anemia 09/24/2013   Palpitations 03/03/2013   Colon cancer screening 03/03/2013   Hypokalemia 10/09/2011   Routine general medical examination at a health care facility 10/02/2011   Vitamin D deficiency disease 12/28/2010   ARTHRALGIA 08/19/2007   Hypothyroidism 01/31/2007   Hyperlipidemia 01/31/2007   Anxiety state 01/31/2007   Essential hypertension 01/31/2007   GERD 01/31/2007   Past Medical History:  Diagnosis Date   Angio-edema    Anxiety     Cataract    GERD (gastroesophageal reflux disease)    Hyperlipidemia    Hypertension    Hypothyroidism    Vitamin D deficiency    Past Surgical History:  Procedure Laterality Date   CATARACT EXTRACTION W/ INTRAOCULAR LENS IMPLANT Right 12/29/2016   Dr. Mia Creek   CATARACT EXTRACTION W/ INTRAOCULAR LENS IMPLANT Left 01/15/2017   Dr. Mia Creek   COLONOSCOPY     LEEP     TUBAL LIGATION     VEIN LIGATION AND STRIPPING     x2   Social History   Tobacco Use   Smoking status: Former    Types: Cigarettes    Quit date: 04/24/1996    Years since quitting: 25.6   Smokeless tobacco: Never  Vaping Use   Vaping Use: Never used  Substance Use Topics   Alcohol use: Yes    Alcohol/week: 0.0 standard drinks of alcohol    Comment: occ. (maybe every 3 mos.)   Drug use: No   Family History  Problem Relation Age of Onset   Transient ischemic attack Mother    Hypertension Mother    Hypertension Father    Colon cancer Neg Hx    Esophageal cancer Neg Hx    Rectal cancer Neg Hx    Stomach cancer Neg Hx    Allergies  Allergen Reactions   Cozaar [Losartan] Swelling   Zinc Gelatin [Zinc] Anaphylaxis   Amlodipine Besylate     REACTION: edema   Atorvastatin     REACTION: muscle pain   Neomycin-Bacitracin Zn-Polymyx     REACTION: rash Per pt not sure if from meds or not   Current Outpatient Medications on File Prior to Visit  Medication Sig Dispense Refill   albuterol (PROAIR HFA) 108 (90 Base) MCG/ACT inhaler TAKE 2 PUFFS BY MOUTH EVERY 6 HOURS AS NEEDED FOR WHEEZE OR SHORTNESS OF BREATH 8.5 each 2   aspirin 81 MG tablet Take 81 mg by mouth daily.     calcium carbonate (TUMS EX) 750 MG chewable tablet Chew 2 tablets by mouth at bedtime.     Cholecalciferol (VITAMIN D) 1000 UNITS capsule Take 1,000 Units by mouth daily.     EPINEPHrine 0.3 mg/0.3 mL IJ SOAJ injection Inject 0.3 mg into the muscle as needed for anaphylaxis. 1 each 1   fluticasone (FLONASE) 50 MCG/ACT nasal  spray USE 2 SPRAYS IN BOTH  NOSTRILS DAILY 48 g 1   hydrALAZINE (APRESOLINE) 25 MG tablet Take 0.5 tablets (12.5 mg total) by mouth every 8 (eight) hours. 10 tablet 0   levothyroxine (SYNTHROID) 50 MCG tablet TAKE 1 TABLET BY MOUTH DAILY  BEFORE BREAKFAST 90 tablet 3   pantoprazole (PROTONIX) 40 MG tablet  TAKE 1 TABLET BY MOUTH DAILY 90 tablet 3   sertraline (ZOLOFT) 100 MG tablet TAKE 1 TABLET BY MOUTH DAILY 90 tablet 3   No current facility-administered medications on file prior to visit.    Review of Systems  Constitutional:  Positive for fatigue. Negative for activity change, appetite change, fever and unexpected weight change.  HENT:  Negative for congestion, ear pain, rhinorrhea, sinus pressure and sore throat.   Eyes:  Negative for pain, redness and visual disturbance.  Respiratory:  Negative for cough, shortness of breath and wheezing.   Cardiovascular:  Negative for chest pain and palpitations.  Gastrointestinal:  Negative for abdominal pain, blood in stool, constipation and diarrhea.  Endocrine: Negative for polydipsia and polyuria.  Genitourinary:  Negative for dysuria, frequency and urgency.  Musculoskeletal:  Positive for arthralgias. Negative for back pain and myalgias.  Skin:  Negative for pallor and rash.  Allergic/Immunologic: Negative for environmental allergies.  Neurological:  Positive for light-headedness. Negative for dizziness, syncope and headaches.  Hematological:  Negative for adenopathy. Does not bruise/bleed easily.  Psychiatric/Behavioral:  Negative for decreased concentration and dysphoric mood. The patient is not nervous/anxious.        Objective:   Physical Exam Constitutional:      General: She is not in acute distress.    Appearance: Normal appearance. She is well-developed. She is obese. She is not ill-appearing or diaphoretic.  HENT:     Head: Normocephalic and atraumatic.     Right Ear: Tympanic membrane, ear canal and external ear normal.      Left Ear: Tympanic membrane, ear canal and external ear normal.     Nose: Nose normal. No congestion.     Mouth/Throat:     Mouth: Mucous membranes are moist.     Pharynx: Oropharynx is clear. No posterior oropharyngeal erythema.  Eyes:     General: No scleral icterus.    Extraocular Movements: Extraocular movements intact.     Conjunctiva/sclera: Conjunctivae normal.     Pupils: Pupils are equal, round, and reactive to light.  Neck:     Thyroid: No thyromegaly.     Vascular: No carotid bruit or JVD.  Cardiovascular:     Rate and Rhythm: Normal rate and regular rhythm.     Pulses: Normal pulses.     Heart sounds: Normal heart sounds.     No gallop.  Pulmonary:     Effort: Pulmonary effort is normal. No respiratory distress.     Breath sounds: Normal breath sounds. No wheezing.     Comments: Good air exch Chest:     Chest wall: No tenderness.  Abdominal:     General: Bowel sounds are normal. There is no distension or abdominal bruit.     Palpations: Abdomen is soft. There is no mass.     Tenderness: There is no abdominal tenderness.     Hernia: No hernia is present.  Genitourinary:    Comments: Breast exam: No mass, nodules, thickening, tenderness, bulging, retraction, inflamation, nipple discharge or skin changes noted.  No axillary or clavicular LA.     Musculoskeletal:        General: No tenderness. Normal range of motion.     Cervical back: Normal range of motion and neck supple. No rigidity. No muscular tenderness.     Right lower leg: No edema.     Left lower leg: No edema.     Comments: No kyphosis   Lymphadenopathy:     Cervical: No cervical adenopathy.  Skin:    General: Skin is warm and dry.     Coloration: Skin is not pale.     Findings: No erythema or rash.     Comments: Solar lentigines diffusely Some sks scattered  Neurological:     Mental Status: She is alert. Mental status is at baseline.     Cranial Nerves: No cranial nerve deficit.     Motor: No  abnormal muscle tone.     Coordination: Coordination normal.     Gait: Gait normal.     Deep Tendon Reflexes: Reflexes are normal and symmetric. Reflexes normal.  Psychiatric:        Mood and Affect: Mood normal.        Cognition and Memory: Cognition and memory normal.           Assessment & Plan:   Problem List Items Addressed This Visit       Cardiovascular and Mediastinum   Essential hypertension    bp in fair control at this time  BP Readings from Last 1 Encounters:  12/19/21 (!) 150/78  No changes needed Most recent labs reviewed  Disc lifstyle change with low sodium diet and exercise  Will continue hydralazine 12.5 mg tid  Change hctz to 25 mg with f/u in a month  Lab in a week       Relevant Medications   hydrochlorothiazide (HYDRODIURIL) 25 MG tablet     Endocrine   Hypothyroidism    Hypothyroidism  Pt has no clinical changes No change in energy level/ hair or skin/ edema and no tremor Lab Results  Component Value Date   TSH 2.67 12/12/2021    Continue levothyroxine 50 mcg daily         Other   Dizziness    Light headed chronically  Reviewed cardiology visit  Wearing zio monitor Planning echo      Elevated fasting glucose    Lab Results  Component Value Date   HGBA1C 5.7 12/12/2021  disc imp of low glycemic diet and wt loss to prevent DM2       Hyperlipidemia    Disc goals for lipids and reasons to control them Rev last labs with pt Rev low sat fat diet in detail Simvastatin off med list-pt will make sure she is taking it and let us know  Trig are up- disc low glycemic diet       Relevant Medications   hydrochlorothiazide (HYDRODIURIL) 25 MG tablet   Obesity    Discussed how this problem influences overall health and the risks it imposes  Reviewed plan for weight loss with lower calorie diet (via better food choices and also portion control or program like weight watchers) and exercise building up to or more than 30 minutes 5 days  per week including some aerobic activity         Pedal edema    Ongoing  Worse with hydralazine supp garments may help  Will inc hctz to 25 mg daily and get lab in a wk F/u 1 mo      Routine general medical examination at a health care facility - Primary    Reviewed health habits including diet and exercise and skin cancer prevention Reviewed appropriate screening tests for age  Also reviewed health mt list, fam hx and immunization status , as well as social and family history   See HPI Labs reviewed  Plans flu shot in the fall  Will check on coverage of shingrix Will schedule mammogram  dexa nl 2019, no falls or fx Colonoscopy utd 2015        Vitamin D deficiency disease    Low at 24.77  Disc imp of this to bone and overall health Recommend 2000 iu daily D3

## 2021-12-19 NOTE — Assessment & Plan Note (Signed)
Disc goals for lipids and reasons to control them Rev last labs with pt Rev low sat fat diet in detail Simvastatin off med list-pt will make sure she is taking it and let us know  Trig are up- disc low glycemic diet

## 2021-12-19 NOTE — Patient Instructions (Addendum)
Get a flu shot this fall  Watch the news for newest booster for covid and recommendation    If you are interested in the new shingles vaccine (Shingrix) - call your local pharmacy to check on coverage and availability  If affordable, get on a wait list at your pharmacy to get the vaccine.  Take vitamin d at least 2000 iu daily   Go home and make sure you are still taking simvastatin 80 mg daily - let us know   Let's go up on hctz to 25 mg daily  Labs in a week Follow up in a month    Try to get most of your carbohydrates from produce (with the exception of white potatoes)  Eat less bread/pasta/rice/snack foods/cereals/sweets and other items from the middle of the grocery store (processed carbs)  Keep exercising

## 2021-12-19 NOTE — Assessment & Plan Note (Signed)
Ongoing  Worse with hydralazine supp garments may help  Will inc hctz to 25 mg daily and get lab in a wk F/u 1 mo

## 2021-12-20 ENCOUNTER — Encounter: Payer: Self-pay | Admitting: Family Medicine

## 2021-12-20 DIAGNOSIS — E782 Mixed hyperlipidemia: Secondary | ICD-10-CM

## 2021-12-22 MED ORDER — SIMVASTATIN 80 MG PO TABS: 80.0000 mg | ORAL_TABLET | Freq: Every day | ORAL | 3 refills | Status: DC

## 2021-12-22 NOTE — Assessment & Plan Note (Signed)
Back on simvastatin 80 mg now

## 2021-12-22 NOTE — Addendum Note (Signed)
Addended by: Roxy Manns A on: 12/22/2021 05:44 PM   Modules accepted: Orders

## 2021-12-27 ENCOUNTER — Telehealth: Payer: Self-pay | Admitting: Family Medicine

## 2021-12-27 ENCOUNTER — Encounter: Payer: Self-pay | Admitting: Urology

## 2021-12-27 ENCOUNTER — Ambulatory Visit: Payer: Medicare Other | Admitting: Urology

## 2021-12-27 VITALS — BP 117/110 | HR 102 | Ht 67.0 in | Wt 211.0 lb

## 2021-12-27 DIAGNOSIS — R159 Full incontinence of feces: Secondary | ICD-10-CM | POA: Diagnosis not present

## 2021-12-27 DIAGNOSIS — N3946 Mixed incontinence: Secondary | ICD-10-CM

## 2021-12-27 DIAGNOSIS — I10 Essential (primary) hypertension: Secondary | ICD-10-CM

## 2021-12-27 LAB — URINALYSIS, COMPLETE
Bilirubin, UA: NEGATIVE
Glucose, UA: NEGATIVE
Ketones, UA: NEGATIVE
Leukocytes,UA: NEGATIVE
Nitrite, UA: NEGATIVE
Protein,UA: NEGATIVE
RBC, UA: NEGATIVE
Specific Gravity, UA: 1.025 (ref 1.005–1.030)
Urobilinogen, Ur: 0.2 mg/dL (ref 0.2–1.0)
pH, UA: 5 (ref 5.0–7.5)

## 2021-12-27 LAB — MICROSCOPIC EXAMINATION: Bacteria, UA: NONE SEEN

## 2021-12-27 LAB — BLADDER SCAN AMB NON-IMAGING: Scan Result: 1

## 2021-12-27 MED ORDER — OXYBUTYNIN CHLORIDE ER 10 MG PO TB24: 10.0000 mg | ORAL_TABLET | Freq: Every day | ORAL | 0 refills | Status: DC

## 2021-12-27 NOTE — Telephone Encounter (Signed)
-----   Message from Alvina Chou sent at 12/20/2021 11:49 AM EDT ----- Regarding: Lab orders for Wednesday, 9.6.23 Lab orders for BMP?

## 2021-12-27 NOTE — Progress Notes (Signed)
12/27/2021 2:51 PM   Sharon [name] [birth year] [id number]  Referring provider: Judy Pimple, MD [address] [place],  [place] [address]  Chief Complaint  Patient presents with   Urinary Incontinence    HPI: 71 year old female who presents today for further evaluation of urinary symptoms.  She has a personal history of pedal edema.  She is on hydrochlorothiazide which was recently increased.  Her primary complaint today is urinary frequency, 4 times at night.  She reports that sometimes when she rolls over in bed, she will have the urge to urinate and just cannot get there on time.  Complicating factors there has been leave the toilet seat open.  She has an accident about every other day.  She does limit the amount of beverages she drinks that nighttime in particular.  She also has issue in the daytime but is worse at night.  This been going on for about a year but got worse following an admission in January for angioedema.  She is also been struggling with fecal incontinence which started around the same time or may be slightly after.  She denies any dysuria or gross hematuria.  She does still have a uterus.  She does have some mild stress incontinence, laughing with sneezing or sleeping which is not severe and has been going on for much longer.  This bothers her less.  She drinks primarily water.  Urinalysis was unremarkable.  In the past, she been hesitant to proceed with medication due to chronic dizziness.   PMH: Past Medical History:  Diagnosis Date   Angio-edema    Anxiety    Cataract    GERD (gastroesophageal reflux disease)    Hyperlipidemia    Hypertension    Hypothyroidism    Vitamin D deficiency     Surgical History: Past Surgical History:  Procedure Laterality Date   CATARACT EXTRACTION W/ INTRAOCULAR LENS IMPLANT Right 12/29/2016   Dr. Mia Creek   CATARACT EXTRACTION W/ INTRAOCULAR LENS IMPLANT Left 01/15/2017   Dr. Mia Creek    COLONOSCOPY     LEEP     TUBAL LIGATION     VEIN LIGATION AND STRIPPING     x2    Home Medications:  Allergies as of 12/27/2021       Reactions   Cozaar [losartan] Swelling   Zinc Gelatin [zinc] Anaphylaxis   Amlodipine Besylate    REACTION: edema   Atorvastatin    REACTION: muscle pain   Neomycin-bacitracin Zn-polymyx    REACTION: rash Per pt not sure if from meds or not        Medication List        Accurate as of December 27, 2021  2:51 PM. If you have any questions, ask your nurse or doctor.          albuterol 108 (90 Base) MCG/ACT inhaler Commonly known as: ProAir HFA TAKE 2 PUFFS BY MOUTH EVERY 6 HOURS AS NEEDED FOR WHEEZE OR SHORTNESS OF BREATH   aspirin 81 MG tablet Take 81 mg by mouth daily.   calcium carbonate 750 MG chewable tablet Commonly known as: TUMS EX Chew 2 tablets by mouth at bedtime.   EPINEPHrine 0.3 mg/0.3 mL Soaj injection Commonly known as: EPI-PEN Inject 0.3 mg into the muscle as needed for anaphylaxis.   fluticasone 50 MCG/ACT nasal spray Commonly known as: FLONASE USE 2 SPRAYS IN BOTH  NOSTRILS DAILY   hydrALAZINE 25 MG tablet Commonly known as: APRESOLINE Take 0.5 tablets (  12.5 mg total) by mouth every 8 (eight) hours.   hydrochlorothiazide 25 MG tablet Commonly known as: HYDRODIURIL Take 1 tablet (25 mg total) by mouth daily.   levothyroxine 50 MCG tablet Commonly known as: SYNTHROID TAKE 1 TABLET BY MOUTH DAILY  BEFORE BREAKFAST   pantoprazole 40 MG tablet Commonly known as: PROTONIX TAKE 1 TABLET BY MOUTH DAILY   sertraline 100 MG tablet Commonly known as: ZOLOFT TAKE 1 TABLET BY MOUTH DAILY   simvastatin 80 MG tablet Commonly known as: Zocor Take 1 tablet (80 mg total) by mouth daily.   Vitamin D 1000 units capsule Take 1,000 Units by mouth daily.        Allergies:  Allergies  Allergen Reactions   Cozaar [Losartan] Swelling   Zinc Gelatin [Zinc] Anaphylaxis   Amlodipine Besylate     REACTION:  edema   Atorvastatin     REACTION: muscle pain   Neomycin-Bacitracin Zn-Polymyx     REACTION: rash Per pt not sure if from meds or not    Family History: Family History  Problem Relation Age of Onset   Transient ischemic attack Mother    Hypertension Mother    Hypertension Father    Colon cancer Neg Hx    Esophageal cancer Neg Hx    Rectal cancer Neg Hx    Stomach cancer Neg Hx     Social History:  reports that she quit smoking about 25 years ago. Her smoking use included cigarettes. She has never used smokeless tobacco. She reports current alcohol use. She reports that she does not use drugs.   Physical Exam: BP (!) 117/110   Pulse (!) 102   Ht 5\' 7"  (1.702 m)   Wt 211 lb (95.7 kg)   BMI 33.05 kg/m   Constitutional:  Alert and oriented, No acute distress. HEENT:  AT, moist mucus membranes.  Trachea midline, no masses. Cardiovascular: No clubbing, cyanosis, or edema. Respiratory: Normal respiratory effort, no increased work of breathing. Neurologic: Grossly intact, no focal deficits, moving all 4 extremities. Psychiatric: Normal mood and affect.  Laboratory Data: Lab Results  Component Value Date   WBC 6.0 12/12/2021   HGB 13.1 12/12/2021   HCT 39.5 12/12/2021   MCV 94.6 12/12/2021   PLT 240.0 12/12/2021    Lab Results  Component Value Date   CREATININE 0.92 12/12/2021    Lab Results  Component Value Date   HGBA1C 5.7 12/12/2021    Urinalysis UA today is negative   Pertinent Imaging: Results for orders placed or performed in visit on 12/27/21  Bladder Scan (Post Void Residual) in office  Result Value Ref Range   Scan Result 1     Assessment & Plan:    1. Mixed stress and urge urinary incontinence Primarily bothered by urgency and urge incontinence along with nocturia  We discussed behavioral modification including avoidance of beverages 4 hours before bedtime as well as beverage choice which does not seem to be a contributing factor.  We  discussed the pathophysiology of urgency and urge incontinence.  We discussed the algorithm.  We discussed the 2 types of medications including anticholinergics and beta 3 agonist.  We discussed second and third line options.  We will start with oxybutynin 10 mg XL.  Plan to reassess urinary symptoms in about 6 weeks with PVR. - Urinalysis, Complete - Bladder Scan (Post Void Residual) in office  2. Incontinence of feces, unspecified fecal incontinence type Anticholinergic may help somewhat with fecal incontinence, if not would encourage  her to mention this to your primary care and consider GI referral.  Follow-up 6 weeks for urinary assessment/  Vanna Scotland, MD  [facility] [address], [address] [place], [place] [address] [phone]

## 2021-12-28 ENCOUNTER — Other Ambulatory Visit (INDEPENDENT_AMBULATORY_CARE_PROVIDER_SITE_OTHER): Payer: Medicare Other

## 2021-12-28 DIAGNOSIS — E782 Mixed hyperlipidemia: Secondary | ICD-10-CM | POA: Diagnosis not present

## 2021-12-28 DIAGNOSIS — I10 Essential (primary) hypertension: Secondary | ICD-10-CM

## 2021-12-28 LAB — LIPID PANEL
Cholesterol: 342 mg/dL — ABNORMAL HIGH (ref 0–200)
HDL: 57.7 mg/dL (ref >39.00)
NonHDL: 283.92
Total CHOL/HDL Ratio: 6
Triglycerides: 299 mg/dL — ABNORMAL HIGH (ref 0.0–149.0)
VLDL: 59.8 mg/dL — ABNORMAL HIGH (ref 0.0–40.0)

## 2021-12-28 LAB — BASIC METABOLIC PANEL
BUN: 15 mg/dL (ref 6–23)
CO2: 26 mEq/L (ref 19–32)
Calcium: 9.7 mg/dL (ref 8.4–10.5)
Chloride: 101 mEq/L (ref 96–112)
Creatinine, Ser: 0.95 mg/dL (ref 0.40–1.20)
GFR: 60.51 mL/min (ref >60.00)
Glucose, Bld: 91 mg/dL (ref 70–99)
Potassium: 3.9 mEq/L (ref 3.5–5.1)
Sodium: 137 mEq/L (ref 135–145)

## 2021-12-28 LAB — AST: AST: 16 U/L (ref 0–37)

## 2021-12-28 LAB — LDL CHOLESTEROL, DIRECT: Direct LDL: 111 mg/dL

## 2021-12-28 LAB — ALT: ALT: 7 U/L (ref 0–35)

## 2021-12-30 ENCOUNTER — Encounter: Payer: Self-pay | Admitting: *Deleted

## 2022-01-07 DIAGNOSIS — R002 Palpitations: Secondary | ICD-10-CM | POA: Diagnosis not present

## 2022-01-12 ENCOUNTER — Ambulatory Visit: Payer: Medicare Other | Attending: Medical

## 2022-01-12 DIAGNOSIS — R600 Localized edema: Secondary | ICD-10-CM | POA: Diagnosis not present

## 2022-01-12 LAB — ECHOCARDIOGRAM COMPLETE
AR max vel: 2.39 cm2
AV Area VTI: 2.56 cm2
AV Area mean vel: 2.3 cm2
AV Mean grad: 2 mmHg
AV Peak grad: 4.3 mmHg
Ao pk vel: 1.04 m/s
Area-P 1/2: 4.17 cm2
S' Lateral: 2.6 cm
Single Plane A4C EF: 74.3 %

## 2022-01-15 ENCOUNTER — Other Ambulatory Visit: Payer: Self-pay

## 2022-01-15 ENCOUNTER — Encounter: Payer: Self-pay | Admitting: Emergency Medicine

## 2022-01-15 ENCOUNTER — Emergency Department
Admission: EM | Admit: 2022-01-15 | Discharge: 2022-01-15 | Disposition: A | Discharge status: Home / Self Care | Payer: Medicare Other | Attending: Emergency Medicine | Admitting: Emergency Medicine

## 2022-01-15 DIAGNOSIS — E039 Hypothyroidism, unspecified: Secondary | ICD-10-CM | POA: Diagnosis not present

## 2022-01-15 DIAGNOSIS — I10 Essential (primary) hypertension: Secondary | ICD-10-CM | POA: Insufficient documentation

## 2022-01-15 DIAGNOSIS — T783XXA Angioneurotic edema, initial encounter: Secondary | ICD-10-CM | POA: Diagnosis not present

## 2022-01-15 LAB — CBC WITH DIFFERENTIAL/PLATELET
Abs Immature Granulocytes: 0.04 10*3/uL (ref 0.00–0.07)
Basophils Absolute: 0 10*3/uL (ref 0.0–0.1)
Basophils Relative: 0 %
Eosinophils Absolute: 0.3 10*3/uL (ref 0.0–0.5)
Eosinophils Relative: 4 %
HCT: 39 % (ref 36.0–46.0)
Hemoglobin: 12.8 g/dL (ref 12.0–15.0)
Immature Granulocytes: 1 %
Lymphocytes Relative: 31 %
Lymphs Abs: 2.2 10*3/uL (ref 0.7–4.0)
MCH: 31.1 pg (ref 26.0–34.0)
MCHC: 32.8 g/dL (ref 30.0–36.0)
MCV: 94.7 fL (ref 80.0–100.0)
Monocytes Absolute: 0.5 10*3/uL (ref 0.1–1.0)
Monocytes Relative: 7 %
Neutro Abs: 4 10*3/uL (ref 1.7–7.7)
Neutrophils Relative %: 57 %
Platelets: 229 10*3/uL (ref 150–400)
RBC: 4.12 MIL/uL (ref 3.87–5.11)
RDW: 13.6 % (ref 11.5–15.5)
WBC: 7 10*3/uL (ref 4.0–10.5)
nRBC: 0 % (ref 0.0–0.2)

## 2022-01-15 LAB — BASIC METABOLIC PANEL
Anion gap: 8 (ref 5–15)
BUN: 15 mg/dL (ref 8–23)
CO2: 28 mmol/L (ref 22–32)
Calcium: 9.6 mg/dL (ref 8.9–10.3)
Chloride: 103 mmol/L (ref 98–111)
Creatinine, Ser: 0.9 mg/dL (ref 0.44–1.00)
GFR, Estimated: >60 mL/min (ref >60)
Glucose, Bld: 104 mg/dL — ABNORMAL HIGH (ref 70–99)
Potassium: 3.7 mmol/L (ref 3.5–5.1)
Sodium: 139 mmol/L (ref 135–145)

## 2022-01-15 MED ORDER — PREDNISONE 10 MG (21) PO TBPK: ORAL_TABLET | ORAL | 0 refills | Status: DC

## 2022-01-15 MED ORDER — EPINEPHRINE 0.3 MG/0.3ML IJ SOAJ
0.3000 mg | Freq: Once | INTRAMUSCULAR | Status: AC
  Administered 2022-01-15: 0.3 mg via INTRAMUSCULAR
  Filled 2022-01-15: qty 0.3

## 2022-01-15 MED ORDER — METHYLPREDNISOLONE SODIUM SUCC 125 MG IJ SOLR
125.0000 mg | Freq: Once | INTRAMUSCULAR | Status: AC
  Administered 2022-01-15: 125 mg via INTRAVENOUS
  Filled 2022-01-15: qty 2

## 2022-01-15 MED ORDER — FAMOTIDINE IN NACL 20-0.9 MG/50ML-% IV SOLN
20.0000 mg | Freq: Once | INTRAVENOUS | Status: AC
  Administered 2022-01-15: 20 mg via INTRAVENOUS
  Filled 2022-01-15: qty 50

## 2022-01-15 NOTE — ED Provider Notes (Signed)
[facility] Provider Note    Event Date/Time   First MD Initiated Contact with Patient 01/15/22 1330     (approximate)   History   Oral Swelling   HPI  Sharon [name] is a 71 y.o. female with a history of angioedema, hypertension, hypothyroidism, and hyperlipidemia who presents with tongue swelling, acute onset about 2 hours ago and persisting since then.  It is mainly on the right side of the tongue.  The patient denies shortness of breath although she feels there is some tightness in her throat and difficulty fully exhaling.  It feels similar to several prior episodes of angioedema she has had.  She took 2 Benadryl with minimal relief.  The patient states that around 530 this morning she took some Tums although Tums has not triggered this in the past.  I reviewed the past medical records.  The patient was most recently seen in the ED with a similar presentation on 8/7 and was treated successfully with Solu-Medrol, Pepcid, and Benadryl.  She was seen for it in February as well and also successfully treated and observed in the ED.  Previously she was admitted in January and per the hospitalist discharge summary from 1/5 she was mainly just observed during that admission but in December she had an episode in which she required intubation.     Physical Exam   Triage Vital Signs: ED Triage Vitals  Enc Vitals Group     BP 01/15/22 1329 (!) 185/98     Pulse Rate 01/15/22 1329 87     Resp 01/15/22 1329 20     Temp 01/15/22 1329 98.5 F (36.9 C)     Temp Source 01/15/22 1329 Oral     SpO2 01/15/22 1329 94 %     Weight 01/15/22 1327 211 lb 10.3 oz (96 kg)     Height 01/15/22 1327 5\' 7"  (1.702 m)     Head Circumference --      Peak Flow --      Pain Score 01/15/22 1327 0     Pain Loc --      Pain Edu? --      Excl. in [place]? --     Most recent vital signs: Vitals:   01/15/22 1400 01/15/22 1430  BP: (!) 149/81 (!) 144/65  Pulse: 81 95  Resp:  18   Temp:    SpO2: 95% 93%     General: Awake, no distress.  CV:  Good peripheral perfusion.  Resp:  Normal effort.  Abd:  No distention.  Other:  Tongue appears swollen especially on the right side.  Posterior oropharynx and uvula with no visible swelling.  Slightly muted voice but no pooled secretions or stridor.   ED Results / Procedures / Treatments   Labs (all labs ordered are listed, but only abnormal results are displayed) Labs Reviewed  BASIC METABOLIC PANEL - Abnormal; Notable for the following components:      Result Value   Glucose, Bld 104 (*)    All other components within normal limits  CBC WITH DIFFERENTIAL/PLATELET     EKG     RADIOLOGY     PROCEDURES:  Critical Care performed: No  Procedures   MEDICATIONS ORDERED IN ED: Medications  EPINEPHrine (EPI-PEN) injection 0.3 mg (0.3 mg Intramuscular Given 01/15/22 1351)  methylPREDNISolone sodium succinate (SOLU-MEDROL) 125 mg/2 mL injection 125 mg (125 mg Intravenous Given 01/15/22 1349)  famotidine (PEPCID) IVPB 20 mg premix (0 mg Intravenous Stopped 01/15/22  1420)     IMPRESSION / MDM / ASSESSMENT AND PLAN / ED COURSE  I reviewed the triage vital signs and the nursing notes.    Differential diagnosis includes, but is not limited to, hereditary or idiopathic angioedema, allergic reaction.  At this time, the swelling is limited to the tongue and appears similar to her last ED presentation last month.  The posterior oropharynx and airway appear intact.  The patient has responded well to steroids, antihistamines, and epinephrine previously.  I have ordered an EpiPen as well as IV Solu-Medrol and Pepcid.  The patient took 50 mg of Benadryl already.  She is on the monitor.  At this time there is no indication for emergent ENT consultation or for intubation.  We will observe for at least 4 hours and consider prolonged observation if her symptoms do not rapidly improve.  Patient's presentation is most  consistent with acute presentation with potential threat to life or bodily function.  The patient is on the cardiac monitor to evaluate for evidence of arrhythmia and/or significant heart rate changes.  ----------------------------------------- 4:02 PM on 01/15/2022 -----------------------------------------  Patient reports slightly improved swelling and no worsening since she arrived.  Basic labs are unremarkable including normal electrolytes and no leukocytosis on the CBC.  We will continue to monitor for the next several hours.  I have signed her out to the oncoming ED physician Dr. Derrill Kay.   FINAL CLINICAL IMPRESSION(S) / ED DIAGNOSES   Final diagnoses:  Angioedema, initial encounter     Rx / DC Orders   ED Discharge Orders     None        Note:  This document was prepared using Dragon voice recognition software and may include unintentional dictation errors.    Dionne Bucy, MD 01/15/22 [id number]

## 2022-01-15 NOTE — ED Triage Notes (Signed)
Pt reports woke up this am and her tongue was swollen and it has progressively gotten worse. Pt reports took 2 benadryl. Pt reports this has happened in the past as well several times

## 2022-01-15 NOTE — Discharge Instructions (Addendum)
Please seek medical attention for any high fevers, chest pain, shortness of breath, change in behavior, persistent vomiting, bloody stool or any other new or concerning symptoms.  

## 2022-01-15 NOTE — ED Provider Notes (Signed)
Patient's swelling improved. Patient felt comfortable with discharge. Prescribed steroids for the next few days. Patient states she will follow up with her allergy specialist.    Nance Pear, MD 01/15/22 2107

## 2022-01-17 ENCOUNTER — Telehealth: Payer: Self-pay

## 2022-01-17 NOTE — Progress Notes (Signed)
Results released to mychart

## 2022-01-17 NOTE — Telephone Encounter (Signed)
Called to give the pt monitor results. Lmtcb.

## 2022-01-17 NOTE — Telephone Encounter (Signed)
-----   Message from [place], PA-C sent at 01/17/2022 10:04 AM EDT ----- Heart monitor showed normal rhythm with 82 runs of SVT and rare ectopy. We will likely have to change medication at follow-up to help control fast heart rhythms. Can we also refer to EP for SVT.

## 2022-01-18 ENCOUNTER — Telehealth: Payer: Self-pay

## 2022-01-18 NOTE — Progress Notes (Signed)
Chronic Care Management Pharmacy Assistant   Name: Sharon [name]  MRN: [id number] DOB: 01/26/1951  Reason for Encounter: Non-CCM (Hosptial Follow Up)  Medications: Outpatient Encounter Medications as of 01/18/2022  Medication Sig   albuterol (PROAIR HFA) 108 (90 Base) MCG/ACT inhaler TAKE 2 PUFFS BY MOUTH EVERY 6 HOURS AS NEEDED FOR WHEEZE OR SHORTNESS OF BREATH   aspirin 81 MG tablet Take 81 mg by mouth daily.   calcium carbonate (TUMS EX) 750 MG chewable tablet Chew 2 tablets by mouth at bedtime.   Cholecalciferol (VITAMIN D) 1000 UNITS capsule Take 1,000 Units by mouth daily.   EPINEPHrine 0.3 mg/0.3 mL IJ SOAJ injection Inject 0.3 mg into the muscle as needed for anaphylaxis.   fluticasone (FLONASE) 50 MCG/ACT nasal spray USE 2 SPRAYS IN BOTH  NOSTRILS DAILY   hydrALAZINE (APRESOLINE) 25 MG tablet Take 0.5 tablets (12.5 mg total) by mouth every 8 (eight) hours.   hydrochlorothiazide (HYDRODIURIL) 25 MG tablet Take 1 tablet (25 mg total) by mouth daily.   levothyroxine (SYNTHROID) 50 MCG tablet TAKE 1 TABLET BY MOUTH DAILY  BEFORE BREAKFAST   oxybutynin (DITROPAN-XL) 10 MG 24 hr tablet Take 1 tablet (10 mg total) by mouth daily.   pantoprazole (PROTONIX) 40 MG tablet TAKE 1 TABLET BY MOUTH DAILY   predniSONE (STERAPRED UNI-PAK 21 TAB) 10 MG (21) TBPK tablet Per packaging instructions   sertraline (ZOLOFT) 100 MG tablet TAKE 1 TABLET BY MOUTH DAILY   simvastatin (ZOCOR) 80 MG tablet Take 1 tablet (80 mg total) by mouth daily.   No facility-administered encounter medications on file as of 01/18/2022.   Reviewed hospital notes for details of recent visit. Has patient been contacted by Transitions of Care team? No Has patient seen PCP/specialist for hospital follow up (summarize OV if yes): No  Admitted to the ED on 01/15/2022. Discharge date was 01/15/2022.  Discharged from [facility].   Discharge diagnosis (Principal Problem): Angioedema Patient was discharged  to Home  Brief summary of hospital course: Sharon [name] is a 71 y.o. female with a history of angioedema, hypertension, hypothyroidism, and hyperlipidemia who presents with tongue swelling, acute onset about 2 hours ago and persisting since then.  It is mainly on the right side of the tongue.  The patient denies shortness of breath although she feels there is some tightness in her throat and difficulty fully exhaling.  It feels similar to several prior episodes of angioedema she has had.  She took 2 Benadryl with minimal relief.  The patient states that around 530 this morning she took some Tums although Tums has not triggered this in the past. At this time, the swelling is limited to the tongue and appears similar to her last ED presentation last month.  The posterior oropharynx and airway appear intact.  The patient has responded well to steroids, antihistamines, and epinephrine previously.  I have ordered an EpiPen as well as IV Solu-Medrol and Pepcid.  The patient took 50 mg of Benadryl already.  She is on the monitor.  At this time there is no indication for emergent ENT consultation or for intubation.  We will observe for at least 4 hours and consider prolonged observation if her symptoms do not rapidly improve. Patient reports slightly improved swelling and no worsening since she arrived.  Basic labs are unremarkable including normal electrolytes and no leukocytosis on the CBC.  Patient's presentation is most consistent with acute presentation with potential threat to life or bodily function.  The patient is on the cardiac monitor to evaluate for evidence of arrhythmia and/or significant heart rate changes. Patient's swelling improved. Patient felt comfortable with discharge. Prescribed steroids for the next few days. Patient states she will follow up with her allergy specialist.    New?Medications Started at [facility] Discharge:?? -Started predniSONE (STERAPRED UNI-PAK 21 TAB) 10 MG (21) TBPK tablet    Medication Changes at Hospital Discharge: None  Medications Discontinued at Hospital Discharge: None  Medications that remain the same after Hospital Discharge:??  -All other medications will remain the same.    Next CCM appt: Non-CCM  Other upcoming appts: PCP appointment on 01/19/2022 Allergy appointment on 01/26/2022 Cardiology appointment on 01/31/2022 Urology appointment on 02/07/2022  Charlene Brooke, CPP notified  Marijean Niemann, [place] Pharmacy Assistant [phone]  Charlene Brooke, PharmD notified and will determine if action is needed.

## 2022-01-19 ENCOUNTER — Ambulatory Visit (INDEPENDENT_AMBULATORY_CARE_PROVIDER_SITE_OTHER): Payer: Medicare Other | Admitting: Family Medicine

## 2022-01-19 ENCOUNTER — Encounter: Payer: Self-pay | Admitting: Family Medicine

## 2022-01-19 VITALS — BP 134/82 | HR 96 | Temp 97.3°F | Ht 67.0 in | Wt 211.0 lb

## 2022-01-19 DIAGNOSIS — E782 Mixed hyperlipidemia: Secondary | ICD-10-CM | POA: Diagnosis not present

## 2022-01-19 DIAGNOSIS — T783XXD Angioneurotic edema, subsequent encounter: Secondary | ICD-10-CM

## 2022-01-19 MED ORDER — ROSUVASTATIN CALCIUM 20 MG PO TABS: 20.0000 mg | ORAL_TABLET | Freq: Every day | ORAL | 3 refills | Status: DC

## 2022-01-19 NOTE — Progress Notes (Signed)
Subjective:    Patient ID: Sharon [name], female    DOB: 09-19-1950, 71 y.o.   MRN: [id number]  HPI Pt presents for f/u of ER visit for angioedema  Wt Readings from Last 3 Encounters:  01/19/22 211 lb (95.7 kg)  01/15/22 211 lb 10.3 oz (96 kg)  12/27/21 211 lb (95.7 kg)   33.05 kg/m  Seen on 9/24 for swelling of tongue (right side)  No sob but did feel tight in throat  Has had this before  Took 2 benadryl and went to the ER   Noted had had tums that day- but this has not triggered symptoms in the past   Tx with epi pen injection  Solu medrol 125 mg  Pepcid 20 mg IV   Improved in ER /obs for over 4 hours    Going to Dr Delorse Lek since she was in ER last time  Is interested in a new referral  Last allergy testing right after first hosp   All to cats and dogs (has none) Dust mites  Alpha gal   Has to avoid gelatin  Alpha gal- avoids meat   Had hives one time this year- ? Why  A little nasal allergy  Uses flonasein  No antihistamines    HTN bp is stable today  No cp or palpitations or headaches or edema  No side effects to medicines  BP Readings from Last 3 Encounters:  01/19/22 134/82  01/15/22 (!) 131/101  12/27/21 (!) 117/110     Cholesterol  Lab Results  Component Value Date   CHOL 342 (H) 12/28/2021   CHOL 361 (H) 12/12/2021   CHOL 229 (H) 12/09/2019   Lab Results  Component Value Date   HDL 57.70 12/28/2021   HDL 54.70 12/12/2021   HDL 58.80 12/09/2019   Lab Results  Component Value Date   LDLCALC 103 (H) 11/03/2013   LDLCALC 160 (H) 09/16/2013   LDLCALC 86 05/09/2006   Lab Results  Component Value Date   TRIG 299.0 (H) 12/28/2021   TRIG 295.0 (H) 12/12/2021   TRIG 223 (H) 04/21/2021   Lab Results  Component Value Date   CHOLHDL 6 12/28/2021   CHOLHDL 7 12/12/2021   CHOLHDL 4 12/09/2019   Lab Results  Component Value Date   LDLDIRECT 111.0 12/28/2021   LDLDIRECT 100.0 12/12/2021   LDLDIRECT 58.0 12/09/2019   Eating well   Sticks to lower fat cheese  Cutting carbs  No greasy pork   Is back on simvastatin   Occ fried foods   Lab Results  Component Value Date   CREATININE 0.90 01/15/2022   BUN 15 01/15/2022   NA 139 01/15/2022   K 3.7 01/15/2022   CL 103 01/15/2022   CO2 28 01/15/2022   Lab Results  Component Value Date   ALT 7 12/28/2021   AST 16 12/28/2021   ALKPHOS 76 12/12/2021   BILITOT 0.4 12/12/2021    Patient Active Problem List   Diagnosis Date Noted   Urinary incontinence 12/09/2021   Pedal edema 07/26/2021   Diarrhea 04/27/2021   Angioedema 04/17/2021   Dizziness 08/23/2020   Fatigue 08/23/2020   Elevated fasting glucose 12/17/2019   Medicare annual wellness visit, subsequent 08/28/2018   Cough 01/28/2018   Welcome to Medicare preventive visit 08/22/2016   Obesity 10/05/2014   Anemia 09/24/2013   Palpitations 03/03/2013   Colon cancer screening 03/03/2013   Hypokalemia 10/09/2011   Routine general medical examination at a health care facility 10/02/2011  Vitamin D deficiency disease 12/28/2010   ARTHRALGIA 08/19/2007   Hypothyroidism 01/31/2007   Hyperlipidemia 01/31/2007   Anxiety state 01/31/2007   Essential hypertension 01/31/2007   GERD 01/31/2007   Past Medical History:  Diagnosis Date   Angio-edema    Anxiety    Cataract    GERD (gastroesophageal reflux disease)    Hyperlipidemia    Hypertension    Hypothyroidism    Vitamin D deficiency    Past Surgical History:  Procedure Laterality Date   CATARACT EXTRACTION W/ INTRAOCULAR LENS IMPLANT Right 12/29/2016   Dr. Mia Creek   CATARACT EXTRACTION W/ INTRAOCULAR LENS IMPLANT Left 01/15/2017   Dr. Mia Creek   COLONOSCOPY     LEEP     TUBAL LIGATION     VEIN LIGATION AND STRIPPING     x2   Social History   Tobacco Use   Smoking status: Former    Types: Cigarettes    Quit date: 04/24/1996    Years since quitting: 25.7   Smokeless tobacco: Never  Vaping Use   Vaping Use: Never used   Substance Use Topics   Alcohol use: Yes    Alcohol/week: 0.0 standard drinks of alcohol    Comment: occ. (maybe every 3 mos.)   Drug use: No   Family History  Problem Relation Age of Onset   Transient ischemic attack Mother    Hypertension Mother    Hypertension Father    Colon cancer Neg Hx    Esophageal cancer Neg Hx    Rectal cancer Neg Hx    Stomach cancer Neg Hx    Allergies  Allergen Reactions   Cozaar [Losartan] Swelling   Zinc Gelatin [Zinc] Anaphylaxis   Amlodipine Besylate     REACTION: edema   Atorvastatin     REACTION: muscle pain   Neomycin-Bacitracin Zn-Polymyx     REACTION: rash Per pt not sure if from meds or not   Current Outpatient Medications on File Prior to Visit  Medication Sig Dispense Refill   albuterol (PROAIR HFA) 108 (90 Base) MCG/ACT inhaler TAKE 2 PUFFS BY MOUTH EVERY 6 HOURS AS NEEDED FOR WHEEZE OR SHORTNESS OF BREATH 8.5 each 2   aspirin 81 MG tablet Take 81 mg by mouth daily.     calcium carbonate (TUMS EX) 750 MG chewable tablet Chew 2 tablets by mouth at bedtime.     Cholecalciferol (VITAMIN D) 1000 UNITS capsule Take 1,000 Units by mouth daily.     EPINEPHrine 0.3 mg/0.3 mL IJ SOAJ injection Inject 0.3 mg into the muscle as needed for anaphylaxis. 1 each 1   fluticasone (FLONASE) 50 MCG/ACT nasal spray USE 2 SPRAYS IN BOTH  NOSTRILS DAILY 48 g 1   hydrALAZINE (APRESOLINE) 25 MG tablet Take 0.5 tablets (12.5 mg total) by mouth every 8 (eight) hours. 10 tablet 0   hydrochlorothiazide (HYDRODIURIL) 25 MG tablet Take 1 tablet (25 mg total) by mouth daily. 90 tablet 1   levothyroxine (SYNTHROID) 50 MCG tablet TAKE 1 TABLET BY MOUTH DAILY  BEFORE BREAKFAST 90 tablet 3   oxybutynin (DITROPAN-XL) 10 MG 24 hr tablet Take 1 tablet (10 mg total) by mouth daily. 60 tablet 0   pantoprazole (PROTONIX) 40 MG tablet TAKE 1 TABLET BY MOUTH DAILY 90 tablet 3   predniSONE (STERAPRED UNI-PAK 21 TAB) 10 MG (21) TBPK tablet Per packaging instructions 21 tablet  0   sertraline (ZOLOFT) 100 MG tablet TAKE 1 TABLET BY MOUTH DAILY 90 tablet 3   No  current facility-administered medications on file prior to visit.     Review of Systems  Constitutional:  Positive for fatigue. Negative for activity change, appetite change, fever and unexpected weight change.  HENT:  Positive for postnasal drip and rhinorrhea. Negative for congestion, ear pain, mouth sores, sinus pressure, sore throat, trouble swallowing and voice change.   Eyes:  Negative for pain, redness and visual disturbance.  Respiratory:  Negative for cough, shortness of breath and wheezing.   Cardiovascular:  Negative for chest pain and palpitations.  Gastrointestinal:  Negative for abdominal pain, blood in stool, constipation and diarrhea.  Endocrine: Negative for polydipsia and polyuria.  Genitourinary:  Negative for dysuria, frequency and urgency.  Musculoskeletal:  Negative for arthralgias, back pain and myalgias.  Skin:  Negative for pallor and rash.       Hives in the past   Allergic/Immunologic: Negative for environmental allergies.  Neurological:  Negative for dizziness, syncope and headaches.  Hematological:  Negative for adenopathy. Does not bruise/bleed easily.  Psychiatric/Behavioral:  Negative for decreased concentration and dysphoric mood. The patient is not nervous/anxious.        Objective:   Physical Exam Constitutional:      General: She is not in acute distress.    Appearance: Normal appearance. She is well-developed. She is obese. She is not ill-appearing or diaphoretic.  HENT:     Head: Normocephalic and atraumatic.     Right Ear: Tympanic membrane, ear canal and external ear normal.     Left Ear: Tympanic membrane, ear canal and external ear normal.     Nose:     Comments: Boggy nares     Mouth/Throat:     Mouth: Mucous membranes are moist.     Pharynx: Oropharynx is clear. No oropharyngeal exudate or posterior oropharyngeal erythema.     Comments: No swelling of  mouth or tongue or throat  Eyes:     Conjunctiva/sclera: Conjunctivae normal.     Pupils: Pupils are equal, round, and reactive to light.  Neck:     Thyroid: No thyromegaly.     Vascular: No carotid bruit or JVD.  Cardiovascular:     Rate and Rhythm: Normal rate and regular rhythm.     Heart sounds: Normal heart sounds.     No gallop.  Pulmonary:     Effort: Pulmonary effort is normal. No respiratory distress.     Breath sounds: Normal breath sounds. No stridor. No wheezing, rhonchi or rales.     Comments: No forced exp w/o wheeze or stridor  Chest:     Chest wall: No tenderness.  Abdominal:     General: There is no distension or abdominal bruit.     Palpations: Abdomen is soft.     Tenderness: There is no abdominal tenderness. There is no guarding.  Musculoskeletal:     Cervical back: Normal range of motion and neck supple.     Right lower leg: No edema.     Left lower leg: No edema.  Lymphadenopathy:     Cervical: No cervical adenopathy.  Skin:    General: Skin is warm and dry.     Coloration: Skin is not pale.     Findings: No rash.  Neurological:     Mental Status: She is alert.     Cranial Nerves: No cranial nerve deficit.     Coordination: Coordination normal.     Deep Tendon Reflexes: Reflexes are normal and symmetric. Reflexes normal.  Psychiatric:  Mood and Affect: Mood normal.           Assessment & Plan:   Problem List Items Addressed This Visit       Other   Angioedema - Primary    This is recurrent in the setting of alpha gal allergy  ER visit noted Reviewed hospital records, lab results and studies in detail  Reassuring quick imp with epi inj, solu medrol and IV pepcid  Now finishing prednisone Disc last allergist visit, she prefers new practice Reviewed notes and tests from Dr Nelva Bush today Ref done  H/o occ urticaria (unrel to above) and some nasal allergies as well  Disc use of flonase and otc antihist prn  Nl exam today       Hyperlipidemia    Disc goals for lipids and reasons to control them Rev last labs with pt Rev low sat fat diet in detail  Simvastatin 80 mg  LDL 103  Disc goal of LDL under 100  Willing to try crestor (in past lipitor caused muscle pain) Disc poss side eff crestor 20 mg px  Will update  Already avoids animal fats due to alpha gal  Will plan cholesterol labs in approx 6 wk       Relevant Medications   rosuvastatin (CRESTOR) 20 MG tablet

## 2022-01-19 NOTE — Assessment & Plan Note (Signed)
Disc goals for lipids and reasons to control them Rev last labs with pt Rev low sat fat diet in detail  Simvastatin 80 mg  LDL 103  Disc goal of LDL under 100  Willing to try crestor (in past lipitor caused muscle pain) Disc poss side eff crestor 20 mg px  Will update  Already avoids animal fats due to alpha gal  Will plan cholesterol labs in approx 6 wk

## 2022-01-19 NOTE — Assessment & Plan Note (Addendum)
This is recurrent in the setting of alpha gal allergy  ER visit noted Reviewed hospital records, lab results and studies in detail  Reassuring quick imp with epi inj, solu medrol and IV pepcid  Now finishing prednisone Disc last allergist visit, she prefers new practice Reviewed notes and tests from Dr Nelva Bush today Ref done  H/o occ urticaria (unrel to above) and some nasal allergies as well  Disc use of flonase and otc antihist prn  Nl exam today

## 2022-01-19 NOTE — Patient Instructions (Addendum)
I will place a referral to new allergy clinic  It you don't hear in 1-2 weeks let us know   In the meantime keep using flonase If you have hives or nasal symptoms- try zyrtec 10 mg daily in the evening   Keep your epi pen on hand   Finish the prednisone   Lets try crestor 20 instead of simvastatin to see if it works better and less side effects  But if you have side effects stop it and let us know

## 2022-01-25 ENCOUNTER — Emergency Department
Admission: EM | Admit: 2022-01-25 | Discharge: 2022-01-25 | Disposition: A | Discharge status: Home / Self Care | Payer: Medicare Other | Attending: Emergency Medicine | Admitting: Emergency Medicine

## 2022-01-25 ENCOUNTER — Other Ambulatory Visit: Payer: Self-pay

## 2022-01-25 ENCOUNTER — Encounter: Payer: Self-pay | Admitting: Intensive Care

## 2022-01-25 DIAGNOSIS — T783XXA Angioneurotic edema, initial encounter: Secondary | ICD-10-CM | POA: Diagnosis not present

## 2022-01-25 DIAGNOSIS — R220 Localized swelling, mass and lump, head: Secondary | ICD-10-CM | POA: Diagnosis present

## 2022-01-25 MED ORDER — DIPHENHYDRAMINE HCL 50 MG/ML IJ SOLN: 50.0000 mg | Freq: Once | INTRAMUSCULAR | Status: AC

## 2022-01-25 MED ORDER — FAMOTIDINE IN NACL 20-0.9 MG/50ML-% IV SOLN
20.0000 mg | Freq: Once | INTRAVENOUS | Status: AC
  Administered 2022-01-25: 20 mg via INTRAVENOUS
  Filled 2022-01-25: qty 50

## 2022-01-25 MED ORDER — EPINEPHRINE 0.3 MG/0.3ML IJ SOAJ
0.3000 mg | Freq: Once | INTRAMUSCULAR | Status: AC
  Administered 2022-01-25: 0.3 mg via INTRAMUSCULAR
  Filled 2022-01-25: qty 0.3

## 2022-01-25 MED ORDER — METHYLPREDNISOLONE SODIUM SUCC 125 MG IJ SOLR: 125.0000 mg | Freq: Once | INTRAMUSCULAR | Status: AC

## 2022-01-25 MED ORDER — DIPHENHYDRAMINE HCL 50 MG/ML IJ SOLN
INTRAMUSCULAR | Status: AC
  Administered 2022-01-25: 50 mg via INTRAVENOUS
  Filled 2022-01-25: qty 1

## 2022-01-25 MED ORDER — METHYLPREDNISOLONE SODIUM SUCC 125 MG IJ SOLR
INTRAMUSCULAR | Status: AC
  Administered 2022-01-25: 125 mg via INTRAVENOUS
  Filled 2022-01-25: qty 2

## 2022-01-25 NOTE — ED Triage Notes (Addendum)
Patient presents with tongue swelling. Voice muffled and slurred due to swelling. Hard to swallow. Seen for same 01/15/22. Another allergist appointment tomorrow.

## 2022-01-25 NOTE — ED Notes (Signed)
Pt able to tolerate food at this time with no issues swallowing. D/C and reasons to return discussed with pt and husband, both verbalized understanding. NAD noted. Pt ambulatory with steady gait on d/C.

## 2022-01-25 NOTE — ED Notes (Signed)
Pt presents to [facility] with c/o of angioedema involving tongue swelling and some SOB.   Pt's tongue is severely enlarged to make back of throat almost not visible. Pt does have some issues with speaking due to swelling of tongue, pt also endorses SOB and some drooling.  Pt states intubated in December of 2022 due to this same issue. Pt states she is feeling the same way as she did during that time. Pt states last night she went to bed normal with no swelling or issues, pt state she woke up this morning with issues.   IV placed, MD at bedside.

## 2022-01-25 NOTE — ED Provider Notes (Signed)
[facility] Provider Note    None    (approximate)   History   Angioedema   HPI  Sharon [name] is a 71 y.o. female with past medical history significant for recurrent episodes of angioedema who presents to the emergency department with tongue swelling.  Patient states earlier this morning she started having tongue swelling.  Similar to prior episodes of tongue swelling which she is followed by an allergist for.  They believe that it is secondary to an alpha gal allergy.  She is no longer on her ACE inhibitor or ARB.  Denies any shortness of breath.  Denies nausea or vomiting.  Does have some new changes to home medications.     Physical Exam   Triage Vital Signs: ED Triage Vitals  Enc Vitals Group     BP 01/25/22 0849 (!) 162/88     Pulse Rate 01/25/22 0849 93     Resp 01/25/22 0849 18     Temp 01/25/22 0849 98 F (36.7 C)     Temp Source 01/25/22 0849 Oral     SpO2 01/25/22 0849 95 %     Weight 01/25/22 0850 211 lb (95.7 kg)     Height 01/25/22 0850 5\' 6"  (1.676 m)     Head Circumference --      Peak Flow --      Pain Score 01/25/22 0850 0     Pain Loc --      Pain Edu? --      Excl. in [place]? --     Most recent vital signs: Vitals:   01/25/22 1200 01/25/22 1300  BP: (!) 120/56 (!) 123/58  Pulse: 82 83  Resp: 14 15  Temp:    SpO2: 96% 97%   Physical Exam   General: Awake, sitting up in bed, husband at bedside CV:  Good peripheral perfusion.  Resp:  Lungs clear to auscultation Abd:  No distention.  Other:  Significant tongue swelling with fullness under the tongue worse on the left side.  No posterior oropharyngeal edema.  No stridor.  Muffled voice. Skin:     No erythema or warmth.  No urticaria noted.   ED Results / Procedures / Treatments   Labs (all labs ordered are listed, but only abnormal results are displayed) Labs Reviewed - No data to display   EKG     RADIOLOGY     PROCEDURES:  Critical Care performed:  Yes, see critical care procedure note(s)  Procedures CRITICAL CARE Performed by: Nathaniel Man   Total critical care time: 35 minutes  Critical care time was exclusive of separately billable procedures and treating other patients.  Critical care was necessary to treat or prevent imminent or life-threatening deterioration.  Critical care was time spent personally by me on the following activities: development of treatment plan with patient and/or surrogate as well as nursing, discussions with consultants, evaluation of patient's response to treatment, examination of patient, obtaining history from patient or surrogate, ordering and performing treatments and interventions, ordering and review of laboratory studies, ordering and review of radiographic studies, pulse oximetry and re-evaluation of patient's condition.   MEDICATIONS ORDERED IN ED: Medications  diphenhydrAMINE (BENADRYL) injection 50 mg (50 mg Intravenous Given 01/25/22 0909)  EPINEPHrine (EPI-PEN) injection 0.3 mg (0.3 mg Intramuscular Given 01/25/22 0912)  methylPREDNISolone sodium succinate (SOLU-MEDROL) 125 mg/2 mL injection 125 mg (125 mg Intravenous Given 01/25/22 0909)  famotidine (PEPCID) IVPB 20 mg premix (0 mg Intravenous Stopped 01/25/22 0953)  IMPRESSION / MDM / ASSESSMENT AND PLAN / ED COURSE  I reviewed the triage vital signs and the nursing notes.   Differential diagnosis includes, but is not limited to, hereditary angioedema, anaphylaxis, allergic reaction, Ludwigs angina  Reviewed outpatient visit with allergist, bleed secondary to an alpha gal allergy.  Reviewed home medications and no longer on ACE inhibitors or ARB's.  On chart review patient has had multiple ED visits in the past with angioedema that has resolved with treatment of epinephrine, Solu-Medrol and Benadryl.  Patient was given epinephrine, Solu-Medrol, Benadryl and Pepcid.  Will monitor for 4 hours for improvement.  No wheezing on lung exam at  this time do not feel that she needs albuterol.  No dental involvement so of a lower suspicion for Ludwig's given her history.  2:25 PM   Patient continues to have angioedema of her tongue.  Does have some slight improvement  Patient's presentation is most consistent with acute presentation with potential threat to life or bodily function.   Clinical Course as of 01/25/22 1425  Wed Jan 25, 2022  1422 On reevaluation significant improvement of her tongue swelling.  States that she is feeling much better.  Able to tolerate p.o. and is eating a sandwich.  Discussed close follow-up with her allergist.  Given return precautions for any recurrent symptoms. [SM]    Clinical Course User Index [SM] Corena Herter, MD     FINAL CLINICAL IMPRESSION(S) / ED DIAGNOSES   Final diagnoses:  Angioedema, initial encounter     Rx / DC Orders   ED Discharge Orders     None        Note:  This document was prepared using Dragon voice recognition software and may include unintentional dictation errors.   Corena Herter, MD 01/25/22 1425

## 2022-01-25 NOTE — ED Notes (Signed)
Pt up OOB to bedside toilet, pt's tongue does appear to have a significant decrease in swelling as well as pt's voice seems to be less slurred than on arrival.

## 2022-01-26 ENCOUNTER — Ambulatory Visit: Payer: Medicare Other | Admitting: Allergy

## 2022-01-31 ENCOUNTER — Encounter: Payer: Self-pay | Admitting: Medical

## 2022-01-31 ENCOUNTER — Ambulatory Visit: Payer: Medicare Other | Attending: Medical | Admitting: Medical

## 2022-01-31 VITALS — BP 140/90 | HR 76 | Ht 66.0 in | Wt 212.0 lb

## 2022-01-31 DIAGNOSIS — I10 Essential (primary) hypertension: Secondary | ICD-10-CM | POA: Diagnosis not present

## 2022-01-31 DIAGNOSIS — I5032 Chronic diastolic (congestive) heart failure: Secondary | ICD-10-CM | POA: Diagnosis not present

## 2022-01-31 DIAGNOSIS — I471 Supraventricular tachycardia, unspecified: Secondary | ICD-10-CM

## 2022-01-31 DIAGNOSIS — R002 Palpitations: Secondary | ICD-10-CM | POA: Diagnosis not present

## 2022-01-31 MED ORDER — METOPROLOL TARTRATE 25 MG PO TABS: 25.0000 mg | ORAL_TABLET | Freq: Two times a day (BID) | ORAL | 3 refills | Status: DC

## 2022-01-31 NOTE — Patient Instructions (Signed)
Medication Instructions:  Your physician has recommended you make the following change in your medication:  START: Metoprolol Lopressor 25mg  twice daily  *If you need a refill on your cardiac medications before your next appointment, please call your pharmacy*   Lab Work: NONE ordered at this time of appointment   If you have labs (blood work) drawn today and your tests are completely normal, you will receive your results only by: [facility] (if you have MyChart) OR A paper copy in the mail If you have any lab test that is abnormal or we need to change your treatment, we will call you to review the results.   Testing/Procedures: NONE ordered at this time of appointment     Follow-Up: At [facility], you and your health needs are our priority.  As part of our continuing mission to provide you with exceptional heart care, we have created designated Provider Care Teams.  These Care Teams include your primary Cardiologist (physician) and Advanced Practice Providers (APPs -  Physician Assistants and Nurse Practitioners) who all work together to provide you with the care you need, when you need it.  We recommend signing up for the patient portal called "MyChart".  Sign up information is provided on this After Visit Summary.  MyChart is used to connect with patients for Virtual Visits (Telemedicine).  Patients are able to view lab/test results, encounter notes, upcoming appointments, etc.  Non-urgent messages can be sent to your provider as well.   To learn more about what you can do with MyChart, go to [email or web address].    Your next appointment:   3 month(s)  The format for your next appointment:   In Person  Provider:   You will see one of the following Advanced Practice Providers on your designated Care Team:   Murray Hodgkins, NP Christell Faith, PA-C Cadence Kathlen Mody, PA-C Gerrie Nordmann, NP  Important Information About Sugar

## 2022-01-31 NOTE — Progress Notes (Signed)
Cardiology Office Note:    Date:  01/31/2022   ID:  Sharon [name], DOB 10-04-1950, MRN [id number]  PCP:  Abner Greenspan, MD  [facility] Cardiologist:  None  [facility] Electrophysiologist:  None   Referring MD: Abner Greenspan, MD   Chief Complaint: angioedema  History of Present Illness:    Sharon [name] is a 71 y.o. female with a hx of HTN, palpitations, alpha-gal allergy, and tobacco use who presents for 1 year follow-up.    She was seen 08/2020 as a new patient referred for presyncope and MVP. Suspected symptoms from deconditioning, recommended walking program. Echo and heart monitor were discussed but not pursued.    She was hospitalized in December 2022 with an allergic reaction found to have the alpha-gal allergy.   Last seen 12/14/2021 and blood pressure was mildly elevated.  She also reported lower leg edema.  Echocardiogram was ordered and she was started on hydrochlorothiazide.  Also reported palpitations, a 2-week heart monitor was ordered.  Echo showed LVEF 60 to [id number], grade 1 diastolic dysfunction, mild calcification of the aortic valve.  Heart monitor showed predominantly normal rhythm, 82 episodes of SVT, longest lasting 19 beats, otherwise rare ectopy.  Patient was referred to EP for SVT.  Since last visit patient was seen in the ER for allergic reaction twice.  Today, echo and heart monitor were reviewed. She denies chest pain or shortness of breath. She has palpitations, worse at night. No lower leg edema, orthopnea or ond.    Past Medical History:  Diagnosis Date   Angio-edema    Anxiety    Cataract    GERD (gastroesophageal reflux disease)    Hyperlipidemia    Hypertension    Hypothyroidism    Vitamin D deficiency     Past Surgical History:  Procedure Laterality Date   CATARACT EXTRACTION W/ INTRAOCULAR LENS IMPLANT Right 12/29/2016   Dr. Darleen Crocker   CATARACT EXTRACTION W/ INTRAOCULAR LENS IMPLANT Left 01/15/2017   Dr. Darleen Crocker    COLONOSCOPY     LEEP     TUBAL LIGATION     VEIN LIGATION AND STRIPPING     x2    Current Medications: Current Meds  Medication Sig   albuterol (PROAIR HFA) 108 (90 Base) MCG/ACT inhaler TAKE 2 PUFFS BY MOUTH EVERY 6 HOURS AS NEEDED FOR WHEEZE OR SHORTNESS OF BREATH   aspirin 81 MG tablet Take 81 mg by mouth daily.   calcium carbonate (TUMS EX) 750 MG chewable tablet Chew 2 tablets by mouth at bedtime.   Cholecalciferol (VITAMIN D) 1000 UNITS capsule Take 1,000 Units by mouth daily.   EPINEPHrine 0.3 mg/0.3 mL IJ SOAJ injection Inject 0.3 mg into the muscle as needed for anaphylaxis.   fluticasone (FLONASE) 50 MCG/ACT nasal spray USE 2 SPRAYS IN BOTH  NOSTRILS DAILY   hydrALAZINE (APRESOLINE) 25 MG tablet Take 0.5 tablets (12.5 mg total) by mouth every 8 (eight) hours.   hydrochlorothiazide (HYDRODIURIL) 25 MG tablet Take 1 tablet (25 mg total) by mouth daily.   levothyroxine (SYNTHROID) 50 MCG tablet TAKE 1 TABLET BY MOUTH DAILY  BEFORE BREAKFAST   metoprolol tartrate (LOPRESSOR) 25 MG tablet Take 1 tablet (25 mg total) by mouth 2 (two) times daily.   oxybutynin (DITROPAN-XL) 10 MG 24 hr tablet Take 1 tablet (10 mg total) by mouth daily.   pantoprazole (PROTONIX) 40 MG tablet TAKE 1 TABLET BY MOUTH DAILY   rosuvastatin (CRESTOR) 20 MG tablet Take 1 tablet (  20 mg total) by mouth daily.   sertraline (ZOLOFT) 100 MG tablet TAKE 1 TABLET BY MOUTH DAILY     Allergies:   Cozaar [losartan], Zinc gelatin [zinc], Alpha-gal, Amlodipine besylate, Atorvastatin, Beef allergy, Cat hair extract, Dust mite extract, and Neomycin-bacitracin zn-polymyx   Social History   Socioeconomic History   Marital status: Married    Spouse name: Not on file   Number of children: Not on file   Years of education: Not on file   Highest education level: Not on file  Occupational History   Not on file  Tobacco Use   Smoking status: Former    Types: Cigarettes    Quit date: 04/24/1996    Years since  quitting: 25.7   Smokeless tobacco: Never  Vaping Use   Vaping Use: Never used  Substance and Sexual Activity   Alcohol use: Yes    Alcohol/week: 4.0 standard drinks of alcohol    Types: 4 Shots of liquor per week    Comment: occasional   Drug use: No   Sexual activity: Not on file  Other Topics Concern   Not on file  Social History Narrative   Not on file   Social Determinants of Health   Financial Resource Strain: Not on file  Food Insecurity: Not on file  Transportation Needs: Not on file  Physical Activity: Not on file  Stress: Not on file  Social Connections: Not on file     Family History: The patient's family history includes Hypertension in her father and mother; Transient ischemic attack in her mother. There is no history of Colon cancer, Esophageal cancer, Rectal cancer, or Stomach cancer.  ROS:   Please see the history of present illness.     All other systems reviewed and are negative.  EKGs/Labs/Other Studies Reviewed:    The following studies were reviewed today:  Heart monitor 12/2021 Event monitor Patch Wear Time:  14 days and 0 hours (2023-08-26T11:19:51-0400 to 2023-09-09T11:19:55-0400)   Normal sinus rhythm Patient had a min HR of 52 bpm, max HR of 193 bpm, and avg HR of 81 bpm.   82 Supraventricular Tachycardia runs occurred, the run with the fastest interval lasting 9 beats with a max rate of 193 bpm, the longest lasting 19 beats with an avg rate of 157 bpm.  Not patient triggered   Isolated SVEs were rare (<1.0%), SVE Couplets were rare (<1.0%), and SVE Triplets were rare (<1.0%).  Isolated VEs were rare (<1.0%), VE Couplets were rare (<1.0%), and no VE Triplets were present. Ventricular Bigeminy and Trigeminy were present.    Patient triggered events associated with normal sinus rhythm, rare PVC   Signed, Esmond Plants, MD, Ph.D [facility]    Echo 12/2021  1. Left ventricular ejection fraction, by estimation, is 60 to 65%. The  left  ventricle has normal function. The left ventricle has no regional  wall motion abnormalities. Left ventricular diastolic parameters are  consistent with Grade I diastolic  dysfunction (impaired relaxation).   2. Right ventricular systolic function is normal. The right ventricular  size is normal. Tricuspid regurgitation signal is inadequate for assessing  PA pressure.   3. The mitral valve is normal in structure. No evidence of mitral valve  regurgitation. No evidence of mitral stenosis.   4. The aortic valve is normal in structure. There is mild calcification  of the aortic valve. Aortic valve regurgitation is not visualized. Aortic  valve sclerosis is present, with no evidence of aortic valve stenosis.  5. The inferior vena cava is normal in size with greater than 50%  respiratory variability, suggesting right atrial pressure of 3 mmHg.   EKG:  EKG is not ordered today.    Recent Labs: 04/17/2021: B Natriuretic Peptide 42.0 04/22/2021: Magnesium 2.2 12/12/2021: TSH 2.67 12/28/2021: ALT 7 01/15/2022: BUN 15; Creatinine, Ser 0.90; Hemoglobin 12.8; Platelets 229; Potassium 3.7; Sodium 139  Recent Lipid Panel    Component Value Date/Time   CHOL 342 (H) 12/28/2021 0936   TRIG 299.0 (H) 12/28/2021 0936   HDL 57.70 12/28/2021 0936   CHOLHDL 6 12/28/2021 0936   VLDL 59.8 (H) 12/28/2021 0936   LDLCALC 103 (H) 11/03/2013 0925   LDLDIRECT 111.0 12/28/2021 0936    Physical Exam:    VS:  BP (!) 140/90 (BP Location: Left Arm, Patient Position: Sitting, Cuff Size: Large)   Pulse 76   Ht 5\' 6"  (1.676 m)   Wt 212 lb (96.2 kg)   SpO2 97%   BMI 34.22 kg/m     Wt Readings from Last 3 Encounters:  01/31/22 212 lb (96.2 kg)  01/25/22 211 lb (95.7 kg)  01/19/22 211 lb (95.7 kg)     GEN:  Well nourished, well developed in no acute distress HEENT: Normal NECK: No JVD; No carotid bruits LYMPHATICS: No lymphadenopathy CARDIAC: RRR, no murmurs, rubs, gallops RESPIRATORY:  Clear to  auscultation without rales, wheezing or rhonchi  ABDOMEN: Soft, non-tender, non-distended MUSCULOSKELETAL:  No edema; No deformity  SKIN: Warm and dry NEUROLOGIC:  Alert and oriented x 3 PSYCHIATRIC:  Normal affect   ASSESSMENT:    1. Palpitations   2. SVT (supraventricular tachycardia)   3. Chronic diastolic heart failure ([place])   4. Essential hypertension    PLAN:    In order of problems listed above:  Palpitations pSVT Heart monitor showed 82 runs of SVT, longest 19 beats, triggered events associated with NSR and PVCs. She reports persistent palpitations. I will start Lopressor 25mg  BID. I will refer to EP for SVT for any further recommendations.  HFpEF Echo showed LVEF 60-65%, no WMA, and G1DD. She is euvolemic on exam. Start Lopressor as above. CHF education discussed in detail.   HTN BP mildly elevated. I will start Lopressor 25mg  BID as above. Continue HCTZ 25mg  daily, Hydralazine 12.5mg TID.   Disposition: Follow up in 3 month(s) with MD/APP     Signed, Lathaniel Legate Ninfa Meeker, PA-C  01/31/2022 1:56 PM    [facility]

## 2022-02-03 ENCOUNTER — Encounter: Payer: Self-pay | Admitting: Family Medicine

## 2022-02-03 ENCOUNTER — Encounter: Payer: Self-pay | Admitting: Emergency Medicine

## 2022-02-03 ENCOUNTER — Other Ambulatory Visit: Payer: Self-pay

## 2022-02-03 ENCOUNTER — Emergency Department
Admission: EM | Admit: 2022-02-03 | Discharge: 2022-02-03 | Disposition: A | Discharge status: Home / Self Care | Payer: Medicare Other | Attending: Student in an Organized Health Care Education/Training Program | Admitting: Student in an Organized Health Care Education/Training Program

## 2022-02-03 DIAGNOSIS — R220 Localized swelling, mass and lump, head: Secondary | ICD-10-CM | POA: Diagnosis present

## 2022-02-03 DIAGNOSIS — T783XXA Angioneurotic edema, initial encounter: Secondary | ICD-10-CM | POA: Insufficient documentation

## 2022-02-03 MED ORDER — FAMOTIDINE IN NACL 20-0.9 MG/50ML-% IV SOLN
20.0000 mg | Freq: Once | INTRAVENOUS | Status: AC
  Administered 2022-02-03: 20 mg via INTRAVENOUS
  Filled 2022-02-03: qty 50

## 2022-02-03 MED ORDER — PREDNISONE 50 MG PO TABS: ORAL_TABLET | ORAL | 0 refills | Status: DC

## 2022-02-03 MED ORDER — DIPHENHYDRAMINE HCL 50 MG/ML IJ SOLN
12.5000 mg | Freq: Once | INTRAMUSCULAR | Status: AC
  Administered 2022-02-03: 12.5 mg via INTRAVENOUS
  Filled 2022-02-03: qty 1

## 2022-02-03 MED ORDER — EPINEPHRINE 0.3 MG/0.3ML IJ SOAJ
0.3000 mg | Freq: Once | INTRAMUSCULAR | Status: AC
  Administered 2022-02-03: 0.3 mg via INTRAMUSCULAR
  Filled 2022-02-03: qty 0.3

## 2022-02-03 MED ORDER — METHYLPREDNISOLONE SODIUM SUCC 125 MG IJ SOLR
125.0000 mg | Freq: Once | INTRAMUSCULAR | Status: AC
  Administered 2022-02-03: 125 mg via INTRAVENOUS
  Filled 2022-02-03: qty 2

## 2022-02-03 NOTE — Discharge Instructions (Signed)
Take benadryl 25 mg every 6 hours for the next 2 days.   Thank you for choosing [place] for your health care today!  Please see your primary doctor this week for a follow up appointment.   If you do not have a primary doctor call the following clinics to establish care:  If you have insurance:  [facility] [phone] [place] [address]   [facility]  [phone] [address] [place] [address]   If you do not have insurance:  [facility]  [phone] [address], [place] [address]  Sometimes, in the early stages of certain disease courses it is difficult to detect in the emergency department evaluation -- so, it is important that you continue to monitor your symptoms and call your doctor right away or return to the emergency department if you develop any new or worsening symptoms.  It was my pleasure to care for you today.   Hoover Brunette Jacelyn Grip, MD

## 2022-02-03 NOTE — ED Provider Notes (Signed)
[facility] Provider Note    Event Date/Time   First MD Initiated Contact with Patient 02/03/22 1417     (approximate)   History   Oral Swelling   HPI  Sharon [name] is a 71 y.o. female with history of recurrent angioedema presents to the ER for evaluation of swelling of the right side of her tongue started shortly prior to arrival.  Uncertain as to any precipitating factors.  She is no longer an ACE inhibitor or ARB.  She was admitted and intubated for airway protection for angioedema in December of last year.  States that the symptoms currently are much more mild compared to that episode.     Physical Exam   Triage Vital Signs: ED Triage Vitals [02/03/22 1409]  Enc Vitals Group     BP (!) 136/102     Pulse Rate 96     Resp 18     Temp      Temp src      SpO2 98 %     Weight 211 lb 10.3 oz (96 kg)     Height 5\' 6"  (1.676 m)     Head Circumference      Peak Flow      Pain Score 0     Pain Loc      Pain Edu?      Excl. in [place]?     Most recent vital signs: Vitals:   02/03/22 1409  BP: (!) 136/102  Pulse: 96  Resp: 18  SpO2: 98%     Constitutional: Alert  Eyes: Conjunctivae are normal.  Head: Atraumatic. Nose: No congestion/rhinnorhea. Mouth/Throat: Mucous membranes are moist.  Mild angioedema of the right side of the tongue.  No muffled voice.  Normal phonation.  No uvular edema. Neck: Painless ROM.  Cardiovascular:   Good peripheral circulation. Respiratory: Normal respiratory effort.  No retractions.  Gastrointestinal: Soft and nontender.  Musculoskeletal:  no deformity Neurologic:  MAE spontaneously. No gross focal neurologic deficits are appreciated.  Skin:  Skin is warm, dry and intact. No rash noted. Psychiatric: Mood and affect are normal. Speech and behavior are normal.    ED Results / Procedures / Treatments   Labs (all labs ordered are listed, but only abnormal results are displayed) Labs Reviewed - No data to  display   EKG     RADIOLOGY    PROCEDURES:  Critical Care performed: Yes, see critical care procedure note(s)  .Critical Care  Performed by: Merlyn Lot, MD Authorized by: Merlyn Lot, MD   Critical care provider statement:    Critical care time (minutes):  35   Critical care was necessary to treat or prevent imminent or life-threatening deterioration of the following conditions: angioedema.   Critical care was time spent personally by me on the following activities:  Ordering and performing treatments and interventions, ordering and review of laboratory studies, ordering and review of radiographic studies, pulse oximetry, re-evaluation of patient's condition, review of old charts, obtaining history from patient or surrogate, examination of patient, evaluation of patient's response to treatment, discussions with primary provider, discussions with consultants and development of treatment plan with patient or surrogate    MEDICATIONS ORDERED IN ED: Medications  EPINEPHrine (EPI-PEN) injection 0.3 mg (0.3 mg Intramuscular Given 02/03/22 1440)  methylPREDNISolone sodium succinate (SOLU-MEDROL) 125 mg/2 mL injection 125 mg (125 mg Intravenous Given 02/03/22 1440)  diphenhydrAMINE (BENADRYL) injection 12.5 mg (12.5 mg Intravenous Given 02/03/22 1440)  famotidine (PEPCID) IVPB 20 mg  premix (20 mg Intravenous New Bag/Given 02/03/22 1441)     IMPRESSION / MDM / ASSESSMENT AND PLAN / ED COURSE  I reviewed the triage vital signs and the nursing notes.                              Differential diagnosis includes, but is not limited to, angioedema, glossitis, anaphylaxis, Ludwigs angina  Patient presenting to the ER for evaluation of symptoms as described above.  Based on symptoms, risk factors and considered above differential, this presenting complaint could reflect a potentially life-threatening illness therefore the patient will be placed on continuous pulse oximetry and  telemetry for monitoring.  She will be given EpiPen, Pepcid, Benadryl and Solu-Medrol.  We will closely monitor.   Clinical Course as of 02/03/22 1524  Fri Feb 03, 2022  1521 Patient reassessed.  Patient did feel like her tongue swelling had progressed a little bit but then when she received the EpiPen and medications she feels like it has stabilized.  We will continue to closely observe.  Patient be signed out to oncoming physician pending further period of observation determine appropriate dispo.  She is still speaking clearly no throat swelling or muffled voice.  We will continue observe. [PR]    Clinical Course User Index [PR] Willy Eddy, MD     FINAL CLINICAL IMPRESSION(S) / ED DIAGNOSES   Final diagnoses:  Angioedema, initial encounter     Rx / DC Orders   ED Discharge Orders     None        Note:  This document was prepared using Dragon voice recognition software and may include unintentional dictation errors.    Willy Eddy, MD 02/03/22 1524

## 2022-02-03 NOTE — ED Triage Notes (Signed)
C/O tongue swelling x 3 hours.

## 2022-02-03 NOTE — ED Provider Notes (Addendum)
Pt received in sign out with recurrent angioedema despite stopping ACE/ARBs in Jan 2023 who is here with R sided tongue swelling this afternoon at 1PM. Got meds by previous ED provider with some improvement reported by the patient.   Exam at this time shows R sided tongue swelling with no posterior oropharyngeal involvment and patient appears comfortable maintains secretions and no resp distress.   Con't obs for improvement pt aware she will notify right away if any worsening or changes.   Near resolution at 6:18 PM DC   Lucillie Garfinkel, MD 02/03/22 1534    Lucillie Garfinkel, MD 02/03/22 1818

## 2022-02-06 NOTE — Progress Notes (Unsigned)
02/07/2022 9:02 PM   Sharon [name] [birth year] [id number]  Referring provider: Judy Pimple, MD [address] [place],  [place] [address]  Urological history: 1. Mixed incontinence -contributing factors of age, pedal edema, diuretics, HTN, anxiety, obesity, history of smoking and alcohol consumption -PVR *** -oxybutynin XL 10 mg daily    No chief complaint on file.   HPI: Sharon [name] is a 71 y.o. female who presents today for 6 week follow up after starting oxybutynin.  PVR ***   PMH: Past Medical History:  Diagnosis Date   Angio-edema    Anxiety    Cataract    GERD (gastroesophageal reflux disease)    Hyperlipidemia    Hypertension    Hypothyroidism    Vitamin D deficiency     Surgical History: Past Surgical History:  Procedure Laterality Date   CATARACT EXTRACTION W/ INTRAOCULAR LENS IMPLANT Right 12/29/2016   Dr. Mia Creek   CATARACT EXTRACTION W/ INTRAOCULAR LENS IMPLANT Left 01/15/2017   Dr. Mia Creek   COLONOSCOPY     LEEP     TUBAL LIGATION     VEIN LIGATION AND STRIPPING     x2    Home Medications:  Allergies as of 02/07/2022       Reactions   Cozaar [losartan] Swelling   Zinc Gelatin [zinc] Anaphylaxis   Alpha-gal    Amlodipine Besylate    REACTION: edema   Atorvastatin    REACTION: muscle pain   Beef Allergy    Cat Hair Extract    Dust Mite Extract    Neomycin-bacitracin Zn-polymyx    REACTION: rash Per pt not sure if from meds or not        Medication List        Accurate as of February 06, 2022  9:02 PM. If you have any questions, ask your nurse or doctor.          albuterol 108 (90 Base) MCG/ACT inhaler Commonly known as: ProAir HFA TAKE 2 PUFFS BY MOUTH EVERY 6 HOURS AS NEEDED FOR WHEEZE OR SHORTNESS OF BREATH   aspirin 81 MG tablet Take 81 mg by mouth daily.   calcium carbonate 750 MG chewable tablet Commonly known as: TUMS EX Chew 2 tablets by mouth at bedtime.   EPINEPHrine 0.3  mg/0.3 mL [place] injection Commonly known as: EPI-PEN Inject 0.3 mg into the muscle as needed for anaphylaxis.   fluticasone 50 MCG/ACT nasal spray Commonly known as: FLONASE USE 2 SPRAYS IN BOTH  NOSTRILS DAILY   hydrALAZINE 25 MG tablet Commonly known as: APRESOLINE Take 0.5 tablets (12.5 mg total) by mouth every 8 (eight) hours.   hydrochlorothiazide 25 MG tablet Commonly known as: HYDRODIURIL Take 1 tablet (25 mg total) by mouth daily.   levothyroxine 50 MCG tablet Commonly known as: SYNTHROID TAKE 1 TABLET BY MOUTH DAILY  BEFORE BREAKFAST   metoprolol tartrate 25 MG tablet Commonly known as: LOPRESSOR Take 1 tablet (25 mg total) by mouth 2 (two) times daily.   oxybutynin 10 MG 24 hr tablet Commonly known as: DITROPAN-XL Take 1 tablet (10 mg total) by mouth daily.   pantoprazole 40 MG tablet Commonly known as: PROTONIX TAKE 1 TABLET BY MOUTH DAILY   predniSONE 50 MG tablet Commonly known as: DELTASONE Take one tablet daily for 3 days starting 02/04/2022   rosuvastatin 20 MG tablet Commonly known as: Crestor Take 1 tablet (20 mg total) by mouth daily.   sertraline 100 MG tablet Commonly  known as: ZOLOFT TAKE 1 TABLET BY MOUTH DAILY   Vitamin D 1000 units capsule Take 1,000 Units by mouth daily.        Allergies:  Allergies  Allergen Reactions   Cozaar [Losartan] Swelling   Zinc Gelatin [Zinc] Anaphylaxis   Alpha-Gal    Amlodipine Besylate     REACTION: edema   Atorvastatin     REACTION: muscle pain   Beef Allergy    Cat Hair Extract    Dust Mite Extract    Neomycin-Bacitracin Zn-Polymyx     REACTION: rash Per pt not sure if from meds or not    Family History: Family History  Problem Relation Age of Onset   Transient ischemic attack Mother    Hypertension Mother    Hypertension Father    Colon cancer Neg Hx    Esophageal cancer Neg Hx    Rectal cancer Neg Hx    Stomach cancer Neg Hx     Social History:  reports that she quit smoking  about 25 years ago. Her smoking use included cigarettes. She has never used smokeless tobacco. She reports current alcohol use of about 4.0 standard drinks of alcohol per week. She reports that she does not use drugs.  ROS: Pertinent ROS in HPI  Physical Exam: There were no vitals taken for this visit.  Constitutional:  Well nourished. Alert and oriented, No acute distress. HEENT: [place] AT, moist mucus membranes.  Trachea midline, no masses. Cardiovascular: No clubbing, cyanosis, or edema. Respiratory: Normal respiratory effort, no increased work of breathing. GU: No CVA tenderness.  No bladder fullness or masses. Vulvovaginal atrophy w/ pallor, loss of rugae, introital retraction, excoriations.  Vulvar thinning, fusion of labia, clitoral hood retraction, prominent urethral meatus.   *** external genitalia, *** pubic hair distribution, no lesions.  Normal urethral meatus, no lesions, no prolapse, no discharge.   No urethral masses, tenderness and/or tenderness. No bladder fullness, tenderness or masses. *** vagina mucosa, *** estrogen effect, no discharge, no lesions, *** pelvic support, *** cystocele and *** rectocele noted.  No cervical motion tenderness.  Uterus is freely mobile and non-fixed.  No adnexal/parametria masses or tenderness noted.  Anus and perineum are without rashes or lesions.   ***  Neurologic: Grossly intact, no focal deficits, moving all 4 extremities. Psychiatric: Normal mood and affect.    Laboratory Data: Lab Results  Component Value Date   WBC 7.0 01/15/2022   HGB 12.8 01/15/2022   HCT 39.0 01/15/2022   MCV 94.7 01/15/2022   PLT 229 01/15/2022    Lab Results  Component Value Date   CREATININE 0.90 01/15/2022    Lab Results  Component Value Date   HGBA1C 5.7 12/12/2021    Lab Results  Component Value Date   TSH 2.67 12/12/2021       Component Value Date/Time   CHOL 342 (H) 12/28/2021 0936   HDL 57.70 12/28/2021 0936   CHOLHDL 6 12/28/2021 0936   VLDL  59.8 (H) 12/28/2021 0936   LDLCALC 103 (H) 11/03/2013 0925    Lab Results  Component Value Date   AST 16 12/28/2021   Lab Results  Component Value Date   ALT 7 12/28/2021    Urinalysis    Component Value Date/Time   COLORURINE YELLOW 04/17/2021 1933   APPEARANCEUR Clear 12/27/2021 1415   LABSPEC >1.030 (H) 04/17/2021 1933   PHURINE 6.0 04/17/2021 1933   GLUCOSEU Negative 12/27/2021 1415   HGBUR NEGATIVE 04/17/2021 1933   HGBUR large 01/31/2007  1019   BILIRUBINUR Negative 12/27/2021 1415   KETONESUR NEGATIVE 04/17/2021 1933   PROTEINUR Negative 12/27/2021 1415   PROTEINUR 30 (A) 04/17/2021 1933   UROBILINOGEN 0.2 12/09/2021 1553   UROBILINOGEN 0.2 01/31/2007 1019   NITRITE Negative 12/27/2021 1415   NITRITE NEGATIVE 04/17/2021 1933   LEUKOCYTESUR Negative 12/27/2021 1415   LEUKOCYTESUR NEGATIVE 04/17/2021 1933  I have reviewed the labs.   Pertinent Imaging: ***  Assessment & Plan:  ***  1. Mixed incontinence ***  No follow-ups on file.  These notes generated with voice recognition software. I apologize for typographical errors.  Cloretta Ned  [facility] [address]  [address] [place], [place] [address] [phone]

## 2022-02-07 ENCOUNTER — Encounter: Payer: Self-pay | Admitting: Urology

## 2022-02-07 ENCOUNTER — Ambulatory Visit: Payer: Medicare Other | Admitting: Urology

## 2022-02-07 VITALS — BP 134/89 | HR 74 | Ht 66.0 in | Wt 209.0 lb

## 2022-02-07 DIAGNOSIS — N3946 Mixed incontinence: Secondary | ICD-10-CM

## 2022-02-07 LAB — MICROSCOPIC EXAMINATION

## 2022-02-07 LAB — URINALYSIS, COMPLETE
Bilirubin, UA: NEGATIVE
Glucose, UA: NEGATIVE
Ketones, UA: NEGATIVE
Nitrite, UA: NEGATIVE
Protein,UA: NEGATIVE
RBC, UA: NEGATIVE
Specific Gravity, UA: 1.015 (ref 1.005–1.030)
Urobilinogen, Ur: 0.2 mg/dL (ref 0.2–1.0)
pH, UA: 5.5 (ref 5.0–7.5)

## 2022-02-07 LAB — BLADDER SCAN AMB NON-IMAGING: Scan Result: 0

## 2022-02-07 NOTE — Addendum Note (Signed)
Addended by: Zara Council A on: 02/07/2022 04:45 PM   Modules accepted: Orders

## 2022-02-07 NOTE — Patient Instructions (Addendum)
   This is the brand name of the medication that can help with the pains of a UTI while waiting for an antibiotic or something similar would be fine.

## 2022-02-08 ENCOUNTER — Telehealth: Payer: Self-pay

## 2022-02-08 NOTE — Telephone Encounter (Signed)
        Patient  visited [facility] on 10/13   Telephone encounter attempt :  1st  A HIPAA compliant voice message was left requesting a return call.  Instructed patient to call back     [facility] Re[facility] Care Guide, [place], Care Management  [phone] [address], , [place] [address] Phone: [phone] Email: [email or web address]       

## 2022-02-10 ENCOUNTER — Telehealth: Payer: Self-pay | Admitting: *Deleted

## 2022-02-10 LAB — CULTURE, URINE COMPREHENSIVE

## 2022-02-10 NOTE — Telephone Encounter (Signed)
-----   Message from [place], [place] sent at 02/10/2022 12:12 PM EDT ----- Urine culture negative for infection, please keep taking the oxybutynin and follow up with [facility] for cath UA and symptom recheck in 4 more weeks.

## 2022-02-10 NOTE — Telephone Encounter (Signed)
.  left message to have patient return my call.  

## 2022-02-13 NOTE — Telephone Encounter (Signed)
Spoke with patient and advised results Appointment made

## 2022-02-14 ENCOUNTER — Observation Stay
Admission: EM | Admit: 2022-02-14 | Discharge: 2022-02-15 | Disposition: A | Discharge status: Home / Self Care | Payer: Medicare Other | Attending: Family Medicine | Admitting: Family Medicine

## 2022-02-14 DIAGNOSIS — T783XXA Angioneurotic edema, initial encounter: Secondary | ICD-10-CM | POA: Diagnosis not present

## 2022-02-14 DIAGNOSIS — E876 Hypokalemia: Secondary | ICD-10-CM | POA: Insufficient documentation

## 2022-02-14 DIAGNOSIS — T781XXA Other adverse food reactions, not elsewhere classified, initial encounter: Secondary | ICD-10-CM | POA: Diagnosis present

## 2022-02-14 DIAGNOSIS — Z87891 Personal history of nicotine dependence: Secondary | ICD-10-CM | POA: Insufficient documentation

## 2022-02-14 DIAGNOSIS — Z79899 Other long term (current) drug therapy: Secondary | ICD-10-CM | POA: Diagnosis not present

## 2022-02-14 DIAGNOSIS — Z7982 Long term (current) use of aspirin: Secondary | ICD-10-CM | POA: Insufficient documentation

## 2022-02-14 DIAGNOSIS — I10 Essential (primary) hypertension: Secondary | ICD-10-CM | POA: Insufficient documentation

## 2022-02-14 DIAGNOSIS — E039 Hypothyroidism, unspecified: Secondary | ICD-10-CM | POA: Diagnosis not present

## 2022-02-14 LAB — MAGNESIUM: Magnesium: 1.8 mg/dL (ref 1.7–2.4)

## 2022-02-14 LAB — CBC
HCT: 41.6 % (ref 36.0–46.0)
HCT: 38.5 % (ref 36.0–46.0)
Hemoglobin: 13.6 g/dL (ref 12.0–15.0)
Hemoglobin: 12.7 g/dL (ref 12.0–15.0)
MCH: 31.5 pg (ref 26.0–34.0)
MCH: 31.2 pg (ref 26.0–34.0)
MCHC: 32.7 g/dL (ref 30.0–36.0)
MCHC: 33 g/dL (ref 30.0–36.0)
MCV: 96.3 fL (ref 80.0–100.0)
MCV: 94.6 fL (ref 80.0–100.0)
Platelets: 245 10*3/uL (ref 150–400)
Platelets: 152 10*3/uL (ref 150–400)
RBC: 4.32 MIL/uL (ref 3.87–5.11)
RBC: 4.07 MIL/uL (ref 3.87–5.11)
RDW: 13.8 % (ref 11.5–15.5)
RDW: 13.8 % (ref 11.5–15.5)
WBC: 8.6 10*3/uL (ref 4.0–10.5)
WBC: 5.9 10*3/uL (ref 4.0–10.5)
nRBC: 0 % (ref 0.0–0.2)
nRBC: 0 % (ref 0.0–0.2)

## 2022-02-14 LAB — COMPREHENSIVE METABOLIC PANEL
ALT: 13 U/L (ref 0–44)
AST: 20 U/L (ref 15–41)
Albumin: 3.8 g/dL (ref 3.5–5.0)
Alkaline Phosphatase: 64 U/L (ref 38–126)
Anion gap: 10 (ref 5–15)
BUN: 14 mg/dL (ref 8–23)
CO2: 22 mmol/L (ref 22–32)
Calcium: 9 mg/dL (ref 8.9–10.3)
Chloride: 108 mmol/L (ref 98–111)
Creatinine, Ser: 0.77 mg/dL (ref 0.44–1.00)
GFR, Estimated: >60 mL/min (ref >60)
Glucose, Bld: 154 mg/dL — ABNORMAL HIGH (ref 70–99)
Potassium: 3.2 mmol/L — ABNORMAL LOW (ref 3.5–5.1)
Sodium: 140 mmol/L (ref 135–145)
Total Bilirubin: 0.5 mg/dL (ref 0.3–1.2)
Total Protein: 7 g/dL (ref 6.5–8.1)

## 2022-02-14 LAB — BASIC METABOLIC PANEL
Anion gap: 10 (ref 5–15)
BUN: 16 mg/dL (ref 8–23)
CO2: 25 mmol/L (ref 22–32)
Calcium: 9.5 mg/dL (ref 8.9–10.3)
Chloride: 105 mmol/L (ref 98–111)
Creatinine, Ser: 1.04 mg/dL — ABNORMAL HIGH (ref 0.44–1.00)
GFR, Estimated: 57 mL/min — ABNORMAL LOW (ref >60)
Glucose, Bld: 118 mg/dL — ABNORMAL HIGH (ref 70–99)
Potassium: 3 mmol/L — ABNORMAL LOW (ref 3.5–5.1)
Sodium: 140 mmol/L (ref 135–145)

## 2022-02-14 LAB — GLUCOSE, CAPILLARY
Glucose-Capillary: 139 mg/dL — ABNORMAL HIGH (ref 70–99)
Glucose-Capillary: 152 mg/dL — ABNORMAL HIGH (ref 70–99)
Glucose-Capillary: 132 mg/dL — ABNORMAL HIGH (ref 70–99)
Glucose-Capillary: 154 mg/dL — ABNORMAL HIGH (ref 70–99)
Glucose-Capillary: 155 mg/dL — ABNORMAL HIGH (ref 70–99)
Glucose-Capillary: 109 mg/dL — ABNORMAL HIGH (ref 70–99)

## 2022-02-14 LAB — SEDIMENTATION RATE: Sed Rate: 30 mm/hr (ref 0–30)

## 2022-02-14 LAB — C-REACTIVE PROTEIN: CRP: 0.8 mg/dL (ref <1.0)

## 2022-02-14 LAB — PHOSPHORUS: Phosphorus: 3.4 mg/dL (ref 2.5–4.6)

## 2022-02-14 LAB — MRSA NEXT GEN BY PCR, NASAL: MRSA by PCR Next Gen: NOT DETECTED

## 2022-02-14 MED ORDER — POTASSIUM CHLORIDE 10 MEQ/100ML IV SOLN
10.0000 meq | Freq: Once | INTRAVENOUS | Status: AC
  Administered 2022-02-14: 10 meq via INTRAVENOUS
  Filled 2022-02-14: qty 100

## 2022-02-14 MED ORDER — METHYLPREDNISOLONE SODIUM SUCC 125 MG IJ SOLR
INTRAMUSCULAR | Status: AC
  Administered 2022-02-14: 125 mg via INTRAVENOUS
  Filled 2022-02-14: qty 2

## 2022-02-14 MED ORDER — FAMOTIDINE IN NACL 20-0.9 MG/50ML-% IV SOLN: 20.0000 mg | Freq: Once | INTRAVENOUS | Status: AC

## 2022-02-14 MED ORDER — EPINEPHRINE 0.3 MG/0.3ML IJ SOAJ
INTRAMUSCULAR | Status: AC
  Administered 2022-02-14: 0.3 mg via INTRAMUSCULAR
  Filled 2022-02-14: qty 0.3

## 2022-02-14 MED ORDER — DIPHENHYDRAMINE HCL 50 MG/ML IJ SOLN
12.5000 mg | Freq: Once | INTRAMUSCULAR | Status: AC
  Administered 2022-02-14: 12.5 mg via INTRAVENOUS
  Filled 2022-02-14: qty 1

## 2022-02-14 MED ORDER — FOLIC ACID 5 MG/ML IJ SOLN
1.0000 mg | Freq: Every day | INTRAMUSCULAR | Status: DC
  Filled 2022-02-14 (×2): qty 0.2

## 2022-02-14 MED ORDER — THIAMINE MONONITRATE 100 MG PO TABS
100.0000 mg | ORAL_TABLET | Freq: Every day | ORAL | Status: DC
  Administered 2022-02-14 – 2022-02-15 (×2): 100 mg via ORAL
  Filled 2022-02-14 (×2): qty 1

## 2022-02-14 MED ORDER — DEXAMETHASONE SODIUM PHOSPHATE 4 MG/ML IJ SOLN
4.0000 mg | Freq: Four times a day (QID) | INTRAMUSCULAR | Status: DC
  Administered 2022-02-14 – 2022-02-15 (×6): 4 mg via INTRAVENOUS
  Filled 2022-02-14 (×6): qty 1

## 2022-02-14 MED ORDER — FOLIC ACID 1 MG PO TABS
1.0000 mg | ORAL_TABLET | Freq: Every day | ORAL | Status: DC
  Administered 2022-02-14 – 2022-02-15 (×2): 1 mg via ORAL
  Filled 2022-02-14 (×2): qty 1

## 2022-02-14 MED ORDER — POTASSIUM CHLORIDE 10 MEQ/100ML IV SOLN
10.0000 meq | INTRAVENOUS | Status: DC
  Administered 2022-02-14: 10 meq via INTRAVENOUS
  Filled 2022-02-14 (×4): qty 100

## 2022-02-14 MED ORDER — CHLORHEXIDINE GLUCONATE CLOTH 2 % EX PADS
6.0000 | MEDICATED_PAD | Freq: Every day | CUTANEOUS | Status: DC
  Administered 2022-02-14: 6 via TOPICAL

## 2022-02-14 MED ORDER — POLYETHYLENE GLYCOL 3350 17 G PO PACK: 17.0000 g | PACK | Freq: Every day | ORAL | Status: DC | PRN

## 2022-02-14 MED ORDER — ALBUTEROL SULFATE (2.5 MG/3ML) 0.083% IN NEBU: 2.5000 mg | INHALATION_SOLUTION | RESPIRATORY_TRACT | Status: DC | PRN

## 2022-02-14 MED ORDER — TRANEXAMIC ACID-NACL 1000-0.7 MG/100ML-% IV SOLN
1000.0000 mg | Freq: Once | INTRAVENOUS | Status: AC
  Administered 2022-02-14: 1000 mg via INTRAVENOUS
  Filled 2022-02-14: qty 100

## 2022-02-14 MED ORDER — POTASSIUM CHLORIDE 20 MEQ PO PACK
40.0000 meq | PACK | Freq: Three times a day (TID) | ORAL | Status: AC
  Administered 2022-02-14 (×2): 40 meq via ORAL
  Filled 2022-02-14 (×2): qty 2

## 2022-02-14 MED ORDER — LEVOTHYROXINE SODIUM 50 MCG PO TABS
50.0000 ug | ORAL_TABLET | Freq: Every day | ORAL | Status: DC
  Administered 2022-02-15: 50 ug via ORAL
  Filled 2022-02-14: qty 1

## 2022-02-14 MED ORDER — LORAZEPAM 2 MG/ML IJ SOLN: 1.0000 mg | INTRAMUSCULAR | Status: DC | PRN

## 2022-02-14 MED ORDER — EPINEPHRINE 0.3 MG/0.3ML IJ SOAJ: 0.3000 mg | Freq: Once | INTRAMUSCULAR | Status: AC

## 2022-02-14 MED ORDER — TRANEXAMIC ACID FOR EPISTAXIS: 500.0000 mg | Freq: Once | TOPICAL | Status: DC

## 2022-02-14 MED ORDER — LORAZEPAM 1 MG PO TABS: 1.0000 mg | ORAL_TABLET | ORAL | Status: DC | PRN

## 2022-02-14 MED ORDER — MAGNESIUM SULFATE 2 GM/50ML IV SOLN
2.0000 g | Freq: Once | INTRAVENOUS | Status: AC
  Administered 2022-02-14: 2 g via INTRAVENOUS
  Filled 2022-02-14: qty 50

## 2022-02-14 MED ORDER — DOCUSATE SODIUM 100 MG PO CAPS: 100.0000 mg | ORAL_CAPSULE | Freq: Two times a day (BID) | ORAL | Status: DC | PRN

## 2022-02-14 MED ORDER — FAMOTIDINE IN NACL 20-0.9 MG/50ML-% IV SOLN: 20.0000 mg | Freq: Two times a day (BID) | INTRAVENOUS | Status: DC

## 2022-02-14 MED ORDER — HEPARIN SODIUM (PORCINE) 5000 UNIT/ML IJ SOLN
5000.0000 [IU] | Freq: Three times a day (TID) | INTRAMUSCULAR | Status: DC
  Administered 2022-02-14 – 2022-02-15 (×4): 5000 [IU] via SUBCUTANEOUS
  Filled 2022-02-14 (×4): qty 1

## 2022-02-14 MED ORDER — DIPHENHYDRAMINE HCL 50 MG/ML IJ SOLN
50.0000 mg | Freq: Four times a day (QID) | INTRAMUSCULAR | Status: DC
  Administered 2022-02-14 – 2022-02-15 (×6): 50 mg via INTRAVENOUS
  Filled 2022-02-14 (×6): qty 1

## 2022-02-14 MED ORDER — FAMOTIDINE 20 MG PO TABS
20.0000 mg | ORAL_TABLET | Freq: Two times a day (BID) | ORAL | Status: DC
  Administered 2022-02-14 – 2022-02-15 (×3): 20 mg via ORAL
  Filled 2022-02-14 (×3): qty 1

## 2022-02-14 MED ORDER — DIPHENHYDRAMINE HCL 50 MG/ML IJ SOLN
INTRAMUSCULAR | Status: AC
  Administered 2022-02-14: 25 mg via INTRAVENOUS
  Filled 2022-02-14: qty 1

## 2022-02-14 MED ORDER — INSULIN ASPART 100 UNIT/ML IJ SOLN
0.0000 [IU] | INTRAMUSCULAR | Status: DC
  Administered 2022-02-14: 3 [IU] via SUBCUTANEOUS
  Administered 2022-02-14 (×3): 4 [IU] via SUBCUTANEOUS
  Administered 2022-02-15 (×2): 3 [IU] via SUBCUTANEOUS
  Filled 2022-02-14 (×7): qty 1

## 2022-02-14 MED ORDER — THIAMINE HCL 100 MG/ML IJ SOLN: 100.0000 mg | Freq: Every day | INTRAMUSCULAR | Status: DC

## 2022-02-14 MED ORDER — FAMOTIDINE IN NACL 20-0.9 MG/50ML-% IV SOLN
INTRAVENOUS | Status: AC
  Administered 2022-02-14: 20 mg via INTRAVENOUS
  Filled 2022-02-14: qty 50

## 2022-02-14 MED ORDER — DEXAMETHASONE SODIUM PHOSPHATE 10 MG/ML IJ SOLN
10.0000 mg | Freq: Once | INTRAMUSCULAR | Status: AC
  Administered 2022-02-14: 10 mg via INTRAVENOUS
  Filled 2022-02-14: qty 1

## 2022-02-14 MED ORDER — DIPHENHYDRAMINE HCL 50 MG/ML IJ SOLN: 25.0000 mg | Freq: Once | INTRAMUSCULAR | Status: AC

## 2022-02-14 MED ORDER — METHYLPREDNISOLONE SODIUM SUCC 125 MG IJ SOLR: 125.0000 mg | Freq: Once | INTRAMUSCULAR | Status: AC

## 2022-02-14 NOTE — Progress Notes (Signed)
PHARMACY CONSULT NOTE - FOLLOW UP  Pharmacy Consult for Electrolyte Monitoring and Replacement   Recent Labs: Potassium (mmol/L)  Date Value  02/14/2022 3.2 (L)   Magnesium (mg/dL)  Date Value  02/14/2022 1.8   Calcium (mg/dL)  Date Value  02/14/2022 9.0   Albumin (g/dL)  Date Value  02/14/2022 3.8   Phosphorus (mg/dL)  Date Value  02/14/2022 3.4   Sodium (mmol/L)  Date Value  02/14/2022 140     Assessment: 71 y.o. female w/ PMH of angioedema, EtOH dependence, HTN, HLD, hypothyroidism admitted w/ submandibular swelling. Pharmacy is asked to follow and replace electrolytes while in CCU  Goal of Therapy:  Electrolytes WNL  Plan:  --oral KCl 44mEq x 2 --2 grams IV magnesium sulfate x 1 --recheck electrolytes in am  Dallie Piles ,PharmD Clinical Pharmacist 02/14/2022 7:09 AM

## 2022-02-14 NOTE — H&P (Addendum)
NAME:  Sharon [name], MRN:  [id number], DOB:  1950-07-25, LOS: 0 ADMISSION DATE:  02/14/2022, CONSULTATION DATE:  02/14/22 REFERRING MD:  Dr. sung, CHIEF COMPLAINT:  Angioedema   History of Present Illness:  71 yo F presenting to [facility] ED from home with husband on 02/14/22 with complaints of throat swelling.  History provided by husband and patient bedside as well as information from chart review. The patient reported she felt as though she was developing a sore throat Sunday night, as if her throat was swelling, which resolved on its own without intervention. Then after dinner on Monday night 10/23, where she ate shrimp & salad (she has had both prior), she noticed her throat was swelling. She had muffled, hoarse speech per husband. She also confirmed shortness of breath, feeling as though she couldn't "get air out". They denied tongue swelling, chest pain, fever/ chills, & abdominal pain/ nausea/ vomiting/ diarrhea. Of note this is the 7th angioedema event since last Christmas 03/2021, at which time she was intubated and there was a concern this was ACE/ARB induced. She was taken off these medications at that time. She has been working with an allergist outpatient and there is a suspicion after further work up that she has an alpha gal allergy. The patient has been avoiding red meat because of this. She also reports needing to use her albuterol rescue inhaler daily due to wheezing and is not on a daily antihistamine. She is also awaiting a sleep study for possible OSA and recently starting working with cardiology due to palpitations and runs of SVT on monitor device.  Husband is concerned because these events are occurring more frequently, with this visit her 3rd event this month. She is also in the process of trying to get connected with a new allergist. She denies any tobacco product use, she admits to drinking 4 vodka drinks nightly.  ED course: Upon arrival patient received Epi, steroids,  anti-histamines as well as TXA. This worked somewhat but did not take care of all the submandibular swelling. Ice pack was placed without effect. Patient was given additional dose of benadryl and steroids. This did improve the patient's swelling and her voice was then less muffled and hoarse, however it has not completely resolved. PCCM consulted due to [place] for INTUBATION and need for additional monitoring overnight. Medications given: solumedrol 125 mg, 1g TXA, benadryl 25 mg- and 12.5 mg, decadron 10 mg, epi pen & pepcid 20 mg Initial Vitals: 97.9, 13, 73, 145/70 & 97% on RA Significant labs: (Labs/ Imaging personally reviewed) I, Domingo Pulse [place], AGACNP-BC, personally viewed and interpreted this ECG. EKG Interpretation: Date: 02/14/22, EKG Time: 01:01, Rate: 77, Rhythm: NSR, QRS Axis:  normal, Intervals: prolonged Qtc, ST/T Wave abnormalities: none, Narrative Interpretation: NSR with prolonged Qtc Chemistry: Na+: 140, K+: 3.0, BUN/Cr.: 16/ 1.04, Serum CO2/ AG: 25/10 Hematology: WBC: 8.6, Hgb: 13.6,   PCCM consulted for admission due to angioedema and HIGH RISK for INTUBATION.  Pertinent  Medical History  Angioedema pSVT Anxiety EOH dependence GERD HLD HTN Hypothyroidism  Significant Hospital Events: Including procedures, antibiotic start and stop dates in addition to other pertinent events   02/14/22: Admit to ICU because of recurrent episodes of angioedema, HIGH RISK for INTUBATION  Interim History / Subjective:  Patient alert and awake with husband bedside, plan of care discussed all questions answered at this time.  Objective   Blood pressure 116/67, pulse 72, temperature 97.9 F (36.6 C), resp. rate 15, height '5\' 6"'  (  1.676 m), weight 95 kg, SpO2 93 %.        Intake/Output Summary (Last 24 hours) at 02/14/2022 0328 Last data filed at 02/14/2022 0055 Gross per 24 hour  Intake 97.8 ml  Output --  Net 97.8 ml   Filed Weights   02/14/22 0018  Weight: 95 kg     Examination: General: Adult female, acutely ill, lying in bed intubated, NAD HEENT: MM pink/moist, anicteric, atraumatic, neck supple Neuro: A&O x 4, able to follow commands, PERRL +3, MAE CV: s1s2 RRR, NSR on monitor, no r/m/g Pulm: Regular, non labored on 3 L [place], breath sounds clear-BUL & clear/diminished-BLL GI: soft, rounded, non tender, bs x 4 Skin: limited exam- no rashes/lesions noted Extremities: warm/dry, pulses + 2 R/P, no edema noted  Resolved Hospital Problem list     Assessment & Plan:  Recurrent Angioedema Possible Alpha gal allergy HIGH RISK FOR INTUBATION - f/u Tryptase, CRP, ESR >> C4 was elevated last check in April - pepcid BID - benadryl 50 mg Q 6 h, wean once swelling has completely resolved - continue decadron 4 mg Q 6 h, taper once swelling has completely resolved - albuterol nebulizer PRN - continue supplemental O2 PRN to maintain SpO2 > 90% - consider cetirizine 20 mg BID once stabilized to be continued outpatient, as these episodes appear to be more idiopathic. With wheezing symptoms described, patient may benefit from singular as well.  ETOH dependence - thiamine & folic acid IV - ativan PRN per CIWA protocol  Risk for Steroid Induced Hyperglycemia - Monitor CBG Q 4 hours - SSI resistant dosing - target range while in ICU: 140-180 - follow ICU hyper/hypo-glycemia protocol  Hypokalemia K+ supplementation provided - f/u BMP, Mg, Phos >> replace electrolytes PRN  Hyperlipidemia - continue outpatient rosuvastatin once swelling has normalized  Hypertension pSVT - continue outpatient lopressor, hydralazine & hydrochlorothiazide once swelling has normalized  Hypothyroidism - continue outpatient synthroid once swelling has normalized  Best Practice (right click and "Reselect all SmartList Selections" daily)  Diet/type: NPO DVT prophylaxis: prophylactic heparin  GI prophylaxis: H2B Lines: N/A Foley:  N/A Code Status:  full code Last date  of multidisciplinary goals of care discussion [02/14/22]  Labs   CBC: Recent Labs  Lab 02/14/22 0055  WBC 8.6  HGB 13.6  HCT 41.6  MCV 96.3  PLT 628    Basic Metabolic Panel: Recent Labs  Lab 02/14/22 0055  NA 140  K 3.0*  CL 105  CO2 25  GLUCOSE 118*  BUN 16  CREATININE 1.04*  CALCIUM 9.5   GFR: Estimated Creatinine Clearance: 57.6 mL/min (A) (by C-G formula based on SCr of 1.04 mg/dL (H)). Recent Labs  Lab 02/14/22 0055  WBC 8.6    Liver Function Tests: No results for input(s): "AST", "ALT", "ALKPHOS", "BILITOT", "PROT", "ALBUMIN" in the last 168 hours. No results for input(s): "LIPASE", "AMYLASE" in the last 168 hours. No results for input(s): "AMMONIA" in the last 168 hours.  ABG    Component Value Date/Time   PHART 7.41 04/17/2021 1817   PCO2ART 36 04/17/2021 1817   PO2ART 130 (H) 04/17/2021 1817   HCO3 22.8 04/17/2021 1817   ACIDBASEDEF 1.4 04/17/2021 1817   O2SAT 99.0 04/17/2021 1817     Coagulation Profile: No results for input(s): "INR", "PROTIME" in the last 168 hours.  Cardiac Enzymes: No results for input(s): "CKTOTAL", "CKMB", "CKMBINDEX", "TROPONINI" in the last 168 hours.  HbA1C: Hgb A1c MFr Bld  Date/Time Value Ref Range Status  12/12/2021 09:06 AM 5.7 4.6 - 6.5 % Final    Comment:    Glycemic Control Guidelines for People with Diabetes:Non Diabetic:  <6%Goal of Therapy: <7%Additional Action Suggested:  >8%     CBG: No results for input(s): "GLUCAP" in the last 168 hours.  Review of Systems: Positives in BOLD  Gen: Denies fever, chills, weight change, fatigue, night sweats HEENT: Denies blurred vision, double vision, hearing loss, tinnitus, sinus congestion, rhinorrhea, sore throat, neck stiffness, dysphagia, throat swelling PULM: Denies shortness of breath, cough, sputum production, hemoptysis, wheezing CV: Denies chest pain, edema, orthopnea, paroxysmal nocturnal dyspnea, palpitations GI: Denies abdominal pain, nausea,  vomiting, diarrhea, hematochezia, melena, constipation, change in bowel habits GU: Denies dysuria, hematuria, polyuria, oliguria, urethral discharge Endocrine: Denies hot or cold intolerance, polyuria, polyphagia or appetite change Derm: Denies rash, dry skin, scaling or peeling skin change Heme: Denies easy bruising, bleeding, bleeding gums Neuro: Denies headache, numbness, weakness, slurred speech, loss of memory or consciousness  Past Medical History:  She,  has a past medical history of Angio-edema, Anxiety, Cataract, GERD (gastroesophageal reflux disease), Hyperlipidemia, Hypertension, Hypothyroidism, and Vitamin D deficiency.   Surgical History:   Past Surgical History:  Procedure Laterality Date   CATARACT EXTRACTION W/ INTRAOCULAR LENS IMPLANT Right 12/29/2016   Dr. Darleen Crocker   CATARACT EXTRACTION W/ INTRAOCULAR LENS IMPLANT Left 01/15/2017   Dr. Darleen Crocker   COLONOSCOPY     LEEP     TUBAL LIGATION     VEIN LIGATION AND STRIPPING     x2     Social History:   reports that she quit smoking about 25 years ago. Her smoking use included cigarettes. She has never used smokeless tobacco. She reports current alcohol use of about 4.0 standard drinks of alcohol per week. She reports that she does not use drugs.   Family History:  Her family history includes Hypertension in her father and mother; Transient ischemic attack in her mother. There is no history of Colon cancer, Esophageal cancer, Rectal cancer, or Stomach cancer.   Allergies Allergies  Allergen Reactions   Cozaar [Losartan] Swelling   Zinc Gelatin [Zinc] Anaphylaxis   Alpha-Gal    Amlodipine Besylate     REACTION: edema   Atorvastatin     REACTION: muscle pain   Beef Allergy    Cat Hair Extract    Dust Mite Extract    Neomycin-Bacitracin Zn-Polymyx     REACTION: rash Per pt not sure if from meds or not     Home Medications  Prior to Admission medications   Medication Sig Start Date End Date Taking?  Authorizing Provider  albuterol (PROAIR HFA) 108 (90 Base) MCG/ACT inhaler TAKE 2 PUFFS BY MOUTH EVERY 6 HOURS AS NEEDED FOR WHEEZE OR SHORTNESS OF BREATH 04/27/21   Tower, Wynelle Fanny, MD  aspirin 81 MG tablet Take 81 mg by mouth daily.    [provider]  calcium carbonate (TUMS EX) 750 MG chewable tablet Chew 2 tablets by mouth at bedtime.    [provider]  Cholecalciferol (VITAMIN D) 1000 UNITS capsule Take 1,000 Units by mouth daily.    [provider]  EPINEPHrine 0.3 mg/0.3 mL IJ SOAJ injection Inject 0.3 mg into the muscle as needed for anaphylaxis. 04/27/21   Tower, Wynelle Fanny, MD  fluticasone (FLONASE) 50 MCG/ACT nasal spray USE 2 SPRAYS IN BOTH  NOSTRILS DAILY 08/24/21   Tower, Wynelle Fanny, MD  hydrALAZINE (APRESOLINE) 25 MG tablet Take 0.5 tablets (12.5 mg total) by  mouth every 8 (eight) hours. 10/04/21   Tower, Wynelle Fanny, MD  hydrochlorothiazide (HYDRODIURIL) 25 MG tablet Take 1 tablet (25 mg total) by mouth daily. 12/19/21   Tower, Wynelle Fanny, MD  levothyroxine (SYNTHROID) 50 MCG tablet TAKE 1 TABLET BY MOUTH DAILY  BEFORE BREAKFAST 11/09/21   Tower, Wynelle Fanny, MD  metoprolol tartrate (LOPRESSOR) 25 MG tablet Take 1 tablet (25 mg total) by mouth 2 (two) times daily. 01/31/22   Furth, Cadence H, PA-C  oxybutynin (DITROPAN-XL) 10 MG 24 hr tablet Take 1 tablet (10 mg total) by mouth daily. 12/27/21   Hollice Espy, MD  pantoprazole (PROTONIX) 40 MG tablet TAKE 1 TABLET BY MOUTH DAILY 11/09/21   Tower, Wynelle Fanny, MD  predniSONE (DELTASONE) 50 MG tablet Take one tablet daily for 3 days starting 02/04/2022 02/04/22   Lucillie Garfinkel, MD  rosuvastatin (CRESTOR) 20 MG tablet Take 1 tablet (20 mg total) by mouth daily. 01/19/22   Tower, Wynelle Fanny, MD  sertraline (ZOLOFT) 100 MG tablet TAKE 1 TABLET BY MOUTH DAILY 11/09/21   Tower, Wynelle Fanny, MD     Critical care time: 60 minutes       Venetia Night, AGACNP-BC Acute Care Nurse Practitioner [facility]    [phone] / [phone] Please see Amion for pager details.

## 2022-02-14 NOTE — ED Notes (Signed)
Report given to [facility], CCU.

## 2022-02-14 NOTE — ED Notes (Signed)
Patient with oxygen saturation at 90% while sleeping. Placed on 3L per [place]. States she feels her breathing is the same and that she is unsure if she has sleep apnea, states she is supposed to go for a sleep study. Dr. Beather Arbour notified of oxygen supplementation. Dr. Beather Arbour promptly at bedside.

## 2022-02-14 NOTE — Progress Notes (Signed)
[facility] Progress Note Patient Name: Sharon [name] DOB: [birth year] MRN: [id number]   Date of Service  02/14/2022  HPI/Events of Note  71/F who presents with throat swelling and shortness of breath. She was assessed with angioedema, started on epinephrine, antihistamines, steroids, and tranexamic acid. She had mild improvement and was admitted to the ICU for close monitoring and further management. Of note, pt has had multiple hospital admissions for angioedema - pt is suspected to have  alpha gal allergy(?) and has been avoiding red meat.   eICU Interventions  - Admit to ICU  - Give epinephrine.  - Will continue steroids, anti histamines, H2 blocker - Pt awake, alert, not in distress, able to control secretions at this time. - Able to speak clearly, does not sound hoarse presently  - Will continue to monitor closely.  - Maintain low threshold to intubate for airway protection        Janyce Ellinger M DELA CRUZ 02/14/2022, 4:43 AM

## 2022-02-14 NOTE — ED Provider Notes (Signed)
[facility] Provider Note    Event Date/Time   First MD Initiated Contact with Patient 02/14/22 0019     (approximate)   History   Allergic Reaction   HPI  Sharon [name] is a 71 y.o. female who presents to the ED from home with a chief complaint of angioedema.  Patient has recurrent angioedema, seen twice already this month in the emergency department.  Thought to have alpha gal allergy.  ACE inhibitor/ARB's discontinued almost 1 year ago.  Last intubated almost 1 year ago.  Ate a shrimp dinner tonight and noted swelling to her mouth floor starting 2 hours prior to arrival.  Did not take any medications prior to arrival.  Presents with neck swelling and muffled voice.  Denies chest pain, shortness of breath, abdominal pain, nausea, vomiting or diarrhea.  Does state usually she presents with tongue swelling; neck swelling is new.     Past Medical History   Past Medical History:  Diagnosis Date   Angio-edema    Anxiety    Cataract    GERD (gastroesophageal reflux disease)    Hyperlipidemia    Hypertension    Hypothyroidism    Vitamin D deficiency      Active Problem List   Patient Active Problem List   Diagnosis Date Noted   Urinary incontinence 12/09/2021   Pedal edema 07/26/2021   Diarrhea 04/27/2021   Angioedema 04/17/2021   Dizziness 08/23/2020   Fatigue 08/23/2020   Elevated fasting glucose 12/17/2019   Medicare annual wellness visit, subsequent 08/28/2018   Cough 01/28/2018   Welcome to Medicare preventive visit 08/22/2016   Obesity 10/05/2014   Anemia 09/24/2013   Palpitations 03/03/2013   Colon cancer screening 03/03/2013   Hypokalemia 10/09/2011   Routine general medical examination at a health care facility 10/02/2011   Vitamin D deficiency disease 12/28/2010   ARTHRALGIA 08/19/2007   Hypothyroidism 01/31/2007   Hyperlipidemia 01/31/2007   Anxiety state 01/31/2007   Essential hypertension 01/31/2007   GERD 01/31/2007      Past Surgical History   Past Surgical History:  Procedure Laterality Date   CATARACT EXTRACTION W/ INTRAOCULAR LENS IMPLANT Right 12/29/2016   Dr. Mia Creek   CATARACT EXTRACTION W/ INTRAOCULAR LENS IMPLANT Left 01/15/2017   Dr. Mia Creek   COLONOSCOPY     LEEP     TUBAL LIGATION     VEIN LIGATION AND STRIPPING     x2     Home Medications   Prior to Admission medications   Medication Sig Start Date End Date Taking? Authorizing Provider  albuterol (PROAIR HFA) 108 (90 Base) MCG/ACT inhaler TAKE 2 PUFFS BY MOUTH EVERY 6 HOURS AS NEEDED FOR WHEEZE OR SHORTNESS OF BREATH 04/27/21   Tower, Audrie Gallus, MD  aspirin 81 MG tablet Take 81 mg by mouth daily.    [provider]  calcium carbonate (TUMS EX) 750 MG chewable tablet Chew 2 tablets by mouth at bedtime.    [provider]  Cholecalciferol (VITAMIN D) 1000 UNITS capsule Take 1,000 Units by mouth daily.    [provider]  EPINEPHrine 0.3 mg/0.3 mL IJ SOAJ injection Inject 0.3 mg into the muscle as needed for anaphylaxis. 04/27/21   Tower, Audrie Gallus, MD  fluticasone (FLONASE) 50 MCG/ACT nasal spray USE 2 SPRAYS IN BOTH  NOSTRILS DAILY 08/24/21   Tower, Audrie Gallus, MD  hydrALAZINE (APRESOLINE) 25 MG tablet Take 0.5 tablets (12.5 mg total) by mouth every 8 (eight) hours. 10/04/21  Tower, Audrie Gallus, MD  hydrochlorothiazide (HYDRODIURIL) 25 MG tablet Take 1 tablet (25 mg total) by mouth daily. 12/19/21   Tower, Audrie Gallus, MD  levothyroxine (SYNTHROID) 50 MCG tablet TAKE 1 TABLET BY MOUTH DAILY  BEFORE BREAKFAST 11/09/21   Tower, Audrie Gallus, MD  metoprolol tartrate (LOPRESSOR) 25 MG tablet Take 1 tablet (25 mg total) by mouth 2 (two) times daily. 01/31/22   Furth, Cadence H, PA-C  oxybutynin (DITROPAN-XL) 10 MG 24 hr tablet Take 1 tablet (10 mg total) by mouth daily. 12/27/21   Vanna Scotland, MD  pantoprazole (PROTONIX) 40 MG tablet TAKE 1 TABLET BY MOUTH DAILY 11/09/21   Tower, Audrie Gallus, MD  predniSONE (DELTASONE) 50 MG  tablet Take one tablet daily for 3 days starting 02/04/2022 02/04/22   Pilar Jarvis, MD  rosuvastatin (CRESTOR) 20 MG tablet Take 1 tablet (20 mg total) by mouth daily. 01/19/22   Tower, Audrie Gallus, MD  sertraline (ZOLOFT) 100 MG tablet TAKE 1 TABLET BY MOUTH DAILY 11/09/21   Tower, Audrie Gallus, MD     Allergies  Cozaar [losartan], Zinc gelatin [zinc], Alpha-gal, Amlodipine besylate, Atorvastatin, Beef allergy, Cat hair extract, Dust mite extract, and Neomycin-bacitracin zn-polymyx   Family History   Family History  Problem Relation Age of Onset   Transient ischemic attack Mother    Hypertension Mother    Hypertension Father    Colon cancer Neg Hx    Esophageal cancer Neg Hx    Rectal cancer Neg Hx    Stomach cancer Neg Hx      Physical Exam  Triage Vital Signs: ED Triage Vitals  Enc Vitals Group     BP 02/14/22 0019 (!) 143/80     Pulse Rate 02/14/22 0019 76     Resp 02/14/22 0019 16     Temp 02/14/22 0019 97.9 F (36.6 C)     Temp src --      SpO2 02/14/22 0019 99 %     Weight 02/14/22 0018 209 lb 7 oz (95 kg)     Height 02/14/22 0018 5\' 6"  (1.676 m)     Head Circumference --      Peak Flow --      Pain Score 02/14/22 0020 4     Pain Loc --      Pain Edu? --      Excl. in GC? --     Updated Vital Signs: BP 129/71   Pulse 70   Temp 97.9 F (36.6 C)   Resp 20   Ht 5\' 6"  (1.676 m)   Wt 95 kg   SpO2 94%   BMI 33.80 kg/m    General: Awake, moderate distress.  CV:  RRR.  Good peripheral perfusion.  Resp:  Normal effort.  CTA B. Abd:  Nontender.  No distention.  Other:  There is no tongue or lip angioedema. + Muffled voice.  No drooling.  Tolerating secretions.  Moderate submandibular swelling.  No swelling to posterior oropharynx.   ED Results / Procedures / Treatments  Labs (all labs ordered are listed, but only abnormal results are displayed) Labs Reviewed  BASIC METABOLIC PANEL - Abnormal; Notable for the following components:      Result Value   Potassium  3.0 (*)    Glucose, Bld 118 (*)    Creatinine, Ser 1.04 (*)    GFR, Estimated 57 (*)    All other components within normal limits  CBC     EKG  ED ECG REPORT I,  Tiyah Zelenak J, the attending physician, personally viewed and interpreted this ECG.   Date: 02/14/2022  EKG Time: 0101  Rate: 77  Rhythm: normal sinus rhythm  Axis: Normal  Intervals: QTc 506  ST&T Change: Nonspecific    RADIOLOGY None   Official radiology report(s): No results found.   PROCEDURES:  Critical Care performed: Yes, see critical care procedure note(s)  CRITICAL CARE Performed by: Paulette Blanch   Total critical care time: 60 minutes  Critical care time was exclusive of separately billable procedures and treating other patients.  Critical care was necessary to treat or prevent imminent or life-threatening deterioration.  Critical care was time spent personally by me on the following activities: development of treatment plan with patient and/or surrogate as well as nursing, discussions with consultants, evaluation of patient's response to treatment, examination of patient, obtaining history from patient or surrogate, ordering and performing treatments and interventions, ordering and review of laboratory studies, ordering and review of radiographic studies, pulse oximetry and re-evaluation of patient's condition.   Marland Kitchen1-3 Lead EKG Interpretation  Performed by: Paulette Blanch, MD Authorized by: Paulette Blanch, MD     Interpretation: normal     ECG rate:  76   ECG rate assessment: normal     Rhythm: sinus rhythm     Ectopy: none     Conduction: normal   Comments:     Patient placed on cardiac monitor to evaluate for arrhythmias    MEDICATIONS ORDERED IN ED: Medications  potassium chloride 10 mEq in 100 mL IVPB (has no administration in time range)  potassium chloride 10 mEq in 100 mL IVPB (has no administration in time range)  famotidine (PEPCID) IVPB 20 mg premix (0 mg Intravenous Stopped  02/14/22 0036)  EPINEPHrine (EPI-PEN) injection 0.3 mg (0.3 mg Intramuscular Given 02/14/22 0024)  diphenhydrAMINE (BENADRYL) injection 25 mg (25 mg Intravenous Given 02/14/22 0024)  methylPREDNISolone sodium succinate (SOLU-MEDROL) 125 mg/2 mL injection 125 mg (125 mg Intravenous Given 02/14/22 0024)  tranexamic acid (CYKLOKAPRON) IVPB 1,000 mg (0 mg Intravenous Stopped 02/14/22 0055)  dexamethasone (DECADRON) injection 10 mg (10 mg Intravenous Given 02/14/22 0130)  diphenhydrAMINE (BENADRYL) injection 12.5 mg (12.5 mg Intravenous Given 02/14/22 0130)     IMPRESSION / MDM / ASSESSMENT AND PLAN / ED COURSE  I reviewed the triage vital signs and the nursing notes.                             71 year old female presenting with acute allergic reaction, angioedema.  Differential diagnosis includes but is not limited to food allergy, alpha gal allergy, hereditary angioedema, drug allergy, etc.  I have personally reviewed patient's records and note a PCP office visit for angioedema follow-up on 01/19/2022. Patient has had 2 ED visits this month on 10/4 and 10/13 for angioedema.  There is an allergist referral from her PCP dated 02/03/2022.  Patient's presentation is most consistent with acute presentation with potential threat to life or bodily function.  The patient is on the cardiac monitor to evaluate for evidence of arrhythmia and/or significant heart rate changes.  Patient was brought emergently to a treatment room.  IV allergic reaction cocktail of EpiPen, IV fluids, 25 mg IV Benadryl, 125 mg IV Solu-Medrol and 20 mg IV Pepcid initiated.  1g IV TXA also added.  Will monitor and care for patient as well as perform frequent reassessments.  Clinical Course as of 02/14/22 0248  Tue Feb 14, 2022  0100 Ice pack applied to submandibular region.  No swelling to posterior oropharynx. [JS]  0118 No significant change.  No swelling to posterior oropharynx. [JS]  9937 Patient resting in no acute  distress.  Room air saturation 97%.  No swelling to posterior oropharynx.  No significant change; submandibular space feels slightly less tense.  We will continue to monitor and care for patient. [JS]  0246 Room air saturations 90% while asleep, placed on 3 L nasal cannula oxygen.  Discussed with CCU intensivist who will evaluate patient in the emergency department for admission. [JS]    Clinical Course User Index [JS] Paulette Blanch, MD     FINAL CLINICAL IMPRESSION(S) / ED DIAGNOSES   Final diagnoses:  Angioedema, initial encounter  Hypokalemia     Rx / DC Orders   ED Discharge Orders     None        Note:  This document was prepared using Dragon voice recognition software and may include unintentional dictation errors.   Paulette Blanch, MD 02/14/22 [phone]

## 2022-02-14 NOTE — ED Triage Notes (Signed)
Pt presents via POV with complaints of allergic reaction - unsure as to what she had a reaction to. She notes eating dinner at 1800 last night and around 2000-2100 she noticed hoarseness, dysphagia, and SOB. Visible swelling of her throat. Denies CP or tongue swelling.

## 2022-02-15 DIAGNOSIS — T783XXD Angioneurotic edema, subsequent encounter: Secondary | ICD-10-CM | POA: Diagnosis not present

## 2022-02-15 LAB — BASIC METABOLIC PANEL
Anion gap: 7 (ref 5–15)
BUN: 13 mg/dL (ref 8–23)
CO2: 22 mmol/L (ref 22–32)
Calcium: 9.3 mg/dL (ref 8.9–10.3)
Chloride: 109 mmol/L (ref 98–111)
Creatinine, Ser: 0.85 mg/dL (ref 0.44–1.00)
GFR, Estimated: >60 mL/min (ref >60)
Glucose, Bld: 135 mg/dL — ABNORMAL HIGH (ref 70–99)
Potassium: 4 mmol/L (ref 3.5–5.1)
Sodium: 138 mmol/L (ref 135–145)

## 2022-02-15 LAB — CBC
HCT: 35 % — ABNORMAL LOW (ref 36.0–46.0)
Hemoglobin: 11.6 g/dL — ABNORMAL LOW (ref 12.0–15.0)
MCH: 31 pg (ref 26.0–34.0)
MCHC: 33.1 g/dL (ref 30.0–36.0)
MCV: 93.6 fL (ref 80.0–100.0)
Platelets: 243 10*3/uL (ref 150–400)
RBC: 3.74 MIL/uL — ABNORMAL LOW (ref 3.87–5.11)
RDW: 13.5 % (ref 11.5–15.5)
WBC: 10.4 10*3/uL (ref 4.0–10.5)
nRBC: 0 % (ref 0.0–0.2)

## 2022-02-15 LAB — MAGNESIUM: Magnesium: 2.3 mg/dL (ref 1.7–2.4)

## 2022-02-15 LAB — GLUCOSE, CAPILLARY
Glucose-Capillary: 138 mg/dL — ABNORMAL HIGH (ref 70–99)
Glucose-Capillary: 135 mg/dL — ABNORMAL HIGH (ref 70–99)

## 2022-02-15 LAB — PHOSPHORUS: Phosphorus: 2.8 mg/dL (ref 2.5–4.6)

## 2022-02-15 MED ORDER — DIPHENHYDRAMINE HCL 25 MG PO TABS: 25.0000 mg | ORAL_TABLET | Freq: Four times a day (QID) | ORAL | 0 refills | Status: DC | PRN

## 2022-02-15 MED ORDER — VITAMIN B-1 100 MG PO TABS: 100.0000 mg | ORAL_TABLET | Freq: Every day | ORAL | 1 refills | Status: DC

## 2022-02-15 MED ORDER — FOLIC ACID 1 MG PO TABS: 1.0000 mg | ORAL_TABLET | Freq: Every day | ORAL | 1 refills | Status: DC

## 2022-02-15 MED ORDER — PREDNISONE 20 MG PO TABS: ORAL_TABLET | ORAL | 0 refills | Status: DC

## 2022-02-15 NOTE — Discharge Instructions (Signed)
Advised to follow-up with primary care physician in 1 week. Advised to take Benadryl 25 mg as needed for itching or throat swelling. Advised to take prednisone 40 mg daily for 3 more days. Advised to follow-up with allergy specialist as scheduled.

## 2022-02-15 NOTE — Progress Notes (Signed)
[facility] for Electrolyte Monitoring and Replacement   Recent Labs: Potassium (mmol/L)  Date Value  02/15/2022 4.0   Magnesium (mg/dL)  Date Value  02/15/2022 2.3   Calcium (mg/dL)  Date Value  02/15/2022 9.3   Albumin (g/dL)  Date Value  02/14/2022 3.8   Phosphorus (mg/dL)  Date Value  02/15/2022 2.8   Sodium (mmol/L)  Date Value  02/15/2022 138     Assessment: 71 y.o. female w/ PMH of angioedema, EtOH dependence, HTN, HLD, hypothyroidism admitted w/ submandibular swelling. Pharmacy is asked to follow and replace electrolytes while in CCU  Goal of Therapy:  Electrolytes WNL  Plan:  --no electrolyte replacement warranted for today --recheck electrolytes in am  Dallie Piles ,PharmD Clinical Pharmacist 02/15/2022 8:33 AM

## 2022-02-15 NOTE — Discharge Summary (Signed)
Physician Discharge Summary  Sharon [name] ZOX:[id number] DOB: 01-27-1951 DOA: 02/14/2022  PCP: Abner Greenspan, MD  Admit date: 02/14/2022  Discharge date: 02/15/2022  Admitted From: Home.  Disposition:  Home  Recommendations for Outpatient Follow-up:  Follow up with PCP in 1-2 weeks. Please obtain BMP/CBC in one week. Advised to take Benadryl 25 mg as needed for itching or throat swelling. Advised to take prednisone 40 mg daily for 3 more days. Advised to follow-up with allergy specialist as scheduled.  Home Health:None Equipment/Devices:None  Discharge Condition: Stable CODE STATUS:Full code Diet recommendation: Heart Healthy   Brief [facility] Hospital Course: This 71 yrs old female  presenting to [facility] ED from home with husband on 02/14/22 with complaints of throat swelling.  The patient reported she felt as though she was developing a sore throat Sunday night, as if her throat was swelling, which resolved on its own without intervention. Then after dinner on Monday night 10/23, where she ate shrimp & salad (she has had both prior), she noticed her throat was swelling. She had muffled, hoarse speech per husband. She also confirmed shortness of breath, feeling as though she couldn't "get air out". They denied tongue swelling, chest pain, fever/ chills, & abdominal pain/ nausea/ vomiting/ diarrhea. Of note this is the 7th angioedema event since last Christmas 03/2021, at which time she was intubated and there was a concern this was ACE/ARB induced. She was taken off these medications at that time. She has been working with an allergist outpatient and there is a suspicion after further work up that she has an alpha gal allergy. The patient has been avoiding red meat because of this. She also reports needing to use her albuterol rescue inhaler daily due to wheezing and is not on a daily antihistamine. She is also awaiting a sleep study for possible OSA and recently starting working with  cardiology due to palpitations and runs of SVT on monitor device.  Husband is concerned because these events are occurring more frequently, with this visit her 3rd event this month. She is also in the process of trying to get connected with a new allergist. She denies any tobacco product use, she admits to drinking 4 vodka drinks nightly. Patient was admitted in the ICU for observation.  She was at risk for intubation given angioedema.  She was given Solu-Medrol, Pepcid, Benadryl in the ER.  Her symptoms has significantly improved during hospital course.  Patient remains on room air and has been ambulating in the ICU without any problems.  She stated throat swelling has resolved and wants to be discharged.  Discussed with PCCM and they agree with the discharge.  Patient is being discharged on prednisone and Benadryl as needed.  Angioedema resolved.  Discharge Diagnoses:  Principal Problem:   Angioedema    Discharge Instructions  Discharge Instructions     Call MD for:  difficulty breathing, headache or visual disturbances   Complete by: As directed    Call MD for:  persistant dizziness or light-headedness   Complete by: As directed    Call MD for:  persistant nausea and vomiting   Complete by: As directed    Diet - low sodium heart healthy   Complete by: As directed    Diet Carb Modified   Complete by: As directed    Discharge instructions   Complete by: As directed    Advised to follow-up with primary care physician in 1 week. Advised to take Benadryl 25 mg as needed for  itching or throat swelling. Advised to take prednisone 40 mg daily for 3 more days. Advised to follow-up with allergy specialist as scheduled.   Increase activity slowly   Complete by: As directed       Allergies as of 02/15/2022       Reactions   Cozaar [losartan] Swelling   Zinc Gelatin [zinc] Anaphylaxis   Alpha-gal    Amlodipine Besylate    REACTION: edema   Atorvastatin    REACTION: muscle pain    Beef Allergy    Cat Hair Extract    Dust Mite Extract    Neomycin-bacitracin Zn-polymyx    REACTION: rash Per pt not sure if from meds or not        Medication List     TAKE these medications    albuterol 108 (90 Base) MCG/ACT inhaler Commonly known as: ProAir HFA TAKE 2 PUFFS BY MOUTH EVERY 6 HOURS AS NEEDED FOR WHEEZE OR SHORTNESS OF BREATH   aspirin 81 MG tablet Take 81 mg by mouth daily.   calcium carbonate 750 MG chewable tablet Commonly known as: TUMS EX Chew 2 tablets by mouth at bedtime.   diphenhydrAMINE 25 MG tablet Commonly known as: Benadryl Allergy Take 1 tablet (25 mg total) by mouth every 6 (six) hours as needed.   EPINEPHrine 0.3 mg/0.3 mL Soaj injection Commonly known as: EPI-PEN Inject 0.3 mg into the muscle as needed for anaphylaxis.   fluticasone 50 MCG/ACT nasal spray Commonly known as: FLONASE USE 2 SPRAYS IN BOTH  NOSTRILS DAILY   folic acid 1 MG tablet Commonly known as: FOLVITE Take 1 tablet (1 mg total) by mouth daily. Start taking on: February 16, 2022   hydrALAZINE 25 MG tablet Commonly known as: APRESOLINE Take 0.5 tablets (12.5 mg total) by mouth every 8 (eight) hours.   hydrochlorothiazide 25 MG tablet Commonly known as: HYDRODIURIL Take 1 tablet (25 mg total) by mouth daily.   levothyroxine 50 MCG tablet Commonly known as: SYNTHROID TAKE 1 TABLET BY MOUTH DAILY  BEFORE BREAKFAST   metoprolol tartrate 25 MG tablet Commonly known as: LOPRESSOR Take 1 tablet (25 mg total) by mouth 2 (two) times daily.   oxybutynin 10 MG 24 hr tablet Commonly known as: DITROPAN-XL Take 1 tablet (10 mg total) by mouth daily.   pantoprazole 40 MG tablet Commonly known as: PROTONIX TAKE 1 TABLET BY MOUTH DAILY   predniSONE 20 MG tablet Commonly known as: DELTASONE Advised to take prednisone 40 mg for 3 days. What changed:  medication strength additional instructions   rosuvastatin 20 MG tablet Commonly known as: Crestor Take 1 tablet  (20 mg total) by mouth daily.   sertraline 100 MG tablet Commonly known as: ZOLOFT TAKE 1 TABLET BY MOUTH DAILY   thiamine 100 MG tablet Commonly known as: Vitamin B-1 Take 1 tablet (100 mg total) by mouth daily. Start taking on: February 16, 2022   Vitamin D 1000 units capsule Take 1,000 Units by mouth daily.        Follow-up Information     Tower, Wynelle Fanny, MD Follow up in 1 week(s).   Specialties: Family Medicine, Radiology Contact information: [place] [id number] [phone]                Allergies  Allergen Reactions   Cozaar [Losartan] Swelling   Zinc Gelatin [Zinc] Anaphylaxis   Alpha-Gal    Amlodipine Besylate     REACTION: edema   Atorvastatin  REACTION: muscle pain   Beef Allergy    Cat Hair Extract    Dust Mite Extract    Neomycin-Bacitracin Zn-Polymyx     REACTION: rash Per pt not sure if from meds or not    Consultations: PCCM   Procedures/Studies: No results found. None   Subjective: Patient was seen and examined at bedside.  Overnight events noted.   Patient reports feeling better,  throat swelling is resolved.  she wants to be discharged.  Patient being discharged home.  Discharge Exam: Vitals:   02/15/22 0900 02/15/22 1000  BP: (!) 144/95   Pulse: (!) 53 75  Resp: 13 (!) 21  Temp: 98.5 F (36.9 C)   SpO2: 97% 98%   Vitals:   02/15/22 0800 02/15/22 0822 02/15/22 0900 02/15/22 1000  BP: (!) 169/87  (!) 144/95   Pulse: 63 (!) 55 (!) 53 75  Resp: 12 13 13  (!) 21  Temp:   98.5 F (36.9 C)   TempSrc:   Oral   SpO2: 98% 96% 97% 98%  Weight:      Height:        General: Pt is alert, awake, not in acute distress Cardiovascular: RRR, S1/S2 +, no rubs, no gallops Respiratory: CTA bilaterally, no wheezing, no rhonchi Abdominal: Soft, NT, ND, bowel sounds + Extremities: no edema, no cyanosis    The results of significant diagnostics from this hospitalization (including imaging, microbiology,  ancillary and laboratory) are listed below for reference.     Microbiology: Recent Results (from the past 240 hour(s))  Microscopic Examination     Status: Abnormal   Collection Time: 02/07/22  2:34 PM   Urine  Result Value Ref Range Status   WBC, UA 11-30 (A) 0 - 5 /hpf Final   RBC, Urine 0-2 0 - 2 /hpf Final   Epithelial Cells (non renal) 0-10 0 - 10 /hpf Final   Casts Present (A) None seen /lpf Final   Cast Type Hyaline casts N/A Final   Mucus, UA Present (A) Not Estab. Final   Bacteria, UA Moderate (A) None seen/Few Final  CULTURE, URINE COMPREHENSIVE     Status: None   Collection Time: 02/07/22  4:47 PM   Specimen: Urine   Urine  Result Value Ref Range Status   Urine Culture, Comprehensive Final report  Final   Organism ID, Bacteria Comment  Final    Comment: Mixed urogenital flora 5,000  Colonies/mL   MRSA Next Gen by PCR, Nasal     Status: None   Collection Time: 02/14/22  4:37 AM   Specimen: Nasal Mucosa; Nasal Swab  Result Value Ref Range Status   MRSA by PCR Next Gen NOT DETECTED NOT DETECTED Final    Comment: (NOTE) The GeneXpert MRSA Assay (FDA approved for NASAL specimens only), is one component of a comprehensive MRSA colonization surveillance program. It is not intended to diagnose MRSA infection nor to guide or monitor treatment for MRSA infections. Test performance is not FDA approved in patients less than 76 years old. Performed at [facility], [address] [place], [place] [address]      Labs: BNP (last 3 results) Recent Labs    04/17/21 1933  BNP [id number]   Basic Metabolic Panel: Recent Labs  Lab 02/14/22 0055 02/14/22 0620 02/15/22 0708  NA 140 140 138  K 3.0* 3.2* 4.0  CL 105 108 109  CO2 25 22 22   GLUCOSE 118* 154* 135*  BUN 16 14 13   CREATININE 1.04*  0.77 0.85  CALCIUM 9.5 9.0 9.3  MG  --  1.8 2.3  PHOS  --  3.4 2.8   Liver Function Tests: Recent Labs  Lab 02/14/22 0620  AST 20  ALT 13  ALKPHOS 64  BILITOT 0.5   PROT 7.0  ALBUMIN 3.8   No results for input(s): "LIPASE", "AMYLASE" in the last 168 hours. No results for input(s): "AMMONIA" in the last 168 hours. CBC: Recent Labs  Lab 02/14/22 0055 02/14/22 0620 02/15/22 0708  WBC 8.6 5.9 10.4  HGB 13.6 12.7 11.6*  HCT 41.6 38.5 35.0*  MCV 96.3 94.6 93.6  PLT 245 152 243   Cardiac Enzymes: No results for input(s): "CKTOTAL", "CKMB", "CKMBINDEX", "TROPONINI" in the last 168 hours. BNP: Invalid input(s): "POCBNP" CBG: Recent Labs  Lab 02/14/22 1704 02/14/22 1941 02/14/22 2323 02/15/22 0337 02/15/22 0725  GLUCAP 154* 155* 109* 138* 135*   D-Dimer No results for input(s): "DDIMER" in the last 72 hours. Hgb A1c No results for input(s): "HGBA1C" in the last 72 hours. Lipid Profile No results for input(s): "CHOL", "HDL", "LDLCALC", "TRIG", "CHOLHDL", "LDLDIRECT" in the last 72 hours. Thyroid function studies No results for input(s): "TSH", "T4TOTAL", "T3FREE", "THYROIDAB" in the last 72 hours.  Invalid input(s): "FREET3" Anemia work up No results for input(s): "VITAMINB12", "FOLATE", "FERRITIN", "TIBC", "IRON", "RETICCTPCT" in the last 72 hours. Urinalysis    Component Value Date/Time   COLORURINE YELLOW 04/17/2021 1933   APPEARANCEUR Clear 02/07/2022 1434   LABSPEC >1.030 (H) 04/17/2021 1933   PHURINE 6.0 04/17/2021 1933   GLUCOSEU Negative 02/07/2022 1434   HGBUR NEGATIVE 04/17/2021 1933   HGBUR large 01/31/2007 1019   BILIRUBINUR Negative 02/07/2022 [place] 04/17/2021 1933   PROTEINUR Negative 02/07/2022 1434   PROTEINUR 30 (A) 04/17/2021 1933   UROBILINOGEN 0.2 12/09/2021 1553   UROBILINOGEN 0.2 01/31/2007 1019   NITRITE Negative 02/07/2022 1434   NITRITE NEGATIVE 04/17/2021 1933   LEUKOCYTESUR Trace (A) 02/07/2022 1434   LEUKOCYTESUR NEGATIVE 04/17/2021 1933   Sepsis Labs Recent Labs  Lab 02/14/22 0055 02/14/22 0620 02/15/22 0708  WBC 8.6 5.9 10.4   Microbiology Recent Results (from the  past 240 hour(s))  Microscopic Examination     Status: Abnormal   Collection Time: 02/07/22  2:34 PM   Urine  Result Value Ref Range Status   WBC, UA 11-30 (A) 0 - 5 /hpf Final   RBC, Urine 0-2 0 - 2 /hpf Final   Epithelial Cells (non renal) 0-10 0 - 10 /hpf Final   Casts Present (A) None seen /lpf Final   Cast Type Hyaline casts N/A Final   Mucus, UA Present (A) Not Estab. Final   Bacteria, UA Moderate (A) None seen/Few Final  CULTURE, URINE COMPREHENSIVE     Status: None   Collection Time: 02/07/22  4:47 PM   Specimen: Urine   Urine  Result Value Ref Range Status   Urine Culture, Comprehensive Final report  Final   Organism ID, Bacteria Comment  Final    Comment: Mixed urogenital flora 5,000  Colonies/mL   MRSA Next Gen by PCR, Nasal     Status: None   Collection Time: 02/14/22  4:37 AM   Specimen: Nasal Mucosa; Nasal Swab  Result Value Ref Range Status   MRSA by PCR Next Gen NOT DETECTED NOT DETECTED Final    Comment: (NOTE) The GeneXpert MRSA Assay (FDA approved for NASAL specimens only), is one component of a comprehensive MRSA colonization surveillance program. It  is not intended to diagnose MRSA infection nor to guide or monitor treatment for MRSA infections. Test performance is not FDA approved in patients less than 13 years old. Performed at [facility], [address], [place], [place] [address]      Time coordinating discharge: Over 30 minutes  SIGNED:   Shawna Clamp, MD  [facility] 02/15/2022, 3:55 PM Pager   If 7PM-7AM, please contact night-coverage

## 2022-02-15 NOTE — Care Management CC44 (Signed)
Condition Code 44 Documentation Completed  Patient Details  Name: Sharon [name] MRN: [id number] Date of Birth: November 26, 1950   Condition Code 44 given:  Yes Patient signature on Condition Code 44 notice:  Yes Documentation of 2 MD's agreement:  Yes Code 44 added to claim:  Yes    Shelbie Hutching, RN 02/15/2022, 11:32 AM

## 2022-02-15 NOTE — Care Management Obs Status (Signed)
[place] NOTIFICATION   Patient Details  Name: SAPPHIRA HARJO MRN: [id number] Date of Birth: 29-Jul-1950   Medicare Observation Status Notification Given:  Yes    Shelbie Hutching, RN 02/15/2022, 11:32 AM

## 2022-02-15 NOTE — TOC Initial Note (Signed)
Transition of Care [facility]) - Initial/Assessment Note    Patient Details  Name: Sharon [name] MRN: [id number] Date of Birth: [birth year]  Transition of Care [facility]) CM/SW Contact:    Shelbie Hutching, RN Phone Number: 02/15/2022, 10:28 AM  Clinical Narrative:                  Transition of Care [facility]) Screening Note   Patient Details  Name: Sharon [name] Date of Birth: 12-14-1950   Transition of Care [facility]) CM/SW Contact:    Shelbie Hutching, RN Phone Number: 02/15/2022, 10:28 AM    Transition of Care Department (TOC) has reviewed patient and no TOC needs have been identified at this time. We will continue to monitor patient advancement through interdisciplinary progression rounds. If new patient transition needs arise, please place a TOC consult.          Patient Goals and CMS Choice        Expected Discharge Plan and Services                                                Prior Living Arrangements/Services                       Activities of Daily Living Home Assistive Devices/Equipment: None ADL Screening (condition at time of admission) Patient's cognitive ability adequate to safely complete daily activities?: Yes Is the patient deaf or have difficulty hearing?: No Does the patient have difficulty seeing, even when wearing glasses/contacts?: No Does the patient have difficulty concentrating, remembering, or making decisions?: No Patient able to express need for assistance with ADLs?: Yes Does the patient have difficulty dressing or bathing?: No Independently performs ADLs?: Yes (appropriate for developmental age) Does the patient have difficulty walking or climbing stairs?: No Weakness of Legs: None Weakness of Arms/Hands: None  Permission Sought/Granted                  Emotional Assessment              Admission diagnosis:  Hypokalemia [E87.6] Angioedema [T78.3XXA] Angioedema, initial encounter [T78.3XXA] Patient  Active Problem List   Diagnosis Date Noted   Urinary incontinence 12/09/2021   Pedal edema 07/26/2021   Diarrhea 04/27/2021   Angioedema 04/17/2021   Dizziness 08/23/2020   Fatigue 08/23/2020   Elevated fasting glucose 12/17/2019   Medicare annual wellness visit, subsequent 08/28/2018   Cough 01/28/2018   Welcome to Medicare preventive visit 08/22/2016   Obesity 10/05/2014   Anemia 09/24/2013   Palpitations 03/03/2013   Colon cancer screening 03/03/2013   Hypokalemia 10/09/2011   Routine general medical examination at a health care facility 10/02/2011   Vitamin D deficiency disease 12/28/2010   ARTHRALGIA 08/19/2007   Hypothyroidism 01/31/2007   Hyperlipidemia 01/31/2007   Anxiety state 01/31/2007   Essential hypertension 01/31/2007   GERD 01/31/2007   PCP:  Abner Greenspan, MD Pharmacy:   [id number] [place], [place] [address] Phone: [phone] Fax: [phone]  [facility] ([place], [place] [facility] [address] [place] [address] [place] [address] Phone: [phone] Fax: [phone]  [place], [place] [address] Phone:  [phone] Fax: [phone]     Social Determinants of Health (SDOH) Interventions    Readmission Risk Interventions     No data to display

## 2022-02-16 LAB — TRYPTASE: Tryptase: 5.8 ug/L (ref 2.2–13.2)

## 2022-02-21 ENCOUNTER — Ambulatory Visit (INDEPENDENT_AMBULATORY_CARE_PROVIDER_SITE_OTHER): Payer: Medicare Other | Admitting: *Deleted

## 2022-02-21 DIAGNOSIS — Z0000 Encounter for general adult medical examination without abnormal findings: Secondary | ICD-10-CM

## 2022-02-21 NOTE — Patient Instructions (Signed)
Sharon [name] , Thank you for taking time to come for your Medicare Wellness Visit. I appreciate your ongoing commitment to your health goals. Please review the following plan we discussed and let me know if I can assist you in the future.   These are the goals we discussed:  Goals      Increase physical activity     Starting 08/27/2017, I will continue to attend [employer] for 60 minutes twice weekly.      Patient Stated     Would like to get strength back        This is a list of the screening recommended for you and due dates:  Health Maintenance  Topic Date Due   Zoster (Shingles) Vaccine (1 of 2) 03/11/2022*   Tetanus Vaccine  12/10/2022*   COVID-19 Vaccine (4 - Pfizer series) 03/24/2022   Medicare Annual Wellness Visit  02/22/2023   Mammogram  04/14/2023   Colon Cancer Screening  05/20/2023   Pneumonia Vaccine  Completed   Flu Shot  Completed   DEXA scan (bone density measurement)  Completed   Hepatitis C Screening: USPSTF Recommendation to screen - Ages 73-79 yo.  Completed   HPV Vaccine  Aged Out  *Topic was postponed. The date shown is not the original due date.    Advanced directives: not on file  Conditions/risks identified:     Preventive Care 24 Years and Older, Female Preventive care refers to lifestyle choices and visits with your health care provider that can promote health and wellness. What does preventive care include? A yearly physical exam. This is also called an annual well check. Dental exams once or twice a year. Routine eye exams. Ask your health care provider how often you should have your eyes checked. Personal lifestyle choices, including: Daily care of your teeth and gums. Regular physical activity. Eating a healthy diet. Avoiding tobacco and drug use. Limiting alcohol use. Practicing safe sex. Taking low-dose aspirin every day. Taking vitamin and mineral supplements as recommended by your health care provider. What happens during an  annual well check? The services and screenings done by your health care provider during your annual well check will depend on your age, overall health, lifestyle risk factors, and family history of disease. Counseling  Your health care provider may ask you questions about your: Alcohol use. Tobacco use. Drug use. Emotional well-being. Home and relationship well-being. Sexual activity. Eating habits. History of falls. Memory and ability to understand (cognition). Work and work Statistician. Reproductive health. Screening  You may have the following tests or measurements: Height, weight, and BMI. Blood pressure. Lipid and cholesterol levels. These may be checked every 5 years, or more frequently if you are over 45 years old. Skin check. Lung cancer screening. You may have this screening every year starting at age 31 if you have a 30-pack-year history of smoking and currently smoke or have quit within the past 15 years. Fecal occult blood test (FOBT) of the stool. You may have this test every year starting at age 91. Flexible sigmoidoscopy or colonoscopy. You may have a sigmoidoscopy every 5 years or a colonoscopy every 10 years starting at age 73. Hepatitis C blood test. Hepatitis B blood test. Sexually transmitted disease (STD) testing. Diabetes screening. This is done by checking your blood sugar (glucose) after you have not eaten for a while (fasting). You may have this done every 1-3 years. Bone density scan. This is done to screen for osteoporosis. You may have this done  starting at age 88. Mammogram. This may be done every 1-2 years. Talk to your health care provider about how often you should have regular mammograms. Talk with your health care provider about your test results, treatment options, and if necessary, the need for more tests. Vaccines  Your health care provider may recommend certain vaccines, such as: Influenza vaccine. This is recommended every year. Tetanus,  diphtheria, and acellular pertussis (Tdap, Td) vaccine. You may need a Td booster every 10 years. Zoster vaccine. You may need this after age 56. Pneumococcal 13-valent conjugate (PCV13) vaccine. One dose is recommended after age 16. Pneumococcal polysaccharide (PPSV23) vaccine. One dose is recommended after age 50. Talk to your health care provider about which screenings and vaccines you need and how often you need them. This information is not intended to replace advice given to you by your health care provider. Make sure you discuss any questions you have with your health care provider. Document Released: 05/07/2015 Document Revised: 12/29/2015 Document Reviewed: 02/09/2015 Elsevier Interactive Patient Education  2017 [place] Prevention in the Home Falls can cause injuries. They can happen to people of all ages. There are many things you can do to make your home safe and to help prevent falls. What can I do on the outside of my home? Regularly fix the edges of walkways and driveways and fix any cracks. Remove anything that might make you trip as you walk through a door, such as a raised step or threshold. Trim any bushes or trees on the path to your home. Use bright outdoor lighting. Clear any walking paths of anything that might make someone trip, such as rocks or tools. Regularly check to see if handrails are loose or broken. Make sure that both sides of any steps have handrails. Any raised decks and porches should have guardrails on the edges. Have any leaves, snow, or ice cleared regularly. Use sand or salt on walking paths during winter. Clean up any spills in your garage right away. This includes oil or grease spills. What can I do in the bathroom? Use night lights. Install grab bars by the toilet and in the tub and shower. Do not use towel bars as grab bars. Use non-skid mats or decals in the tub or shower. If you need to sit down in the shower, use a plastic,  non-slip stool. Keep the floor dry. Clean up any water that spills on the floor as soon as it happens. Remove soap buildup in the tub or shower regularly. Attach bath mats securely with double-sided non-slip rug tape. Do not have throw rugs and other things on the floor that can make you trip. What can I do in the bedroom? Use night lights. Make sure that you have a light by your bed that is easy to reach. Do not use any sheets or blankets that are too big for your bed. They should not hang down onto the floor. Have a firm chair that has side arms. You can use this for support while you get dressed. Do not have throw rugs and other things on the floor that can make you trip. What can I do in the kitchen? Clean up any spills right away. Avoid walking on wet floors. Keep items that you use a lot in easy-to-reach places. If you need to reach something above you, use a strong step stool that has a grab bar. Keep electrical cords out of the way. Do not use floor polish or wax that  makes floors slippery. If you must use wax, use non-skid floor wax. Do not have throw rugs and other things on the floor that can make you trip. What can I do with my stairs? Do not leave any items on the stairs. Make sure that there are handrails on both sides of the stairs and use them. Fix handrails that are broken or loose. Make sure that handrails are as long as the stairways. Check any carpeting to make sure that it is firmly attached to the stairs. Fix any carpet that is loose or worn. Avoid having throw rugs at the top or bottom of the stairs. If you do have throw rugs, attach them to the floor with carpet tape. Make sure that you have a light switch at the top of the stairs and the bottom of the stairs. If you do not have them, ask someone to add them for you. What else can I do to help prevent falls? Wear shoes that: Do not have high heels. Have rubber bottoms. Are comfortable and fit you well. Are closed  at the toe. Do not wear sandals. If you use a stepladder: Make sure that it is fully opened. Do not climb a closed stepladder. Make sure that both sides of the stepladder are locked into place. Ask someone to hold it for you, if possible. Clearly mark and make sure that you can see: Any grab bars or handrails. First and last steps. Where the edge of each step is. Use tools that help you move around (mobility aids) if they are needed. These include: Canes. Walkers. Scooters. Crutches. Turn on the lights when you go into a dark area. Replace any light bulbs as soon as they burn out. Set up your furniture so you have a clear path. Avoid moving your furniture around. If any of your floors are uneven, fix them. If there are any pets around you, be aware of where they are. Review your medicines with your doctor. Some medicines can make you feel dizzy. This can increase your chance of falling. Ask your doctor what other things that you can do to help prevent falls. This information is not intended to replace advice given to you by your health care provider. Make sure you discuss any questions you have with your health care provider. Document Released: 02/04/2009 Document Revised: 09/16/2015 Document Reviewed: 05/15/2014 Elsevier Interactive Patient Education  2017 [employer].

## 2022-02-21 NOTE — Progress Notes (Signed)
Subjective:   TNYA ADES is a 71 y.o. female who presents for Medicare Annual (Subsequent) preventive examination.  I connected with  [name] on 02/21/22 by a telephone enabled telemedicine application and verified that I am speaking with the correct person using two identifiers.   I discussed the limitations of evaluation and management by telemedicine. The patient expressed understanding and agreed to proceed.  Patient location: home  Provider location: Tele-health-home    Review of Systems     Cardiac Risk Factors include: advanced age (>66men, >15 women);hypertension;obesity (BMI >30kg/m2)     Objective:    Today's Vitals   02/21/22 1134  PainSc: 2    There is no height or weight on file to calculate BMI.     02/21/2022   11:36 AM 02/14/2022    4:40 AM 02/03/2022    2:10 PM 01/25/2022    8:52 AM 11/28/2021    3:31 PM 06/11/2021    6:26 PM 04/27/2021    9:35 PM  Advanced Directives  Does Patient Have a Medical Advance Directive? Yes Yes No No No No No  Type of Advance Directive Healthcare Power of Attorney Living will       Does patient want to make changes to medical advance directive? No - Patient declined        Copy of Healthcare Power of Attorney in Chart? No - copy requested        Would patient like information on creating a medical advance directive?  No - Patient declined No - Patient declined No - Patient declined No - Patient declined  No - Patient declined    Current Medications (verified) Outpatient Encounter Medications as of 02/21/2022  Medication Sig   albuterol (PROAIR HFA) 108 (90 Base) MCG/ACT inhaler TAKE 2 PUFFS BY MOUTH EVERY 6 HOURS AS NEEDED FOR WHEEZE OR SHORTNESS OF BREATH   aspirin 81 MG tablet Take 81 mg by mouth daily.   calcium carbonate (TUMS EX) 750 MG chewable tablet Chew 2 tablets by mouth at bedtime.   Cholecalciferol (VITAMIN D) 1000 UNITS capsule Take 1,000 Units by mouth daily.   diphenhydrAMINE (BENADRYL  ALLERGY) 25 MG tablet Take 1 tablet (25 mg total) by mouth every 6 (six) hours as needed.   EPINEPHrine 0.3 mg/0.3 mL IJ SOAJ injection Inject 0.3 mg into the muscle as needed for anaphylaxis.   fluticasone (FLONASE) 50 MCG/ACT nasal spray USE 2 SPRAYS IN BOTH  NOSTRILS DAILY   folic acid (FOLVITE) 1 MG tablet Take 1 tablet (1 mg total) by mouth daily.   hydrALAZINE (APRESOLINE) 25 MG tablet Take 0.5 tablets (12.5 mg total) by mouth every 8 (eight) hours.   hydrochlorothiazide (HYDRODIURIL) 25 MG tablet Take 1 tablet (25 mg total) by mouth daily.   levothyroxine (SYNTHROID) 50 MCG tablet TAKE 1 TABLET BY MOUTH DAILY  BEFORE BREAKFAST   metoprolol tartrate (LOPRESSOR) 25 MG tablet Take 1 tablet (25 mg total) by mouth 2 (two) times daily.   oxybutynin (DITROPAN-XL) 10 MG 24 hr tablet Take 1 tablet (10 mg total) by mouth daily.   pantoprazole (PROTONIX) 40 MG tablet TAKE 1 TABLET BY MOUTH DAILY   rosuvastatin (CRESTOR) 20 MG tablet Take 1 tablet (20 mg total) by mouth daily.   sertraline (ZOLOFT) 100 MG tablet TAKE 1 TABLET BY MOUTH DAILY   thiamine (VITAMIN B-1) 100 MG tablet Take 1 tablet (100 mg total) by mouth daily.   predniSONE (DELTASONE) 20 MG tablet Advised to take prednisone 40  mg for 3 days. (Patient not taking: Reported on 02/21/2022)   No facility-administered encounter medications on file as of 02/21/2022.    Allergies (verified) Cozaar [losartan], Zinc gelatin [zinc], Alpha-gal, Amlodipine besylate, Atorvastatin, Beef allergy, Cat hair extract, Dust mite extract, and Neomycin-bacitracin zn-polymyx   History: Past Medical History:  Diagnosis Date   Angio-edema    Anxiety    Cataract    GERD (gastroesophageal reflux disease)    Hyperlipidemia    Hypertension    Hypothyroidism    Vitamin D deficiency    Past Surgical History:  Procedure Laterality Date   CATARACT EXTRACTION W/ INTRAOCULAR LENS IMPLANT Right 12/29/2016   Dr. Darleen Crocker   CATARACT EXTRACTION W/  INTRAOCULAR LENS IMPLANT Left 01/15/2017   Dr. Darleen Crocker   COLONOSCOPY     LEEP     TUBAL LIGATION     VEIN LIGATION AND STRIPPING     x2   Family History  Problem Relation Age of Onset   Transient ischemic attack Mother    Hypertension Mother    Hypertension Father    Colon cancer Neg Hx    Esophageal cancer Neg Hx    Rectal cancer Neg Hx    Stomach cancer Neg Hx    Social History   Socioeconomic History   Marital status: Married    Spouse name: Not on file   Number of children: Not on file   Years of education: Not on file   Highest education level: Not on file  Occupational History   Not on file  Tobacco Use   Smoking status: Former    Types: Cigarettes    Quit date: 04/24/1996    Years since quitting: 25.8   Smokeless tobacco: Never  Vaping Use   Vaping Use: Never used  Substance and Sexual Activity   Alcohol use: Yes    Alcohol/week: 4.0 standard drinks of alcohol    Types: 4 Shots of liquor per week    Comment: occasional   Drug use: No   Sexual activity: Not Currently  Other Topics Concern   Not on file  Social History Narrative   Not on file   Social Determinants of Health   Financial Resource Strain: Low Risk  (02/21/2022)   Overall Financial Resource Strain (CARDIA)    Difficulty of Paying Living Expenses: Not hard at all  Food Insecurity: No Food Insecurity (02/21/2022)   Hunger Vital Sign    Worried About Running Out of Food in the Last Year: Never true    Ran Out of Food in the Last Year: Never true  Transportation Needs: No Transportation Needs (02/21/2022)   PRAPARE - Hydrologist (Medical): No    Lack of Transportation (Non-Medical): No  Physical Activity: Insufficiently Active (02/21/2022)   Exercise Vital Sign    Days of Exercise per Week: 5 days    Minutes of Exercise per Session: 10 min  Stress: No Stress Concern Present (02/21/2022)   [place]    Feeling of Stress : Not at all  Social Connections: Socially Isolated (02/21/2022)   Social Connection and Isolation Panel [NHANES]    Frequency of Communication with Friends and Family: Once a week    Frequency of Social Gatherings with Friends and Family: Never    Attends Religious Services: Never    Marine scientist or Organizations: No    Attends Archivist Meetings: Never  Marital Status: Married    Tobacco Counseling Counseling given: Not Answered   Clinical Intake:  Pre-visit preparation completed: Yes  Pain : 0-10 Pain Score: 2  Pain Location: Back Pain Descriptors / Indicators: Burning, Aching, Discomfort, Dull Pain Onset: More than a month ago Pain Frequency: Intermittent     Diabetes: No  How often do you need to have someone help you when you read instructions, pamphlets, or other written materials from your doctor or pharmacy?: 1 - Never  Diabetic?  no  Interpreter Needed?: No  Information entered by :: Remi HaggardJulie Lila Lufkin LPN   Activities of Daily Living    02/21/2022   11:52 AM 02/14/2022    4:40 AM  In your present state of health, do you have any difficulty performing the following activities:  Hearing? 0 0  Vision? 0 0  Difficulty concentrating or making decisions? 0 0  Walking or climbing stairs? 0 0  Dressing or bathing? 0 0  Doing errands, shopping? 0 0  Preparing Food and eating ? N   Using the Toilet? N   In the past six months, have you accidently leaked urine? Y   Do you have problems with loss of bowel control? N   Managing your Medications? N   Managing your Finances? N   Housekeeping or managing your Housekeeping? N     Patient Care Team: [place], Audrie GallusMarne A, MD as PCP - General  Indicate any recent Medical Services you may have received from other than [facility] providers in the past year (date may be approximate).     Assessment:   This is a routine wellness examination for [place].  Hearing/Vision  screen Hearing Screening - Comments:: No trouble hearing Vision Screening - Comments:: Not up date  Dietary issues and exercise activities discussed: Current Exercise Habits: Home exercise routine, Type of exercise: walking, Time (Minutes): 20, Frequency (Times/Week): 5, Weekly Exercise (Minutes/Week): 100, Intensity: Mild   Goals Addressed             This Visit's Progress    Patient Stated       Would like to get strength back       Depression Screen    02/21/2022   11:42 AM 12/17/2019    3:33 PM 08/28/2018    7:57 PM 08/27/2017   12:26 PM 08/22/2016   11:28 AM 01/24/2016   11:12 AM  PHQ 2/9 Scores  PHQ - 2 Score 0 0 0 0 0 0  PHQ- 9 Score 3 2  0      Fall Risk    02/21/2022   11:37 AM 12/17/2019    2:17 PM 08/27/2017   12:26 PM 08/22/2016   11:28 AM 01/24/2016   11:12 AM  Fall Risk   Falls in the past year? 0 0 No Yes Yes  Number falls in past yr: 0 0  1 1  Injury with Fall? 0 0  Yes No  Follow up Falls evaluation completed;Education provided;Falls prevention discussed        FALL RISK PREVENTION PERTAINING TO THE HOME:  Any stairs in or around the home? Yes  If so, are there any without handrails? No  Home free of loose throw rugs in walkways, pet beds, electrical cords, etc? Yes  Adequate lighting in your home to reduce risk of falls? Yes   ASSISTIVE DEVICES UTILIZED TO PREVENT FALLS:  Life alert? No  Use of a cane, walker or w/c? No  Grab bars in the bathroom? Yes  Shower  chair or bench in shower? No  Elevated toilet seat or a handicapped toilet? Yes   TIMED UP AND GO:  Was the test performed? No .    Cognitive Function:    08/27/2017   12:29 PM  MMSE - Mini Mental State Exam  Orientation to time 5  Orientation to Place 5  Registration 3  Attention/ Calculation 0  Recall 3  Language- name 2 objects 0  Language- repeat 1  Language- follow 3 step command 3  Language- read & follow direction 0  Write a sentence 0  Copy design 0  Total score 20         02/21/2022   11:38 AM  6CIT Screen  What Year? 0 points  What month? 0 points  What time? 0 points  Count back from 20 0 points  Months in reverse 0 points  Repeat phrase 0 points  Total Score 0 points    Immunizations Immunization History  Administered Date(s) Administered   Fluad Quad(high Dose 65+) 01/24/2019   Influenza Split 03/29/2011, 03/12/2012   Influenza, High Dose Seasonal PF 01/27/2022   Influenza,inj,Quad PF,6+ Mos 03/03/2013, 03/25/2014, 01/24/2016, 02/08/2017, 01/28/2018   Influenza-Unspecified 01/31/2021   PFIZER Comirnaty(Gray Top)Covid-19 Tri-Sucrose Vaccine 01/27/2022   PFIZER(Purple Top)SARS-COV-2 Vaccination 06/08/2019, 07/01/2019   Pneumococcal Conjugate-13 08/22/2016   Pneumococcal Polysaccharide-23 08/27/2017   Td 04/24/1998   Zoster, Live 04/10/2013    TDAP status: Due, Education has been provided regarding the importance of this vaccine. Advised may receive this vaccine at local pharmacy or Health Dept. Aware to provide a copy of the vaccination record if obtained from local pharmacy or Health Dept. Verbalized acceptance and understanding.  Flu Vaccine status: Up to date  Pneumococcal vaccine status: Up to date  Covid-19 vaccine status: Completed vaccines  Qualifies for Shingles Vaccine? Yes   Zostavax completed Yes   Shingrix Completed?: No.    Education has been provided regarding the importance of this vaccine. Patient has been advised to call insurance company to determine out of pocket expense if they have not yet received this vaccine. Advised may also receive vaccine at local pharmacy or Health Dept. Verbalized acceptance and understanding.  Screening Tests Health Maintenance  Topic Date Due   Zoster Vaccines- Shingrix (1 of 2) 03/11/2022 (Originally 01/11/2001)   TETANUS/TDAP  12/10/2022 (Originally 10/08/2020)   COVID-19 Vaccine (4 - Pfizer series) 03/24/2022   Medicare Annual Wellness (AWV)  02/22/2023   MAMMOGRAM  04/14/2023    COLONOSCOPY (Pts 45-56yrs Insurance coverage will need to be confirmed)  05/20/2023   Pneumonia Vaccine 55+ Years old  Completed   INFLUENZA VACCINE  Completed   DEXA SCAN  Completed   Hepatitis C Screening  Completed   HPV VACCINES  Aged Out    Health Maintenance  There are no preventive care reminders to display for this patient.   Colorectal cancer screening: Type of screening: Colonoscopy. Completed 2015. Repeat every 10 years  Mammogram status: Completed 03-2021. Repeat every year  Bone Density status: Completed 2019. Results reflect: Bone density results: NORMAL. Repeat every 5 years.  Lung Cancer Screening: (Low Dose CT Chest recommended if Age 31-80 years, 30 pack-year currently smoking OR have quit w/in 15years.) does not qualify.   Lung Cancer Screening Referral:   Additional Screening:  Hepatitis C Screening: does not qualify; Completed 2022  Vision Screening: Recommended annual ophthalmology exams for early detection of glaucoma and other disorders of the eye. Is the patient up to date with their annual eye exam?  No  Who is the provider or what is the name of the office in which the patient attends annual eye exams?  If pt is not established with a provider, would they like to be referred to a provider to establish care? No .   Dental Screening: Recommended annual dental exams for proper oral hygiene  Community Resource Referral / Chronic Care Management: CRR required this visit?  No   CCM required this visit?  No      Plan:     I have personally reviewed and noted the following in the patient's chart:   Medical and social history Use of alcohol, tobacco or illicit drugs  Current medications and supplements including opioid prescriptions. Patient is not currently taking opioid prescriptions. Functional ability and status Nutritional status Physical activity Advanced directives List of other physicians Hospitalizations, surgeries, and ER visits in  previous 12 months Vitals Screenings to include cognitive, depression, and falls Referrals and appointments  In addition, I have reviewed and discussed with patient certain preventive protocols, quality metrics, and best practice recommendations. A written personalized care plan for preventive services as well as general preventive health recommendations were provided to patient.     Remi Haggard, LPN   00/92/3300   Nurse Notes:

## 2022-02-22 ENCOUNTER — Encounter: Payer: Self-pay | Admitting: Family Medicine

## 2022-02-22 ENCOUNTER — Ambulatory Visit (INDEPENDENT_AMBULATORY_CARE_PROVIDER_SITE_OTHER): Payer: Medicare Other | Admitting: Family Medicine

## 2022-02-22 VITALS — BP 141/88 | HR 111 | Temp 97.3°F | Ht 66.0 in | Wt 210.0 lb

## 2022-02-22 DIAGNOSIS — I471 Supraventricular tachycardia, unspecified: Secondary | ICD-10-CM | POA: Diagnosis not present

## 2022-02-22 DIAGNOSIS — T783XXD Angioneurotic edema, subsequent encounter: Secondary | ICD-10-CM | POA: Diagnosis not present

## 2022-02-22 DIAGNOSIS — K219 Gastro-esophageal reflux disease without esophagitis: Secondary | ICD-10-CM | POA: Diagnosis not present

## 2022-02-22 DIAGNOSIS — I10 Essential (primary) hypertension: Secondary | ICD-10-CM

## 2022-02-22 NOTE — Assessment & Plan Note (Signed)
Continues protonix 40  Per pt well controlled  This does not seem to add to her throat swelling but has caused cough in the past

## 2022-02-22 NOTE — Assessment & Plan Note (Addendum)
Another episode with hospitalization Reviewed hospital records, lab results and studies in detail  Did well with IV solumedrol and benadryl  Finishing course of pred now  Nl exam today  Has new allergist appt 11/10  Will avoid beef/pork, red meat and gelatin  Also shellfish (was exp to this before last episode) Has epi pen Will continue to monitor  Addendum- noted ER report said pt drank 4 vodka drinks per day and she said this is incorrect  States she drinks 2 vodka drinks per week

## 2022-02-22 NOTE — Progress Notes (Signed)
Subjective:    Patient ID: Sharon [name], female    DOB: Nov 24, 1950, 71 y.o.   MRN: [id number]  HPI Pt presents for f/u of hospitalization   Wt Readings from Last 3 Encounters:  02/22/22 210 lb (95.3 kg)  02/15/22 214 lb 11.7 oz (97.4 kg)  02/07/22 209 lb (94.8 kg)   33.89 kg/m   Hosp from 10/24 to 10/25 for angioedema  Felt throat swelling after shrimp and salad for dinner on 10/23  Then sob  Was admitted to ICU for observation  Tx with solu medrol, pepcid , benadryl   Discharged with prednisone and benadryl as needed   3rd episode in a month   In process of getting a new allergist  ? Of alpha gal allergy in the past   An app with allergy on the 10 th   Husband is sick, she is taking care of him   Has questions about her medications    Etoh intake : she has 2 drinks per week - vodka   ER note said 4 drinks per day (she denies this and says it is a mistake)      Lab Results  Component Value Date   CREATININE 0.85 02/15/2022   BUN 13 02/15/2022   NA 138 02/15/2022   K 4.0 02/15/2022   CL 109 02/15/2022   CO2 22 02/15/2022   Lab Results  Component Value Date   ALT 13 02/14/2022   AST 20 02/14/2022   ALKPHOS 64 02/14/2022   BILITOT 0.5 02/14/2022   Glucose 135  Lab Results  Component Value Date   WBC 10.4 02/15/2022   HGB 11.6 (L) 02/15/2022   HCT 35.0 (L) 02/15/2022   MCV 93.6 02/15/2022   PLT 243 02/15/2022    HTN bp is stable today  No cp or palpitations or headaches or edema  No side effects to medicines  BP Readings from Last 3 Encounters:  02/22/22 (!) 140/94  02/15/22 (!) 144/95  02/07/22 134/89    Hydralazine 12.5 mg tid - hard to get in all 3 doses  Hctz 25 mg  Metoprolol 25 mg bid  (needs to get back on metoprolol)   Pulse Readings from Last 3 Encounters:  02/22/22 (!) 111  02/15/22 75  02/07/22 74   Doubt she is drinking enough fluids   Patient Active Problem List   Diagnosis Date Noted   Urinary incontinence  12/09/2021   Pedal edema 07/26/2021   Diarrhea 04/27/2021   Angioedema 04/17/2021   Dizziness 08/23/2020   Fatigue 08/23/2020   Elevated fasting glucose 12/17/2019   Medicare annual wellness visit, subsequent 08/28/2018   Cough 01/28/2018   Welcome to Medicare preventive visit 08/22/2016   Obesity 10/05/2014   Anemia 09/24/2013   Palpitations 03/03/2013   Colon cancer screening 03/03/2013   Hypokalemia 10/09/2011   Routine general medical examination at a health care facility 10/02/2011   Vitamin D deficiency disease 12/28/2010   ARTHRALGIA 08/19/2007   Hypothyroidism 01/31/2007   Hyperlipidemia 01/31/2007   Anxiety state 01/31/2007   Essential hypertension 01/31/2007   GERD 01/31/2007   Past Medical History:  Diagnosis Date   Angio-edema    Anxiety    Cataract    GERD (gastroesophageal reflux disease)    Hyperlipidemia    Hypertension    Hypothyroidism    Vitamin D deficiency    Past Surgical History:  Procedure Laterality Date   CATARACT EXTRACTION W/ INTRAOCULAR LENS IMPLANT Right 12/29/2016   Dr. Mia Creek  CATARACT EXTRACTION W/ INTRAOCULAR LENS IMPLANT Left 01/15/2017   Dr. Mia Creek   COLONOSCOPY     LEEP     TUBAL LIGATION     VEIN LIGATION AND STRIPPING     x2   Social History   Tobacco Use   Smoking status: Former    Types: Cigarettes    Quit date: 04/24/1996    Years since quitting: 25.8   Smokeless tobacco: Never  Vaping Use   Vaping Use: Never used  Substance Use Topics   Alcohol use: Yes    Alcohol/week: 4.0 standard drinks of alcohol    Types: 4 Shots of liquor per week    Comment: occasional   Drug use: No   Family History  Problem Relation Age of Onset   Transient ischemic attack Mother    Hypertension Mother    Hypertension Father    Colon cancer Neg Hx    Esophageal cancer Neg Hx    Rectal cancer Neg Hx    Stomach cancer Neg Hx    Allergies  Allergen Reactions   Cozaar [Losartan] Swelling   Zinc Gelatin [Zinc]  Anaphylaxis   Alpha-Gal    Amlodipine Besylate     REACTION: edema   Atorvastatin     REACTION: muscle pain   Beef Allergy    Cat Hair Extract    Dust Mite Extract    Neomycin-Bacitracin Zn-Polymyx     REACTION: rash Per pt not sure if from meds or not   Current Outpatient Medications on File Prior to Visit  Medication Sig Dispense Refill   albuterol (PROAIR HFA) 108 (90 Base) MCG/ACT inhaler TAKE 2 PUFFS BY MOUTH EVERY 6 HOURS AS NEEDED FOR WHEEZE OR SHORTNESS OF BREATH 8.5 each 2   aspirin 81 MG tablet Take 81 mg by mouth daily.     calcium carbonate (TUMS EX) 750 MG chewable tablet Chew 2 tablets by mouth at bedtime.     Cholecalciferol (VITAMIN D) 1000 UNITS capsule Take 1,000 Units by mouth daily.     diphenhydrAMINE (BENADRYL ALLERGY) 25 MG tablet Take 1 tablet (25 mg total) by mouth every 6 (six) hours as needed. 10 tablet 0   EPINEPHrine 0.3 mg/0.3 mL IJ SOAJ injection Inject 0.3 mg into the muscle as needed for anaphylaxis. 1 each 1   fluticasone (FLONASE) 50 MCG/ACT nasal spray USE 2 SPRAYS IN BOTH  NOSTRILS DAILY 48 g 1   folic acid (FOLVITE) 1 MG tablet Take 1 tablet (1 mg total) by mouth daily. 30 tablet 1   hydrALAZINE (APRESOLINE) 25 MG tablet Take 0.5 tablets (12.5 mg total) by mouth every 8 (eight) hours. 10 tablet 0   hydrochlorothiazide (HYDRODIURIL) 25 MG tablet Take 1 tablet (25 mg total) by mouth daily. 90 tablet 1   levothyroxine (SYNTHROID) 50 MCG tablet TAKE 1 TABLET BY MOUTH DAILY  BEFORE BREAKFAST 90 tablet 3   metoprolol tartrate (LOPRESSOR) 25 MG tablet Take 1 tablet (25 mg total) by mouth 2 (two) times daily. 180 tablet 3   oxybutynin (DITROPAN-XL) 10 MG 24 hr tablet Take 1 tablet (10 mg total) by mouth daily. 60 tablet 0   pantoprazole (PROTONIX) 40 MG tablet TAKE 1 TABLET BY MOUTH DAILY 90 tablet 3   predniSONE (DELTASONE) 20 MG tablet Advised to take prednisone 40 mg for 3 days. 6 tablet 0   rosuvastatin (CRESTOR) 20 MG tablet Take 1 tablet (20 mg total)  by mouth daily. 90 tablet 3   sertraline (ZOLOFT) 100 MG  tablet TAKE 1 TABLET BY MOUTH DAILY 90 tablet 3   thiamine (VITAMIN B-1) 100 MG tablet Take 1 tablet (100 mg total) by mouth daily. 30 tablet 1   No current facility-administered medications on file prior to visit.     Review of Systems  Constitutional:  Positive for fatigue. Negative for activity change, appetite change, fever and unexpected weight change.  HENT:  Negative for congestion, ear pain, rhinorrhea, sinus pressure and sore throat.   Eyes:  Negative for pain, redness and visual disturbance.  Respiratory:  Negative for cough, shortness of breath and wheezing.   Cardiovascular:  Negative for chest pain and palpitations.  Gastrointestinal:  Negative for abdominal pain, blood in stool, constipation and diarrhea.  Endocrine: Negative for polydipsia and polyuria.  Genitourinary:  Negative for dysuria, frequency and urgency.  Musculoskeletal:  Negative for arthralgias, back pain and myalgias.  Skin:  Negative for pallor and rash.  Allergic/Immunologic: Negative for environmental allergies.  Neurological:  Negative for dizziness, syncope and headaches.       Feels off balanced and mentally foggy when on prednisone  Also takes benadryl prn   Hematological:  Negative for adenopathy. Does not bruise/bleed easily.  Psychiatric/Behavioral:  Negative for decreased concentration and dysphoric mood. The patient is not nervous/anxious.        Objective:   Physical Exam Constitutional:      General: She is not in acute distress.    Appearance: Normal appearance. She is well-developed. She is obese. She is not ill-appearing or diaphoretic.  HENT:     Head: Normocephalic and atraumatic.     Right Ear: Tympanic membrane and ear canal normal.     Left Ear: Tympanic membrane and ear canal normal.     Nose: No congestion.     Mouth/Throat:     Mouth: Mucous membranes are moist.     Pharynx: Oropharynx is clear. No oropharyngeal  exudate or posterior oropharyngeal erythema.     Comments: No mouth, tongue or throat swelling  Eyes:     General: No scleral icterus.       Right eye: No discharge.        Left eye: No discharge.     Conjunctiva/sclera: Conjunctivae normal.     Pupils: Pupils are equal, round, and reactive to light.  Neck:     Thyroid: No thyromegaly.     Vascular: No carotid bruit or JVD.  Cardiovascular:     Rate and Rhythm: Regular rhythm. Tachycardia present.     Heart sounds: Normal heart sounds.     No gallop.  Pulmonary:     Effort: Pulmonary effort is normal. No respiratory distress.     Breath sounds: Normal breath sounds. No wheezing or rales.  Abdominal:     General: There is no distension or abdominal bruit.     Palpations: Abdomen is soft.  Musculoskeletal:     Cervical back: Normal range of motion and neck supple.     Right lower leg: No edema.     Left lower leg: No edema.  Lymphadenopathy:     Cervical: No cervical adenopathy.  Skin:    General: Skin is warm and dry.     Coloration: Skin is not pale.     Findings: No rash.  Neurological:     Mental Status: She is alert.     Coordination: Coordination normal.     Deep Tendon Reflexes: Reflexes are normal and symmetric. Reflexes normal.  Psychiatric:  Mood and Affect: Mood normal.           Assessment & Plan:   Problem List Items Addressed This Visit       Cardiovascular and Mediastinum   Essential hypertension    bp is close to goal bp in fair control at this time  BP Readings from Last 1 Encounters:  02/22/22 (!) 141/88   Most recent labs reviewed  Disc lifstyle change with low sodium diet and exercise  Enc to re start metoprolol 25 mg bid that she stopped or ran out of (from cardiology) Continue hctz 25 mg daily  Hydralazine 12.5 mg tid- will make effort to be more compliant with this (disc plan to take at 7 am, 3 pm and 11 pm) Will continue to follow       SVT (supraventricular tachycardia)     Episodic, found in cardiology w/u for palpitations  Supposed to take metoprolol for this  Pulse 111 today , nl rhythm  Enc her to re start and f/u with cardiology as planned        Digestive   GERD    Continues protonix 40  Per pt well controlled  This does not seem to add to her throat swelling but has caused cough in the past         Other   Angioedema - Primary    Another episode with hospitalization Reviewed hospital records, lab results and studies in detail  Did well with IV solumedrol and benadryl  Finishing course of pred now  Nl exam today  Has new allergist appt 11/10  Will avoid beef/pork, red meat and gelatin  Also shellfish (was exp to this before last episode) Has epi pen Will continue to monitor  Addendum- noted ER report said pt drank 4 vodka drinks per day and she said this is incorrect  States she drinks 2 vodka drinks per week

## 2022-02-22 NOTE — Assessment & Plan Note (Signed)
Episodic, found in cardiology w/u for palpitations  Supposed to take metoprolol for this  Pulse 111 today , nl rhythm  Enc her to re start and f/u with cardiology as planned

## 2022-02-22 NOTE — Patient Instructions (Addendum)
Take your hydralazine at 11 pm, 7 am (set alarm, you can go back to sleep), then 3 pm  That way you get it every 8 hours   Make sure you get back on the metoprolol twice daily  This should help your blood pressure more  Call cardiology for refill    Go ahead and finish the prednisone   Make sure you drink regular fluids   Continue avoiding beef and pork  Continue avoiding shellfish  Avoid gelatin as you were told    Get more protein from non meat choices for now   Continue current medicines  Follow up with your new allergist on the 10th as planned

## 2022-02-22 NOTE — Assessment & Plan Note (Signed)
bp is close to goal bp in fair control at this time  BP Readings from Last 1 Encounters:  02/22/22 (!) 141/88   Most recent labs reviewed  Disc lifstyle change with low sodium diet and exercise  Enc to re start metoprolol 25 mg bid that she stopped or ran out of (from cardiology) Continue hctz 25 mg daily  Hydralazine 12.5 mg tid- will make effort to be more compliant with this (disc plan to take at 7 am, 3 pm and 11 pm) Will continue to follow

## 2022-02-26 ENCOUNTER — Other Ambulatory Visit: Payer: Self-pay

## 2022-02-26 ENCOUNTER — Emergency Department: Payer: Medicare Other

## 2022-02-26 ENCOUNTER — Inpatient Hospital Stay
Admission: EM | Admit: 2022-02-26 | Discharge: 2022-02-28 | DRG: 915 | Disposition: A | Discharge status: Home / Self Care | Payer: Medicare Other | Attending: Internal Medicine | Admitting: Internal Medicine

## 2022-02-26 DIAGNOSIS — Z87891 Personal history of nicotine dependence: Secondary | ICD-10-CM | POA: Diagnosis not present

## 2022-02-26 DIAGNOSIS — Z6832 Body mass index (BMI) 32.0-32.9, adult: Secondary | ICD-10-CM

## 2022-02-26 DIAGNOSIS — R7303 Prediabetes: Secondary | ICD-10-CM | POA: Diagnosis present

## 2022-02-26 DIAGNOSIS — T464X5A Adverse effect of angiotensin-converting-enzyme inhibitors, initial encounter: Secondary | ICD-10-CM | POA: Diagnosis present

## 2022-02-26 DIAGNOSIS — I10 Essential (primary) hypertension: Secondary | ICD-10-CM | POA: Diagnosis not present

## 2022-02-26 DIAGNOSIS — Z79899 Other long term (current) drug therapy: Secondary | ICD-10-CM | POA: Diagnosis not present

## 2022-02-26 DIAGNOSIS — Z7982 Long term (current) use of aspirin: Secondary | ICD-10-CM | POA: Diagnosis not present

## 2022-02-26 DIAGNOSIS — E669 Obesity, unspecified: Secondary | ICD-10-CM | POA: Diagnosis present

## 2022-02-26 DIAGNOSIS — J9601 Acute respiratory failure with hypoxia: Secondary | ICD-10-CM | POA: Diagnosis present

## 2022-02-26 DIAGNOSIS — Z91014 Allergy to mammalian meats: Secondary | ICD-10-CM

## 2022-02-26 DIAGNOSIS — Z8249 Family history of ischemic heart disease and other diseases of the circulatory system: Secondary | ICD-10-CM | POA: Diagnosis not present

## 2022-02-26 DIAGNOSIS — Z79890 Hormone replacement therapy (postmenopausal): Secondary | ICD-10-CM

## 2022-02-26 DIAGNOSIS — E785 Hyperlipidemia, unspecified: Secondary | ICD-10-CM | POA: Diagnosis present

## 2022-02-26 DIAGNOSIS — R220 Localized swelling, mass and lump, head: Secondary | ICD-10-CM

## 2022-02-26 DIAGNOSIS — E039 Hypothyroidism, unspecified: Secondary | ICD-10-CM | POA: Diagnosis present

## 2022-02-26 DIAGNOSIS — T380X5A Adverse effect of glucocorticoids and synthetic analogues, initial encounter: Secondary | ICD-10-CM | POA: Diagnosis not present

## 2022-02-26 DIAGNOSIS — T7840XA Allergy, unspecified, initial encounter: Principal | ICD-10-CM

## 2022-02-26 DIAGNOSIS — F1010 Alcohol abuse, uncomplicated: Secondary | ICD-10-CM | POA: Diagnosis present

## 2022-02-26 DIAGNOSIS — R739 Hyperglycemia, unspecified: Secondary | ICD-10-CM | POA: Diagnosis present

## 2022-02-26 DIAGNOSIS — K148 Other diseases of tongue: Secondary | ICD-10-CM | POA: Diagnosis not present

## 2022-02-26 DIAGNOSIS — J9811 Atelectasis: Secondary | ICD-10-CM | POA: Diagnosis not present

## 2022-02-26 DIAGNOSIS — T783XXA Angioneurotic edema, initial encounter: Principal | ICD-10-CM | POA: Diagnosis present

## 2022-02-26 DIAGNOSIS — K219 Gastro-esophageal reflux disease without esophagitis: Secondary | ICD-10-CM | POA: Diagnosis present

## 2022-02-26 DIAGNOSIS — F419 Anxiety disorder, unspecified: Secondary | ICD-10-CM | POA: Diagnosis present

## 2022-02-26 LAB — CBC WITH DIFFERENTIAL/PLATELET
Abs Immature Granulocytes: 0.09 10*3/uL — ABNORMAL HIGH (ref 0.00–0.07)
Basophils Absolute: 0 10*3/uL (ref 0.0–0.1)
Basophils Relative: 0 %
Eosinophils Absolute: 0.2 10*3/uL (ref 0.0–0.5)
Eosinophils Relative: 2 %
HCT: 38.7 % (ref 36.0–46.0)
Hemoglobin: 12.9 g/dL (ref 12.0–15.0)
Immature Granulocytes: 1 %
Lymphocytes Relative: 27 %
Lymphs Abs: 2.8 10*3/uL (ref 0.7–4.0)
MCH: 31.8 pg (ref 26.0–34.0)
MCHC: 33.3 g/dL (ref 30.0–36.0)
MCV: 95.3 fL (ref 80.0–100.0)
Monocytes Absolute: 0.7 10*3/uL (ref 0.1–1.0)
Monocytes Relative: 7 %
Neutro Abs: 6.9 10*3/uL (ref 1.7–7.7)
Neutrophils Relative %: 63 %
Platelets: 203 10*3/uL (ref 150–400)
RBC: 4.06 MIL/uL (ref 3.87–5.11)
RDW: 14.1 % (ref 11.5–15.5)
WBC: 10.7 10*3/uL — ABNORMAL HIGH (ref 4.0–10.5)
nRBC: 0 % (ref 0.0–0.2)

## 2022-02-26 LAB — BASIC METABOLIC PANEL
Anion gap: 12 (ref 5–15)
BUN: 12 mg/dL (ref 8–23)
CO2: 23 mmol/L (ref 22–32)
Calcium: 10 mg/dL (ref 8.9–10.3)
Chloride: 105 mmol/L (ref 98–111)
Creatinine, Ser: 0.92 mg/dL (ref 0.44–1.00)
GFR, Estimated: >60 mL/min (ref >60)
Glucose, Bld: 126 mg/dL — ABNORMAL HIGH (ref 70–99)
Potassium: 4 mmol/L (ref 3.5–5.1)
Sodium: 140 mmol/L (ref 135–145)

## 2022-02-26 MED ORDER — DIPHENHYDRAMINE HCL 50 MG/ML IJ SOLN
50.0000 mg | Freq: Once | INTRAMUSCULAR | Status: AC
  Administered 2022-02-26: 50 mg via INTRAVENOUS
  Filled 2022-02-26: qty 1

## 2022-02-26 MED ORDER — TRANEXAMIC ACID-NACL 1000-0.7 MG/100ML-% IV SOLN
1000.0000 mg | Freq: Once | INTRAVENOUS | Status: AC
  Administered 2022-02-26: 1000 mg via INTRAVENOUS
  Filled 2022-02-26: qty 100

## 2022-02-26 MED ORDER — EPINEPHRINE PF 1 MG/ML IJ SOLN
1.0000 mg | Freq: Once | INTRAMUSCULAR | Status: AC
  Administered 2022-02-27: 1 mg via INTRAVENOUS
  Filled 2022-02-26: qty 1

## 2022-02-26 MED ORDER — METHYLPREDNISOLONE SODIUM SUCC 125 MG IJ SOLR
125.0000 mg | Freq: Once | INTRAMUSCULAR | Status: AC
  Administered 2022-02-26: 125 mg via INTRAVENOUS
  Filled 2022-02-26: qty 2

## 2022-02-26 MED ORDER — FAMOTIDINE IN NACL 20-0.9 MG/50ML-% IV SOLN
20.0000 mg | Freq: Once | INTRAVENOUS | Status: AC
  Administered 2022-02-26: 20 mg via INTRAVENOUS
  Filled 2022-02-26: qty 50

## 2022-02-26 NOTE — ED Notes (Signed)
Pt is A&Ox4. Pt is ambulatory. Pt has an allergic reaction to unknown source. Pt indicates they have been seeing an allergist but they have been unable to determine the allergen. Pt has had this swelling 8 times previously. Pt has been place in the ICU after being intubated in a previous admission.

## 2022-02-26 NOTE — ED Provider Notes (Signed)
[facility] Provider Note   Event Date/Time   First MD Initiated Contact with Patient 02/26/22 2259     (approximate) History  Allergic Reaction  HPI Sharon [name] is a 71 y.o. female with a stated past medical history of angioedema versus anaphylaxis to unknown allergen possibly alpha gal who presents symptoms that began approximately 4 hours prior to arrival has been worsening since onset.  Patient denies using home epinephrine pen.  Patient states she has not taken anything prior to arrival and denies any ingestions exposures. ROS: Patient currently denies any vision changes, tinnitus, difficulty speaking, facial droop, sore throat, chest pain, shortness of breath, abdominal pain, nausea/vomiting/diarrhea, dysuria, or weakness/numbness/paresthesias in any extremity   Physical Exam  Triage Vital Signs: ED Triage Vitals  Enc Vitals Group     BP 02/26/22 2250 (!) 146/88     Pulse Rate 02/26/22 2250 84     Resp 02/26/22 2250 (!) 21     Temp 02/26/22 2250 98.9 F (37.2 C)     Temp Source 02/26/22 2250 Oral     SpO2 02/26/22 2250 95 %     Weight 02/26/22 2251 202 lb (91.6 kg)     Height 02/26/22 2251 5\' 6"  (1.676 m)     Head Circumference --      Peak Flow --      Pain Score 02/26/22 2251 0     Pain Loc --      Pain Edu? --      Excl. in GC? --    Most recent vital signs: Vitals:   02/26/22 2330 02/27/22 0000  BP: (!) 159/104 (!) 141/95  Pulse: 96 84  Resp: (!) 24   Temp:    SpO2: 97% 97%   General: Awake, oriented x4. CV:  Good peripheral perfusion.  Resp:  Normal effort.  Abd:  No distention.  Other:  Elderly overweight Caucasian female laying in bed in no acute distress.  Significant tongue and submandibular swelling without stridor ED Results / Procedures / Treatments  Labs (all labs ordered are listed, but only abnormal results are displayed) Labs Reviewed  BASIC METABOLIC PANEL - Abnormal; Notable for the following components:       Result Value   Glucose, Bld 126 (*)    All other components within normal limits  CBC WITH DIFFERENTIAL/PLATELET - Abnormal; Notable for the following components:   WBC 10.7 (*)    Abs Immature Granulocytes 0.09 (*)    All other components within normal limits   EKG ED ECG REPORT I, 13/06/23, the attending physician, personally viewed and interpreted this ECG. Date: 02/26/2022 EKG Time: 2254 Rate: 82 Rhythm: normal sinus rhythm QRS Axis: normal Intervals: normal ST/T Wave abnormalities: normal Narrative Interpretation: no evidence of acute ischemia RADIOLOGY ED MD interpretation: One-view portable chest x-ray interpreted by me shows no evidence of acute abnormalities including no pneumonia, pneumothorax, or widened mediastinum -Agree with radiology assessment Official radiology report(s): DG Chest Port 1 View  Result Date: 02/26/2022 CLINICAL DATA:  Allergic reaction with tongue and facial swelling EXAM: PORTABLE CHEST 1 VIEW COMPARISON:  Radiographs 04/20/2021 FINDINGS: Cardiomegaly. Aortic atherosclerotic calcification. No focal consolidation, pleural effusion, or pneumothorax. Left basilar atelectasis. No acute osseous abnormality. IMPRESSION: No active disease. Electronically Signed   By: 04/22/2021 M.D.   On: 02/26/2022 23:21   PROCEDURES: Critical Care performed: Yes, see critical care procedure note(s) .1-3 Lead EKG Interpretation  Performed by: 13/08/2021, MD Authorized by:  Naaman Plummer, MD     Interpretation: normal     ECG rate:  95   ECG rate assessment: normal     Rhythm: sinus rhythm     Ectopy: none     Conduction: normal   CRITICAL CARE Performed by: Naaman Plummer  Total critical care time: 31 minutes  Critical care time was exclusive of separately billable procedures and treating other patients.  Critical care was necessary to treat or prevent imminent or life-threatening deterioration.  Critical care was time spent personally by me  on the following activities: development of treatment plan with patient and/or surrogate as well as nursing, discussions with consultants, evaluation of patient's response to treatment, examination of patient, obtaining history from patient or surrogate, ordering and performing treatments and interventions, ordering and review of laboratory studies, ordering and review of radiographic studies, pulse oximetry and re-evaluation of patient's condition.  MEDICATIONS ORDERED IN ED: Medications  diphenhydrAMINE (BENADRYL) injection 50 mg (50 mg Intravenous Given 02/26/22 2308)  famotidine (PEPCID) IVPB 20 mg premix (0 mg Intravenous Stopped 02/26/22 2352)  methylPREDNISolone sodium succinate (SOLU-MEDROL) 125 mg/2 mL injection 125 mg (125 mg Intravenous Given 02/26/22 2308)  tranexamic acid (CYKLOKAPRON) IVPB 1,000 mg (1,000 mg Intravenous New Bag/Given 02/26/22 2350)  EPINEPHrine (ADRENALIN) 1 mg (1 mg Intravenous Given 02/27/22 0004)   IMPRESSION / MDM / ASSESSMENT AND PLAN / ED COURSE  I reviewed the triage vital signs and the nursing notes.                             The patient is on the cardiac monitor to evaluate for evidence of arrhythmia and/or significant heart rate changes. Patient's presentation is most consistent with acute presentation with potential threat to life or bodily function. + Tongue swelling and submandibular swelling No evidence of multiorgan involvement  Given history and exam, presentation most consistent with allergic reaction. I have low suspicion for toxic shock syndrome, anaphylaxis, asthma exacerbation, or drug toxicity. Tx: 50 mg Benadryl, 125 mg methylprednisolone, 20 mg famotidine, 1 g tranexamic acid, 1 mg epinephrine Airway remained patent throughout emergency department course.  However, given patient's history of difficult intubation, patient will require admission to the ICU for monitoring overnight Disposition: Admit to the ICU for further monitoring of airway  compromise Clinical Course as of 02/27/22 0005  Mon Feb 27, 2022  0002 DG Chest [place] 1 View [EB]    Clinical Course User Index [EB] Cheri Fowler Vista Lawman, MD   FINAL CLINICAL IMPRESSION(S) / ED DIAGNOSES   Final diagnoses:  Allergic reaction, initial encounter  Severe tongue swelling   Rx / DC Orders   ED Discharge Orders     None      Note:  This document was prepared using Dragon voice recognition software and may include unintentional dictation errors.   Naaman Plummer, MD 02/27/22 0005

## 2022-02-26 NOTE — ED Triage Notes (Signed)
Pt arrives via POV with CC of allergic reaction related to unknown irritant. Pt presents with tongue and facial swelling. Pt able to speak in full sentences at this time. Denies SOB and difficulty breathing at this time.

## 2022-02-27 DIAGNOSIS — F1010 Alcohol abuse, uncomplicated: Secondary | ICD-10-CM | POA: Diagnosis present

## 2022-02-27 DIAGNOSIS — R739 Hyperglycemia, unspecified: Secondary | ICD-10-CM | POA: Diagnosis not present

## 2022-02-27 DIAGNOSIS — I10 Essential (primary) hypertension: Secondary | ICD-10-CM | POA: Diagnosis not present

## 2022-02-27 DIAGNOSIS — T783XXA Angioneurotic edema, initial encounter: Secondary | ICD-10-CM | POA: Diagnosis not present

## 2022-02-27 DIAGNOSIS — Z79899 Other long term (current) drug therapy: Secondary | ICD-10-CM | POA: Diagnosis not present

## 2022-02-27 DIAGNOSIS — Z87891 Personal history of nicotine dependence: Secondary | ICD-10-CM | POA: Diagnosis not present

## 2022-02-27 DIAGNOSIS — Z91014 Allergy to mammalian meats: Secondary | ICD-10-CM | POA: Diagnosis not present

## 2022-02-27 DIAGNOSIS — Z8249 Family history of ischemic heart disease and other diseases of the circulatory system: Secondary | ICD-10-CM | POA: Diagnosis not present

## 2022-02-27 DIAGNOSIS — E039 Hypothyroidism, unspecified: Secondary | ICD-10-CM | POA: Diagnosis present

## 2022-02-27 DIAGNOSIS — T464X5A Adverse effect of angiotensin-converting-enzyme inhibitors, initial encounter: Secondary | ICD-10-CM | POA: Diagnosis not present

## 2022-02-27 DIAGNOSIS — Z6832 Body mass index (BMI) 32.0-32.9, adult: Secondary | ICD-10-CM | POA: Diagnosis not present

## 2022-02-27 DIAGNOSIS — J9601 Acute respiratory failure with hypoxia: Secondary | ICD-10-CM | POA: Diagnosis present

## 2022-02-27 DIAGNOSIS — T380X5A Adverse effect of glucocorticoids and synthetic analogues, initial encounter: Secondary | ICD-10-CM | POA: Diagnosis present

## 2022-02-27 DIAGNOSIS — Z79890 Hormone replacement therapy (postmenopausal): Secondary | ICD-10-CM | POA: Diagnosis not present

## 2022-02-27 DIAGNOSIS — K219 Gastro-esophageal reflux disease without esophagitis: Secondary | ICD-10-CM | POA: Diagnosis present

## 2022-02-27 DIAGNOSIS — Z7982 Long term (current) use of aspirin: Secondary | ICD-10-CM | POA: Diagnosis not present

## 2022-02-27 DIAGNOSIS — E785 Hyperlipidemia, unspecified: Secondary | ICD-10-CM | POA: Diagnosis present

## 2022-02-27 DIAGNOSIS — F419 Anxiety disorder, unspecified: Secondary | ICD-10-CM | POA: Diagnosis present

## 2022-02-27 DIAGNOSIS — E669 Obesity, unspecified: Secondary | ICD-10-CM | POA: Diagnosis present

## 2022-02-27 LAB — TSH: TSH: 0.886 u[IU]/mL (ref 0.350–4.500)

## 2022-02-27 LAB — T4, FREE: Free T4: 0.89 ng/dL (ref 0.61–1.12)

## 2022-02-27 LAB — BASIC METABOLIC PANEL
Anion gap: 9 (ref 5–15)
BUN: 14 mg/dL (ref 8–23)
CO2: 24 mmol/L (ref 22–32)
Calcium: 9.3 mg/dL (ref 8.9–10.3)
Chloride: 106 mmol/L (ref 98–111)
Creatinine, Ser: 0.94 mg/dL (ref 0.44–1.00)
GFR, Estimated: >60 mL/min (ref >60)
Glucose, Bld: 205 mg/dL — ABNORMAL HIGH (ref 70–99)
Potassium: 3.7 mmol/L (ref 3.5–5.1)
Sodium: 139 mmol/L (ref 135–145)

## 2022-02-27 LAB — CBC
HCT: 37.2 % (ref 36.0–46.0)
Hemoglobin: 12.4 g/dL (ref 12.0–15.0)
MCH: 31.6 pg (ref 26.0–34.0)
MCHC: 33.3 g/dL (ref 30.0–36.0)
MCV: 94.9 fL (ref 80.0–100.0)
Platelets: 195 10*3/uL (ref 150–400)
RBC: 3.92 MIL/uL (ref 3.87–5.11)
RDW: 14.1 % (ref 11.5–15.5)
WBC: 16.1 10*3/uL — ABNORMAL HIGH (ref 4.0–10.5)
nRBC: 0 % (ref 0.0–0.2)

## 2022-02-27 LAB — MAGNESIUM: Magnesium: 1.9 mg/dL (ref 1.7–2.4)

## 2022-02-27 LAB — PHOSPHORUS: Phosphorus: 3.4 mg/dL (ref 2.5–4.6)

## 2022-02-27 MED ORDER — ONDANSETRON HCL 4 MG/2ML IJ SOLN: 4.0000 mg | Freq: Once | INTRAMUSCULAR | Status: AC

## 2022-02-27 MED ORDER — C1 ESTERASE INHIBITOR (HUMAN) 500 UNITS IV KIT
20.0000 [IU]/kg | PACK | Freq: Once | INTRAVENOUS | Status: AC
  Administered 2022-02-27: 2000 [IU] via INTRAVENOUS
  Filled 2022-02-27: qty 2000

## 2022-02-27 MED ORDER — SODIUM CHLORIDE 0.9 % IV SOLN: INTRAVENOUS | Status: DC

## 2022-02-27 MED ORDER — IPRATROPIUM-ALBUTEROL 0.5-2.5 (3) MG/3ML IN SOLN: 3.0000 mL | Freq: Four times a day (QID) | RESPIRATORY_TRACT | Status: DC | PRN

## 2022-02-27 MED ORDER — METOPROLOL TARTRATE 5 MG/5ML IV SOLN: 5.0000 mg | INTRAVENOUS | Status: DC | PRN

## 2022-02-27 MED ORDER — POTASSIUM CHLORIDE 10 MEQ/100ML IV SOLN
10.0000 meq | INTRAVENOUS | Status: AC
  Administered 2022-02-27 (×2): 10 meq via INTRAVENOUS
  Filled 2022-02-27: qty 100

## 2022-02-27 MED ORDER — DEXAMETHASONE SODIUM PHOSPHATE 4 MG/ML IJ SOLN
4.0000 mg | Freq: Four times a day (QID) | INTRAMUSCULAR | Status: DC
  Administered 2022-02-27 – 2022-02-28 (×4): 4 mg via INTRAVENOUS
  Filled 2022-02-27 (×6): qty 1

## 2022-02-27 MED ORDER — FOLIC ACID 5 MG/ML IJ SOLN
1.0000 mg | Freq: Every day | INTRAMUSCULAR | Status: DC
  Filled 2022-02-27 (×3): qty 0.2

## 2022-02-27 MED ORDER — DIPHENHYDRAMINE HCL 50 MG/ML IJ SOLN
25.0000 mg | Freq: Three times a day (TID) | INTRAMUSCULAR | Status: DC
  Administered 2022-02-27 – 2022-02-28 (×4): 25 mg via INTRAVENOUS
  Filled 2022-02-27 (×4): qty 1

## 2022-02-27 MED ORDER — LORAZEPAM 2 MG/ML IJ SOLN: 2.0000 mg | INTRAMUSCULAR | Status: DC | PRN

## 2022-02-27 MED ORDER — LEVOTHYROXINE SODIUM 100 MCG/5ML IV SOLN: 25.0000 ug | Freq: Every day | INTRAVENOUS | Status: DC

## 2022-02-27 MED ORDER — DOCUSATE SODIUM 100 MG PO CAPS: 100.0000 mg | ORAL_CAPSULE | Freq: Two times a day (BID) | ORAL | Status: DC | PRN

## 2022-02-27 MED ORDER — IPRATROPIUM-ALBUTEROL 0.5-2.5 (3) MG/3ML IN SOLN
3.0000 mL | Freq: Four times a day (QID) | RESPIRATORY_TRACT | Status: DC
  Administered 2022-02-27 (×3): 3 mL via RESPIRATORY_TRACT
  Filled 2022-02-27 (×3): qty 3

## 2022-02-27 MED ORDER — THIAMINE HCL 100 MG/ML IJ SOLN
100.0000 mg | Freq: Every day | INTRAMUSCULAR | Status: DC
  Administered 2022-02-27: 100 mg via INTRAVENOUS
  Filled 2022-02-27 (×2): qty 2

## 2022-02-27 MED ORDER — FAMOTIDINE IN NACL 20-0.9 MG/50ML-% IV SOLN
20.0000 mg | Freq: Three times a day (TID) | INTRAVENOUS | Status: DC
  Administered 2022-02-27 – 2022-02-28 (×3): 20 mg via INTRAVENOUS
  Filled 2022-02-27 (×4): qty 50

## 2022-02-27 MED ORDER — PANTOPRAZOLE SODIUM 40 MG IV SOLR
40.0000 mg | Freq: Every day | INTRAVENOUS | Status: DC
  Administered 2022-02-27 (×2): 40 mg via INTRAVENOUS
  Filled 2022-02-27 (×2): qty 10

## 2022-02-27 MED ORDER — POLYETHYLENE GLYCOL 3350 17 G PO PACK: 17.0000 g | PACK | Freq: Every day | ORAL | Status: DC | PRN

## 2022-02-27 MED ORDER — ONDANSETRON HCL 4 MG/2ML IJ SOLN
INTRAMUSCULAR | Status: AC
  Administered 2022-02-27: 4 mg via INTRAVENOUS
  Filled 2022-02-27: qty 4

## 2022-02-27 NOTE — ED Notes (Signed)
EPI given IV push after verifying with Provider that was his intent. Provider verified that he did want the EPI to be given IV as it was a different concentration than IM. Pt was given the EPI and she became nauseated and began dry heaving. Pt's SpO2 began reading in the 70s due to vasso constriction from the EPI. 3lpm O2 was placed on pt for comfort. pt pale and shaky from medications.

## 2022-02-27 NOTE — Progress Notes (Signed)
[facility] for Electrolyte Monitoring and Replacement   Recent Labs: Potassium (mmol/L)  Date Value  02/27/2022 3.7   Magnesium (mg/dL)  Date Value  02/27/2022 1.9   Calcium (mg/dL)  Date Value  02/27/2022 9.3   Albumin (g/dL)  Date Value  02/14/2022 3.8   Phosphorus (mg/dL)  Date Value  02/27/2022 3.4   Sodium (mmol/L)  Date Value  02/27/2022 139     Assessment: 71 year old female being admitted to CCU after experiencing angioedema to unknown allergen. Past medical history includes supraventricular tachycardia, hypertension, past angioedema episode 01/2022, ETOH dependence. Currently NPO.   Goal of Therapy:  K > 4 Mag > 2 All other electrolytes within normal limits  Plan:  K 3. 7. Will order KCl IV 10 mEq x 2.  Follow up labs in [place] ,PharmD Clinical Pharmacist 02/27/2022 8:40 AM

## 2022-02-27 NOTE — Evaluation (Signed)
Clinical/Bedside Swallow Evaluation Patient Details  Name: Sharon [name] MRN: [id number] Date of Birth: 1950-11-13  Today's Date: 02/27/2022 Time: SLP Start Time (ACUTE ONLY): 62 SLP Stop Time (ACUTE ONLY): 1325 SLP Time Calculation (min) (ACUTE ONLY): 25 min  Past Medical History:  Past Medical History:  Diagnosis Date   Angio-edema    Anxiety    Cataract    GERD (gastroesophageal reflux disease)    Hyperlipidemia    Hypertension    Hypothyroidism    Vitamin D deficiency    Past Surgical History:  Past Surgical History:  Procedure Laterality Date   CATARACT EXTRACTION W/ INTRAOCULAR LENS IMPLANT Right 12/29/2016   Dr. Darleen Crocker   CATARACT EXTRACTION W/ INTRAOCULAR LENS IMPLANT Left 01/15/2017   Dr. Darleen Crocker   COLONOSCOPY     LEEP     TUBAL LIGATION     VEIN LIGATION AND STRIPPING     x2   HPI:  Per H&P: 71 y.o  female with significant PMH of recurrent angioedema secondary to suspected alpha gal syndrome, hypothyroidism, HTN, HLD, GERD, EtOH dependence, pSVT, and anxiety who presented to the ED with chief complaints of throat and tongue swelling. Admitted with Acute angioedema, per MD "facial and lingual, appears to have resolved during my eval this am. Patient s/p full scope of therapy.  - agree with overnight PCCM provider Rufina Falco NP - patient now requesting meal and is stable clinically." Last seen by SLP on 03/2021 with recommendation for mech soft solids and thin liquids. CXR 02/26/22: No active disease. Pt is currently NPO and resting comfortably on room air.    Assessment / Plan / Recommendation  Clinical Impression  Pt presents with suspected functional oropharyngeal swallow ability as indicated by completed clinical swallow assessment. Pt w/o s/sx of aspiration across trials of thin liquids (serial swallows of thin liquids), puree solids, and regular solids. No overt or subtle s/sx pharyngeal dysphagia noted. No change to vocal quality across trials.  Vitals stable for duration of trials, ranging from 94-99 for O2 saturations. Oral phase grossly functional despite minimal residual lingual/oral edema. Mastication is timely and oral clearance is complete.   Pt reported that particulate material (rice) led to increased occurrence of choking sensation. Education shared regarding substances that increase risk of choking (toast, rice, corn, etc), use of sauces/gravies for increased bolus cohesion, and impact of intermittent swelling/age related changes to swallow function. Pt reported understanding.   Recommend initiation of regular solids and thin liquids. Medications to be administered whole vs cut (as pt reports intermittent challenge with large capsules). General aspiration precautions (slow rate, small bites, elevated HOB, and alert for PO intake) apply. MD and RN aware of recommendations. No further SLP services indicated at this time. SLP Visit Diagnosis: Dysphagia, unspecified (R13.10)    Aspiration Risk  Mild aspiration risk    Diet Recommendation     Medication Administration: Whole meds with liquid (vs cut - per pt preferrence)    Other  Recommendations Oral Care Recommendations: Oral care BID    Recommendations for follow up therapy are one component of a multi-disciplinary discharge planning process, led by the attending physician.  Recommendations may be updated based on patient status, additional functional criteria and insurance authorization.  Follow up Recommendations No SLP follow up      Assistance Recommended at Discharge    Functional Status Assessment Patient has not had a recent decline in their functional status  Frequency and Duration  Prognosis        Swallow Study   General Date of Onset: 02/27/22 HPI: Per H&P: 71 y.o  female with significant PMH of recurrent angioedema secondary to suspected alpha gal syndrome, hypothyroidism, HTN, HLD, GERD, EtOH dependence, pSVT, and anxiety who presented to the  ED with chief complaints of throat and tongue swelling. Admitted with Acute angioedema, per MD "facial and lingual, appears to have resolved during my eval this am. Patient s/p full scope of therapy.  - agree with overnight PCCM provider Rufina Falco NP - patient now requesting meal and is stable clinically." Last seen by SLP on 03/2021 with recommendation for mech soft solids and thin liquids. CXR 02/26/22: No active disease. Pt is currently NPO and resting comfortably on room air. Type of Study: Bedside Swallow Evaluation Previous Swallow Assessment: SLP on 03/2021 with recommendation for mech soft solids and thin liquids. Diet Prior to this Study: NPO Temperature Spikes Noted: No (98.9 (WBC 16.1)) Respiratory Status: Room air History of Recent Intubation: No Behavior/Cognition: Alert;Cooperative;Pleasant mood Oral Cavity Assessment: Within Functional Limits Oral Care Completed by SLP: Yes Oral Cavity - Dentition: Missing dentition;Adequate natural dentition Vision: Functional for self-feeding Self-Feeding Abilities: Able to feed self Patient Positioning: Upright in bed Baseline Vocal Quality: Hoarse;Other (comment) (pt reports grossly consistent with baseline hoarse quality) Volitional Cough: Strong Volitional Swallow: Unable to elicit    Oral/Motor/Sensory Function Overall Oral Motor/Sensory Function: Within functional limits (minimal residual lingual swelling (sperior/floor of mouth) did not impede oral manipulation/movement)   Ice Chips Ice chips: Within functional limits   Thin Liquid Thin Liquid: Within functional limits    Nectar Thick Nectar Thick Liquid: Not tested   Honey Thick Honey Thick Liquid: Not tested   Puree Puree: Within functional limits   Solid   [place] Kayce Betty Clapp MS [facility] SLP  Solid: Within functional limits      [place] J Clapp 02/27/2022,2:55 PM

## 2022-02-27 NOTE — H&P (Addendum)
NAME:  Sharon [name], MRN:  [id number], DOB:  1950/10/09, LOS: 0 ADMISSION DATE:  02/26/2022, CONSULTATION DATE:  02/27/2022 REFERRING MD: Salomon Mast, CHIEF COMPLAINT:  Angioedema    HPI  71 y.o  female with significant PMH of recurrent angioedema secondary to suspected alpha gal syndrome, hypothyroidism, HTN, HLD, GERD, EtOH dependence, pSVT, and anxiety who presented to the ED with chief complaints of throat and tongue swelling.  On review of her chart, she has had multiple admissions (total 9) since December, 25th, 2022 with similar presentation after requiring mechanical ventilatory support.  Her last admission was on 10/24 but did not require mechanical ventilatory support.  Currently denies difficulty swallowing or breathing.   ED Course: Initial vital signs showed HR of 84 beats/minute, BP 146/88 mm Hg, the RR 1 breaths/minute, and the oxygen saturation 95% on 3 L and a temperature of 98.49F (37.2C). Marland Kitchen   Pertinent Labs/Diagnostics Findings: Chemistry: Unremarkable CBC: WBC: 10.7 otherwise unremarkable Imaging:  CXR> no acute cardiopulmonary process Medications Administered:  solumedrol 125 mg, 1g TXA, benadryl 25 mg- and 12.5 mg, epinephrine & pepcid 20 mg   She was stabilized in the ED with steroids, epinephrine, and anti-histamines as above. Swelling and shortness of breath has significantly improved. Given high risk of respiratory decline due to potential airway compromise, patient will be admitted to the ICU for close monitoring.  PCCM consulted.  She has remained stable with no wheezing or stridor.   Past Medical History  hypersensitivity to galactose?alpha?1,3?galactose (alpha?gal syndrome)  Significant Hospital Events   11/6: Admit to ICU because of recurrent episodes of angioedema, HIGH RISK for INTUBATION   Consults:  None  Procedures:  None  Significant Diagnostic Tests:  11/5: Chest Xray> no acute cardiopulmonary process  Micro Data:   None Antimicrobials:  None  OBJECTIVE  Blood pressure (!) 141/95, pulse 84, temperature 98.9 F (37.2 C), temperature source Oral, resp. rate (!) 24, height 5\' 6"  (1.676 m), weight 91.6 kg, SpO2 97 %.       No intake or output data in the 24 hours ending 02/27/22 0016 Filed Weights   02/26/22 2251  Weight: 91.6 kg   Physical Examination  GENERAL:71 year-old critically ill patient lying in the bed with no acute distress.  EYES: Pupils equal, round, reactive to light and accommodation. No scleral icterus. Extraocular muscles intact.  HEENT: Head atraumatic, normocephalic.  Moderate oral lingual edema NECK:  Supple, no jugular venous distention. No thyroid enlargement, no tenderness.  LUNGS: Normal breath sounds bilaterally, no wheezing, rales,rhonchi or crepitation. No use of accessory muscles of respiration.  CARDIOVASCULAR: S1, S2 normal. No murmurs, rubs, or gallops.  ABDOMEN: Soft, nontender, nondistended. Bowel sounds present. No organomegaly or mass.  EXTREMITIES: Upper and lower extremities are atraumatic in appearance without tenderness or deformity. No swelling or erythema. Full range of motion is noted to all joints. Muscle strength is 5/5 bilaterally.  Capillary refill > 3 seconds in all extremities. Pulses palpable.  NEUROLOGIC:The patient is awake, alert and oriented to person, place, and time with normal speech. Motor function is normal with muscle strength 5/5 bilaterally to upper and lower extremities. Sensation is intact bilaterally. Reflexes 2+ bilaterally. Cranial nerves are intact.  Gait not checked.  PSYCHIATRIC: Appropriate mood and affect. SKIN: No obvious rash, lesion, or ulcer.   Labs/imaging that I havepersonally reviewed  (right click and "Reselect all SmartList Selections" daily)     Labs   CBC: Recent Labs  Lab 02/26/22 2256  WBC  10.7*  NEUTROABS 6.9  HGB 12.9  HCT 38.7  MCV 95.3  PLT 203    Basic Metabolic Panel: Recent Labs  Lab  02/26/22 2256  NA 140  K 4.0  CL 105  CO2 23  GLUCOSE 126*  BUN 12  CREATININE 0.92  CALCIUM 10.0   GFR: Estimated Creatinine Clearance: 63.9 mL/min (by C-G formula based on SCr of 0.92 mg/dL). Recent Labs  Lab 02/26/22 2256  WBC 10.7*    Liver Function Tests: No results for input(s): "AST", "ALT", "ALKPHOS", "BILITOT", "PROT", "ALBUMIN" in the last 168 hours. No results for input(s): "LIPASE", "AMYLASE" in the last 168 hours. No results for input(s): "AMMONIA" in the last 168 hours.  ABG    Component Value Date/Time   PHART 7.41 04/17/2021 1817   PCO2ART 36 04/17/2021 1817   PO2ART 130 (H) 04/17/2021 1817   HCO3 22.8 04/17/2021 1817   ACIDBASEDEF 1.4 04/17/2021 1817   O2SAT 99.0 04/17/2021 1817     Coagulation Profile: No results for input(s): "INR", "PROTIME" in the last 168 hours.  Cardiac Enzymes: No results for input(s): "CKTOTAL", "CKMB", "CKMBINDEX", "TROPONINI" in the last 168 hours.  HbA1C: Hgb A1c MFr Bld  Date/Time Value Ref Range Status  12/12/2021 09:06 AM 5.7 4.6 - 6.5 % Final    Comment:    Glycemic Control Guidelines for People with Diabetes:Non Diabetic:  <6%Goal of Therapy: <7%Additional Action Suggested:  >8%     CBG: No results for input(s): "GLUCAP" in the last 168 hours.  Review of Systems:   Review of Systems  Constitutional: Negative.   HENT:  Positive for sore throat.   Eyes: Negative.   Respiratory: Negative.    Cardiovascular: Negative.   Gastrointestinal: Negative.   Genitourinary: Negative.   Musculoskeletal: Negative.   Skin: Negative.   Neurological: Negative.   Endo/Heme/Allergies: Negative.   Psychiatric/Behavioral:  The patient is nervous/anxious.    Past Medical History  She,  has a past medical history of Angio-edema, Anxiety, Cataract, GERD (gastroesophageal reflux disease), Hyperlipidemia, Hypertension, Hypothyroidism, and Vitamin D deficiency.   Surgical History    Past Surgical History:  Procedure  Laterality Date   CATARACT EXTRACTION W/ INTRAOCULAR LENS IMPLANT Right 12/29/2016   Dr. Mia Creek   CATARACT EXTRACTION W/ INTRAOCULAR LENS IMPLANT Left 01/15/2017   Dr. Mia Creek   COLONOSCOPY     LEEP     TUBAL LIGATION     VEIN LIGATION AND STRIPPING     x2     Social History   reports that she quit smoking about 25 years ago. Her smoking use included cigarettes. She has never used smokeless tobacco. She reports current alcohol use of about 4.0 standard drinks of alcohol per week. She reports that she does not use drugs.   Family History   Her family history includes Hypertension in her father and mother; Transient ischemic attack in her mother. There is no history of Colon cancer, Esophageal cancer, Rectal cancer, or Stomach cancer.   Allergies Allergies  Allergen Reactions   Cozaar [Losartan] Swelling   Zinc Gelatin [Zinc] Anaphylaxis   Alpha-Gal    Amlodipine Besylate     REACTION: edema   Atorvastatin     REACTION: muscle pain   Beef Allergy    Cat Hair Extract    Dust Mite Extract    Neomycin-Bacitracin Zn-Polymyx     REACTION: rash Per pt not sure if from meds or not     Home Medications  Prior to Admission  medications   Medication Sig Start Date End Date Taking? Authorizing Provider  albuterol (PROAIR HFA) 108 (90 Base) MCG/ACT inhaler TAKE 2 PUFFS BY MOUTH EVERY 6 HOURS AS NEEDED FOR WHEEZE OR SHORTNESS OF BREATH 04/27/21   Tower, Wynelle Fanny, MD  aspirin 81 MG tablet Take 81 mg by mouth daily.    [provider]  calcium carbonate (TUMS EX) 750 MG chewable tablet Chew 2 tablets by mouth at bedtime.    [provider]  Cholecalciferol (VITAMIN D) 1000 UNITS capsule Take 1,000 Units by mouth daily.    [provider]  diphenhydrAMINE (BENADRYL ALLERGY) 25 MG tablet Take 1 tablet (25 mg total) by mouth every 6 (six) hours as needed. 02/15/22   Shawna Clamp, MD  EPINEPHrine 0.3 mg/0.3 mL IJ SOAJ injection Inject 0.3 mg into the  muscle as needed for anaphylaxis. 04/27/21   Tower, Wynelle Fanny, MD  fluticasone (FLONASE) 50 MCG/ACT nasal spray USE 2 SPRAYS IN BOTH  NOSTRILS DAILY 08/24/21   Tower, Wynelle Fanny, MD  folic acid (FOLVITE) 1 MG tablet Take 1 tablet (1 mg total) by mouth daily. 02/16/22   Shawna Clamp, MD  hydrALAZINE (APRESOLINE) 25 MG tablet Take 0.5 tablets (12.5 mg total) by mouth every 8 (eight) hours. 10/04/21   Tower, Wynelle Fanny, MD  hydrochlorothiazide (HYDRODIURIL) 25 MG tablet Take 1 tablet (25 mg total) by mouth daily. 12/19/21   Tower, Wynelle Fanny, MD  levothyroxine (SYNTHROID) 50 MCG tablet TAKE 1 TABLET BY MOUTH DAILY  BEFORE BREAKFAST 11/09/21   Tower, Wynelle Fanny, MD  metoprolol tartrate (LOPRESSOR) 25 MG tablet Take 1 tablet (25 mg total) by mouth 2 (two) times daily. 01/31/22   Furth, Cadence H, PA-C  oxybutynin (DITROPAN-XL) 10 MG 24 hr tablet Take 1 tablet (10 mg total) by mouth daily. 12/27/21   Hollice Espy, MD  pantoprazole (PROTONIX) 40 MG tablet TAKE 1 TABLET BY MOUTH DAILY 11/09/21   Tower, Wynelle Fanny, MD  predniSONE (DELTASONE) 20 MG tablet Advised to take prednisone 40 mg for 3 days. 02/15/22   Shawna Clamp, MD  rosuvastatin (CRESTOR) 20 MG tablet Take 1 tablet (20 mg total) by mouth daily. 01/19/22   Tower, Wynelle Fanny, MD  sertraline (ZOLOFT) 100 MG tablet TAKE 1 TABLET BY MOUTH DAILY 11/09/21   Tower, Wynelle Fanny, MD  thiamine (VITAMIN B-1) 100 MG tablet Take 1 tablet (100 mg total) by mouth daily. 02/16/22   Shawna Clamp, MD  Scheduled Meds:  C1 esterase inhibitor (Human)  20 Units/kg Intravenous Once   ondansetron       ondansetron (ZOFRAN) IV  4 mg Intravenous Once   Continuous Infusions:  sodium chloride     PRN Meds:.docusate sodium, ondansetron, polyethylene glycol  Active Hospital Problem list     Assessment & Plan:  Acute Hypoxic Respiratory Failure secondary to Angioedema caused by alpha?gal Hypersensitivity Initially thought to be ACE/ARB induced, however continues to have recurrent episode  totaling 9 admission since 04/17/21.  Patient has required mechanical ventilatory support in the past -Supplemental O2 as needed to maintain O2 saturations 88 to 92% -High risk for intubation,. Anticipate difficulty airway -Intermittent chest x-ray & ABG PRN  -check alpha galactosidase -C1 esterase inhibitor given, x 1 dose -Steroids initiated: decadron 4 mg Q 6 h -Famotidine IV, benadryl Q 8 h -Scheduled and as needed bronchodilators q6   Steroid-Induced Hyperglycemia -CBG's Q4; Target range of 140 to 180 -SSI -Follow ICU Hypo/Hyperglycemia protocol  Hypothyroidism -Check TSH and free T4 -Synthroid IV  until able to tolerate p.o.   Hypertension Hyperlipidemia -Continuous cardiac monitoring -Maintain MAP >65 -Hold BP meds Metoprol, hydrochlorothiazide, hydralazine until able to tolerate p.o. will use PRN BP meds -consider restarting home simvastatin as patient stabilizes  Hx of EtOH use -IV thiamine and folate -As needed Ativan per CIWA protocol  Best practice:  Diet:  NPO Pain/Anxiety/Delirium protocol (if indicated): No VAP protocol (if indicated): Not indicated DVT prophylaxis: Contraindicated GI prophylaxis: N/A Glucose control:  SSI Yes Central venous access:  N/A Arterial line:  N/A Foley:  N/A Mobility:  bed rest  PT consulted: N/A Last date of multidisciplinary goals of care discussion [11/6] Code Status:  full code Disposition: ICU   = Goals of Care = Code Status Order: FULL  Primary Emergency Contact: Wigington,James Wishes to pursue full aggressive treatment and intervention options, including CPR and intubation, but goals of care will be addressed on going with family if that should become necessary.   Critical care time: 45 minutes       Webb Silversmith, DNP, CCRN, FNP-C, AGACNP-BC Acute Care Nurse Practitioner [facility]  PCCM on call pager [phone] until 7 am

## 2022-02-27 NOTE — Progress Notes (Signed)
Report received from [facility] RN Doroteo Bradford. All questions answered. Will message Doroteo Bradford when ready to receive patient as unit is also getting other admissions at same time per CN RN [place]. Erika aware and appreciates message when ready to receive patient to ICU.

## 2022-02-27 NOTE — ED Provider Notes (Incomplete)
[facility] Provider Note   Event Date/Time   First MD Initiated Contact with Patient 02/26/22 2259     (approximate) History  Allergic Reaction  HPI Sharon [name] is a 71 y.o. female with a stated past medical history of angioedema versus anaphylaxis to unknown allergen possibly alpha gal who presents symptoms that began approximately 4 hours prior to arrival has been worsening since onset.  Patient denies using home epinephrine pen.  Patient states she has not taken anything prior to arrival and denies any ingestions exposures. ROS: Patient currently denies any vision changes, tinnitus, difficulty speaking, facial droop, sore throat, chest pain, shortness of breath, abdominal pain, nausea/vomiting/diarrhea, dysuria, or weakness/numbness/paresthesias in any extremity   Physical Exam  Triage Vital Signs: ED Triage Vitals  Enc Vitals Group     BP 02/26/22 2250 (!) 146/88     Pulse Rate 02/26/22 2250 84     Resp 02/26/22 2250 (!) 21     Temp 02/26/22 2250 98.9 F (37.2 C)     Temp Source 02/26/22 2250 Oral     SpO2 02/26/22 2250 95 %     Weight 02/26/22 2251 202 lb (91.6 kg)     Height 02/26/22 2251 5\' 6"  (1.676 m)     Head Circumference --      Peak Flow --      Pain Score 02/26/22 2251 0     Pain Loc --      Pain Edu? --      Excl. in [place]? --    Most recent vital signs: Vitals:   02/26/22 2315 02/26/22 2330  BP: (!) 179/114 (!) 159/104  Pulse: (!) 106 96  Resp: 14 (!) 24  Temp:    SpO2: 98% 97%   General: Awake, oriented x4. CV:  Good peripheral perfusion.  Resp:  Normal effort.  Abd:  No distention.  Other:  Elderly overweight Caucasian female laying in bed in no acute distress.  Significant tongue and submandibular swelling without stridor ED Results / Procedures / Treatments  Labs (all labs ordered are listed, but only abnormal results are displayed) Labs Reviewed  BASIC METABOLIC PANEL - Abnormal; Notable for the following components:       Result Value   Glucose, Bld 126 (*)    All other components within normal limits  CBC WITH DIFFERENTIAL/PLATELET - Abnormal; Notable for the following components:   WBC 10.7 (*)    Abs Immature Granulocytes 0.09 (*)    All other components within normal limits   EKG ED ECG REPORT I, Naaman Plummer, the attending physician, personally viewed and interpreted this ECG. Date: 02/26/2022 EKG Time: 2254 Rate: 82 Rhythm: normal sinus rhythm QRS Axis: normal Intervals: normal ST/T Wave abnormalities: normal Narrative Interpretation: no evidence of acute ischemia RADIOLOGY ED MD interpretation: One-view portable chest x-ray interpreted by me shows no evidence of acute abnormalities including no pneumonia, pneumothorax, or widened mediastinum -Agree with radiology assessment Official radiology report(s): DG Chest Port 1 View  Result Date: 02/26/2022 CLINICAL DATA:  Allergic reaction with tongue and facial swelling EXAM: PORTABLE CHEST 1 VIEW COMPARISON:  Radiographs 04/20/2021 FINDINGS: Cardiomegaly. Aortic atherosclerotic calcification. No focal consolidation, pleural effusion, or pneumothorax. Left basilar atelectasis. No acute osseous abnormality. IMPRESSION: No active disease. Electronically Signed   By: Placido Sou M.D.   On: 02/26/2022 23:21   PROCEDURES: Critical Care performed: Yes, see critical care procedure note(s) Procedures MEDICATIONS ORDERED IN ED: Medications  EPINEPHrine (ADRENALIN) 1 mg (has  no administration in time range)  diphenhydrAMINE (BENADRYL) injection 50 mg (50 mg Intravenous Given 02/26/22 2308)  famotidine (PEPCID) IVPB 20 mg premix (0 mg Intravenous Stopped 02/26/22 2352)  methylPREDNISolone sodium succinate (SOLU-MEDROL) 125 mg/2 mL injection 125 mg (125 mg Intravenous Given 02/26/22 2308)  tranexamic acid (CYKLOKAPRON) IVPB 1,000 mg (1,000 mg Intravenous New Bag/Given 02/26/22 2350)   IMPRESSION / MDM / ASSESSMENT AND PLAN / ED COURSE  I reviewed the  triage vital signs and the nursing notes.                             The patient is on the cardiac monitor to evaluate for evidence of arrhythmia and/or significant heart rate changes. Patient's presentation is most consistent with acute presentation with potential threat to life or bodily function. + Tongue swelling and submandibular swelling No evidence of multiorgan involvement  Given history and exam, presentation most consistent with allergic reaction. I have low suspicion for toxic shock syndrome, anaphylaxis, asthma exacerbation, or drug toxicity. Tx: 50 mg Benadryl, 125 mg methylprednisolone, 20 mg famotidine, 1 g tranexamic acid, 1 mg epinephrine  Disposition: Admit to the ICU for further monitoring of airway compromise Clinical Course as of 02/27/22 0002  Mon Feb 27, 2022  0002 DG Chest [place] 1 View [EB]    Clinical Course User Index [EB] Cheri Fowler, Vista Lawman, MD   FINAL CLINICAL IMPRESSION(S) / ED DIAGNOSES   Final diagnoses:  None   Rx / DC Orders   ED Discharge Orders     None      Note:  This document was prepared using Dragon voice recognition software and may include unintentional dictation errors.

## 2022-02-28 ENCOUNTER — Other Ambulatory Visit: Payer: Self-pay | Admitting: Family Medicine

## 2022-02-28 DIAGNOSIS — T464X5A Adverse effect of angiotensin-converting-enzyme inhibitors, initial encounter: Secondary | ICD-10-CM | POA: Diagnosis not present

## 2022-02-28 DIAGNOSIS — I10 Essential (primary) hypertension: Secondary | ICD-10-CM

## 2022-02-28 DIAGNOSIS — T783XXA Angioneurotic edema, initial encounter: Secondary | ICD-10-CM

## 2022-02-28 DIAGNOSIS — R739 Hyperglycemia, unspecified: Secondary | ICD-10-CM | POA: Diagnosis not present

## 2022-02-28 LAB — CBC
HCT: 34.4 % — ABNORMAL LOW (ref 36.0–46.0)
Hemoglobin: 11.5 g/dL — ABNORMAL LOW (ref 12.0–15.0)
MCH: 31.5 pg (ref 26.0–34.0)
MCHC: 33.4 g/dL (ref 30.0–36.0)
MCV: 94.2 fL (ref 80.0–100.0)
Platelets: 170 10*3/uL (ref 150–400)
RBC: 3.65 MIL/uL — ABNORMAL LOW (ref 3.87–5.11)
RDW: 13.9 % (ref 11.5–15.5)
WBC: 11.7 10*3/uL — ABNORMAL HIGH (ref 4.0–10.5)
nRBC: 0 % (ref 0.0–0.2)

## 2022-02-28 LAB — MAGNESIUM: Magnesium: 2.1 mg/dL (ref 1.7–2.4)

## 2022-02-28 LAB — BASIC METABOLIC PANEL
Anion gap: 6 (ref 5–15)
BUN: 16 mg/dL (ref 8–23)
CO2: 24 mmol/L (ref 22–32)
Calcium: 9.3 mg/dL (ref 8.9–10.3)
Chloride: 107 mmol/L (ref 98–111)
Creatinine, Ser: 0.89 mg/dL (ref 0.44–1.00)
GFR, Estimated: >60 mL/min (ref >60)
Glucose, Bld: 140 mg/dL — ABNORMAL HIGH (ref 70–99)
Potassium: 4.2 mmol/L (ref 3.5–5.1)
Sodium: 137 mmol/L (ref 135–145)

## 2022-02-28 LAB — PHOSPHORUS: Phosphorus: 3.4 mg/dL (ref 2.5–4.6)

## 2022-02-28 MED ORDER — OXYBUTYNIN CHLORIDE ER 10 MG PO TB24: 10.0000 mg | ORAL_TABLET | Freq: Every day | ORAL | 2 refills | Status: DC

## 2022-02-28 MED ORDER — PREDNISONE 20 MG PO TABS: 20.0000 mg | ORAL_TABLET | Freq: Every day | ORAL | 0 refills | Status: AC

## 2022-02-28 NOTE — TOC Initial Note (Signed)
Transition of Care [facility]) - Initial/Assessment Note    Patient Details  Name: Sharon [name] MRN: [id number] Date of Birth: [birth year]  Transition of Care [facility]) CM/SW Contact:    Beverly Sessions, RN Phone Number: 02/28/2022, 9:16 AM  Clinical Narrative:                   Transition of Care [facility]) Screening Note   Patient Details  Name: Sharon [name] Date of Birth: 04/16/1951   Transition of Care [facility]) CM/SW Contact:    Beverly Sessions, RN Phone Number: 02/28/2022, 9:17 AM    Transition of Care Department [facility]) has reviewed patient and no TOC needs have been identified at this time. We will continue to monitor patient advancement through interdisciplinary progression rounds. If new patient transition needs arise, please place a TOC consult.  Per MD no toc needs for discharge        Patient Goals and CMS Choice        Expected Discharge Plan and Services           Expected Discharge Date: 02/28/22                                    Prior Living Arrangements/Services                       Activities of Daily Living Home Assistive Devices/Equipment: None ADL Screening (condition at time of admission) Patient's cognitive ability adequate to safely complete daily activities?: Yes Is the patient deaf or have difficulty hearing?: No Does the patient have difficulty seeing, even when wearing glasses/contacts?: No Does the patient have difficulty concentrating, remembering, or making decisions?: No Patient able to express need for assistance with ADLs?: Yes Does the patient have difficulty dressing or bathing?: No Independently performs ADLs?: Yes (appropriate for developmental age) Does the patient have difficulty walking or climbing stairs?: No Weakness of Legs: None Weakness of Arms/Hands: None  Permission Sought/Granted                  Emotional Assessment              Admission diagnosis:  Allergic reaction, initial  encounter [T78.40XA] Severe tongue swelling [R22.0] Angioedema due to angiotensin converting enzyme inhibitor (ACE-I) [T78.3XXA, [id number] Patient Active Problem List   Diagnosis Date Noted   Angioedema due to angiotensin converting enzyme inhibitor (ACE-I) 02/27/2022   SVT (supraventricular tachycardia) 02/22/2022   Urinary incontinence 12/09/2021   Pedal edema 07/26/2021   Diarrhea 04/27/2021   Angioedema 04/17/2021   Dizziness 08/23/2020   Fatigue 08/23/2020   Hyperglycemia 12/17/2019   Medicare annual wellness visit, subsequent 08/28/2018   Cough 01/28/2018   Welcome to Medicare preventive visit 08/22/2016   Obesity 10/05/2014   Anemia 09/24/2013   Palpitations 03/03/2013   Colon cancer screening 03/03/2013   Hypokalemia 10/09/2011   Routine general medical examination at a health care facility 10/02/2011   Vitamin D deficiency disease 12/28/2010   ARTHRALGIA 08/19/2007   Hypothyroidism 01/31/2007   Hyperlipidemia 01/31/2007   Anxiety state 01/31/2007   Essential hypertension 01/31/2007   GERD 01/31/2007   PCP:  Abner Greenspan, MD Pharmacy:   [id number] - [address] [place] [address] Phone: [phone] Fax: [phone]  [facility] ([address], [place]  [facility] [address] [place] [address] Phone: [phone] Fax: [place], [place] [address] Phone: [phone] Fax: [phone]     Social Determinants of Health (SDOH) Interventions    Readmission Risk Interventions     No data to display

## 2022-02-28 NOTE — Progress Notes (Signed)
Raechel Ache to be D/C'd Home per MD order.  Discussed prescriptions and follow up appointments with the patient. Prescriptions given to patient, medication list explained in detail. Pt verbalized understanding.  Allergies as of 02/28/2022       Reactions   Cozaar [losartan] Swelling   Zinc Gelatin [zinc] Anaphylaxis   Alpha-gal    Amlodipine Besylate    REACTION: edema   Atorvastatin    REACTION: muscle pain   Beef Allergy    Cat Hair Extract    Dust Mite Extract    Neomycin-bacitracin Zn-polymyx    REACTION: rash Per pt not sure if from meds or not        Medication List     TAKE these medications    albuterol 108 (90 Base) MCG/ACT inhaler Commonly known as: ProAir HFA TAKE 2 PUFFS BY MOUTH EVERY 6 HOURS AS NEEDED FOR WHEEZE OR SHORTNESS OF BREATH   aspirin 81 MG tablet Take 81 mg by mouth daily.   calcium carbonate 750 MG chewable tablet Commonly known as: TUMS EX Chew 2 tablets by mouth at bedtime.   diphenhydrAMINE 25 MG tablet Commonly known as: Benadryl Allergy Take 1 tablet (25 mg total) by mouth every 6 (six) hours as needed.   EPINEPHrine 0.3 mg/0.3 mL Soaj injection Commonly known as: EPI-PEN Inject 0.3 mg into the muscle as needed for anaphylaxis.   fluticasone 50 MCG/ACT nasal spray Commonly known as: FLONASE USE 2 SPRAYS IN BOTH  NOSTRILS DAILY   folic acid 1 MG tablet Commonly known as: FOLVITE Take 1 tablet (1 mg total) by mouth daily.   hydrALAZINE 25 MG tablet Commonly known as: APRESOLINE Take 0.5 tablets (12.5 mg total) by mouth every 8 (eight) hours.   hydrochlorothiazide 25 MG tablet Commonly known as: HYDRODIURIL Take 1 tablet (25 mg total) by mouth daily.   levothyroxine 50 MCG tablet Commonly known as: SYNTHROID TAKE 1 TABLET BY MOUTH DAILY  BEFORE BREAKFAST   metoprolol tartrate 25 MG tablet Commonly known as: LOPRESSOR Take 1 tablet (25 mg total) by mouth 2 (two) times daily.   oxybutynin 10 MG 24 hr tablet Commonly  known as: DITROPAN-XL Take 1 tablet (10 mg total) by mouth daily.   pantoprazole 40 MG tablet Commonly known as: PROTONIX TAKE 1 TABLET BY MOUTH DAILY   predniSONE 20 MG tablet Commonly known as: DELTASONE Take 1 tablet (20 mg total) by mouth daily with breakfast for 3 days. What changed:  how much to take how to take this when to take this additional instructions   rosuvastatin 20 MG tablet Commonly known as: Crestor Take 1 tablet (20 mg total) by mouth daily.   sertraline 100 MG tablet Commonly known as: ZOLOFT TAKE 1 TABLET BY MOUTH DAILY   thiamine 100 MG tablet Commonly known as: Vitamin B-1 Take 1 tablet (100 mg total) by mouth daily.   Vitamin D 1000 units capsule Take 1,000 Units by mouth daily.        Vitals:   02/28/22 0523 02/28/22 0739  BP: (!) 158/88 (!) 154/86  Pulse: 78 78  Resp: 20 16  Temp: 98.2 F (36.8 C) 98.4 F (36.9 C)  SpO2: 97% 96%    Skin clean, dry and intact without evidence of skin break down, no evidence of skin tears noted. IV catheter discontinued intact. Site without signs and symptoms of complications. Dressing and pressure applied. Pt denies pain at this time. No complaints noted.  An After Visit Summary was printed and  given to the patient. Patient escorted via [place], and D/C home via private auto.  Fuller Mandril, RN

## 2022-02-28 NOTE — Discharge Summary (Signed)
Physician Discharge Summary   Patient: Sharon [name] MRN: [id number] DOB: [birth year]  Admit date:     02/26/2022  Discharge date: 02/28/22  Discharge Physician: Sharen Hones   PCP: Abner Greenspan, MD   Recommendations at discharge:   Follow-up with PCP in 1 week. Patient also scheduled to see allergy doctor in 1 week.  Discharge Diagnoses: Principal Problem:   Angioedema due to angiotensin converting enzyme inhibitor (ACE-I) Active Problems:   Hypothyroidism   Essential hypertension   GERD   Obesity   Hyperglycemia Alcohol abuse. Resolved Problems:   * No resolved hospital problems. *  Hospital Course: 71 y.o  female with significant PMH of recurrent angioedema secondary to suspected alpha gal syndrome, hypothyroidism, HTN, HLD, GERD, EtOH dependence, pSVT, and anxiety who presented to the ED with chief complaints of throat and tongue swelling.   Patient had a angioedema, she was given steroids, Benadryl and Pepcid.  Condition has resolved today.  Apparently, patient had multiple episodes in the past, she was not taking ACE inhibitor, she could not identify any food allergy.  She is scheduled to see allergy doctor at the end of this week. Since her condition has resolved, she will be treated with steroids for 3 days.  Assessment and Plan: Recurrent angioedema. Etiology still unclear, but condition resolved.  Follow-up with allergist Dr. As previously scheduled.  Essential hypertension.  Resume home medicines with hydralazine, metoprolol and HCTZ.  Alcohol abuse. No evidence of withdrawal.  Potassium and magnesium are normal.  Obesity with BMI 32.6. I advised diet and exercise.  Hyperglycemia. Patient does not have a history of diabetes, this could be secondary to stress from angioedema.  Glucose is better.  Follow-up with PCP as outpatient.       Consultants: ICU Procedures performed: None  Disposition: Home Diet recommendation:  Discharge Diet Orders (From  admission, onward)     Start     Ordered   02/28/22 0000  Diet - low sodium heart healthy        02/28/22 0904           Cardiac diet DISCHARGE MEDICATION: Allergies as of 02/28/2022       Reactions   Cozaar [losartan] Swelling   Zinc Gelatin [zinc] Anaphylaxis   Alpha-gal    Amlodipine Besylate    REACTION: edema   Atorvastatin    REACTION: muscle pain   Beef Allergy    Cat Hair Extract    Dust Mite Extract    Neomycin-bacitracin Zn-polymyx    REACTION: rash Per pt not sure if from meds or not        Medication List     TAKE these medications    albuterol 108 (90 Base) MCG/ACT inhaler Commonly known as: ProAir HFA TAKE 2 PUFFS BY MOUTH EVERY 6 HOURS AS NEEDED FOR WHEEZE OR SHORTNESS OF BREATH   aspirin 81 MG tablet Take 81 mg by mouth daily.   calcium carbonate 750 MG chewable tablet Commonly known as: TUMS EX Chew 2 tablets by mouth at bedtime.   diphenhydrAMINE 25 MG tablet Commonly known as: Benadryl Allergy Take 1 tablet (25 mg total) by mouth every 6 (six) hours as needed.   EPINEPHrine 0.3 mg/0.3 mL Soaj injection Commonly known as: EPI-PEN Inject 0.3 mg into the muscle as needed for anaphylaxis.   fluticasone 50 MCG/ACT nasal spray Commonly known as: FLONASE USE 2 SPRAYS IN BOTH  NOSTRILS DAILY   folic acid 1 MG tablet Commonly known as: FOLVITE Take  1 tablet (1 mg total) by mouth daily.   hydrALAZINE 25 MG tablet Commonly known as: APRESOLINE Take 0.5 tablets (12.5 mg total) by mouth every 8 (eight) hours.   hydrochlorothiazide 25 MG tablet Commonly known as: HYDRODIURIL Take 1 tablet (25 mg total) by mouth daily.   levothyroxine 50 MCG tablet Commonly known as: SYNTHROID TAKE 1 TABLET BY MOUTH DAILY  BEFORE BREAKFAST   metoprolol tartrate 25 MG tablet Commonly known as: LOPRESSOR Take 1 tablet (25 mg total) by mouth 2 (two) times daily.   oxybutynin 10 MG 24 hr tablet Commonly known as: DITROPAN-XL Take 1 tablet (10 mg total)  by mouth daily.   pantoprazole 40 MG tablet Commonly known as: PROTONIX TAKE 1 TABLET BY MOUTH DAILY   predniSONE 20 MG tablet Commonly known as: DELTASONE Take 1 tablet (20 mg total) by mouth daily with breakfast for 3 days. What changed:  how much to take how to take this when to take this additional instructions   rosuvastatin 20 MG tablet Commonly known as: Crestor Take 1 tablet (20 mg total) by mouth daily.   sertraline 100 MG tablet Commonly known as: ZOLOFT TAKE 1 TABLET BY MOUTH DAILY   thiamine 100 MG tablet Commonly known as: Vitamin B-1 Take 1 tablet (100 mg total) by mouth daily.   Vitamin D 1000 units capsule Take 1,000 Units by mouth daily.        Follow-up Information     Tower, Wynelle Fanny, MD Follow up in 1 week(s).   Specialties: Family Medicine, Radiology Contact information: [place] [address] [phone]                Discharge Exam: Danley Danker Weights   02/26/22 2251  Weight: 91.6 kg   General exam: Appears calm and comfortable  Respiratory system: Clear to auscultation. Respiratory effort normal. Cardiovascular system: S1 & S2 heard, RRR. No JVD, murmurs, rubs, gallops or clicks. No pedal edema. Gastrointestinal system: Abdomen is nondistended, soft and nontender. No organomegaly or masses felt. Normal bowel sounds heard. Central nervous system: Alert and oriented. No focal neurological deficits. Extremities: Symmetric 5 x 5 power. Skin: No rashes, lesions or ulcers Psychiatry: Judgement and insight appear normal. Mood & affect appropriate.    Condition at discharge: good  The results of significant diagnostics from this hospitalization (including imaging, microbiology, ancillary and laboratory) are listed below for reference.   Imaging Studies: DG Chest Port 1 View  Result Date: 02/26/2022 CLINICAL DATA:  Allergic reaction with tongue and facial swelling EXAM: PORTABLE CHEST 1 VIEW COMPARISON:   Radiographs 04/20/2021 FINDINGS: Cardiomegaly. Aortic atherosclerotic calcification. No focal consolidation, pleural effusion, or pneumothorax. Left basilar atelectasis. No acute osseous abnormality. IMPRESSION: No active disease. Electronically Signed   By: Placido Sou M.D.   On: 02/26/2022 23:21    Microbiology: Results for orders placed or performed during the hospital encounter of 02/14/22  MRSA Next Gen by PCR, Nasal     Status: None   Collection Time: 02/14/22  4:37 AM   Specimen: Nasal Mucosa; Nasal Swab  Result Value Ref Range Status   MRSA by PCR Next Gen NOT DETECTED NOT DETECTED Final    Comment: (NOTE) The GeneXpert MRSA Assay (FDA approved for NASAL specimens only), is one component of a comprehensive MRSA colonization surveillance program. It is not intended to diagnose MRSA infection nor to guide or monitor treatment for MRSA infections. Test performance is not FDA approved in patients less than 2 years  old. Performed at [facility], [address] [place], [place] [address]     Labs: CBC: Recent Labs  Lab 02/26/22 2256 02/27/22 0412 02/28/22 0630  WBC 10.7* 16.1* 11.7*  NEUTROABS 6.9  --   --   HGB 12.9 12.4 11.5*  HCT 38.7 37.2 34.4*  MCV 95.3 94.9 94.2  PLT 203 195 734   Basic Metabolic Panel: Recent Labs  Lab 02/26/22 2256 02/27/22 0622 02/28/22 0630  NA 140 139 137  K 4.0 3.7 4.2  CL 105 106 107  CO2 23 24 24   GLUCOSE 126* 205* 140*  BUN 12 14 16   CREATININE 0.92 0.94 0.89  CALCIUM 10.0 9.3 9.3  MG  --  1.9 2.1  PHOS  --  3.4 3.4   Liver Function Tests: No results for input(s): "AST", "ALT", "ALKPHOS", "BILITOT", "PROT", "ALBUMIN" in the last 168 hours. CBG: No results for input(s): "GLUCAP" in the last 168 hours.  Discharge time spent: greater than 30 minutes.  Signed: Sharen Hones, MD [facility] 02/28/2022

## 2022-03-01 ENCOUNTER — Telehealth: Payer: Self-pay

## 2022-03-01 NOTE — Telephone Encounter (Addendum)
Transition Care Management Unsuccessful Follow-up Telephone Call  Date of discharge and from where:  TCM DC [facility] 02-28-22 Dx: angioedema due to angiotensin converting enzyme inhibitor   Attempts:  1st Attempt  Reason for unsuccessful TCM follow-up call:  Left voice message   Woodfin Ganja LPN [facility] Nurse Health Advisor Direct Dial [phone]   Pt had fu appt with PCP 03-02-22 at 9am

## 2022-03-02 ENCOUNTER — Inpatient Hospital Stay: Payer: Medicare Other | Admitting: Family Medicine

## 2022-03-03 DIAGNOSIS — T783XXA Angioneurotic edema, initial encounter: Secondary | ICD-10-CM | POA: Diagnosis not present

## 2022-03-03 DIAGNOSIS — J3089 Other allergic rhinitis: Secondary | ICD-10-CM | POA: Diagnosis not present

## 2022-03-03 DIAGNOSIS — J3081 Allergic rhinitis due to animal (cat) (dog) hair and dander: Secondary | ICD-10-CM | POA: Diagnosis not present

## 2022-03-03 DIAGNOSIS — Z91018 Allergy to other foods: Secondary | ICD-10-CM | POA: Diagnosis not present

## 2022-03-03 LAB — ALPHA GALACTOSIDASE: Alpha galactosidase, serum: 89.7 nmol/hr/mg prt (ref >35.5)

## 2022-03-06 DIAGNOSIS — Z91018 Allergy to other foods: Secondary | ICD-10-CM | POA: Diagnosis not present

## 2022-03-13 NOTE — Progress Notes (Deleted)
03/14/2022 1:03 PM   Sharon [name] 25-Jun-1950 [id number]  Referring provider: Judy Pimple, MD [address] [place],  [place] [address]  Urological history: 1. Mixed incontinence -contributing factors of age, pedal edema, diuretics, HTN, anxiety, obesity, history of smoking and alcohol consumption -PVR 0 mL -oxybutynin XL 10 mg daily    No chief complaint on file.   HPI: Sharon [name] is a 71 y.o. female who presents today for 6 week follow up after starting oxybutynin.  At her visit on 02/07/2022, she was having 1 to 7 daytime voids, 1 to 2 night time urination.  She had severe urgency prior to the medication.  She had mild urgency since she started the medication.  She has mixed urinary incontinence.  She is leaking 3 more times weekly.  She wears 2 absorbent pads daily.  She does engage in bathroom mapping and decrease in fluid intake at times.  She has noticed that last few days, she has been experiencing some mild urge incontinence at night.  UA 11-30 WBCs and moderate bacteria.  PVR 0 mL.  Urine culture MUF.    She has been admitted to the hospital twice since she last saw me for allergic reactions.      PMH: Past Medical History:  Diagnosis Date   Angio-edema    Anxiety    Cataract    GERD (gastroesophageal reflux disease)    Hyperlipidemia    Hypertension    Hypothyroidism    Vitamin D deficiency     Surgical History: Past Surgical History:  Procedure Laterality Date   CATARACT EXTRACTION W/ INTRAOCULAR LENS IMPLANT Right 12/29/2016   Dr. Mia [place]   CATARACT EXTRACTION W/ INTRAOCULAR LENS IMPLANT Left 01/15/2017   Dr. Mia [place]   COLONOSCOPY     LEEP     TUBAL LIGATION     VEIN LIGATION AND STRIPPING     x2    Home Medications:  Allergies as of 03/14/2022       Reactions   Cozaar [losartan] Swelling   Zinc Gelatin [zinc] Anaphylaxis   Alpha-gal    Amlodipine Besylate    REACTION: edema   Atorvastatin    REACTION:  muscle pain   Beef Allergy    Cat Hair Extract    Dust Mite Extract    Neomycin-bacitracin Zn-polymyx    REACTION: rash Per pt not sure if from meds or not        Medication List        Accurate as of March 13, 2022  1:03 PM. If you have any questions, ask your nurse or doctor.          albuterol 108 (90 Base) MCG/ACT inhaler Commonly known as: ProAir HFA TAKE 2 PUFFS BY MOUTH EVERY 6 HOURS AS NEEDED FOR WHEEZE OR SHORTNESS OF BREATH   aspirin 81 MG tablet Take 81 mg by mouth daily.   calcium carbonate 750 MG chewable tablet Commonly known as: TUMS EX Chew 2 tablets by mouth at bedtime.   diphenhydrAMINE 25 MG tablet Commonly known as: Benadryl Allergy Take 1 tablet (25 mg total) by mouth every 6 (six) hours as needed.   EPINEPHrine 0.3 mg/0.3 mL Soaj injection Commonly known as: EPI-PEN Inject 0.3 mg into the muscle as needed for anaphylaxis.   fluticasone 50 MCG/ACT nasal spray Commonly known as: FLONASE USE 2 SPRAYS IN BOTH  NOSTRILS DAILY   folic acid 1 MG tablet Commonly known as: FOLVITE Take 1  tablet (1 mg total) by mouth daily.   hydrALAZINE 25 MG tablet Commonly known as: APRESOLINE Take 0.5 tablets (12.5 mg total) by mouth every 8 (eight) hours.   hydrochlorothiazide 25 MG tablet Commonly known as: HYDRODIURIL Take 1 tablet (25 mg total) by mouth daily.   levothyroxine 50 MCG tablet Commonly known as: SYNTHROID TAKE 1 TABLET BY MOUTH DAILY  BEFORE BREAKFAST   metoprolol tartrate 25 MG tablet Commonly known as: LOPRESSOR Take 1 tablet (25 mg total) by mouth 2 (two) times daily.   oxybutynin 10 MG 24 hr tablet Commonly known as: DITROPAN-XL Take 1 tablet (10 mg total) by mouth daily.   pantoprazole 40 MG tablet Commonly known as: PROTONIX TAKE 1 TABLET BY MOUTH DAILY   rosuvastatin 20 MG tablet Commonly known as: Crestor Take 1 tablet (20 mg total) by mouth daily.   sertraline 100 MG tablet Commonly known as: ZOLOFT TAKE 1  TABLET BY MOUTH DAILY   thiamine 100 MG tablet Commonly known as: Vitamin B-1 Take 1 tablet (100 mg total) by mouth daily.   Vitamin D 1000 units capsule Take 1,000 Units by mouth daily.        Allergies:  Allergies  Allergen Reactions   Cozaar [Losartan] Swelling   Zinc Gelatin [Zinc] Anaphylaxis   Alpha-Gal    Amlodipine Besylate     REACTION: edema   Atorvastatin     REACTION: muscle pain   Beef Allergy    Cat Hair Extract    Dust Mite Extract    Neomycin-Bacitracin Zn-Polymyx     REACTION: rash Per pt not sure if from meds or not    Family History: Family History  Problem Relation Age of Onset   Transient ischemic attack Mother    Hypertension Mother    Hypertension Father    Colon cancer Neg Hx    Esophageal cancer Neg Hx    Rectal cancer Neg Hx    Stomach cancer Neg Hx     Social History:  reports that she quit smoking about 25 years ago. Her smoking use included cigarettes. She has never used smokeless tobacco. She reports current alcohol use of about 4.0 standard drinks of alcohol per week. She reports that she does not use drugs.  ROS: Pertinent ROS in HPI  Physical Exam: There were no vitals taken for this visit.  Constitutional:  Well nourished. Alert and oriented, No acute distress. HEENT: [place] AT, moist mucus membranes.  Trachea midline, no masses. Cardiovascular: No clubbing, cyanosis, or edema. Respiratory: Normal respiratory effort, no increased work of breathing. GU: No CVA tenderness.  No bladder fullness or masses. Vulvovaginal atrophy w/ pallor, loss of rugae, introital retraction, excoriations.  Vulvar thinning, fusion of labia, clitoral hood retraction, prominent urethral meatus.   *** external genitalia, *** pubic hair distribution, no lesions.  Normal urethral meatus, no lesions, no prolapse, no discharge.   No urethral masses, tenderness and/or tenderness. No bladder fullness, tenderness or masses. *** vagina mucosa, *** estrogen effect, no  discharge, no lesions, *** pelvic support, *** cystocele and *** rectocele noted.  No cervical motion tenderness.  Uterus is freely mobile and non-fixed.  No adnexal/parametria masses or tenderness noted.  Anus and perineum are without rashes or lesions.   ***  Neurologic: Grossly intact, no focal deficits, moving all 4 extremities. Psychiatric: Normal mood and affect.    Laboratory Data: Lab Results  Component Value Date   WBC 11.7 (H) 02/28/2022   HGB 11.5 (L) 02/28/2022   HCT  34.4 (L) 02/28/2022   MCV 94.2 02/28/2022   PLT 170 02/28/2022    Lab Results  Component Value Date   CREATININE 0.89 02/28/2022    Lab Results  Component Value Date   HGBA1C 5.7 12/12/2021    Lab Results  Component Value Date   TSH 0.886 02/27/2022       Component Value Date/Time   CHOL 342 (H) 12/28/2021 0936   HDL 57.70 12/28/2021 0936   CHOLHDL 6 12/28/2021 0936   VLDL 59.8 (H) 12/28/2021 0936   LDLCALC 103 (H) 11/03/2013 0925    Lab Results  Component Value Date   AST 20 02/14/2022   Lab Results  Component Value Date   ALT 13 02/14/2022    Urinalysis See Epic and HPI I have reviewed the labs.   Pertinent Imaging:  02/07/22 13:51  Scan Result 0    Assessment & Plan:    1. Mixed incontinence -She has had some improvement on the oxybutynin, but she has a new symptom of urge incontinence at night -UA with pyuria bacteriuria -I will send the urine for culture to rule out subclinical infection -She will take AZO OTC if she happens to experiencing worsening of symptoms before urine culture is available -She will need antibiotics that do not have a gelcap as she has the Alphagan issue  No follow-ups on file.  These notes generated with voice recognition software. I apologize for typographical errors.  Cloretta Ned  [facility] [address]  [address] [place], [place] [address] [phone]

## 2022-03-14 ENCOUNTER — Ambulatory Visit: Payer: Medicare Other | Admitting: Urology

## 2022-03-22 NOTE — Progress Notes (Deleted)
03/23/2022 10:10 AM   Sharon [name] [birth year] [id number]  Referring provider: Judy Pimple, MD [address] [place],  [place] [address]  Urological history: 1. Mixed incontinence -contributing factors of age, pedal edema, diuretics, HTN, anxiety, obesity, history of smoking and alcohol consumption -PVR 0 mL -oxybutynin XL 10 mg daily    No chief complaint on file.   HPI: Sharon [name] is a 71 y.o. female who presents today for 6 week follow up after starting oxybutynin.  At her visit on 02/07/2022, she was having 1 to 7 daytime voids, 1 to 2 night time urination.  She had severe urgency prior to the medication.  She had mild urgency since she started the medication.  She has mixed urinary incontinence.  She is leaking 3 more times weekly.  She wears 2 absorbent pads daily.  She does engage in bathroom mapping and decrease in fluid intake at times.  She has noticed that last few days, she has been experiencing some mild urge incontinence at night.  UA 11-30 WBCs and moderate bacteria.  PVR 0 mL.  Urine culture MUF.    She has been admitted to the hospital twice since she last saw me for allergic reactions.   PVR ***     PMH: Past Medical History:  Diagnosis Date   Angio-edema    Anxiety    Cataract    GERD (gastroesophageal reflux disease)    Hyperlipidemia    Hypertension    Hypothyroidism    Vitamin D deficiency     Surgical History: Past Surgical History:  Procedure Laterality Date   CATARACT EXTRACTION W/ INTRAOCULAR LENS IMPLANT Right 12/29/2016   Dr. Mia Creek   CATARACT EXTRACTION W/ INTRAOCULAR LENS IMPLANT Left 01/15/2017   Dr. Mia Creek   COLONOSCOPY     LEEP     TUBAL LIGATION     VEIN LIGATION AND STRIPPING     x2    Home Medications:  Allergies as of 03/23/2022       Reactions   Cozaar [losartan] Swelling   Zinc Gelatin [zinc] Anaphylaxis   Alpha-gal    Amlodipine Besylate    REACTION: edema   Atorvastatin     REACTION: muscle pain   Beef Allergy    Cat Hair Extract    Dust Mite Extract    Neomycin-bacitracin Zn-polymyx    REACTION: rash Per pt not sure if from meds or not        Medication List        Accurate as of March 22, 2022 10:10 AM. If you have any questions, ask your nurse or doctor.          albuterol 108 (90 Base) MCG/ACT inhaler Commonly known as: ProAir HFA TAKE 2 PUFFS BY MOUTH EVERY 6 HOURS AS NEEDED FOR WHEEZE OR SHORTNESS OF BREATH   aspirin 81 MG tablet Take 81 mg by mouth daily.   calcium carbonate 750 MG chewable tablet Commonly known as: TUMS EX Chew 2 tablets by mouth at bedtime.   diphenhydrAMINE 25 MG tablet Commonly known as: Benadryl Allergy Take 1 tablet (25 mg total) by mouth every 6 (six) hours as needed.   EPINEPHrine 0.3 mg/0.3 mL Soaj injection Commonly known as: EPI-PEN Inject 0.3 mg into the muscle as needed for anaphylaxis.   fluticasone 50 MCG/ACT nasal spray Commonly known as: FLONASE USE 2 SPRAYS IN BOTH  NOSTRILS DAILY   folic acid 1 MG tablet Commonly known as: [employer]  Take 1 tablet (1 mg total) by mouth daily.   hydrALAZINE 25 MG tablet Commonly known as: APRESOLINE Take 0.5 tablets (12.5 mg total) by mouth every 8 (eight) hours.   hydrochlorothiazide 25 MG tablet Commonly known as: HYDRODIURIL Take 1 tablet (25 mg total) by mouth daily.   levothyroxine 50 MCG tablet Commonly known as: SYNTHROID TAKE 1 TABLET BY MOUTH DAILY  BEFORE BREAKFAST   metoprolol tartrate 25 MG tablet Commonly known as: LOPRESSOR Take 1 tablet (25 mg total) by mouth 2 (two) times daily.   oxybutynin 10 MG 24 hr tablet Commonly known as: DITROPAN-XL Take 1 tablet (10 mg total) by mouth daily.   pantoprazole 40 MG tablet Commonly known as: PROTONIX TAKE 1 TABLET BY MOUTH DAILY   rosuvastatin 20 MG tablet Commonly known as: Crestor Take 1 tablet (20 mg total) by mouth daily.   sertraline 100 MG tablet Commonly known as:  ZOLOFT TAKE 1 TABLET BY MOUTH DAILY   thiamine 100 MG tablet Commonly known as: Vitamin B-1 Take 1 tablet (100 mg total) by mouth daily.   Vitamin D 1000 units capsule Take 1,000 Units by mouth daily.        Allergies:  Allergies  Allergen Reactions   Cozaar [Losartan] Swelling   Zinc Gelatin [Zinc] Anaphylaxis   Alpha-Gal    Amlodipine Besylate     REACTION: edema   Atorvastatin     REACTION: muscle pain   Beef Allergy    Cat Hair Extract    Dust Mite Extract    Neomycin-Bacitracin Zn-Polymyx     REACTION: rash Per pt not sure if from meds or not    Family History: Family History  Problem Relation Age of Onset   Transient ischemic attack Mother    Hypertension Mother    Hypertension Father    Colon cancer Neg Hx    Esophageal cancer Neg Hx    Rectal cancer Neg Hx    Stomach cancer Neg Hx     Social History:  reports that she quit smoking about 25 years ago. Her smoking use included cigarettes. She has never used smokeless tobacco. She reports current alcohol use of about 4.0 standard drinks of alcohol per week. She reports that she does not use drugs.  ROS: Pertinent ROS in HPI  Physical Exam: There were no vitals taken for this visit.  Constitutional:  Well nourished. Alert and oriented, No acute distress. HEENT: [place] AT, moist mucus membranes.  Trachea midline, no masses. Cardiovascular: No clubbing, cyanosis, or edema. Respiratory: Normal respiratory effort, no increased work of breathing. GU: No CVA tenderness.  No bladder fullness or masses. Vulvovaginal atrophy w/ pallor, loss of rugae, introital retraction, excoriations.  Vulvar thinning, fusion of labia, clitoral hood retraction, prominent urethral meatus.   *** external genitalia, *** pubic hair distribution, no lesions.  Normal urethral meatus, no lesions, no prolapse, no discharge.   No urethral masses, tenderness and/or tenderness. No bladder fullness, tenderness or masses. *** vagina mucosa, ***  estrogen effect, no discharge, no lesions, *** pelvic support, *** cystocele and *** rectocele noted.  No cervical motion tenderness.  Uterus is freely mobile and non-fixed.  No adnexal/parametria masses or tenderness noted.  Anus and perineum are without rashes or lesions.   ***  Neurologic: Grossly intact, no focal deficits, moving all 4 extremities. Psychiatric: Normal mood and affect.    Laboratory Data: Lab Results  Component Value Date   WBC 11.7 (H) 02/28/2022   HGB 11.5 (L) 02/28/2022  HCT 34.4 (L) 02/28/2022   MCV 94.2 02/28/2022   PLT 170 02/28/2022    Lab Results  Component Value Date   CREATININE 0.89 02/28/2022    Lab Results  Component Value Date   HGBA1C 5.7 12/12/2021    Lab Results  Component Value Date   TSH 0.886 02/27/2022       Component Value Date/Time   CHOL 342 (H) 12/28/2021 0936   HDL 57.70 12/28/2021 0936   CHOLHDL 6 12/28/2021 0936   VLDL 59.8 (H) 12/28/2021 0936   LDLCALC 103 (H) 11/03/2013 0925    Lab Results  Component Value Date   AST 20 02/14/2022   Lab Results  Component Value Date   ALT 13 02/14/2022  I have reviewed the labs.   Pertinent Imaging: ***   Assessment & Plan:    1. Mixed incontinence -She has had some improvement on the oxybutynin, but she has a new symptom of urge incontinence at night -UA with pyuria bacteriuria -I will send the urine for culture to rule out subclinical infection -She will take AZO OTC if she happens to experiencing worsening of symptoms before urine culture is available -She will need antibiotics that do not have a gelcap as she has the Alphagan issue  No follow-ups on file.  These notes generated with voice recognition software. I apologize for typographical errors.  Cloretta Ned  [facility] [address]  [address] [place], [place] [address] [phone]

## 2022-03-23 ENCOUNTER — Ambulatory Visit: Payer: Medicare Other | Admitting: Urology

## 2022-03-23 DIAGNOSIS — N3946 Mixed incontinence: Secondary | ICD-10-CM

## 2022-03-31 ENCOUNTER — Emergency Department
Admission: EM | Admit: 2022-03-31 | Discharge: 2022-03-31 | Disposition: A | Discharge status: Home / Self Care | Payer: Medicare Other | Attending: Physician Assistant | Admitting: Physician Assistant

## 2022-03-31 DIAGNOSIS — Z5321 Procedure and treatment not carried out due to patient leaving prior to being seen by health care provider: Secondary | ICD-10-CM | POA: Diagnosis not present

## 2022-03-31 DIAGNOSIS — R220 Localized swelling, mass and lump, head: Secondary | ICD-10-CM | POA: Diagnosis not present

## 2022-03-31 DIAGNOSIS — T783XXA Angioneurotic edema, initial encounter: Secondary | ICD-10-CM | POA: Diagnosis not present

## 2022-03-31 MED ORDER — DEXAMETHASONE SODIUM PHOSPHATE 10 MG/ML IJ SOLN
10.0000 mg | Freq: Once | INTRAMUSCULAR | Status: AC
  Administered 2022-03-31: 10 mg via INTRAMUSCULAR
  Filled 2022-03-31: qty 1

## 2022-03-31 MED ORDER — DIPHENHYDRAMINE HCL 25 MG PO CAPS
25.0000 mg | ORAL_CAPSULE | Freq: Once | ORAL | Status: AC
  Administered 2022-03-31: 25 mg via ORAL
  Filled 2022-03-31: qty 1

## 2022-03-31 MED ORDER — FAMOTIDINE 20 MG PO TABS
40.0000 mg | ORAL_TABLET | Freq: Once | ORAL | Status: AC
  Administered 2022-03-31: 40 mg via ORAL
  Filled 2022-03-31: qty 2

## 2022-03-31 NOTE — ED Notes (Signed)
Pt with husband to desk, states feeling better and no longer wishes to stay. Visualized walking out of lobby without difficulty.

## 2022-03-31 NOTE — ED Provider Triage Note (Signed)
Emergency Medicine Provider Triage Evaluation Note   , a 71 y.o. female  was evaluated in triage.  Pt complains of lateral tongue swelling.  With idiopathic angioedema initiated today, presents to the ED about an hour after she began to experience swelling to the right lateral side of her tongue.  Denies any known triggers or contacts.  She took 25 mg of Benadryl prior to arrival.  She presents to the ED in no acute distress, she is able to control oral secretions and denies any chest pain, shortness of breath, or anxiety.  Review of Systems  Positive: Tongue swelling Negative: SOB, resp distress  Physical Exam  BP (!) 154/97 (BP Location: Left Arm)   Pulse 87   Temp 98.9 F (37.2 C) (Oral)   Resp 18   Ht 5\' 6"  (1.676 m)   Wt 91 kg   SpO2 97%   BMI 32.38 kg/m  Gen:   Awake, no distress  NAD Resp:  Normal effort CTA MSK:   Moves extremities without difficulty  Other:  some subtle soft tissue swelling to the right lateral aspect of the tongue.  No brawny sublingual edema is appreciated.  Uvula is midline and tonsils are flat.  Medical Decision Making  Medically screening exam initiated at 5:29 PM.  Appropriate orders placed.  TIMICA MARCOM was informed that the remainder of the evaluation will be completed by another provider, this initial triage assessment does not replace that evaluation, and the importance of remaining in the ED until their evaluation is complete.  Patient to the ED for evaluation of recurrent, idiopathic tongue swelling.   [name], PA-C 03/31/22 1733

## 2022-03-31 NOTE — ED Triage Notes (Signed)
Pt from home with husband who reports about 1hr ago she began to notice the rt side of her tongue is swelling. Pt took 2 PO benadryl at home PTA. Pt reports this has occurred 10 times in the last year with last being 5 weeks ago. Has epi pen but did not use.

## 2022-04-03 NOTE — Progress Notes (Unsigned)
04/04/2022 2:00 PM   Sharon [name] 1950/08/28 [id number]  Referring provider: Judy Pimple, MD [address] [place],  [place] [address]  Urological history: 1. Mixed incontinence -contributing factors of age, pedal edema, diuretics, HTN, anxiety, obesity, history of smoking and alcohol consumption -PVR 0 mL -oxybutynin XL 10 mg daily    Chief Complaint  Patient presents with   Urinary Incontinence    HPI: Sharon [name] is a 71 y.o. female who presents today for 6 week follow up after starting oxybutynin.  At her visit on 02/07/2022, she was having 1 to 7 daytime voids, 1 to 2 night time urination.  She had severe urgency prior to the medication.  She had mild urgency since she started the medication.  She has mixed urinary incontinence.  She is leaking 3 more times weekly.  She wears 2 absorbent pads daily.  She does engage in bathroom mapping and decrease in fluid intake at times.  She has noticed that last few days, she has been experiencing some mild urge incontinence at night.  UA 11-30 WBCs and moderate bacteria.  PVR 0 mL.  Urine culture MUF.    She has been admitted to the hospital twice since she last saw me for allergic reactions.   She is having 1-7 daytime urinations, 1-2 nocturia with a severe urge to urinate.  She is having UI and SUI.  He is having 3 or more leakage episodes weekly and is wearing a panty liner, absorbent pad and depends daily.  Patient denies any modifying or aggravating factors.  Patient denies any gross hematuria, dysuria or suprapubic/flank pain.  Patient denies any fevers, chills, nausea or vomiting.    She has seen an allergist and they have started her on new medications.  She states she only drinks water and occasional unsweet tea.  PVR 22 mL  She is taking oxybutynin XL 10 mg daily.  She is not having severe dry eye, dry mouth and her fecal incontinence has abated.   PMH: Past Medical History:  Diagnosis Date    Angio-edema    Anxiety    Cataract    GERD (gastroesophageal reflux disease)    Hyperlipidemia    Hypertension    Hypothyroidism    Vitamin D deficiency     Surgical History: Past Surgical History:  Procedure Laterality Date   CATARACT EXTRACTION W/ INTRAOCULAR LENS IMPLANT Right 12/29/2016   Dr. Mia Creek   CATARACT EXTRACTION W/ INTRAOCULAR LENS IMPLANT Left 01/15/2017   Dr. Mia Creek   COLONOSCOPY     LEEP     TUBAL LIGATION     VEIN LIGATION AND STRIPPING     x2    Home Medications:  Allergies as of 04/04/2022       Reactions   Cozaar [losartan] Swelling   Zinc Gelatin [zinc] Anaphylaxis   Alpha-gal    Amlodipine Besylate    REACTION: edema   Atorvastatin    REACTION: muscle pain   Beef Allergy    Cat Hair Extract    Dust Mite Extract    Neomycin-bacitracin Zn-polymyx    REACTION: rash Per pt not sure if from meds or not        Medication List        Accurate as of April 04, 2022  2:00 PM. If you have any questions, ask your nurse or doctor.          albuterol 108 (90 Base) MCG/ACT inhaler Commonly known  as: ProAir HFA TAKE 2 PUFFS BY MOUTH EVERY 6 HOURS AS NEEDED FOR WHEEZE OR SHORTNESS OF BREATH   aspirin 81 MG tablet Take 81 mg by mouth daily.   calcium carbonate 750 MG chewable tablet Commonly known as: TUMS EX Chew 2 tablets by mouth at bedtime.   diphenhydrAMINE 25 MG tablet Commonly known as: Benadryl Allergy Take 1 tablet (25 mg total) by mouth every 6 (six) hours as needed.   EPINEPHrine 0.3 mg/0.3 mL Soaj injection Commonly known as: EPI-PEN Inject 0.3 mg into the muscle as needed for anaphylaxis.   fluticasone 50 MCG/ACT nasal spray Commonly known as: FLONASE USE 2 SPRAYS IN BOTH  NOSTRILS DAILY   folic acid 1 MG tablet Commonly known as: FOLVITE Take 1 tablet (1 mg total) by mouth daily.   hydrALAZINE 25 MG tablet Commonly known as: APRESOLINE Take 0.5 tablets (12.5 mg total) by mouth every 8 (eight)  hours.   hydrochlorothiazide 25 MG tablet Commonly known as: HYDRODIURIL Take 1 tablet (25 mg total) by mouth daily.   levothyroxine 50 MCG tablet Commonly known as: SYNTHROID TAKE 1 TABLET BY MOUTH DAILY  BEFORE BREAKFAST   metoprolol tartrate 25 MG tablet Commonly known as: LOPRESSOR Take 1 tablet (25 mg total) by mouth 2 (two) times daily.   oxybutynin 10 MG 24 hr tablet Commonly known as: DITROPAN-XL Take one tablet am and pm What changed:  how much to take how to take this when to take this additional instructions Changed by: Micaila Ziemba, PA-C   pantoprazole 40 MG tablet Commonly known as: PROTONIX TAKE 1 TABLET BY MOUTH DAILY   rosuvastatin 20 MG tablet Commonly known as: Crestor Take 1 tablet (20 mg total) by mouth daily.   sertraline 100 MG tablet Commonly known as: ZOLOFT TAKE 1 TABLET BY MOUTH DAILY   thiamine 100 MG tablet Commonly known as: Vitamin B-1 Take 1 tablet (100 mg total) by mouth daily.   Vitamin D 1000 units capsule Take 1,000 Units by mouth daily.        Allergies:  Allergies  Allergen Reactions   Cozaar [Losartan] Swelling   Zinc Gelatin [Zinc] Anaphylaxis   Alpha-Gal    Amlodipine Besylate     REACTION: edema   Atorvastatin     REACTION: muscle pain   Beef Allergy    Cat Hair Extract    Dust Mite Extract    Neomycin-Bacitracin Zn-Polymyx     REACTION: rash Per pt not sure if from meds or not    Family History: Family History  Problem Relation Age of Onset   Transient ischemic attack Mother    Hypertension Mother    Hypertension Father    Colon cancer Neg Hx    Esophageal cancer Neg Hx    Rectal cancer Neg Hx    Stomach cancer Neg Hx     Social History:  reports that she quit smoking about 25 years ago. Her smoking use included cigarettes. She has never used smokeless tobacco. She reports current alcohol use of about 4.0 standard drinks of alcohol per week. She reports that she does not use  drugs.  ROS: Pertinent ROS in HPI  Physical Exam: BP 137/86   Pulse 92   Ht 5\' 6"  (1.676 m)   Wt 210 lb (95.3 kg)   BMI 33.89 kg/m   Constitutional:  Well nourished. Alert and oriented, No acute distress. HEENT: [place] AT, moist mucus membranes.  Trachea midline Cardiovascular: No clubbing, cyanosis, or edema. Respiratory: Normal respiratory  effort, no increased work of breathing. Neurologic: Grossly intact, no focal deficits, moving all 4 extremities. Psychiatric: Normal mood and affect.    Laboratory Data: Lab Results  Component Value Date   WBC 11.7 (H) 02/28/2022   HGB 11.5 (L) 02/28/2022   HCT 34.4 (L) 02/28/2022   MCV 94.2 02/28/2022   PLT 170 02/28/2022    Lab Results  Component Value Date   CREATININE 0.89 02/28/2022    Lab Results  Component Value Date   HGBA1C 5.7 12/12/2021    Lab Results  Component Value Date   TSH 0.886 02/27/2022       Component Value Date/Time   CHOL 342 (H) 12/28/2021 0936   HDL 57.70 12/28/2021 0936   CHOLHDL 6 12/28/2021 0936   VLDL 59.8 (H) 12/28/2021 0936   LDLCALC 103 (H) 11/03/2013 0925    Lab Results  Component Value Date   AST 20 02/14/2022   Lab Results  Component Value Date   ALT 13 02/14/2022  I have reviewed the labs.   Pertinent Imaging:  04/04/22 13:28  Scan Result 22     Assessment & Plan:    1. Mixed incontinence -Since she has severe issues with allergies I will not add a new OAB agent at this time but rather increase her oxybutynin XL 10 mg to twice daily to see if we can get better control. -Encouraged her to have fluid restrictions in the evening  Return in about 6 weeks (around 05/16/2022) for PVR and OAB questionnaire.  These notes generated with voice recognition software. I apologize for typographical errors.  Cloretta Ned  [facility] [address]  [address] [place], [place] [address] [phone]

## 2022-04-04 ENCOUNTER — Encounter: Payer: Self-pay | Admitting: Urology

## 2022-04-04 ENCOUNTER — Encounter: Payer: Self-pay | Admitting: Cardiology

## 2022-04-04 ENCOUNTER — Ambulatory Visit: Payer: Medicare Other | Admitting: Urology

## 2022-04-04 VITALS — BP 137/86 | HR 92 | Ht 66.0 in | Wt 210.0 lb

## 2022-04-04 DIAGNOSIS — R159 Full incontinence of feces: Secondary | ICD-10-CM

## 2022-04-04 DIAGNOSIS — N3946 Mixed incontinence: Secondary | ICD-10-CM | POA: Diagnosis not present

## 2022-04-04 LAB — BLADDER SCAN AMB NON-IMAGING: Scan Result: 22

## 2022-04-04 MED ORDER — OXYBUTYNIN CHLORIDE ER 10 MG PO TB24: ORAL_TABLET | ORAL | 2 refills | Status: DC

## 2022-04-18 DIAGNOSIS — Z961 Presence of intraocular lens: Secondary | ICD-10-CM | POA: Diagnosis not present

## 2022-04-18 DIAGNOSIS — H524 Presbyopia: Secondary | ICD-10-CM | POA: Diagnosis not present

## 2022-04-18 DIAGNOSIS — H43813 Vitreous degeneration, bilateral: Secondary | ICD-10-CM | POA: Diagnosis not present

## 2022-04-18 DIAGNOSIS — H33311 Horseshoe tear of retina without detachment, right eye: Secondary | ICD-10-CM | POA: Diagnosis not present

## 2022-04-22 ENCOUNTER — Emergency Department
Admission: EM | Admit: 2022-04-22 | Discharge: 2022-04-22 | Disposition: A | Discharge status: Home / Self Care | Payer: Medicare Other | Attending: Emergency Medicine | Admitting: Emergency Medicine

## 2022-04-22 ENCOUNTER — Other Ambulatory Visit: Payer: Self-pay

## 2022-04-22 DIAGNOSIS — T783XXA Angioneurotic edema, initial encounter: Secondary | ICD-10-CM | POA: Diagnosis not present

## 2022-04-22 DIAGNOSIS — I10 Essential (primary) hypertension: Secondary | ICD-10-CM | POA: Insufficient documentation

## 2022-04-22 DIAGNOSIS — R220 Localized swelling, mass and lump, head: Secondary | ICD-10-CM | POA: Diagnosis present

## 2022-04-22 MED ORDER — PREDNISONE 20 MG PO TABS: 60.0000 mg | ORAL_TABLET | Freq: Every day | ORAL | 0 refills | Status: AC

## 2022-04-22 MED ORDER — METHYLPREDNISOLONE SODIUM SUCC 125 MG IJ SOLR
125.0000 mg | Freq: Once | INTRAMUSCULAR | Status: AC
  Administered 2022-04-22: 125 mg via INTRAVENOUS
  Filled 2022-04-22: qty 2

## 2022-04-22 MED ORDER — DIPHENHYDRAMINE HCL 50 MG/ML IJ SOLN
50.0000 mg | Freq: Once | INTRAMUSCULAR | Status: AC
  Administered 2022-04-22: 50 mg via INTRAVENOUS
  Filled 2022-04-22: qty 1

## 2022-04-22 MED ORDER — EPINEPHRINE 0.3 MG/0.3ML IJ SOAJ
0.3000 mg | Freq: Once | INTRAMUSCULAR | Status: AC
  Administered 2022-04-22: 0.3 mg via INTRAMUSCULAR
  Filled 2022-04-22: qty 0.3

## 2022-04-22 MED ORDER — FAMOTIDINE IN NACL 20-0.9 MG/50ML-% IV SOLN
20.0000 mg | Freq: Once | INTRAVENOUS | Status: AC
  Administered 2022-04-22: 20 mg via INTRAVENOUS
  Filled 2022-04-22: qty 50

## 2022-04-22 NOTE — ED Notes (Signed)
Patient states she is feeling better. Swelling is improving. Patient's speech has improved as well.

## 2022-04-22 NOTE — ED Provider Notes (Signed)
[facility] Provider Note    Event Date/Time   First MD Initiated Contact with Patient 04/22/22 [id number]     (approximate)   History   Chief Complaint Allergic Reaction and Oral Swelling   HPI  Sharon [name] is a 71 y.o. female with past medical history of hypertension, hyperlipidemia, and recurrent angioedema with alpha gal allergy who presents to the ED complaining of oral swelling.  Patient reports that around midnight she began to notice swelling around the right side of her tongue.  She states this is similar to prior episodes of angioedema, but not as severe as when she required intubation in the past.  She denies any difficulty breathing, does not feel like there is any swelling around the back of her throat or around her lips.  She has not taken any medicine for this prior to arrival.     Physical Exam   Triage Vital Signs: ED Triage Vitals  Enc Vitals Group     BP 04/22/22 0226 (!) 195/105     Pulse Rate 04/22/22 0226 81     Resp 04/22/22 0226 18     Temp 04/22/22 0226 98.9 F (37.2 C)     Temp src --      SpO2 04/22/22 0226 94 %     Weight 04/22/22 0223 210 lb (95.3 kg)     Height 04/22/22 0223 5\' 6"  (1.676 m)     Head Circumference --      Peak Flow --      Pain Score 04/22/22 0223 0     Pain Loc --      Pain Edu? --      Excl. in GC? --     Most recent vital signs: Vitals:   04/22/22 0426 04/22/22 0652  BP: (!) 179/95 (!) 175/96  Pulse: 82 78  Resp: 14 14  Temp:  98.4 F (36.9 C)  SpO2: 97% 95%    Constitutional: Alert and oriented. Eyes: Conjunctivae are normal. Head: Atraumatic. Nose: No congestion/rhinnorhea. Mouth/Throat: Mucous membranes are moist.  Right tongue angioedema noted, no posterior oropharyngeal edema noted, phonation clear. Cardiovascular: Normal rate, regular rhythm. Grossly normal heart sounds.  2+ radial pulses bilaterally. Respiratory: Normal respiratory effort.  No retractions. Lungs  CTAB. Gastrointestinal: Soft and nontender. No distention. Musculoskeletal: No lower extremity tenderness nor edema.  Neurologic:  Normal speech and language. No gross focal neurologic deficits are appreciated.    ED Results / Procedures / Treatments   Labs (all labs ordered are listed, but only abnormal results are displayed) Labs Reviewed - No data to display   PROCEDURES:  Critical Care performed: No  Procedures   MEDICATIONS ORDERED IN ED: Medications  methylPREDNISolone sodium succinate (SOLU-MEDROL) 125 mg/2 mL injection 125 mg (125 mg Intravenous Given 04/22/22 0244)  diphenhydrAMINE (BENADRYL) injection 50 mg (50 mg Intravenous Given 04/22/22 0244)  famotidine (PEPCID) IVPB 20 mg premix (0 mg Intravenous Stopped 04/22/22 0314)  EPINEPHrine (EPI-PEN) injection 0.3 mg (0.3 mg Intramuscular Given 04/22/22 0245)     IMPRESSION / MDM / ASSESSMENT AND PLAN / ED COURSE  I reviewed the triage vital signs and the nursing notes.                              71 y.o. female with past medical history of hypertension, hyperlipidemia, and recurrent angioedema with alpha gal allergy who presents to the ED complaining of gradually worsening tongue  swelling on the right over the past 2 hours prior to arrival.  Patient's presentation is most consistent with acute presentation with potential threat to life or bodily function.  Differential diagnosis includes, but is not limited to, angioedema, allergic reaction, alpha gal allergy, anaphylaxis.  Patient nontoxic-appearing and in no acute distress, vital signs remarkable for hypertension.  She does have significant swelling of her right tongue but no apparent involvement of her posterior oropharynx, phonation clear with no difficulty breathing at this time.  She has been seen by an allergist for these recurrent episodes, has been told it is related to alpha gal allergy versus ongoing reaction to previously prescribed ARB.  She has  previously responded to medications for anaphylaxis and we will give a dose of epinephrine today in addition to IV Solu-Medrol, Pepcid, and Benadryl.  She is protecting her airway and we will closely monitor here in the ED for improvement.  Right tongue swelling gradually improving over the course of 4 hours, patient continues to breathe comfortably here in the ED and is in no distress.  She is requesting to be discharged home, which is reasonable given improving symptoms with near resolution of swelling.  She was counseled to follow-up with her allergist and to return to the ED for new or worsening symptoms, patient agrees with plan.      FINAL CLINICAL IMPRESSION(S) / ED DIAGNOSES   Final diagnoses:  Angioedema, initial encounter     Rx / DC Orders   ED Discharge Orders     None        Note:  This document was prepared using Dragon voice recognition software and may include unintentional dictation errors.   Chesley Noon, MD 04/22/22 [phone]

## 2022-04-22 NOTE — ED Triage Notes (Signed)
Pt reports sudden onset tongue swelling ~0000. Hx of same with unknown origin. Pt with thick speech but maintaining secretions. Breathing unlabored with symmetric chest rise and fall. No benadryl or epi at home.

## 2022-05-07 NOTE — Progress Notes (Unsigned)
Cardiology Office Note  Date:  05/08/2022   ID:  Sharon [name], DOB [birth year], MRN [id number]  PCP:  Abner Greenspan, MD   Chief Complaint  Patient presents with   3 month follow up     Patient c/o palpitations, chest pressure comes and goes & dizziness. Medications reviewed by the patient verbally.     HPI:  [name] is a 72 year old woman with past medical history of Hypertension Hx of palpitations in 1980s, "MVP" Smoker 26 years recurrent angioedema with alpha gal allergy, prior intubation December 2022, numerous trips to the emergency room ARB previously held, placed on allergy list Who presents for follow-up of her dizziness/lightheadedness  Last seen by myself in clinic May 2022 Seen by one of our providers October 2023  Notes indicating frequent episodes of angioedema, seen in the emergency room  Intubated on one occasion for airway protection Frequently treated with steroids, Benadryl, Pepcid Off ARB  Reports she is sedentary Worsening lightheadedness, balance issues over the past several years Worse with change in position, sometimes with walking Husband reports that ambulation is poor, gait instability, will start going forward, hard to stop herself Seem to get worse after she was intubated for 4 days for angioedema Tries to walk her driveway with her husband for exercise Has had prior history of falls  Cardiac testing reviewed Echo showed LVEF 60 to 78%, grade 1 diastolic dysfunction, mild calcification of the aortic valve.   Heart monitor showed normal sinus rhythm 82 Supraventricular Tachycardia runs occurred, the run with the fastest interval lasting 9 beats with a max rate of 193 bpm, the longest lasting 19 beats with an avg rate of 157 bpm. , Not patient triggered Patient triggered events associated with normal sinus rhythm, rare PVC Was started on metoprolol tartrate 25 twice daily  Does not check blood pressure at home No recordings of low  blood pressure Typically blood pressure 1 46-9 30 systolic Does not want to use a cane for balance  Had rib fx in march and has had symptoms of dizziness since then  Fall happened when getting up too fast  No head injury  Saw NP Baity and noted more of a sense of imbalance then spinning and worse orthostatic   Lab work reviewed Lab Results  Component Value Date   CHOL 342 (H) 12/28/2021   HDL 57.70 12/28/2021   LDLCALC 103 (H) 11/03/2013   TRIG 299.0 (H) 12/28/2021  Tolerating Crestor  EKG personally reviewed by myself on todays visit Shows normal sinus rhythm rate 66 bpm no significant ST-T wave changes  PMH:   has a past medical history of Angio-edema, Anxiety, Cataract, GERD (gastroesophageal reflux disease), Hyperlipidemia, Hypertension, Hypothyroidism, and Vitamin D deficiency.  PSH:    Past Surgical History:  Procedure Laterality Date   CATARACT EXTRACTION W/ INTRAOCULAR LENS IMPLANT Right 12/29/2016   Dr. Darleen Crocker   CATARACT EXTRACTION W/ INTRAOCULAR LENS IMPLANT Left 01/15/2017   Dr. Darleen Crocker   COLONOSCOPY     LEEP     TUBAL LIGATION     VEIN LIGATION AND STRIPPING     x2    Current Outpatient Medications  Medication Sig Dispense Refill   albuterol (PROAIR HFA) 108 (90 Base) MCG/ACT inhaler TAKE 2 PUFFS BY MOUTH EVERY 6 HOURS AS NEEDED FOR WHEEZE OR SHORTNESS OF BREATH 8.5 each 2   aspirin 81 MG tablet Take 81 mg by mouth daily.     calcium carbonate (TUMS EX) 750 MG chewable  tablet Chew 2 tablets by mouth at bedtime.     Cholecalciferol (VITAMIN D) 1000 UNITS capsule Take 1,000 Units by mouth daily.     diphenhydrAMINE (BENADRYL ALLERGY) 25 MG tablet Take 1 tablet (25 mg total) by mouth every 6 (six) hours as needed. 10 tablet 0   EPINEPHrine 0.3 mg/0.3 mL IJ SOAJ injection Inject 0.3 mg into the muscle as needed for anaphylaxis. 1 each 1   fluticasone (FLONASE) 50 MCG/ACT nasal spray USE 2 SPRAYS IN BOTH  NOSTRILS DAILY 48 g 1   folic acid (FOLVITE) 1  MG tablet Take 1 tablet (1 mg total) by mouth daily. 30 tablet 1   hydrochlorothiazide (HYDRODIURIL) 25 MG tablet Take 1 tablet (25 mg total) by mouth daily. 90 tablet 1   levothyroxine (SYNTHROID) 50 MCG tablet TAKE 1 TABLET BY MOUTH DAILY  BEFORE BREAKFAST 90 tablet 3   metoprolol tartrate (LOPRESSOR) 25 MG tablet Take 1 tablet (25 mg total) by mouth 2 (two) times daily. 180 tablet 3   oxybutynin (DITROPAN-XL) 10 MG 24 hr tablet Take one tablet am and pm 180 tablet 2   pantoprazole (PROTONIX) 40 MG tablet TAKE 1 TABLET BY MOUTH DAILY 90 tablet 3   rosuvastatin (CRESTOR) 20 MG tablet Take 1 tablet (20 mg total) by mouth daily. 90 tablet 3   sertraline (ZOLOFT) 100 MG tablet TAKE 1 TABLET BY MOUTH DAILY 90 tablet 3   thiamine (VITAMIN B-1) 100 MG tablet Take 1 tablet (100 mg total) by mouth daily. 30 tablet 1   No current facility-administered medications for this visit.     Allergies:   Cozaar [losartan], Zinc gelatin [zinc], Alpha-gal, Amlodipine besylate, Atorvastatin, Beef allergy, Cat hair extract, Dust mite extract, and Neomycin-bacitracin zn-polymyx   Social History:  The patient  reports that she quit smoking about 26 years ago. Her smoking use included cigarettes. She has never used smokeless tobacco. She reports current alcohol use of about 4.0 standard drinks of alcohol per week. She reports that she does not use drugs.   Family History:   family history includes Hypertension in her father and mother; Transient ischemic attack in her mother.    Review of Systems: Review of Systems  Constitutional: Negative.   HENT: Negative.    Respiratory: Negative.    Cardiovascular: Negative.   Gastrointestinal: Negative.   Musculoskeletal: Negative.   Neurological: Negative.        Lightheadedness, gait instability  Psychiatric/Behavioral: Negative.    All other systems reviewed and are negative.   PHYSICAL EXAM: VS:  BP 130/68 (BP Location: Left Arm, Patient Position: Sitting, Cuff  Size: Normal)   Pulse 66   Ht 5\' 6"  (1.676 m)   Wt 214 lb 6 oz (97.2 kg)   SpO2 97%   BMI 34.60 kg/m  , BMI Body mass index is 34.6 kg/m. Constitutional:  oriented to person, place, and time. No distress.  HENT:  Head: Grossly normal Eyes:  no discharge. No scleral icterus.  Neck: No JVD, no carotid bruits  Cardiovascular: Regular rate and rhythm, no murmurs appreciated Pulmonary/Chest: Clear to auscultation bilaterally, no wheezes or rails Abdominal: Soft.  no distension.  no tenderness.  Musculoskeletal: Normal range of motion Neurological:  normal muscle tone. Coordination normal. No atrophy Skin: Skin warm and dry Psychiatric: normal affect, pleasant   Recent Labs: 02/14/2022: ALT 13 02/27/2022: TSH 0.886 02/28/2022: BUN 16; Creatinine, Ser 0.89; Hemoglobin 11.5; Magnesium 2.1; Platelets 170; Potassium 4.2; Sodium 137    Lipid Panel Lab  Results  Component Value Date   CHOL 342 (H) 12/28/2021   HDL 57.70 12/28/2021   LDLCALC 103 (H) 11/03/2013   TRIG 299.0 (H) 12/28/2021      Wt Readings from Last 3 Encounters:  05/08/22 214 lb 6 oz (97.2 kg)  04/22/22 210 lb (95.3 kg)  04/04/22 210 lb (95.3 kg)      ASSESSMENT AND PLAN:  Problem List Items Addressed This Visit       Cardiology Problems   Essential hypertension   Relevant Orders   EKG 12-Lead   SVT (supraventricular tachycardia) - Primary   Relevant Orders   EKG 12-Lead     Other   Palpitations   Relevant Orders   EKG 12-Lead   Other Visit Diagnoses     Lower extremity edema       Relevant Orders   EKG 12-Lead     Lightheadedness Atypical in nature, no documentation of low blood pressure or arrhythmia Recommend consultation with neurology for further evaluation Husband reports symptoms worse after intubation with general anesthesia for 4 days Sedentary baseline, does not want to use a cane for stability Recommend regular walking program, exercise for leg strengthening Would likely benefit  from regular physical therapy for gait stability  Essential hypertension Recommend she closely monitor blood pressure at home Will tend to run it slightly higher given her symptoms of chronic lightheadedness  Hyperlipidemia Recommend she continue Crestor, we will add Zetia 10 mg daily    Total encounter time more than 40 minutes  Greater than 50% was spent in counseling and coordination of care with the patient    Signed, Dossie Arbour, M.D., Ph.D. [facility] [place], [place] [phone]

## 2022-05-08 ENCOUNTER — Ambulatory Visit: Payer: Medicare Other | Attending: Cardiovascular Disease | Admitting: Cardiovascular Disease

## 2022-05-08 ENCOUNTER — Encounter: Payer: Self-pay | Admitting: Cardiovascular Disease

## 2022-05-08 VITALS — BP 130/68 | HR 66 | Ht 66.0 in | Wt 214.4 lb

## 2022-05-08 DIAGNOSIS — R600 Localized edema: Secondary | ICD-10-CM

## 2022-05-08 DIAGNOSIS — R002 Palpitations: Secondary | ICD-10-CM

## 2022-05-08 DIAGNOSIS — I471 Supraventricular tachycardia, unspecified: Secondary | ICD-10-CM | POA: Diagnosis not present

## 2022-05-08 DIAGNOSIS — I10 Essential (primary) hypertension: Secondary | ICD-10-CM

## 2022-05-08 MED ORDER — EZETIMIBE 10 MG PO TABS: ORAL_TABLET | ORAL | 1 refills | Status: DC

## 2022-05-08 NOTE — Patient Instructions (Addendum)
Medication Instructions:  - Your physician has recommended you make the following change in your medication:   1) START zetia (ezetimibe) 10 mg: - take 1 tablet by mouth once daily for cholesterol  2) Stay on crestor   Monitor your blood pressure readings at home and call/ send a mychart message with readings in the next couple of weeks (you only need to check this once a day, about 1 hour after taking morning medications)  If you need a refill on your cardiac medications before your next appointment, please call your pharmacy.    Lab work: No new labs needed   Testing/Procedures: No new testing needed   Follow-Up: At [facility], you and your health needs are our priority.  As part of our continuing mission to provide you with exceptional heart care, we have created designated Provider Care Teams.  These Care Teams include your primary Cardiologist (physician) and Advanced Practice Providers (APPs -  Physician Assistants and Nurse Practitioners) who all work together to provide you with the care you need, when you need it.  You will need a follow up appointment in 12 months  Providers on your designated Care Team:   Murray Hodgkins, NP Christell Faith, PA-C Cadence Kathlen Mody, [place]  COVID-19 Vaccine Information can be found at: [email or web address] For questions related to vaccine distribution or appointments, please email [email or web address] or call [phone].    Ezetimibe Tablets What is this medication? EZETIMIBE (ez ET i mibe) treats high cholesterol. It works by reducing the amount of cholesterol absorbed from the food you eat. This decreases the amount of bad cholesterol (such as LDL) in your blood. Changes to diet and exercise are often combined with this medication. This medicine may be used for other purposes; ask your health care provider or pharmacist if you have questions. COMMON BRAND NAME(S): Zetia What  should I tell my care team before I take this medication? They need to know if you have any of these conditions: Kidney disease Liver disease Muscle cramps, pain Muscle injury Thyroid disease An unusual or allergic reaction to ezetimibe, other medications, foods, dyes, or preservatives Pregnant or trying to get pregnant Breast-feeding How should I use this medication? Take this medication by mouth. Take it as directed on the prescription label at the same time every day. You can take it with or without food. If it upsets your stomach, take it with food. Keep taking it unless your care team tells you to stop. Take bile acid sequestrants at a different time of day than this medication. Take this medication 2 hours BEFORE or 4 hours AFTER bile acid sequestrants. Talk to your care team about the use of this medication in children. While it may be prescribed for children as young as 10 for selected conditions, precautions do apply. Overdosage: If you think you have taken too much of this medicine contact a poison control center or emergency room at once. NOTE: This medicine is only for you. Do not share this medicine with others. What if I miss a dose? If you miss a dose, take it as soon as you can. If it is almost time for your next dose, take only that dose. Do not take double or extra doses. What may interact with this medication? Do not take this medication with any of the following: Fenofibrate Gemfibrozil This medication may also interact with the following: Antacids Cyclosporine Herbal medications like red yeast rice Other medications to lower cholesterol or triglycerides This list may not describe all possible  interactions. Give your health care provider a list of all the medicines, herbs, non-prescription drugs, or dietary supplements you use. Also tell them if you smoke, drink alcohol, or use illegal drugs. Some items may interact with your medicine. What should I watch for while  using this medication? Visit your care team for regular checks on your progress. Tell your care team if your symptoms do not start to get better or if they get worse. Your care team may tell you to stop taking this medication if you develop muscle problems. If your muscle problems do not go away after stopping this medication, contact your care team. Do not become pregnant while taking this medication. Women should inform their care team if they wish to become pregnant or think they might be pregnant. There is potential for serious harm to an unborn child. Talk to your care team for more information. Do not breast-feed an infant while taking this medication. Taking this medication is only part of a total heart healthy program. Your care team may give you a special diet to follow. Avoid alcohol. Avoid smoking. Ask your care team how much you should exercise. What side effects may I notice from receiving this medication? Side effects that you should report to your doctor or health care provider as soon as possible: Allergic reactions--skin rash, itching or hives, swelling of the face, lips, tongue, or throat Side effects that usually do not require medical attention (report to your doctor or health care provider if they continue or are bothersome): Diarrhea Joint pain This list may not describe all possible side effects. Call your doctor for medical advice about side effects. You may report side effects to FDA at 1-800-FDA-1088. Where should I keep my medication? Keep out of the reach of children and pets. Store at room temperature between 15 and 30 degrees C (59 and 86 degrees F). Protect from moisture. Get rid of any unused medication after the expiration date. NOTE: This sheet is a summary. It may not cover all possible information. If you have questions about this medicine, talk to your doctor, pharmacist, or health care provider.  2023 Elsevier/Gold Standard (2020-04-14 00:00:00)

## 2022-05-15 NOTE — Progress Notes (Unsigned)
05/16/2022 8:42 PM   Sharon [name] 1950/09/05 [id number]  Referring provider: Judy Pimple, MD [address] [place],  [place] [address]  Urological history: 1. Mixed incontinence -contributing factors of age, pedal edema, diuretics, HTN, anxiety, obesity, history of smoking and alcohol consumption -PVR 20 mL -oxybutynin XL 10 mg daily   Chief Complaint  Patient presents with   Urinary Incontinence    HPI: Sharon [name] is a 72 y.o. female who presents today for 6 week follow up after increasing her oxybutynin XL 10 mg twice daily.    She is having 1-7 daytime voids, 1-2 episodes of nocturia.  With severe urge to urinate.  She has both stress and urge incontinence.  She is leaking 3 more times a week.  She wears 3 absorbent pads daily.  She does engage in toilet mapping and fluid restrictions.  Patient denies any modifying or aggravating factors.  Patient denies any gross hematuria, dysuria or suprapubic/flank pain.  Patient denies any fevers, chills, nausea or vomiting.    PVR 20 mL   She is still not at goal with oxybutynin XL 10 mg daily.  PMH: Past Medical History:  Diagnosis Date   Angio-edema    Anxiety    Cataract    GERD (gastroesophageal reflux disease)    Hyperlipidemia    Hypertension    Hypothyroidism    Vitamin D deficiency     Surgical History: Past Surgical History:  Procedure Laterality Date   CATARACT EXTRACTION W/ INTRAOCULAR LENS IMPLANT Right 12/29/2016   Dr. Mia Creek   CATARACT EXTRACTION W/ INTRAOCULAR LENS IMPLANT Left 01/15/2017   Dr. Mia Creek   COLONOSCOPY     LEEP     TUBAL LIGATION     VEIN LIGATION AND STRIPPING     x2    Home Medications:  Allergies as of 05/16/2022       Reactions   Cozaar [losartan] Swelling   Zinc Gelatin [zinc] Anaphylaxis   Alpha-gal    Amlodipine Besylate    REACTION: edema   Atorvastatin    REACTION: muscle pain   Beef Allergy    Cat Hair Extract    Dust Mite  Extract    Neomycin-bacitracin Zn-polymyx    REACTION: rash Per pt not sure if from meds or not        Medication List        Accurate as of May 16, 2022 11:59 PM. If you have any questions, ask your nurse or doctor.          albuterol 108 (90 Base) MCG/ACT inhaler Commonly known as: ProAir HFA TAKE 2 PUFFS BY MOUTH EVERY 6 HOURS AS NEEDED FOR WHEEZE OR SHORTNESS OF BREATH   aspirin 81 MG tablet Take 81 mg by mouth daily.   calcium carbonate 750 MG chewable tablet Commonly known as: TUMS EX Chew 2 tablets by mouth at bedtime.   diphenhydrAMINE 25 MG tablet Commonly known as: Benadryl Allergy Take 1 tablet (25 mg total) by mouth every 6 (six) hours as needed.   EPINEPHrine 0.3 mg/0.3 mL Soaj injection Commonly known as: EPI-PEN Inject 0.3 mg into the muscle as needed for anaphylaxis.   ezetimibe 10 MG tablet Commonly known as: ZETIA Take 1 tablet (10 mg) by mouth once daily   fexofenadine 180 MG tablet Commonly known as: ALLEGRA Take 180 mg by mouth daily.   fluticasone 50 MCG/ACT nasal spray Commonly known as: FLONASE USE 2 SPRAYS IN BOTH  NOSTRILS DAILY   folic acid 1 MG tablet Commonly known as: FOLVITE Take 1 tablet (1 mg total) by mouth daily.   hydrochlorothiazide 25 MG tablet Commonly known as: HYDRODIURIL Take 1 tablet (25 mg total) by mouth daily.   levocetirizine 5 MG tablet Commonly known as: XYZAL Take 5 mg by mouth daily.   levothyroxine 50 MCG tablet Commonly known as: SYNTHROID TAKE 1 TABLET BY MOUTH DAILY  BEFORE BREAKFAST   metoprolol tartrate 25 MG tablet Commonly known as: LOPRESSOR Take 1 tablet (25 mg total) by mouth 2 (two) times daily.   oxybutynin 10 MG 24 hr tablet Commonly known as: DITROPAN-XL Take one tablet am and pm   pantoprazole 40 MG tablet Commonly known as: PROTONIX TAKE 1 TABLET BY MOUTH DAILY   rosuvastatin 20 MG tablet Commonly known as: Crestor Take 1 tablet (20 mg total) by mouth daily.    sertraline 100 MG tablet Commonly known as: ZOLOFT TAKE 1 TABLET BY MOUTH DAILY   thiamine 100 MG tablet Commonly known as: Vitamin B-1 Take 1 tablet (100 mg total) by mouth daily.   Vitamin D 1000 units capsule Take 1,000 Units by mouth daily.        Allergies:  Allergies  Allergen Reactions   Cozaar [Losartan] Swelling   Zinc Gelatin [Zinc] Anaphylaxis   Alpha-Gal    Amlodipine Besylate     REACTION: edema   Atorvastatin     REACTION: muscle pain   Beef Allergy    Cat Hair Extract    Dust Mite Extract    Neomycin-Bacitracin Zn-Polymyx     REACTION: rash Per pt not sure if from meds or not    Family History: Family History  Problem Relation Age of Onset   Transient ischemic attack Mother    Hypertension Mother    Hypertension Father    Colon cancer Neg Hx    Esophageal cancer Neg Hx    Rectal cancer Neg Hx    Stomach cancer Neg Hx     Social History:  reports that she quit smoking about 26 years ago. Her smoking use included cigarettes. She has never used smokeless tobacco. She reports current alcohol use of about 4.0 standard drinks of alcohol per week. She reports that she does not use drugs.  ROS: Pertinent ROS in HPI  Physical Exam: BP (!) 164/104   Pulse (!) 112   Ht 5\' 6"  (1.676 m)   Wt 214 lb (97.1 kg)   BMI 34.54 kg/m   Constitutional:  Well nourished. Alert and oriented, No acute distress. HEENT: [place] AT, moist mucus membranes.  Trachea midline Cardiovascular: No clubbing, cyanosis, or edema. Respiratory: Normal respiratory effort, no increased work of breathing. Neurologic: Grossly intact, no focal deficits, moving all 4 extremities. Psychiatric: Normal mood and affect.   Laboratory Data: N/A  Pertinent Imaging:  05/16/22 13:23  Scan Result 20      Assessment & Plan:    1. Mixed incontinence -She is not at goal with first-line therapies for her mixed urinary incontinence -I discussed one of the third line treatment options with  her, PTNS, and she is very interested in this explained the PTNS provides treatment by indirectly providing electrical stimulation to the nerves responsible for bladder and pelvic floor function - a needle electrode generates an adjustable electrical pulse that travels to the sacral plexus via the tibial nerve which is located in the ankle, among other functions, the sacral nerve plexus regulates bladder and pelvic floor function -  treatment protocol requires once-a-week treatments for 12 weeks, 30 minutes per session and many patients begin to see improvements by the 6th treatment. Patients who respond to treatment may require occasional treatments (~ once every 3 weeks) to sustain improvements. PTNS is a low-risk procedure. The most common side-effects with PTNS treatment are temporary and minor, resulting from the placement of the needle electrode. They include minor bleeding, mild pain and skin inflammation and patients have seen up to an 80% success rate with this form of treatment  Return for PTNS .  These notes generated with voice recognition software. I apologize for typographical errors.  [place], [place] [address]  [place], [place] [address] [phone]

## 2022-05-16 ENCOUNTER — Encounter: Payer: Self-pay | Admitting: Urology

## 2022-05-16 ENCOUNTER — Ambulatory Visit: Payer: Medicare Other | Admitting: Urology

## 2022-05-16 VITALS — BP 164/104 | HR 112 | Ht 66.0 in | Wt 214.0 lb

## 2022-05-16 DIAGNOSIS — N3946 Mixed incontinence: Secondary | ICD-10-CM | POA: Diagnosis not present

## 2022-05-16 LAB — BLADDER SCAN AMB NON-IMAGING: Scan Result: 20

## 2022-05-18 DIAGNOSIS — Z1231 Encounter for screening mammogram for malignant neoplasm of breast: Secondary | ICD-10-CM | POA: Diagnosis not present

## 2022-05-18 LAB — HM MAMMOGRAPHY

## 2022-05-19 ENCOUNTER — Encounter: Payer: Self-pay | Admitting: Family Medicine

## 2022-05-26 NOTE — Telephone Encounter (Signed)
Pt called back. Working on finding out about treatment to see if Insurance pays for it.  Will call Morey Hummingbird when [place] hears something or vise versa [phone]

## 2022-05-30 ENCOUNTER — Other Ambulatory Visit: Payer: Self-pay | Admitting: Family Medicine

## 2022-05-31 NOTE — Telephone Encounter (Signed)
Please ask her about this Has she been taking it all along?

## 2022-05-31 NOTE — Telephone Encounter (Signed)
Left VM requesting pt to call the office back 

## 2022-05-31 NOTE — Telephone Encounter (Signed)
Looks like hospital d/c potassium on 04/18/21, please advise

## 2022-06-09 ENCOUNTER — Ambulatory Visit (INDEPENDENT_AMBULATORY_CARE_PROVIDER_SITE_OTHER): Payer: Medicare Other | Admitting: Family Medicine

## 2022-06-09 ENCOUNTER — Encounter: Payer: Self-pay | Admitting: Family Medicine

## 2022-06-09 VITALS — BP 114/80 | HR 74 | Temp 97.1°F | Ht 66.0 in | Wt 211.0 lb

## 2022-06-09 DIAGNOSIS — R300 Dysuria: Secondary | ICD-10-CM | POA: Diagnosis not present

## 2022-06-09 LAB — POC URINALSYSI DIPSTICK (AUTOMATED)
Bilirubin, UA: NEGATIVE
Blood, UA: POSITIVE
Glucose, UA: NEGATIVE
Ketones, UA: NEGATIVE
Nitrite, UA: NEGATIVE
Protein, UA: NEGATIVE
Spec Grav, UA: 1.01 (ref 1.010–1.025)
Urobilinogen, UA: 0.2 E.U./dL
pH, UA: 6 (ref 5.0–8.0)

## 2022-06-09 MED ORDER — SULFAMETHOXAZOLE-TRIMETHOPRIM 800-160 MG PO TABS: 1.0000 | ORAL_TABLET | Freq: Two times a day (BID) | ORAL | 0 refills | Status: DC

## 2022-06-09 NOTE — Progress Notes (Unsigned)
dysuria: burning at end of stream for the last week.   duration of symptoms: 1 week.   abdominal pain:no  fevers: no back pain: no vomiting: no This felt like prev UTI from distant hx.    U/a d/w pt. urine culture pending.  Meds, vitals, and allergies reviewed.   Per HPI unless specifically indicated in ROS section   GEN: nad, alert and oriented HEENT: ncat NECK: supple CV: rrr.  PULM: ctab, no inc wob ABD: soft, +bs, suprapubic area not tender EXT: no edema SKIN: Well-perfused. BACK: no CVA pain

## 2022-06-09 NOTE — Patient Instructions (Signed)
Drink plenty of water and start the antibiotics today.  We'll contact you with your lab report.  Take care.    

## 2022-06-10 LAB — URINE CULTURE
MICRO NUMBER:: 14576258
SPECIMEN QUALITY:: ADEQUATE

## 2022-06-11 ENCOUNTER — Other Ambulatory Visit: Payer: Self-pay | Admitting: Family Medicine

## 2022-06-11 DIAGNOSIS — R300 Dysuria: Secondary | ICD-10-CM | POA: Insufficient documentation

## 2022-06-11 NOTE — Assessment & Plan Note (Signed)
Presumed cystitis.  Nontoxic.  Okay for outpatient follow-up.  Check urine culture.  Urinalysis discussed with patient.  Start Septra.  Routine cautions given to patient.

## 2022-06-19 ENCOUNTER — Ambulatory Visit: Payer: Medicare Other | Admitting: Physician Assistant

## 2022-06-22 ENCOUNTER — Ambulatory Visit: Payer: Medicare Other | Admitting: Physician Assistant

## 2022-06-22 VITALS — Ht 66.0 in | Wt 211.0 lb

## 2022-06-22 DIAGNOSIS — N3946 Mixed incontinence: Secondary | ICD-10-CM

## 2022-06-22 NOTE — Patient Instructions (Signed)

## 2022-06-22 NOTE — Progress Notes (Signed)
PTNS  Session # 1  Health & Social Factors: new persistent cough x several weeks, worsened SUI. Has not yet seen PCP. Caffeine: 1-2 Alcohol: <1 Daytime voids #per day: 5 Night-time voids #per night: 2-3 Urgency: severe Incontinence Episodes #per day: 4-5 Ankle used: left Treatment Setting: 13 Feeling/ Response: sensory Comments: Patient tolerated well. Consent signed.  Performed By: Debroah Loop, PA-C   Follow Up: 1 week for PTNS #2

## 2022-06-23 ENCOUNTER — Ambulatory Visit (INDEPENDENT_AMBULATORY_CARE_PROVIDER_SITE_OTHER): Payer: Medicare Other | Admitting: Family Medicine

## 2022-06-23 ENCOUNTER — Ambulatory Visit (INDEPENDENT_AMBULATORY_CARE_PROVIDER_SITE_OTHER)
Admission: RE | Admit: 2022-06-23 | Discharge: 2022-06-23 | Disposition: A | Discharge status: Home / Self Care | Payer: Medicare Other | Source: Ambulatory Visit | Attending: Family Medicine | Admitting: Family Medicine

## 2022-06-23 ENCOUNTER — Encounter: Payer: Self-pay | Admitting: Family Medicine

## 2022-06-23 VITALS — BP 128/76 | HR 69 | Temp 97.3°F | Ht 66.0 in | Wt 217.4 lb

## 2022-06-23 DIAGNOSIS — R052 Subacute cough: Secondary | ICD-10-CM

## 2022-06-23 DIAGNOSIS — R062 Wheezing: Secondary | ICD-10-CM | POA: Diagnosis not present

## 2022-06-23 DIAGNOSIS — K449 Diaphragmatic hernia without obstruction or gangrene: Secondary | ICD-10-CM | POA: Diagnosis not present

## 2022-06-23 DIAGNOSIS — K219 Gastro-esophageal reflux disease without esophagitis: Secondary | ICD-10-CM

## 2022-06-23 DIAGNOSIS — R059 Cough, unspecified: Secondary | ICD-10-CM | POA: Diagnosis not present

## 2022-06-23 LAB — POCT INFLUENZA A/B
Influenza A, POC: NEGATIVE
Influenza B, POC: NEGATIVE

## 2022-06-23 LAB — POC COVID19 BINAXNOW: SARS Coronavirus 2 Ag: NEGATIVE

## 2022-06-23 MED ORDER — FAMOTIDINE 20 MG PO TABS: 20.0000 mg | ORAL_TABLET | Freq: Two times a day (BID) | ORAL | 0 refills | Status: DC

## 2022-06-23 MED ORDER — BENZONATATE 200 MG PO CAPS: 200.0000 mg | ORAL_CAPSULE | Freq: Three times a day (TID) | ORAL | 1 refills | Status: DC | PRN

## 2022-06-23 NOTE — Progress Notes (Signed)
Subjective:    Patient ID: Sharon [name], female    DOB: 12/19/1950, 72 y.o.   MRN: [id number]  HPI Pt presents for cold/cough symptoms over a mo   Wt Readings from Last 3 Encounters:  06/23/22 217 lb 6 oz (98.6 kg)  06/22/22 211 lb (95.7 kg)  06/09/22 211 lb (95.7 kg)   35.09 kg/m  Vitals:   06/23/22 1232  BP: 128/76  Pulse: 69  Temp: (!) 97.3 F (36.3 C)  SpO2: 96%   Caught a virus from husband ? Over a month ago   Started with cough  Did not feel sick  Some phlegm - clear  Some wheezing  Those were her only symptoms   Did not do a covid test   Now  Ears are stopped up  No pain  No ST  Bad cough - worse at night  Sometimes she feels like lump in throat Not clearning throat  Some phlegm - clear  Not a lot of wheeze (occ inhaler)  Not sob    Some pnd - normal for her but worse than usual  Occ sneeze  No headache  No fever    Otc:  Mucinex DM 400-20 mg  Not taking any benadryl  Xyzal once daily  Allegra once daily  Flonase- not every day   Doing better with her allergic/ ER visits   No prednisone since December    Former smoker Quit in 1998   Results for orders placed or performed in visit on 06/23/22  POC COVID-19  Result Value Ref Range   SARS Coronavirus 2 Ag Negative Negative  POCT Influenza A/B  Result Value Ref Range   Influenza A, POC Negative Negative   Influenza B, POC Negative Negative     Alcohol-not more than 4 drinks per week   Cxr  DG Chest 2 View  Result Date: 06/23/2022 CLINICAL DATA:  ongoing cough for over a month / some clear phlegm occ wheezing EXAM: CHEST - 2 VIEW COMPARISON:  Chest x-ray February 26, 2022. FINDINGS: The heart size and mediastinal contours are within normal limits. Both lungs are clear. No visible pleural effusions or pneumothorax. Moderate hiatal hernia. The visualized skeletal structures are unremarkable. IMPRESSION: 1. No active cardiopulmonary disease. 2. Moderate hiatal hernia.  Electronically Signed   By: Margaretha Sheffield M.D.   On: 06/23/2022 13:26     Patient Active Problem List   Diagnosis Date Noted   Burning with urination 06/11/2022   Angioedema due to angiotensin converting enzyme inhibitor (ACE-I) 02/27/2022   SVT (supraventricular tachycardia) 02/22/2022   Urinary incontinence 12/09/2021   Pedal edema 07/26/2021   Diarrhea 04/27/2021   Angioedema 04/17/2021   Dizziness 08/23/2020   Fatigue 08/23/2020   Hyperglycemia 12/17/2019   Medicare annual wellness visit, subsequent 08/28/2018   Cough 01/28/2018   Welcome to Medicare preventive visit 08/22/2016   Obesity 10/05/2014   Anemia 09/24/2013   Palpitations 03/03/2013   Colon cancer screening 03/03/2013   Hypokalemia 10/09/2011   Routine general medical examination at a health care facility 10/02/2011   Vitamin D deficiency disease 12/28/2010   ARTHRALGIA 08/19/2007   Hypothyroidism 01/31/2007   Hyperlipidemia 01/31/2007   Anxiety state 01/31/2007   Essential hypertension 01/31/2007   GERD 01/31/2007   Past Medical History:  Diagnosis Date   Angio-edema    Anxiety    Cataract    GERD (gastroesophageal reflux disease)    Hyperlipidemia    Hypertension    Hypothyroidism  Vitamin D deficiency    Past Surgical History:  Procedure Laterality Date   CATARACT EXTRACTION W/ INTRAOCULAR LENS IMPLANT Right 12/29/2016   Dr. Darleen Crocker   CATARACT EXTRACTION W/ INTRAOCULAR LENS IMPLANT Left 01/15/2017   Dr. Darleen Crocker   COLONOSCOPY     LEEP     TUBAL LIGATION     VEIN LIGATION AND STRIPPING     x2   Social History   Tobacco Use   Smoking status: Former    Types: Cigarettes    Quit date: 04/24/1996    Years since quitting: 26.1   Smokeless tobacco: Never  Vaping Use   Vaping Use: Never used  Substance Use Topics   Alcohol use: Yes    Alcohol/week: 4.0 standard drinks of alcohol    Types: 4 Shots of liquor per week    Comment: occasional   Drug use: No   Family History   Problem Relation Age of Onset   Transient ischemic attack Mother    Hypertension Mother    Hypertension Father    Colon cancer Neg Hx    Esophageal cancer Neg Hx    Rectal cancer Neg Hx    Stomach cancer Neg Hx    Allergies  Allergen Reactions   Cozaar [Losartan] Swelling   Zinc Gelatin [Zinc] Anaphylaxis   Alpha-Gal    Amlodipine Besylate     REACTION: edema   Atorvastatin     REACTION: muscle pain   Beef Allergy    Cat Hair Extract    Dust Mite Extract    Neomycin-Bacitracin Zn-Polymyx     REACTION: rash Per pt not sure if from meds or not   Current Outpatient Medications on File Prior to Visit  Medication Sig Dispense Refill   albuterol (VENTOLIN HFA) 108 (90 Base) MCG/ACT inhaler TAKE 2 PUFFS BY MOUTH EVERY 6 HOURS AS NEEDED FOR WHEEZE OR SHORTNESS OF BREATH 8.5 each 2   aspirin 81 MG tablet Take 81 mg by mouth daily.     calcium carbonate (TUMS EX) 750 MG chewable tablet Chew 2 tablets by mouth at bedtime.     Cholecalciferol (VITAMIN D) 1000 UNITS capsule Take 1,000 Units by mouth daily.     diphenhydrAMINE (BENADRYL ALLERGY) 25 MG tablet Take 1 tablet (25 mg total) by mouth every 6 (six) hours as needed. 10 tablet 0   EPINEPHrine 0.3 mg/0.3 mL IJ SOAJ injection Inject 0.3 mg into the muscle as needed for anaphylaxis. 1 each 1   ezetimibe (ZETIA) 10 MG tablet Take 1 tablet (10 mg) by mouth once daily 90 tablet 1   fexofenadine (ALLEGRA) 180 MG tablet Take 180 mg by mouth daily.     fluticasone (FLONASE) 50 MCG/ACT nasal spray USE 2 SPRAYS IN BOTH NOSTRILS  DAILY 48 g 1   folic acid (FOLVITE) 1 MG tablet Take 1 tablet (1 mg total) by mouth daily. 30 tablet 1   hydrochlorothiazide (HYDRODIURIL) 25 MG tablet TAKE 1 TABLET (25 MG TOTAL) BY MOUTH DAILY. 90 tablet 1   levocetirizine (XYZAL) 5 MG tablet Take 5 mg by mouth daily.     levothyroxine (SYNTHROID) 50 MCG tablet TAKE 1 TABLET BY MOUTH DAILY  BEFORE BREAKFAST 90 tablet 3   metoprolol tartrate (LOPRESSOR) 25 MG tablet  Take 1 tablet (25 mg total) by mouth 2 (two) times daily. 180 tablet 3   oxybutynin (DITROPAN-XL) 10 MG 24 hr tablet Take one tablet am and pm 180 tablet 2   pantoprazole (PROTONIX) 40 MG  tablet TAKE 1 TABLET BY MOUTH DAILY 90 tablet 3   rosuvastatin (CRESTOR) 20 MG tablet Take 1 tablet (20 mg total) by mouth daily. 90 tablet 3   sertraline (ZOLOFT) 100 MG tablet TAKE 1 TABLET BY MOUTH DAILY 90 tablet 3   thiamine (VITAMIN B-1) 100 MG tablet Take 1 tablet (100 mg total) by mouth daily. 30 tablet 1   No current facility-administered medications on file prior to visit.     Review of Systems  Constitutional:  Negative for activity change, appetite change, fatigue, fever and unexpected weight change.  HENT:  Negative for congestion, ear pain, rhinorrhea, sinus pressure and sore throat.        Globus sensation   Eyes:  Negative for pain, redness and visual disturbance.  Respiratory:  Positive for cough and wheezing. Negative for shortness of breath and stridor.   Cardiovascular:  Negative for chest pain and palpitations.  Gastrointestinal:  Negative for abdominal pain, blood in stool, constipation and diarrhea.  Endocrine: Negative for polydipsia and polyuria.  Genitourinary:  Negative for dysuria, frequency and urgency.  Musculoskeletal:  Negative for arthralgias, back pain and myalgias.  Skin:  Negative for pallor and rash.  Allergic/Immunologic: Negative for environmental allergies.  Neurological:  Negative for dizziness, syncope and headaches.  Hematological:  Negative for adenopathy. Does not bruise/bleed easily.  Psychiatric/Behavioral:  Negative for decreased concentration and dysphoric mood. The patient is not nervous/anxious.        Objective:   Physical Exam Constitutional:      General: She is not in acute distress.    Appearance: Normal appearance. She is well-developed. She is obese. She is not ill-appearing or diaphoretic.  HENT:     Head: Normocephalic and atraumatic.      Right Ear: Tympanic membrane and ear canal normal.     Left Ear: Tympanic membrane and ear canal normal.     Nose:     Comments: Boggy nares    Mouth/Throat:     Mouth: Mucous membranes are moist.     Pharynx: Oropharynx is clear.     Comments: Scant lcear pnd Eyes:     General:        Right eye: No discharge.        Left eye: No discharge.     Conjunctiva/sclera: Conjunctivae normal.     Pupils: Pupils are equal, round, and reactive to light.  Neck:     Thyroid: No thyromegaly.     Vascular: No carotid bruit or JVD.  Cardiovascular:     Rate and Rhythm: Normal rate and regular rhythm.     Heart sounds: Normal heart sounds.     No gallop.  Pulmonary:     Effort: Pulmonary effort is normal. No respiratory distress.     Breath sounds: Normal breath sounds. No stridor. No wheezing, rhonchi or rales.     Comments: Some upper airway sounds  Occ scant rhonchi in bases  No prolonged exp phase Abdominal:     General: There is no distension or abdominal bruit.     Palpations: Abdomen is soft.  Musculoskeletal:     Cervical back: Normal range of motion and neck supple. No tenderness.     Right lower leg: No edema.     Left lower leg: No edema.  Lymphadenopathy:     Cervical: No cervical adenopathy.  Skin:    General: Skin is warm and dry.     Coloration: Skin is not pale.     Findings:  No rash.  Neurological:     Mental Status: She is alert.     Coordination: Coordination normal.     Deep Tendon Reflexes: Reflexes are normal and symmetric. Reflexes normal.  Psychiatric:        Mood and Affect: Mood normal.           Assessment & Plan:   Problem List Items Addressed This Visit       Digestive   GERD    This may be adding to post viral cough syndrome  Taking protonix 40 mg daily   Add pepcid 20 mg bid for 1 mo  Inst to watch diet         Relevant Medications   famotidine (PEPCID) 20 MG tablet     Other   Cough - Primary    This appears to be another  cough syndrome/poss post viral  Cxr -noted clear with mod [facility] Reviewed notes from Dr Darnell Level in 2019   Px tessalon Already taking mucinex dm  Disc sympt care-see AVS Add pepcid bid for better acid control / is already on protonix   Update if not starting to improve in a week or if worsening          Relevant Orders   POC COVID-19 (Completed)   POCT Influenza A/B (Completed)   DG Chest 2 View (Completed)

## 2022-06-23 NOTE — Patient Instructions (Addendum)
Drink lots of fluids   Try the tessalon pearles for cough as needed (talk to pharmacist to make sure they are ok with alpha gal)  Continue current medicines   Add pepcid 20 mg twice daily for a month to control acid (which can cause cough)   Chest xray today  We will message/call you with a result

## 2022-06-23 NOTE — Assessment & Plan Note (Signed)
This may be adding to post viral cough syndrome  Taking protonix 40 mg daily   Add pepcid 20 mg bid for 1 mo  Inst to watch diet

## 2022-06-23 NOTE — Assessment & Plan Note (Addendum)
This appears to be another cough syndrome/poss post viral  Cxr -noted clear with [facility] Reviewed notes from Dr Darnell Level in 2019   Px tessalon Already taking mucinex dm  Disc sympt care-see AVS Add pepcid bid for better acid control / is already on protonix   Update if not starting to improve in a week or if worsening

## 2022-06-26 ENCOUNTER — Ambulatory Visit: Payer: Medicare Other | Admitting: Physician Assistant

## 2022-06-26 DIAGNOSIS — N3946 Mixed incontinence: Secondary | ICD-10-CM | POA: Diagnosis not present

## 2022-06-26 NOTE — Patient Instructions (Signed)

## 2022-06-26 NOTE — Progress Notes (Signed)
PTNS  Session # 2  Health & Social Factors: no change Caffeine: 1 Alcohol: 2-3 a week Daytime voids #per day: 4 Night-time voids #per night: 2 Urgency: strong/severe Incontinence Episodes #per day: 3 Ankle used: right Treatment Setting: 9 Feeling/ Response: sensory Comments: Patient tolerated well  Performed [id number] Smith, CMA Ranbir Chew Magallon-Mariche, RMA  Follow Up: 1 week

## 2022-07-03 ENCOUNTER — Ambulatory Visit: Payer: Medicare Other | Admitting: Physician Assistant

## 2022-07-03 DIAGNOSIS — N3946 Mixed incontinence: Secondary | ICD-10-CM | POA: Diagnosis not present

## 2022-07-03 NOTE — Progress Notes (Signed)
PTNS  Session # 3  Health & Social Factors: no change Caffeine: 2 Alcohol: 0 Daytime voids #per day: 3 Night-time voids #per night: 4-5 Urgency: severe Incontinence Episodes #per day: 3 Ankle used: right Treatment Setting: 2 Feeling/ Response: sensory Comments: Patient tolerated well, though sensitive with needle insertion.  Performed By: Debroah Loop, PA-C and Humberta Magallon-Mariche, CMA  Follow Up: 1 week for PTNS #4

## 2022-07-03 NOTE — Patient Instructions (Signed)
Tracking Your Bladder Symptoms   Patient Name:___________________________________________________  Example: Day   Daytime Voids  Nighttime Voids Urgency for the Day (none, mild, strong, severe) Number of Accidents/ Leaks Beverage Comments  Monday IIII II Strong I Water IIII Coffee  I     Week Starting:____________________________________  Day Daytime  Voids Nighttime  Voids Urgency for the Day (none, mild, strong, severe) Number of Accidents/ Leaks Beverages Comments                                                           This week my symptoms were:  O much better O better O the same O worse   

## 2022-07-10 ENCOUNTER — Ambulatory Visit: Payer: Medicare Other | Admitting: Physician Assistant

## 2022-07-10 DIAGNOSIS — N3946 Mixed incontinence: Secondary | ICD-10-CM | POA: Diagnosis not present

## 2022-07-10 DIAGNOSIS — N3001 Acute cystitis with hematuria: Secondary | ICD-10-CM | POA: Diagnosis not present

## 2022-07-10 DIAGNOSIS — R300 Dysuria: Secondary | ICD-10-CM | POA: Diagnosis not present

## 2022-07-10 LAB — URINALYSIS, COMPLETE
Bilirubin, UA: NEGATIVE
Glucose, UA: NEGATIVE
Ketones, UA: NEGATIVE
Nitrite, UA: NEGATIVE
Specific Gravity, UA: 1.02 (ref 1.005–1.030)
Urobilinogen, Ur: 0.2 mg/dL (ref 0.2–1.0)
pH, UA: 6 (ref 5.0–7.5)

## 2022-07-10 LAB — MICROSCOPIC EXAMINATION: WBC, UA: >30 /hpf — AB (ref 0–5)

## 2022-07-10 MED ORDER — NITROFURANTOIN MONOHYD MACRO 100 MG PO CAPS: 100.0000 mg | ORAL_CAPSULE | Freq: Two times a day (BID) | ORAL | 0 refills | Status: AC

## 2022-07-10 NOTE — Progress Notes (Signed)
PTNS  Session # 4  Health & Social Factors: New dysuria with initiation x2 days, concerned for UTI. Caffeine: 1 Alcohol: 0 Daytime voids #per day: 5 Night-time voids #per night: 5 (worse due to UTI) Urgency: mild Incontinence Episodes #per day: 2 Ankle used: right Treatment Setting: 2 Feeling/ Response: both Comments: Patient tolerated well.   Performed By: Debroah Loop, PA-C   Additional notes: Dysuria as above x2 days. UA appears grossly infected. Will send for culture and start empiric Macrobid 100mg  BID x5 days. Will repeat UA next week to prove resolution of microheme.  Follow Up: 1 week for PTNS #5

## 2022-07-10 NOTE — Patient Instructions (Signed)
Tracking Your Bladder Symptoms   Patient Name:___________________________________________________  Example: Day   Daytime Voids  Nighttime Voids Urgency for the Day (none, mild, strong, severe) Number of Accidents/ Leaks Beverage Comments  Monday IIII II Strong I Water IIII Coffee  I     Week Starting:____________________________________  Day Daytime  Voids Nighttime  Voids Urgency for the Day (none, mild, strong, severe) Number of Accidents/ Leaks Beverages Comments                                                           This week my symptoms were:  O much better O better O the same O worse   

## 2022-07-12 LAB — CULTURE, URINE COMPREHENSIVE

## 2022-07-17 ENCOUNTER — Ambulatory Visit: Payer: Medicare Other | Admitting: Physician Assistant

## 2022-07-17 DIAGNOSIS — N3946 Mixed incontinence: Secondary | ICD-10-CM

## 2022-07-17 NOTE — Progress Notes (Signed)
PTNS  Session # 5  Health & Social Factors: Dysuria resolved on abx Caffeine: 1 Alcohol: 0 Daytime voids #per day: 5 Night-time voids #per night: 5 Urgency: mild Incontinence Episodes #per day: 2 Ankle used: right Treatment Setting: 7 Feeling/ Response: toe flex Comments: Patient tolerated well. No urine specimen left today, will reattempt next week.  Performed By: Debroah Loop, PA-C   Follow Up: 1 week for PTNS #6

## 2022-07-17 NOTE — Patient Instructions (Signed)
Tracking Your Bladder Symptoms   Patient Name:___________________________________________________  Example: Day   Daytime Voids  Nighttime Voids Urgency for the Day (none, mild, strong, severe) Number of Accidents/ Leaks Beverage Comments  Monday IIII II Strong I Water IIII Coffee  I     Week Starting:____________________________________  Day Daytime  Voids Nighttime  Voids Urgency for the Day (none, mild, strong, severe) Number of Accidents/ Leaks Beverages Comments                                                           This week my symptoms were:  O much better O better O the same O worse   

## 2022-07-19 ENCOUNTER — Other Ambulatory Visit: Payer: Medicare Other

## 2022-07-19 ENCOUNTER — Other Ambulatory Visit: Payer: Self-pay

## 2022-07-19 DIAGNOSIS — N3001 Acute cystitis with hematuria: Secondary | ICD-10-CM

## 2022-07-20 LAB — URINALYSIS, COMPLETE
Bilirubin, UA: NEGATIVE
Glucose, UA: NEGATIVE
Ketones, UA: NEGATIVE
Leukocytes,UA: NEGATIVE
Nitrite, UA: NEGATIVE
Protein,UA: NEGATIVE
RBC, UA: NEGATIVE
Specific Gravity, UA: 1.02 (ref 1.005–1.030)
Urobilinogen, Ur: 0.2 mg/dL (ref 0.2–1.0)
pH, UA: 5.5 (ref 5.0–7.5)

## 2022-07-20 LAB — MICROSCOPIC EXAMINATION: Epithelial Cells (non renal): >10 /hpf — AB (ref 0–10)

## 2022-07-24 ENCOUNTER — Ambulatory Visit: Payer: Medicare Other | Admitting: Physician Assistant

## 2022-07-24 ENCOUNTER — Encounter: Payer: Self-pay | Admitting: Physician Assistant

## 2022-07-24 DIAGNOSIS — N3946 Mixed incontinence: Secondary | ICD-10-CM

## 2022-07-24 NOTE — Patient Instructions (Signed)
Tracking Your Bladder Symptoms   Patient Name:___________________________________________________  Example: Day   Daytime Voids  Nighttime Voids Urgency for the Day (none, mild, strong, severe) Number of Accidents/ Leaks Beverage Comments  Monday IIII II Strong I Water IIII Coffee  I     Week Starting:____________________________________  Day Daytime  Voids Nighttime  Voids Urgency for the Day (none, mild, strong, severe) Number of Accidents/ Leaks Beverages Comments                                                           This week my symptoms were:  O much better O better O the same O worse   

## 2022-07-24 NOTE — Progress Notes (Signed)
PTNS  Session # 6  Health & Social Factors: this morning urgency in urination  changed Caffeine: 1 Alcohol: 0 Daytime voids #per day: 2-5 Night-time voids #per night: 2-3 Urgency: severe Incontinence Episodes #per day: none Ankle used: right Treatment Setting: 6 Feeling/ Response: toe flexed and feeling on bottom of right foot Comments: pt is tolerating well  Performed By: Lanice Schwab CMA  Follow Up: 1 week

## 2022-07-31 ENCOUNTER — Ambulatory Visit: Payer: Medicare Other | Admitting: Physician Assistant

## 2022-07-31 DIAGNOSIS — N3946 Mixed incontinence: Secondary | ICD-10-CM | POA: Diagnosis not present

## 2022-07-31 NOTE — Patient Instructions (Signed)
Tracking Your Bladder Symptoms   Patient Name:___________________________________________________  Example: Day   Daytime Voids  Nighttime Voids Urgency for the Day (none, mild, strong, severe) Number of Accidents/ Leaks Beverage Comments  Monday IIII II Strong I Water IIII Coffee  I     Week Starting:____________________________________  Day Daytime  Voids Nighttime  Voids Urgency for the Day (none, mild, strong, severe) Number of Accidents/ Leaks Beverages Comments                                                           This week my symptoms were:  O much better O better O the same O worse   

## 2022-07-31 NOTE — Progress Notes (Signed)
PTNS  Session # 7  Health & Social Factors: no change Caffeine: 1 Alcohol: 0 Daytime voids #per day: 5 Night-time voids #per night: 2 Urgency: strong Incontinence Episodes #per day: 2 + SUI episodes Ankle used: left Treatment Setting: 2 Feeling/ Response: sensory Comments: Patient tolerated well  Performed By: Carman Ching, PA-C   Follow Up: 1 week for PTNS #8

## 2022-08-07 ENCOUNTER — Other Ambulatory Visit: Payer: Self-pay | Admitting: Family Medicine

## 2022-08-07 ENCOUNTER — Ambulatory Visit (INDEPENDENT_AMBULATORY_CARE_PROVIDER_SITE_OTHER): Payer: Medicare Other | Admitting: Physician Assistant

## 2022-08-07 DIAGNOSIS — N3946 Mixed incontinence: Secondary | ICD-10-CM

## 2022-08-07 NOTE — Progress Notes (Signed)
PTNS  Session # 8  Health & Social Factors: no change Caffeine: 1-2 Alcohol: 0 Daytime voids #per day: 4-5 Night-time voids #per night: 3-4 Urgency: strong Incontinence Episodes #per day: overnight continuous leakage Ankle used: right Treatment Setting: 8 Feeling/ Response: sensory Comments: Patient tolerated well.  Performed By: Carman Ching, PA-C   Follow Up: 1 week for PTNS #9

## 2022-08-07 NOTE — Patient Instructions (Signed)
Tracking Your Bladder Symptoms   Patient Name:___________________________________________________  Example: Day   Daytime Voids  Nighttime Voids Urgency for the Day (none, mild, strong, severe) Number of Accidents/ Leaks Beverage Comments  Monday IIII II Strong I Water IIII Coffee  I     Week Starting:____________________________________  Day Daytime  Voids Nighttime  Voids Urgency for the Day (none, mild, strong, severe) Number of Accidents/ Leaks Beverages Comments                                                           This week my symptoms were:  O much better O better O the same O worse   

## 2022-08-11 IMAGING — DX DG CHEST 1V PORT
1 series · 1 of 1 positions shown · non-contrast
Comparison: 04/19/2021

CLINICAL DATA: Hypoxia, respiratory failure

EXAM:
PORTABLE CHEST 1 VIEW

[chest ap]
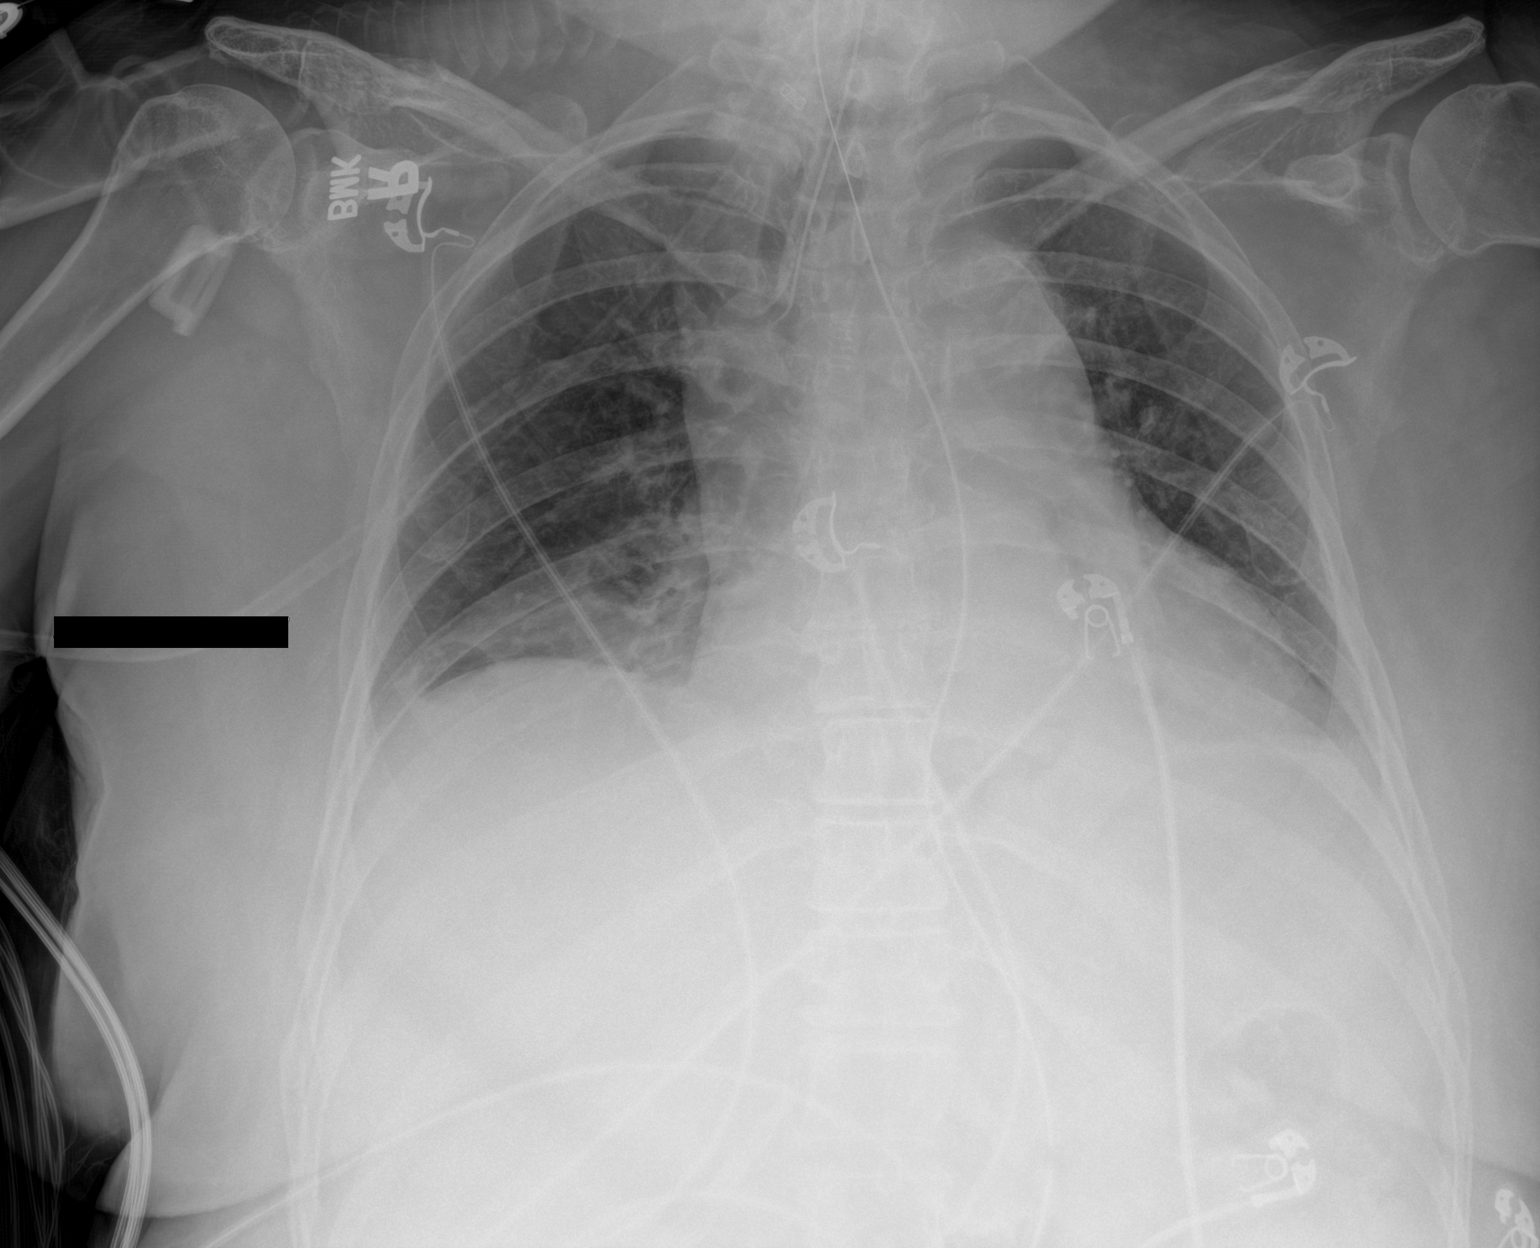

[1 of 1 positions shown; findings below may reference images not displayed]

FINDINGS: Single frontal view of the chest demonstrates stable endotracheal
tube and enteric catheter. Cardiac silhouette is enlarged. Continued
low lung volumes, with stable bibasilar consolidation left greater
than right. No effusion or pneumothorax.
IMPRESSION: 1. Stable support devices.
2. Low lung volumes, with stable bibasilar consolidation consistent
with atelectasis or airspace disease.

## 2022-08-14 ENCOUNTER — Ambulatory Visit: Payer: Medicare Other | Admitting: Physician Assistant

## 2022-08-14 DIAGNOSIS — N3001 Acute cystitis with hematuria: Secondary | ICD-10-CM | POA: Diagnosis not present

## 2022-08-14 DIAGNOSIS — N3946 Mixed incontinence: Secondary | ICD-10-CM | POA: Diagnosis not present

## 2022-08-14 DIAGNOSIS — R399 Unspecified symptoms and signs involving the genitourinary system: Secondary | ICD-10-CM | POA: Diagnosis not present

## 2022-08-14 LAB — URINALYSIS, COMPLETE
Bilirubin, UA: NEGATIVE
Glucose, UA: NEGATIVE
Ketones, UA: NEGATIVE
Nitrite, UA: NEGATIVE
Specific Gravity, UA: 1.015 (ref 1.005–1.030)
Urobilinogen, Ur: 0.2 mg/dL (ref 0.2–1.0)
pH, UA: 6 (ref 5.0–7.5)

## 2022-08-14 LAB — MICROSCOPIC EXAMINATION: WBC, UA: >30 /hpf — AB (ref 0–5)

## 2022-08-14 MED ORDER — SULFAMETHOXAZOLE-TRIMETHOPRIM 800-160 MG PO TABS: 1.0000 | ORAL_TABLET | Freq: Two times a day (BID) | ORAL | 0 refills | Status: DC

## 2022-08-14 NOTE — Patient Instructions (Signed)
Tracking Your Bladder Symptoms   Patient Name:___________________________________________________  Example: Day   Daytime Voids  Nighttime Voids Urgency for the Day (none, mild, strong, severe) Number of Accidents/ Leaks Beverage Comments  Monday IIII II Strong I Water IIII Coffee  I     Week Starting:____________________________________  Day Daytime  Voids Nighttime  Voids Urgency for the Day (none, mild, strong, severe) Number of Accidents/ Leaks Beverages Comments                                                           This week my symptoms were:  O much better O better O the same O worse   

## 2022-08-14 NOTE — Progress Notes (Signed)
PTNS  Session # 9  Health & Social Factors: Dysuria, itching, swelling x3 days, concern for UTI. Caffeine: 2 Alcohol: 0 Daytime voids #per day: 6 Night-time voids #per night: 4 Urgency: strong/severe Incontinence Episodes #per day: 5 Ankle used: right Treatment Setting: 7 Feeling/ Response: Sensory Comments: Patient tolerated well. Difficulty with left sided placement, will plan for right-sided treatments moving forward.  She reports a 3-day history of irritative voiding symptoms as above.  UA and microscopy appear grossly infected, will start empiric Bactrim and send for culture for further evaluation.  Will plan to discuss UTI prevention recommendations at her next visit, as this would represent her second culture positive UTI in 2 months.  Performed By: Carman Ching, PA-C   Follow Up: 1 week for PTNS #10

## 2022-08-15 ENCOUNTER — Emergency Department
Admission: EM | Admit: 2022-08-15 | Discharge: 2022-08-15 | Disposition: A | Discharge status: Home / Self Care | Payer: Medicare Other | Attending: Emergency Medicine | Admitting: Emergency Medicine

## 2022-08-15 ENCOUNTER — Telehealth: Payer: Self-pay | Admitting: Family Medicine

## 2022-08-15 ENCOUNTER — Encounter: Payer: Self-pay | Admitting: Family Medicine

## 2022-08-15 DIAGNOSIS — E039 Hypothyroidism, unspecified: Secondary | ICD-10-CM | POA: Diagnosis not present

## 2022-08-15 DIAGNOSIS — T782XXA Anaphylactic shock, unspecified, initial encounter: Secondary | ICD-10-CM | POA: Diagnosis not present

## 2022-08-15 DIAGNOSIS — T7840XA Allergy, unspecified, initial encounter: Secondary | ICD-10-CM | POA: Diagnosis present

## 2022-08-15 DIAGNOSIS — I10 Essential (primary) hypertension: Secondary | ICD-10-CM | POA: Insufficient documentation

## 2022-08-15 DIAGNOSIS — B9620 Unspecified Escherichia coli [E. coli] as the cause of diseases classified elsewhere: Secondary | ICD-10-CM

## 2022-08-15 DIAGNOSIS — T783XXA Angioneurotic edema, initial encounter: Secondary | ICD-10-CM | POA: Diagnosis not present

## 2022-08-15 LAB — BASIC METABOLIC PANEL
Anion gap: 10 (ref 5–15)
BUN: 15 mg/dL (ref 8–23)
CO2: 24 mmol/L (ref 22–32)
Calcium: 9.7 mg/dL (ref 8.9–10.3)
Chloride: 104 mmol/L (ref 98–111)
Creatinine, Ser: 0.96 mg/dL (ref 0.44–1.00)
GFR, Estimated: >60 mL/min (ref >60)
Glucose, Bld: 112 mg/dL — ABNORMAL HIGH (ref 70–99)
Potassium: 3.3 mmol/L — ABNORMAL LOW (ref 3.5–5.1)
Sodium: 138 mmol/L (ref 135–145)

## 2022-08-15 LAB — CBC
HCT: 40.3 % (ref 36.0–46.0)
Hemoglobin: 12.8 g/dL (ref 12.0–15.0)
MCH: 29.5 pg (ref 26.0–34.0)
MCHC: 31.8 g/dL (ref 30.0–36.0)
MCV: 92.9 fL (ref 80.0–100.0)
Platelets: 226 10*3/uL (ref 150–400)
RBC: 4.34 MIL/uL (ref 3.87–5.11)
RDW: 13.5 % (ref 11.5–15.5)
WBC: 9.5 10*3/uL (ref 4.0–10.5)
nRBC: 0 % (ref 0.0–0.2)

## 2022-08-15 LAB — TROPONIN I (HIGH SENSITIVITY)
Troponin I (High Sensitivity): 25 ng/L — ABNORMAL HIGH (ref <18)
Troponin I (High Sensitivity): 21 ng/L — ABNORMAL HIGH (ref <18)

## 2022-08-15 MED ORDER — EPINEPHRINE 0.3 MG/0.3ML IJ SOAJ
0.3000 mg | Freq: Once | INTRAMUSCULAR | Status: AC
  Administered 2022-08-15: 0.3 mg via INTRAMUSCULAR
  Filled 2022-08-15: qty 0.3

## 2022-08-15 MED ORDER — FAMOTIDINE 20 MG PO TABS: 20.0000 mg | ORAL_TABLET | Freq: Two times a day (BID) | ORAL | 0 refills | Status: DC

## 2022-08-15 MED ORDER — SODIUM CHLORIDE 0.9 % IV BOLUS
1000.0000 mL | Freq: Once | INTRAVENOUS | Status: AC
  Administered 2022-08-15: 1000 mL via INTRAVENOUS

## 2022-08-15 MED ORDER — FAMOTIDINE IN NACL 20-0.9 MG/50ML-% IV SOLN
20.0000 mg | Freq: Once | INTRAVENOUS | Status: AC
  Administered 2022-08-15: 20 mg via INTRAVENOUS
  Filled 2022-08-15: qty 50

## 2022-08-15 MED ORDER — PREDNISONE 20 MG PO TABS: ORAL_TABLET | ORAL | 0 refills | Status: DC

## 2022-08-15 MED ORDER — EPINEPHRINE 0.3 MG/0.3ML IJ SOAJ: 0.3000 mg | Freq: Once | INTRAMUSCULAR | 2 refills | Status: AC

## 2022-08-15 MED ORDER — METHYLPREDNISOLONE SODIUM SUCC 125 MG IJ SOLR
125.0000 mg | Freq: Once | INTRAMUSCULAR | Status: AC
  Administered 2022-08-15: 125 mg via INTRAVENOUS
  Filled 2022-08-15: qty 2

## 2022-08-15 MED ORDER — DIPHENHYDRAMINE HCL 50 MG/ML IJ SOLN
25.0000 mg | Freq: Once | INTRAMUSCULAR | Status: AC
  Administered 2022-08-15: 25 mg via INTRAVENOUS
  Filled 2022-08-15: qty 1

## 2022-08-15 NOTE — ED Provider Notes (Signed)
Reevaluated the patient, tongue is still mildly swelling but she reports it has improved slightly and has not gotten any worse.  She reports her husband is coming to pick her up at 10 and that is when she wants to leave,   Jene Every, MD 08/15/22 [phone]

## 2022-08-15 NOTE — ED Triage Notes (Signed)
Pt ambulatory to triage with steady gait without use of assistive devices  Pt c/o swelling to mouth, onset apx 90 mins ago. Pt states has multiple allergies and is unsure if she may have had contact with those.  Also states she started taking Bactrim yesterday for UTI sx. States having difficulty speaking due to edema of tongue.

## 2022-08-15 NOTE — ED Provider Notes (Signed)
[facility] Main Provider Note    Event Date/Time   First MD Initiated Contact with Patient 08/15/22 [phone]     (approximate)   History   Allergic Reaction   HPI  Sharon [name] is a 72 y.o. female who presents to the ED from home with a chief complaint of tongue swelling.  Patient with a history of anaphylaxis, states she has multiple allergies.  Started on Bactrim yesterday for UTI.  Has never taken it before.  Took her first dose at 2 PM, then her second dose prior to bedtime.  She was still awake and noted swelling to the right side of her tongue around 3:30 AM.  Took 50 mg oral Benadryl and Allegra prior to arrival.  Denies chest pain, shortness of breath, abdominal pain, nausea, vomiting or diarrhea.     Past Medical History   Past Medical History:  Diagnosis Date   Angio-edema    Anxiety    Cataract    GERD (gastroesophageal reflux disease)    Hyperlipidemia    Hypertension    Hypothyroidism    Vitamin D deficiency      Active Problem List   Patient Active Problem List   Diagnosis Date Noted   Burning with urination 06/11/2022   Angioedema due to angiotensin converting enzyme inhibitor (ACE-I) 02/27/2022   SVT (supraventricular tachycardia) 02/22/2022   Urinary incontinence 12/09/2021   Pedal edema 07/26/2021   Diarrhea 04/27/2021   Angioedema 04/17/2021   Dizziness 08/23/2020   Fatigue 08/23/2020   Hyperglycemia 12/17/2019   Medicare annual wellness visit, subsequent 08/28/2018   Cough 01/28/2018   Welcome to Medicare preventive visit 08/22/2016   Obesity 10/05/2014   Anemia 09/24/2013   Palpitations 03/03/2013   Colon cancer screening 03/03/2013   Hypokalemia 10/09/2011   Routine general medical examination at a health care facility 10/02/2011   Vitamin D deficiency disease 12/28/2010   ARTHRALGIA 08/19/2007   Hypothyroidism 01/31/2007   Hyperlipidemia 01/31/2007   Anxiety state 01/31/2007   Essential hypertension 01/31/2007    GERD 01/31/2007     Past Surgical History   Past Surgical History:  Procedure Laterality Date   CATARACT EXTRACTION W/ INTRAOCULAR LENS IMPLANT Right 12/29/2016   Dr. Mia Creek   CATARACT EXTRACTION W/ INTRAOCULAR LENS IMPLANT Left 01/15/2017   Dr. Mia Creek   COLONOSCOPY     LEEP     TUBAL LIGATION     VEIN LIGATION AND STRIPPING     x2     Home Medications   Prior to Admission medications   Medication Sig Start Date End Date Taking? Authorizing Provider  EPINEPHrine 0.3 mg/0.3 mL IJ SOAJ injection Inject 0.3 mg into the muscle once for 1 dose. 08/15/22 08/15/22 Yes Irean Hong, MD  famotidine (PEPCID) 20 MG tablet Take 1 tablet (20 mg total) by mouth 2 (two) times daily. 08/15/22  Yes Irean Hong, MD  predniSONE (DELTASONE) 20 MG tablet 3 tablets daily x 4 days 08/15/22  Yes Irean Hong, MD  albuterol (VENTOLIN HFA) 108 (90 Base) MCG/ACT inhaler TAKE 2 PUFFS BY MOUTH EVERY 6 HOURS AS NEEDED FOR WHEEZE OR SHORTNESS OF BREATH 06/13/22   Tower, Audrie Gallus, MD  aspirin 81 MG tablet Take 81 mg by mouth daily.    [provider]  benzonatate (TESSALON) 200 MG capsule Take 1 capsule (200 mg total) by mouth 3 (three) times daily as needed for cough. Swallow whole 06/23/22   Tower, Audrie Gallus, MD  calcium  carbonate (TUMS EX) 750 MG chewable tablet Chew 2 tablets by mouth at bedtime.    [provider]  Cholecalciferol (VITAMIN D) 1000 UNITS capsule Take 1,000 Units by mouth daily.    [provider]  diphenhydrAMINE (BENADRYL ALLERGY) 25 MG tablet Take 1 tablet (25 mg total) by mouth every 6 (six) hours as needed. 02/15/22   Willeen Niece, MD  EPINEPHrine 0.3 mg/0.3 mL IJ SOAJ injection Inject 0.3 mg into the muscle as needed for anaphylaxis. 04/27/21   Tower, Audrie Gallus, MD  ezetimibe (ZETIA) 10 MG tablet Take 1 tablet (10 mg) by mouth once daily 05/08/22   Antonieta Iba, MD  fexofenadine (ALLEGRA) 180 MG tablet Take 180 mg by mouth daily. 05/11/22   [provider]  fluticasone (FLONASE) 50 MCG/ACT nasal spray USE 2 SPRAYS IN BOTH NOSTRILS  DAILY 05/31/22   Tower, Audrie Gallus, MD  folic acid (FOLVITE) 1 MG tablet Take 1 tablet (1 mg total) by mouth daily. 02/16/22   Willeen Niece, MD  hydrochlorothiazide (HYDRODIURIL) 25 MG tablet TAKE 1 TABLET (25 MG TOTAL) BY MOUTH DAILY. 06/13/22   Tower, Audrie Gallus, MD  levocetirizine (XYZAL) 5 MG tablet Take 5 mg by mouth daily. 05/10/22   [provider]  levothyroxine (SYNTHROID) 50 MCG tablet TAKE 1 TABLET BY MOUTH DAILY  BEFORE BREAKFAST 11/09/21   Tower, Audrie Gallus, MD  metoprolol tartrate (LOPRESSOR) 25 MG tablet Take 1 tablet (25 mg total) by mouth 2 (two) times daily. 01/31/22   Furth, Cadence H, PA-C  oxybutynin (DITROPAN-XL) 10 MG 24 hr tablet Take one tablet am and pm 04/04/22   Michiel Cowboy A, PA-C  pantoprazole (PROTONIX) 40 MG tablet TAKE 1 TABLET BY MOUTH DAILY 11/09/21   Tower, Audrie Gallus, MD  rosuvastatin (CRESTOR) 20 MG tablet Take 1 tablet (20 mg total) by mouth daily. 01/19/22   Tower, Audrie Gallus, MD  sertraline (ZOLOFT) 100 MG tablet TAKE 1 TABLET BY MOUTH DAILY 11/09/21   Tower, Audrie Gallus, MD  sulfamethoxazole-trimethoprim (BACTRIM DS) 800-160 MG tablet Take 1 tablet by mouth 2 (two) times daily for 5 days. 08/14/22 08/19/22  Carman Ching, PA-C  thiamine (VITAMIN B-1) 100 MG tablet Take 1 tablet (100 mg total) by mouth daily. 02/16/22   Willeen Niece, MD     Allergies  Cozaar [losartan], Zinc gelatin [zinc], Alpha-gal, Amlodipine besylate, Atorvastatin, Beef allergy, Cat hair extract, Dust mite extract, and Neomycin-bacitracin zn-polymyx   Family History   Family History  Problem Relation Age of Onset   Transient ischemic attack Mother    Hypertension Mother    Hypertension Father    Colon cancer Neg Hx    Esophageal cancer Neg Hx    Rectal cancer Neg Hx    Stomach cancer Neg Hx      Physical Exam  Triage Vital Signs: ED Triage Vitals  Enc Vitals Group     BP  08/15/22 0512 (!) 169/95     Pulse Rate 08/15/22 0512 73     Resp 08/15/22 0512 19     Temp 08/15/22 0512 97.9 F (36.6 C)     Temp Source 08/15/22 0512 Oral     SpO2 08/15/22 0512 97 %     Weight 08/15/22 0510 202 lb (91.6 kg)     Height 08/15/22 0510 5\' 6"  (1.676 m)     Head Circumference --      Peak Flow --      Pain Score 08/15/22 0510 0  Pain Loc --      Pain Edu? --      Excl. in GC? --     Updated Vital Signs: BP (!) 169/95   Pulse 73   Temp 97.9 F (36.6 C) (Oral)   Resp 19   Ht 5\' 6"  (1.676 m)   Wt 91.6 kg   SpO2 97%   BMI 32.60 kg/m    General: Awake, mild distress.  CV:  RRR.  Good peripheral perfusion.  Resp:  Normal effort.  CTAB. Abd:  Nontender.  No distention.  Other:  Right-sided tongue swelling with ensuing muffled voice.  Mild swelling to submandibular space.  Posterior oropharynx is clear.  There is no drooling.  Tolerating secretions well.   ED Results / Procedures / Treatments  Labs (all labs ordered are listed, but only abnormal results are displayed) Labs Reviewed  BASIC METABOLIC PANEL - Abnormal; Notable for the following components:      Result Value   Potassium 3.3 (*)    Glucose, Bld 112 (*)    All other components within normal limits  TROPONIN I (HIGH SENSITIVITY) - Abnormal; Notable for the following components:   Troponin I (High Sensitivity) 25 (*)    All other components within normal limits  CBC     EKG  ED ECG REPORT I, Anihya Tuma J, the attending physician, personally viewed and interpreted this ECG.   Date: 08/15/2022  EKG Time: 0642  Rate: 84  Rhythm: Sinus rhythm with arrhythmia; P waves noted  Axis: Normal  Intervals: QTc 535  ST&T Change: Nonspecific    RADIOLOGY None   Official radiology report(s): No results found.   PROCEDURES:  Critical Care performed: Yes, see critical care procedure note(s)  CRITICAL CARE Performed by: Irean Hong   Total critical care time: 30 minutes  Critical  care time was exclusive of separately billable procedures and treating other patients.  Critical care was necessary to treat or prevent imminent or life-threatening deterioration.  Critical care was time spent personally by me on the following activities: development of treatment plan with patient and/or surrogate as well as nursing, discussions with consultants, evaluation of patient's response to treatment, examination of patient, obtaining history from patient or surrogate, ordering and performing treatments and interventions, ordering and review of laboratory studies, ordering and review of radiographic studies, pulse oximetry and re-evaluation of patient's condition.   Marland Kitchen1-3 Lead EKG Interpretation  Performed by: Irean Hong, MD Authorized by: Irean Hong, MD     Interpretation: normal     ECG rate:  75   ECG rate assessment: normal     Rhythm: sinus rhythm     Ectopy: none     Conduction: normal   Comments:     Patient placed on cardiac monitor to evaluate for arrhythmias    MEDICATIONS ORDERED IN ED: Medications  famotidine (PEPCID) IVPB 20 mg premix (20 mg Intravenous New Bag/Given 08/15/22 0637)  EPINEPHrine (EPI-PEN) injection 0.3 mg (0.3 mg Intramuscular Given 08/15/22 4098)  methylPREDNISolone sodium succinate (SOLU-MEDROL) 125 mg/2 mL injection 125 mg (125 mg Intravenous Given 08/15/22 0627)  diphenhydrAMINE (BENADRYL) injection 25 mg (25 mg Intravenous Given 08/15/22 0637)  sodium chloride 0.9 % bolus 1,000 mL (1,000 mLs Intravenous New Bag/Given 08/15/22 0637)     IMPRESSION / MDM / ASSESSMENT AND PLAN / ED COURSE  I reviewed the triage vital signs and the nursing notes.  72 year old female presenting with angioedema.  Differential diagnosis includes but is not limited to anaphylaxis caused by Bactrim antibiotic, environmental exposures, food allergy, etc.  I have personally reviewed patient's records and note her urology office visit from  yesterday.  Patient's presentation is most consistent with acute presentation with potential threat to life or bodily function.  The patient is on the cardiac monitor to evaluate for evidence of arrhythmia and/or significant heart rate changes.  Obtain basic lab work, EKG.  Initiate IV anaphylaxis cocktail to include fluids, Benadryl, Solu-Medrol and Pepcid.  Administer IM Epinephrine.  Anticipate 4-hour observation.  Clinical Course as of 08/15/22 0653  Tue Aug 15, 2022  0652 No visible improvement or worsening on reexamination although patient states she is feeling a lot better.  Will continue to monitor and care for patient.  Care transferred to oncoming provider at change of shift pending reassessments and disposition. [JS]    Clinical Course User Index [JS] Irean Hong, MD     FINAL CLINICAL IMPRESSION(S) / ED DIAGNOSES   Final diagnoses:  Angioedema, initial encounter  Anaphylaxis, initial encounter     Rx / DC Orders   ED Discharge Orders          Ordered    predniSONE (DELTASONE) 20 MG tablet        08/15/22 0613    famotidine (PEPCID) 20 MG tablet  2 times daily        08/15/22 1610    EPINEPHrine 0.3 mg/0.3 mL IJ SOAJ injection   Once        08/15/22 9604             Note:  This document was prepared using Dragon voice recognition software and may include unintentional dictation errors.   Irean Hong, MD 08/15/22 [phone]

## 2022-08-15 NOTE — Telephone Encounter (Signed)
Patient called and states she was given Bactrim yesterday for UTI and had to go to ER this morning for Anaphylaxis reaction. Is there another ABX you want to prescribe?

## 2022-08-15 NOTE — Discharge Instructions (Signed)
1. Take the following medicines for the next 4 days: Prednisone  daily Pepcid  twice daily 2. Take Benadryl as needed for itching. 3. Use Epi-Pen in case of acute, life-threatening allergic reaction. 4. Discontinue antibiotic. Call your urology to switch your antibiotic, preferably something you have taken previously without adverse reaction. 5. Return to the ER for worsening symptoms, persistent vomiting, difficulty breathing or other concerns.

## 2022-08-16 ENCOUNTER — Encounter: Payer: Self-pay | Admitting: Family Medicine

## 2022-08-16 NOTE — Telephone Encounter (Signed)
Patient returned call,would like a call back 

## 2022-08-16 NOTE — Telephone Encounter (Signed)
Left VM requesting pt to call the office back and also sent mychart message

## 2022-08-16 NOTE — Telephone Encounter (Signed)
Left VM requesting pt to call the office back 

## 2022-08-16 NOTE — Telephone Encounter (Signed)
Would you check in with her?  How are her symptoms? (Allergic reaction, tongue swelling)   Why could [facility] not fill it? Thanks

## 2022-08-17 LAB — CULTURE, URINE COMPREHENSIVE

## 2022-08-17 NOTE — Telephone Encounter (Signed)
I'm so sorry to hear about her allergic reaction. We have added Bactrim to her allergy list. Let's try Macrobid  BID x5 days instead.

## 2022-08-17 NOTE — Telephone Encounter (Signed)
.  left message to have patient return my call.  

## 2022-08-18 ENCOUNTER — Telehealth: Payer: Self-pay

## 2022-08-18 MED ORDER — NITROFURANTOIN MONOHYD MACRO 100 MG PO CAPS
100.0000 mg | ORAL_CAPSULE | Freq: Two times a day (BID) | ORAL | 0 refills | Status: DC
Start: 2022-08-18 — End: 2022-12-14

## 2022-08-18 NOTE — Addendum Note (Signed)
Addended by: Debbe Bales C on: 08/18/2022 12:00 PM   Modules accepted: Orders

## 2022-08-18 NOTE — Telephone Encounter (Signed)
        Patient  visited [facility] on 08/15/2022  for allergic reaction.   Telephone encounter attempt :  1st  A HIPAA compliant voice message was left requesting a return call.  Instructed patient to call back at [phone].   Shareta Fishbaugh [facility]  [facility] Population Health Community Resource Care Guide   [email or web address]  ?? [phone]   Website: [email or web address]

## 2022-08-18 NOTE — Telephone Encounter (Signed)
Pt LM on triage line stating that she has missed several calls.   Called pt back no answer. Left detailed message per DPR informing her of medication change due to reaction. RX sent in.

## 2022-08-21 ENCOUNTER — Ambulatory Visit: Payer: Medicare Other | Admitting: Physician Assistant

## 2022-08-21 DIAGNOSIS — N3946 Mixed incontinence: Secondary | ICD-10-CM

## 2022-08-21 NOTE — Patient Instructions (Signed)
Recurrent UTI Prevention Strategies  Stay well hydrated. Start taking an over-the-counter cranberry supplement for urinary tract health. Take this once or twice daily on an empty stomach, e.g. right before bed. Start taking an over-the-counter probiotic containing the bacterial species called Lactobacillus. Take this daily. Start vaginal estrogen cream. Apply a pea-sized amount around the opening of the urethra every day for 2 weeks, then three times weekly forever.    Tracking Your Bladder Symptoms   Patient Name:___________________________________________________  Example: Day   Daytime Voids  Nighttime Voids Urgency for the Day (none, mild, strong, severe) Number of Accidents/ Leaks Beverage Comments  Monday IIII II Strong I Water IIII Coffee  I     Week Starting:____________________________________  Day Daytime  Voids Nighttime  Voids Urgency for the Day (none, mild, strong, severe) Number of Accidents/ Leaks Beverages Comments                                                           This week my symptoms were:  O much better O better O the same O worse

## 2022-08-21 NOTE — Progress Notes (Signed)
PTNS  Session # 10  Health & Social Factors: Anaphylaxis to Bactrim last week; dysuria has resolved on culture appropriate Macrobid. Caffeine: 2 Alcohol: 0 Daytime voids #per day: 5 Night-time voids #per night: 3-4 Urgency: strong Incontinence Episodes #per day: 1-2 Ankle used: right Treatment Setting: 2 Feeling/ Response: Sensory Comments: Patient tolerated well.  Performed By: Carman Ching, PA-C   Additional notes: rUTI prevention strategies added to AVS today  Follow Up: 1 week for PTNS #11

## 2022-08-22 ENCOUNTER — Telehealth: Payer: Self-pay

## 2022-08-22 NOTE — Telephone Encounter (Signed)
Transition Care Management Unsuccessful Follow-up Telephone Call  Date of discharge and from where:  08/15/2022 [facility].  Attempts:  2nd Attempt  Reason for unsuccessful TCM follow-up call:  Left voice message  Sharon [name] [facility]  [facility] Population Health Community Resource Care Guide   [email or web address]  ?? [phone]   Website: [email or web address]      

## 2022-08-26 ENCOUNTER — Other Ambulatory Visit: Payer: Self-pay | Admitting: Family Medicine

## 2022-08-28 ENCOUNTER — Other Ambulatory Visit: Payer: Self-pay | Admitting: Cardiovascular Disease

## 2022-08-28 ENCOUNTER — Ambulatory Visit: Payer: Medicare Other | Admitting: Physician Assistant

## 2022-08-28 DIAGNOSIS — N3946 Mixed incontinence: Secondary | ICD-10-CM | POA: Diagnosis not present

## 2022-08-28 NOTE — Progress Notes (Signed)
         PTNS   Session # 11   Health & Social Factors: None Caffeine: 2 Alcohol: 0 Daytime voids #per day: 5 Night-time voids #perNS #1  night: 3-4 Urgency: strong Incontinence Episodes #per day: 1-2 Ankle used: right Treatment Setting: 2 Feeling/ Response: Sensory Comments: Patient tolerated well.   Performed By: Mammie Lorenzo, RN    Follow Up: 1 week for PT

## 2022-08-28 NOTE — Patient Instructions (Signed)
Tracking Your Bladder Symptoms   Patient Name:___________________________________________________  Example: Day   Daytime Voids  Nighttime Voids Urgency for the Day (none, mild, strong, severe) Number of Accidents/ Leaks Beverage Comments  Monday IIII II Strong I Water IIII Coffee  I     Week Starting:____________________________________  Day Daytime  Voids Nighttime  Voids Urgency for the Day (none, mild, strong, severe) Number of Accidents/ Leaks Beverages Comments                                                           This week my symptoms were:  O much better O better O the same O worse   

## 2022-09-04 ENCOUNTER — Ambulatory Visit: Payer: Medicare Other | Admitting: Physician Assistant

## 2022-09-06 ENCOUNTER — Ambulatory Visit: Payer: Medicare Other | Admitting: Physician Assistant

## 2022-09-06 DIAGNOSIS — N3946 Mixed incontinence: Secondary | ICD-10-CM

## 2022-09-06 NOTE — Progress Notes (Addendum)
PTNS   Session # 12   Health & Social Factors: None Caffeine: 2 Alcohol: 0 Daytime voids #per day: 5 Night-time voids 5 Urgency: severe Incontinence Episodes #per day:3 Ankle used: right Treatment Setting: 12 Feeling/ Response: Sensory Comments: Patient tolerated well.   Performed By: Mammie Lorenzo, RN    Follow Up: 1 month

## 2022-10-04 ENCOUNTER — Ambulatory Visit: Payer: Medicare Other | Admitting: Physician Assistant

## 2022-10-17 ENCOUNTER — Ambulatory Visit: Payer: Medicare Other | Admitting: Physician Assistant

## 2022-10-17 ENCOUNTER — Encounter: Payer: Self-pay | Admitting: Physician Assistant

## 2022-10-17 VITALS — BP 151/83 | HR 67 | Ht 66.0 in | Wt 214.1 lb

## 2022-10-17 DIAGNOSIS — N3946 Mixed incontinence: Secondary | ICD-10-CM | POA: Diagnosis not present

## 2022-10-17 NOTE — Progress Notes (Signed)
10/17/2022 10:56 AM   Sharon [name] [birth year] [id number]  CC: Chief Complaint  Patient presents with   Over Active Bladder   HPI: Sharon [name] is a 72 y.o. female with PMH OAB wet with mixed urge and stress incontinence who completed PTNS x12 last month who presents today for follow up.   Today she reports slight, though inconsistent, improvement in her urinary symptoms following PTNS.  She has noticed increased urinary control and decreased frequency of leaks, but admits that some days her improvement is more marked than others.  Per chart review, PTNS symptom diary progress as below: Daytime voids: 5->5 Nighttime voids: 2-3->5 Urgency: Severe -> severe Incontinence episodes: 4-5 ->3  PMH: Past Medical History:  Diagnosis Date   Angio-edema    Anxiety    Cataract    GERD (gastroesophageal reflux disease)    Hyperlipidemia    Hypertension    Hypothyroidism    Vitamin D deficiency     Surgical History: Past Surgical History:  Procedure Laterality Date   CATARACT EXTRACTION W/ INTRAOCULAR LENS IMPLANT Right 12/29/2016   Dr. Mia Creek   CATARACT EXTRACTION W/ INTRAOCULAR LENS IMPLANT Left 01/15/2017   Dr. Mia Creek   COLONOSCOPY     LEEP     TUBAL LIGATION     VEIN LIGATION AND STRIPPING     x2    Home Medications:  Allergies as of 10/17/2022       Reactions   Cozaar [losartan] Swelling   Septra [sulfamethoxazole-trimethoprim] Anaphylaxis   Zinc Gelatin [zinc] Anaphylaxis   Alpha-gal    Amlodipine Besylate    REACTION: edema   Atorvastatin    REACTION: muscle pain   Beef Allergy    Cat Hair Extract    Dust Mite Extract    Neomycin-bacitracin Zn-polymyx    REACTION: rash Per pt not sure if from meds or not        Medication List        Accurate as of October 17, 2022 10:56 AM. If you have any questions, ask your nurse or doctor.          albuterol 108 (90 Base) MCG/ACT inhaler Commonly known as: VENTOLIN HFA TAKE 2 PUFFS  BY MOUTH EVERY 6 HOURS AS NEEDED FOR WHEEZE OR SHORTNESS OF BREATH   aspirin 81 MG tablet Take 81 mg by mouth daily.   benzonatate 200 MG capsule Commonly known as: TESSALON Take 1 capsule (200 mg total) by mouth 3 (three) times daily as needed for cough. Swallow whole   calcium carbonate 750 MG chewable tablet Commonly known as: TUMS EX Chew 2 tablets by mouth at bedtime.   diphenhydrAMINE 25 MG tablet Commonly known as: Benadryl Allergy Take 1 tablet (25 mg total) by mouth every 6 (six) hours as needed.   EPINEPHrine 0.3 mg/0.3 mL Soaj injection Commonly known as: EPI-PEN Inject 0.3 mg into the muscle as needed for anaphylaxis.   ezetimibe 10 MG tablet Commonly known as: ZETIA TAKE 1 TABLET BY MOUTH EVERY DAY   famotidine 20 MG tablet Commonly known as: Pepcid Take 1 tablet (20 mg total) by mouth 2 (two) times daily.   fexofenadine 180 MG tablet Commonly known as: ALLEGRA Take 180 mg by mouth daily.   fluticasone 50 MCG/ACT nasal spray Commonly known as: FLONASE USE 2 SPRAYS IN BOTH NOSTRILS  DAILY   folic acid 1 MG tablet Commonly known as: FOLVITE Take 1 tablet (1 mg total) by mouth daily.   hydrochlorothiazide 25  MG tablet Commonly known as: HYDRODIURIL TAKE 1 TABLET (25 MG TOTAL) BY MOUTH DAILY.   levocetirizine 5 MG tablet Commonly known as: XYZAL Take 5 mg by mouth daily.   levothyroxine 50 MCG tablet Commonly known as: SYNTHROID TAKE 1 TABLET BY MOUTH DAILY  BEFORE BREAKFAST   metoprolol tartrate 25 MG tablet Commonly known as: LOPRESSOR Take 1 tablet (25 mg total) by mouth 2 (two) times daily.   nitrofurantoin (macrocrystal-monohydrate) 100 MG capsule Commonly known as: MACROBID Take 1 capsule (100 mg total) by mouth 2 (two) times daily.   oxybutynin 10 MG 24 hr tablet Commonly known as: DITROPAN-XL Take one tablet am and pm   pantoprazole 40 MG tablet Commonly known as: PROTONIX TAKE 1 TABLET BY MOUTH DAILY   predniSONE 20 MG  tablet Commonly known as: DELTASONE 3 tablets daily x 4 days   rosuvastatin 20 MG tablet Commonly known as: Crestor Take 1 tablet (20 mg total) by mouth daily.   sertraline 100 MG tablet Commonly known as: ZOLOFT TAKE 1 TABLET BY MOUTH DAILY   thiamine 100 MG tablet Commonly known as: Vitamin B-1 Take 1 tablet (100 mg total) by mouth daily.   Vitamin D 1000 units capsule Take 1,000 Units by mouth daily.        Allergies:  Allergies  Allergen Reactions   Cozaar [Losartan] Swelling   Septra [Sulfamethoxazole-Trimethoprim] Anaphylaxis   Zinc Gelatin [Zinc] Anaphylaxis   Alpha-Gal    Amlodipine Besylate     REACTION: edema   Atorvastatin     REACTION: muscle pain   Beef Allergy    Cat Hair Extract    Dust Mite Extract    Neomycin-Bacitracin Zn-Polymyx     REACTION: rash Per pt not sure if from meds or not    Family History: Family History  Problem Relation Age of Onset   Transient ischemic attack Mother    Hypertension Mother    Hypertension Father    Colon cancer Neg Hx    Esophageal cancer Neg Hx    Rectal cancer Neg Hx    Stomach cancer Neg Hx     Social History:   reports that she quit smoking about 26 years ago. Her smoking use included cigarettes. She has never used smokeless tobacco. She reports current alcohol use of about 4.0 standard drinks of alcohol per week. She reports that she does not use drugs.  Physical Exam: BP (!) 151/83   Pulse 67   Ht 5\' 6"  (1.676 m)   Wt 214 lb 2 oz (97.1 kg)   BMI 34.56 kg/m   Constitutional:  Alert and oriented, no acute distress, nontoxic appearing HEENT: Peak, AT Cardiovascular: No clubbing, cyanosis, or edema Respiratory: Normal respiratory effort, no increased work of breathing Skin: No rashes, bruises or suspicious lesions Neurologic: Grossly intact, no focal deficits, moving all 4 extremities Psychiatric: Normal mood and affect  Assessment & Plan:   1. Mixed stress and urge urinary incontinence Slight  symptom improvement after PTNS x12, desires to pursue maintenance PTNS for now. First maintenance treatment completed today; see separate procedure note.   We discussed consideration of alternative third line therapies including intravesical Botox and InterStim. She requested pamphlets, which were provided. Can discontinue maintenance PTNS if she elects to pursue either of these. Will continue for now. - PTNS-Percutaneous Tibial Nerve Stimulati  Return in about 4 weeks (around 11/14/2022) for PTNS maintenance.  Carman Ching, PA-C  [facility] [place] [address] [place], [place]  [address] 

## 2022-10-17 NOTE — Patient Instructions (Signed)
Tracking Your Bladder Symptoms   Patient Name:___________________________________________________  Example: Day   Daytime Voids  Nighttime Voids Urgency for the Day (none, mild, strong, severe) Number of Accidents/ Leaks Beverage Comments  Monday IIII II Strong I Water IIII Coffee  I     Week Starting:____________________________________  Day Daytime  Voids Nighttime  Voids Urgency for the Day (none, mild, strong, severe) Number of Accidents/ Leaks Beverages Comments                                                           This week my symptoms were:  O much better O better O the same O worse   

## 2022-10-17 NOTE — Progress Notes (Signed)
PTNS  Session # 13  Health & Social Factors: no change Caffeine: 1 Alcohol: 0 Daytime voids #per day: 5 Night-time voids #per night: 3 Urgency: strong Incontinence Episodes #per day: strong Ankle used: right Treatment Setting: 12 Feeling/ Response: sensory Comments: Patient tolerated well.  Performed By: Carman Ching, PA-C   Follow Up: 1 month maintenance PTNS

## 2022-10-31 ENCOUNTER — Other Ambulatory Visit: Payer: Self-pay | Admitting: Family Medicine

## 2022-11-14 ENCOUNTER — Ambulatory Visit: Payer: Medicare Other | Admitting: Physician Assistant

## 2022-11-14 DIAGNOSIS — N3946 Mixed incontinence: Secondary | ICD-10-CM | POA: Diagnosis not present

## 2022-11-14 DIAGNOSIS — B3731 Acute candidiasis of vulva and vagina: Secondary | ICD-10-CM

## 2022-11-14 MED ORDER — CLOTRIMAZOLE 2 % VA CREA
1.0000 | TOPICAL_CREAM | Freq: Every day | VAGINAL | Status: DC
Start: 2022-11-14 — End: 2022-11-15

## 2022-11-14 NOTE — Progress Notes (Signed)
11/14/2022 10:57 AM   [name] 19-Nov-1950 [id number]  CC: Chief Complaint  Patient presents with   PTNS   HPI: Sharon [name] is a 72 y.o. female with PMH OAB wet with mixed urge and stress incontinence on PTNS maintenance who presents today for maintenance PTNS.   Today she reports she is unsure if PTNS has helped her symptoms. Some days she thinks she notices a difference, but sometimes not. Her reported weekly symptoms remained rather stable throughout her first 12 treatments. She has not yet read through the Botox or InterStim pamphlets I gave her last month.  She also reports vulvovaginal irritation and itching since completing antibiotics for UTI with me recently. She has been using Benadryl anti-itch cream for this.  PMH: Past Medical History:  Diagnosis Date   Angio-edema    Anxiety    Cataract    GERD (gastroesophageal reflux disease)    Hyperlipidemia    Hypertension    Hypothyroidism    Vitamin D deficiency     Surgical History: Past Surgical History:  Procedure Laterality Date   CATARACT EXTRACTION W/ INTRAOCULAR LENS IMPLANT Right 12/29/2016   Dr. Mia Creek   CATARACT EXTRACTION W/ INTRAOCULAR LENS IMPLANT Left 01/15/2017   Dr. Mia Creek   COLONOSCOPY     LEEP     TUBAL LIGATION     VEIN LIGATION AND STRIPPING     x2    Home Medications:  Allergies as of 11/14/2022       Reactions   Cozaar [losartan] Swelling   Septra [sulfamethoxazole-trimethoprim] Anaphylaxis   Zinc Gelatin [zinc] Anaphylaxis   Alpha-gal    Amlodipine Besylate    REACTION: edema   Atorvastatin    REACTION: muscle pain   Beef Allergy    Cat Hair Extract    Dust Mite Extract    Neomycin-bacitracin Zn-polymyx    REACTION: rash Per pt not sure if from meds or not        Medication List        Accurate as of November 14, 2022 10:57 AM. If you have any questions, ask your nurse or doctor.          albuterol 108 (90 Base) MCG/ACT inhaler Commonly  known as: VENTOLIN HFA TAKE 2 PUFFS BY MOUTH EVERY 6 HOURS AS NEEDED FOR WHEEZE OR SHORTNESS OF BREATH   aspirin 81 MG tablet Take 81 mg by mouth daily.   benzonatate 200 MG capsule Commonly known as: TESSALON Take 1 capsule (200 mg total) by mouth 3 (three) times daily as needed for cough. Swallow whole   calcium carbonate 750 MG chewable tablet Commonly known as: TUMS EX Chew 2 tablets by mouth at bedtime.   diphenhydrAMINE 25 MG tablet Commonly known as: Benadryl Allergy Take 1 tablet (25 mg total) by mouth every 6 (six) hours as needed.   EPINEPHrine 0.3 mg/0.3 mL Soaj injection Commonly known as: EPI-PEN Inject 0.3 mg into the muscle as needed for anaphylaxis.   ezetimibe 10 MG tablet Commonly known as: ZETIA TAKE 1 TABLET BY MOUTH EVERY DAY   famotidine 20 MG tablet Commonly known as: Pepcid Take 1 tablet (20 mg total) by mouth 2 (two) times daily.   fexofenadine 180 MG tablet Commonly known as: ALLEGRA Take 180 mg by mouth daily.   fluticasone 50 MCG/ACT nasal spray Commonly known as: FLONASE USE 2 SPRAYS IN BOTH NOSTRILS  DAILY   folic acid 1 MG tablet Commonly known as: FOLVITE Take 1 tablet (  1 mg total) by mouth daily.   hydrochlorothiazide 25 MG tablet Commonly known as: HYDRODIURIL TAKE 1 TABLET (25 MG TOTAL) BY MOUTH DAILY.   levocetirizine 5 MG tablet Commonly known as: XYZAL Take 5 mg by mouth daily.   levothyroxine 50 MCG tablet Commonly known as: SYNTHROID TAKE 1 TABLET BY MOUTH DAILY  BEFORE BREAKFAST   metoprolol tartrate 25 MG tablet Commonly known as: LOPRESSOR Take 1 tablet (25 mg total) by mouth 2 (two) times daily.   nitrofurantoin (macrocrystal-monohydrate) 100 MG capsule Commonly known as: MACROBID Take 1 capsule (100 mg total) by mouth 2 (two) times daily.   oxybutynin 10 MG 24 hr tablet Commonly known as: DITROPAN-XL Take one tablet am and pm   pantoprazole 40 MG tablet Commonly known as: PROTONIX TAKE 1 TABLET BY MOUTH  DAILY   predniSONE 20 MG tablet Commonly known as: DELTASONE 3 tablets daily x 4 days   rosuvastatin 20 MG tablet Commonly known as: Crestor Take 1 tablet (20 mg total) by mouth daily.   sertraline 100 MG tablet Commonly known as: ZOLOFT TAKE 1 TABLET BY MOUTH DAILY   thiamine 100 MG tablet Commonly known as: Vitamin B-1 Take 1 tablet (100 mg total) by mouth daily.   Vitamin D 1000 units capsule Take 1,000 Units by mouth daily.        Allergies:  Allergies  Allergen Reactions   Cozaar [Losartan] Swelling   Septra [Sulfamethoxazole-Trimethoprim] Anaphylaxis   Zinc Gelatin [Zinc] Anaphylaxis   Alpha-Gal    Amlodipine Besylate     REACTION: edema   Atorvastatin     REACTION: muscle pain   Beef Allergy    Cat Hair Extract    Dust Mite Extract    Neomycin-Bacitracin Zn-Polymyx     REACTION: rash Per pt not sure if from meds or not    Family History: Family History  Problem Relation Age of Onset   Transient ischemic attack Mother    Hypertension Mother    Hypertension Father    Colon cancer Neg Hx    Esophageal cancer Neg Hx    Rectal cancer Neg Hx    Stomach cancer Neg Hx     Social History:   reports that she quit smoking about 26 years ago. Her smoking use included cigarettes. She has never used smokeless tobacco. She reports current alcohol use of about 4.0 standard drinks of alcohol per week. She reports that she does not use drugs.  Physical Exam: There were no vitals taken for this visit.  Constitutional:  Alert and oriented, no acute distress, nontoxic appearing HEENT: [place], AT Cardiovascular: No clubbing, cyanosis, or edema Respiratory: Normal respiratory effort, no increased work of breathing Skin: No rashes, bruises or suspicious lesions Neurologic: Grossly intact, no focal deficits, moving all 4 extremities Psychiatric: Normal mood and affect  Assessment & Plan:   1. Mixed stress and urge urinary incontinence Unclear benefit from PTNS. She  prefers to defer today but wants to schedule for next month. She will consider Botox and Interstim in the interim.  2. Vulvovaginal candidiasis Vulvovaginal itching/irritation after antibiotics for UTI consistent with yeast infection. Will treat topically. - clotrimazole (GYNE-LOTRIMIN 3) 2 % vaginal cream 1 Applicatorful   Return in about 4 weeks (around 12/12/2022) for PTNS maintenance.  Carman Ching, PA-C  [facility] [place] [address], [address] [place], [place] [address] [phone]

## 2022-11-15 ENCOUNTER — Telehealth: Payer: Self-pay

## 2022-11-15 ENCOUNTER — Other Ambulatory Visit: Payer: Self-pay | Admitting: Physician Assistant

## 2022-11-15 DIAGNOSIS — B3731 Acute candidiasis of vulva and vagina: Secondary | ICD-10-CM

## 2022-11-15 MED ORDER — CLOTRIMAZOLE 2 % VA CREA
1.0000 | TOPICAL_CREAM | Freq: Every day | VAGINAL | 0 refills | Status: DC
Start: 2022-11-15 — End: 2022-11-15

## 2022-11-15 MED ORDER — CLOTRIMAZOLE 3 2 % VA CREA
1.0000 | TOPICAL_CREAM | Freq: Every day | VAGINAL | 0 refills | Status: AC
Start: 2022-11-15 — End: 2022-11-18

## 2022-11-15 NOTE — Telephone Encounter (Signed)
Pt calls triage line and states that she was supposed to have a medication sent into the pharmacy for her yesterday however she has called the pharmacy and they do not have anything for her. It appears the medication was ordered as clinic administered only, RX corrected to normal and e-scribed to the pharmacy. Pt voiced understanding.

## 2022-11-19 ENCOUNTER — Other Ambulatory Visit: Payer: Self-pay

## 2022-11-19 ENCOUNTER — Emergency Department
Admission: EM | Admit: 2022-11-19 | Discharge: 2022-11-19 | Disposition: A | Discharge status: Home / Self Care | Payer: Medicare Other | Attending: Emergency Medicine | Admitting: Emergency Medicine

## 2022-11-19 DIAGNOSIS — I10 Essential (primary) hypertension: Secondary | ICD-10-CM | POA: Diagnosis not present

## 2022-11-19 DIAGNOSIS — T7840XA Allergy, unspecified, initial encounter: Secondary | ICD-10-CM

## 2022-11-19 MED ORDER — PREDNISONE 20 MG PO TABS: 40.0000 mg | ORAL_TABLET | Freq: Every day | ORAL | 0 refills | Status: AC

## 2022-11-19 MED ORDER — HYDROCHLOROTHIAZIDE 25 MG PO TABS
25.0000 mg | ORAL_TABLET | Freq: Every day | ORAL | Status: DC
  Administered 2022-11-19: 25 mg via ORAL
  Filled 2022-11-19: qty 1

## 2022-11-19 MED ORDER — FAMOTIDINE IN NACL 20-0.9 MG/50ML-% IV SOLN
20.0000 mg | Freq: Once | INTRAVENOUS | Status: AC
  Administered 2022-11-19: 20 mg via INTRAVENOUS
  Filled 2022-11-19: qty 50

## 2022-11-19 MED ORDER — METOPROLOL TARTRATE 25 MG PO TABS
25.0000 mg | ORAL_TABLET | Freq: Once | ORAL | Status: AC
  Administered 2022-11-19: 25 mg via ORAL
  Filled 2022-11-19: qty 1

## 2022-11-19 MED ORDER — METHYLPREDNISOLONE SODIUM SUCC 125 MG IJ SOLR
125.0000 mg | Freq: Once | INTRAMUSCULAR | Status: AC
  Administered 2022-11-19: 125 mg via INTRAVENOUS
  Filled 2022-11-19: qty 2

## 2022-11-19 MED ORDER — DIPHENHYDRAMINE HCL 50 MG/ML IJ SOLN
25.0000 mg | Freq: Once | INTRAMUSCULAR | Status: AC
  Administered 2022-11-19: 25 mg via INTRAVENOUS
  Filled 2022-11-19: qty 1

## 2022-11-19 MED ORDER — SODIUM CHLORIDE 0.9 % IV BOLUS
1000.0000 mL | Freq: Once | INTRAVENOUS | Status: AC
  Administered 2022-11-19: 1000 mL via INTRAVENOUS

## 2022-11-19 NOTE — ED Triage Notes (Signed)
Pt states an allergic reaction. Pt states tongue swelling, but no current difficulty breathing. Pt states allergy to gelatin, which may have been in a snack she had last night. Pt states the tongue started to swell last night, but that it is getting better now. Pt states taking 50mg  of benadryl before coming in. Pt states being light headed, which she noted when waking up.

## 2022-11-19 NOTE — ED Provider Notes (Signed)
[facility] Provider Note    Event Date/Time   First MD Initiated Contact with Patient 11/19/22 1001     (approximate)  History   Chief Complaint: Allergic Reaction  HPI  Sharon [name] is a 72 y.o. female with a past medical history of angioedema, allergies, gastric reflux, hypertension, hyperlipidemia presents to the emergency department with tongue swelling.  According to the patient she has a long list of allergies and often times presents with tongue swelling.  Patient states she had to be intubated a little over a year ago for the same.  Patient states last night she ate orange sherbet ice cream and she thinks maybe it contained gelatin which is one of her allergies.  Patient states she began feeling tongue swelling and fullness last night took Benadryl last night and again this morning but felt like the tongue was still swollen so she came to the emergency department for evaluation.  Denies any trouble breathing or swallowing at the moment.  Denies any hives or itching.  Physical Exam   Triage Vital Signs: ED Triage Vitals [11/19/22 0954]  Encounter Vitals Group     BP (!) 189/101     Systolic BP Percentile      Diastolic BP Percentile      Pulse Rate 74     Resp 18     Temp 98.3 F (36.8 C)     Temp src      SpO2 98 %     Weight 214 lb (97.1 kg)     Height 5\' 6"  (1.676 m)     Head Circumference      Peak Flow      Pain Score 0     Pain Loc      Pain Education      Exclude from Growth Chart     Most recent vital signs: Vitals:   11/19/22 0954  BP: (!) 189/101  Pulse: 74  Resp: 18  Temp: 98.3 F (36.8 C)  SpO2: 98%    General: Awake, no distress.  CV:  Good peripheral perfusion.  Regular rate and rhythm  Resp:  Normal effort.  Equal breath sounds bilaterally.  No wheeze rales or rhonchi.   Abd:  No distention.  Soft, nontender.  No rebound or guarding. Other:  No to minimal tongue edema noted.  No pharyngeal edema.  Widely  patent oropharynx currently.  No hives or rash noted.   ED Results / Procedures / Treatments   EKG  EKG viewed and interpreted by myself shows a normal sinus rhythm at 74 bpm with a narrow QRS, normal axis, normal intervals, no concerning ST changes   MEDICATIONS ORDERED IN ED: Medications  diphenhydrAMINE (BENADRYL) injection 25 mg (has no administration in time range)  famotidine (PEPCID) IVPB 20 mg premix (has no administration in time range)  sodium chloride 0.9 % bolus 1,000 mL (has no administration in time range)  methylPREDNISolone sodium succinate (SOLU-MEDROL) 125 mg/2 mL injection 125 mg (has no administration in time range)     IMPRESSION / MDM / ASSESSMENT AND PLAN / ED COURSE  I reviewed the triage vital signs and the nursing notes.  Patient's presentation is most consistent with acute presentation with potential threat to life or bodily function.  Patient presents emergency department for likely allergic reaction.  Patient states a history of multiple allergic reactions in the past leading to tongue swelling.  Patient states mild tongue swelling currently, possible mild tongue swelling on  exam does not appear significant at least at the moment.  No pharyngeal edema noted widely patent oropharynx.  Clear lung sounds without wheeze, no rash or hives.  Patient took 50 mg of oral Benadryl this morning.  Will dose 25 mg of IV Benadryl, 1 L normal saline, 20 mg IV Pepcid and 125 mg of Solu-Medrol.  Will reassess and continue to closely monitor.  Patient states he is feeling much better.  Denies any sensation of tongue swelling.  Oropharynx remains widely patent on my repeat examination.  Will discharge patient home with 5 days of prednisone.  Patient has EpiPen's to use if needed.  Discussed my typical allergy return precautions.  FINAL CLINICAL IMPRESSION(S) / ED DIAGNOSES   Allergic reaction  Note:  This document was prepared using Dragon voice recognition software and may  include unintentional dictation errors.   Minna Antis, MD 11/19/22 1245

## 2022-11-19 NOTE — ED Notes (Signed)
Pt reports that tongue swelling is already resolving.

## 2022-11-19 NOTE — ED Notes (Addendum)
MD notified of BP, Pt stated she did not take her BP meds this AM. Also informed MD of dizziness and stumbling slightly when assisting Pt to commode in room. Pt states this is not normal for her and she does not utilize a cane or walker @home .

## 2022-11-24 ENCOUNTER — Telehealth: Payer: Self-pay | Admitting: *Deleted

## 2022-11-24 NOTE — Telephone Encounter (Signed)
Transition Care Management Unsuccessful Follow-up Telephone Call  Date of discharge and from where:  [facility] 11/19/22  Attempts:  1st Attempt  Reason for unsuccessful TCM follow-up call:  Left voice message

## 2022-11-27 ENCOUNTER — Telehealth: Payer: Self-pay | Admitting: *Deleted

## 2022-11-27 NOTE — Telephone Encounter (Signed)
Transition Care Management Unsuccessful Follow-up Telephone Call  Date of discharge and from where:  [facility]  Attempts:  2nd Attempt  Reason for unsuccessful TCM follow-up call:  Left voice message

## 2022-11-30 ENCOUNTER — Emergency Department
Admission: EM | Admit: 2022-11-30 | Discharge: 2022-11-30 | Disposition: A | Discharge status: Home / Self Care | Payer: Medicare Other | Attending: Emergency Medicine | Admitting: Emergency Medicine

## 2022-11-30 ENCOUNTER — Encounter: Payer: Self-pay | Admitting: Emergency Medicine

## 2022-11-30 ENCOUNTER — Other Ambulatory Visit: Payer: Self-pay

## 2022-11-30 DIAGNOSIS — T783XXA Angioneurotic edema, initial encounter: Secondary | ICD-10-CM | POA: Diagnosis not present

## 2022-11-30 DIAGNOSIS — I10 Essential (primary) hypertension: Secondary | ICD-10-CM | POA: Insufficient documentation

## 2022-11-30 DIAGNOSIS — R220 Localized swelling, mass and lump, head: Secondary | ICD-10-CM | POA: Diagnosis present

## 2022-11-30 MED ORDER — METHYLPREDNISOLONE SODIUM SUCC 125 MG IJ SOLR
125.0000 mg | Freq: Once | INTRAMUSCULAR | Status: AC
  Administered 2022-11-30: 125 mg via INTRAVENOUS
  Filled 2022-11-30: qty 2

## 2022-11-30 MED ORDER — EPINEPHRINE 0.3 MG/0.3ML IJ SOAJ
0.3000 mg | Freq: Once | INTRAMUSCULAR | Status: AC
  Administered 2022-11-30: 0.3 mg via INTRAMUSCULAR
  Filled 2022-11-30: qty 0.3

## 2022-11-30 MED ORDER — SODIUM CHLORIDE 0.9 % IV BOLUS
1000.0000 mL | Freq: Once | INTRAVENOUS | Status: AC
  Administered 2022-11-30: 1000 mL via INTRAVENOUS

## 2022-11-30 MED ORDER — DIPHENHYDRAMINE HCL 50 MG/ML IJ SOLN
25.0000 mg | Freq: Once | INTRAMUSCULAR | Status: AC
  Administered 2022-11-30: 25 mg via INTRAVENOUS
  Filled 2022-11-30: qty 1

## 2022-11-30 MED ORDER — FAMOTIDINE IN NACL 20-0.9 MG/50ML-% IV SOLN
20.0000 mg | Freq: Once | INTRAVENOUS | Status: AC
  Administered 2022-11-30: 20 mg via INTRAVENOUS
  Filled 2022-11-30: qty 50

## 2022-11-30 NOTE — ED Notes (Signed)
Pt states her symptoms haven't changed; provider notified. RN observed some decrease in tongue swelling. RN standby to use toilet. Pt walked with a steady gait to and from toilet.

## 2022-11-30 NOTE — ED Notes (Addendum)
EDT answered call bell. Pt was disconnected from monitoring equipment so she could go to bathroom and was able to void and ambulate without assistance. Pt reconnected to all monitoring devices and positioned in position of comfort. Blankets provided and call bell still within reach. Bedside table moved within pts reach per request and one bed rail left down per request. Pt denied needing anything further at this time.

## 2022-11-30 NOTE — ED Provider Notes (Signed)
[facility] Provider Note    Event Date/Time   First MD Initiated Contact with Patient 11/30/22 1929     (approximate)  History   Chief Complaint: Allergic Reaction  HPI  Sharon [name] is a 72 y.o. female with a past medical history of angioedema, anxiety, hypertension, presents to the emergency department for tongue swelling.  According to the patient she has been experiencing left-sided tongue swelling since around 4 PM today.  Patient has a history of recurrent angioedema/tongue swelling in the past.  No new allergies or exposures per patient.  Patient took 50 mg of Benadryl, a Zyrtec as well as a Pepcid tablet prior to arrival.  Denies any trouble breathing but does state moderate swelling of the left side of her tongue.  Patient is not on any ACE inhibitor's or ARB's.  Physical Exam   Triage Vital Signs: ED Triage Vitals  Encounter Vitals Group     BP 11/30/22 1923 (!) 153/79     Systolic BP Percentile --      Diastolic BP Percentile --      Pulse Rate 11/30/22 1923 79     Resp 11/30/22 1923 17     Temp 11/30/22 1923 98.6 F (37 C)     Temp src --      SpO2 11/30/22 1923 98 %     Weight 11/30/22 1923 214 lb (97.1 kg)     Height 11/30/22 1923 5\' 6"  (1.676 m)     Head Circumference --      Peak Flow --      Pain Score 11/30/22 1923 0     Pain Loc --      Pain Education --      Exclude from Growth Chart --     Most recent vital signs: Vitals:   11/30/22 1923  BP: (!) 153/79  Pulse: 79  Resp: 17  Temp: 98.6 F (37 C)  SpO2: 98%    General: Awake, no distress.  Patient has mild to moderate swelling of the left side of her tongue.  Widely patent oropharynx. CV:  Good peripheral perfusion.  Regular rate and rhythm  Resp:  Normal effort.  Equal breath sounds bilaterally.  No wheeze rales or rhonchi Abd:  No distention.  Soft, nontender.  No rebound or guarding.   ED Results / Procedures / Treatments   MEDICATIONS ORDERED IN  ED: Medications  methylPREDNISolone sodium succinate (SOLU-MEDROL) 125 mg/2 mL injection 125 mg (has no administration in time range)  sodium chloride 0.9 % bolus 1,000 mL (has no administration in time range)  famotidine (PEPCID) IVPB 20 mg premix (has no administration in time range)  diphenhydrAMINE (BENADRYL) injection 25 mg (has no administration in time range)  EPINEPHrine (EPI-PEN) injection 0.3 mg (has no administration in time range)     IMPRESSION / MDM / ASSESSMENT AND PLAN / ED COURSE  I reviewed the triage vital signs and the nursing notes.  Patient's presentation is most consistent with acute presentation with potential threat to life or bodily function.  Patient presents emergency department for left-sided tongue swelling.  History of angioedema/allergies in the past.  No ACE inhibitor or ARB.  No known exposures.  Patient does have mild to moderate left-sided tongue swelling currently.  No throat swelling.  No trouble breathing.  We will dose Solu-Medrol, 25 mg of Benadryl, 20 mg IV Pepcid.  Given this tongue swelling we will dose IM epinephrine.  Will continue to closely monitor the  patient in the emergency department.  Patient has now been in the emergency department over 3 hours.  Patient states she is feeling much better.  On my repeat examination patient deftly has diminished tongue swelling but is still mildly swollen on the left.  Patient states to her it feels much improved and she is ready to go home.  States she has been through this multiple times once and starts going down typically it resolves.  Patient wishes to go home.  She will return if there is any worsening.  We will discharge from the emergency department.  FINAL CLINICAL IMPRESSION(S) / ED DIAGNOSES   Angioedema   Note:  This document was prepared using Dragon voice recognition software and may include unintentional dictation errors.   Minna Antis, MD 11/30/22 2213

## 2022-11-30 NOTE — ED Triage Notes (Signed)
Patient ambulatory to triage with steady gait, without difficulty or distress noted; pt reports swelling to tongue since approx 430pm with no known cause; st that she has multiple allergies with hx of same; took 50mg  benadryl, 20mg  zyrtec and 1 pepcid PTA without relief; did not take her epipen because she st "she is afraid of it"

## 2022-11-30 NOTE — ED Notes (Signed)
Pt states she is feeling better. Tongue swelling is still present, but has decreased.

## 2022-12-06 ENCOUNTER — Telehealth: Payer: Self-pay

## 2022-12-06 ENCOUNTER — Other Ambulatory Visit: Payer: Self-pay | Admitting: Family Medicine

## 2022-12-06 NOTE — Telephone Encounter (Signed)
Transition Care Management Unsuccessful Follow-up Telephone Call  Date of discharge and from where:  11/30/2022 [facility]  Attempts:  1st Attempt  Reason for unsuccessful TCM follow-up call:  Left voice message   [facility]  [facility] Population Health Community Resource Care Guide   [email or web address]  ?? [phone]   Website: [email or web address]  [email or web address]

## 2022-12-07 ENCOUNTER — Telehealth: Payer: Self-pay

## 2022-12-07 NOTE — Telephone Encounter (Signed)
Pt is due for her CPE (labs prior if possible) on or after 12/21/22, please schedule and then route back to me to refill, thanks

## 2022-12-07 NOTE — Telephone Encounter (Signed)
LVM for patient to c/b and schedule.  

## 2022-12-07 NOTE — Telephone Encounter (Signed)
Transition Care Management Unsuccessful Follow-up Telephone Call  Date of discharge and from where:  11/30/2022 [facility]  Attempts:  2nd Attempt  Reason for unsuccessful TCM follow-up call:  Left voice message   [facility]  [facility] Population Health Community Resource Care Guide   [email or web address]  ?? [phone]   Website: [email or web address]  [email or web address]

## 2022-12-08 NOTE — Telephone Encounter (Signed)
Patient has been scheduled

## 2022-12-12 ENCOUNTER — Other Ambulatory Visit: Payer: Medicare Other

## 2022-12-12 DIAGNOSIS — R300 Dysuria: Secondary | ICD-10-CM

## 2022-12-12 DIAGNOSIS — B9620 Unspecified Escherichia coli [E. coli] as the cause of diseases classified elsewhere: Secondary | ICD-10-CM

## 2022-12-12 LAB — URINALYSIS, COMPLETE
Bilirubin, UA: NEGATIVE
Glucose, UA: NEGATIVE
Ketones, UA: NEGATIVE
Nitrite, UA: NEGATIVE
Specific Gravity, UA: 1.015 (ref 1.005–1.030)
Urobilinogen, Ur: 0.2 mg/dL (ref 0.2–1.0)
pH, UA: 5.5 (ref 5.0–7.5)

## 2022-12-12 LAB — MICROSCOPIC EXAMINATION: WBC, UA: >30 /hpf — AB (ref 0–5)

## 2022-12-14 ENCOUNTER — Ambulatory Visit: Payer: Medicare Other | Admitting: Physician Assistant

## 2022-12-14 DIAGNOSIS — R8271 Bacteriuria: Secondary | ICD-10-CM

## 2022-12-14 DIAGNOSIS — N3946 Mixed incontinence: Secondary | ICD-10-CM

## 2022-12-14 DIAGNOSIS — R300 Dysuria: Secondary | ICD-10-CM

## 2022-12-14 DIAGNOSIS — R3129 Other microscopic hematuria: Secondary | ICD-10-CM | POA: Diagnosis not present

## 2022-12-14 DIAGNOSIS — R350 Frequency of micturition: Secondary | ICD-10-CM

## 2022-12-14 DIAGNOSIS — R3915 Urgency of urination: Secondary | ICD-10-CM | POA: Diagnosis not present

## 2022-12-14 DIAGNOSIS — R8281 Pyuria: Secondary | ICD-10-CM | POA: Diagnosis not present

## 2022-12-14 DIAGNOSIS — N390 Urinary tract infection, site not specified: Secondary | ICD-10-CM

## 2022-12-14 MED ORDER — NITROFURANTOIN MONOHYD MACRO 100 MG PO CAPS
100.0000 mg | ORAL_CAPSULE | Freq: Two times a day (BID) | ORAL | 0 refills | Status: DC
Start: 2022-12-14 — End: 2022-12-21

## 2022-12-14 NOTE — Progress Notes (Signed)
PTNS  Session # 14  Health & Social Factors: no change Caffeine: 1 Alcohol: 0 Daytime voids #per day: 5 Night-time voids #per night: 3 Urgency: strong Incontinence Episodes #per day: strong Ankle used: right Treatment Setting: 6 Feeling/ Response: sensory Comments: pt tolerated well  Performed By: Malcolm Metro RMA  Additional notes: Patient reports onset of dysuria and increased urgency/frequency 6 days ago.  She dropped off a urine specimen earlier this week, which appears grossly infected with pyuria, microscopic hematuria, and bacteriuria.  Urine culture is pending.  Will start empiric Macrobid.  She is in agreement with this plan.  Follow Up: 1 mth maintenance PTNS

## 2022-12-14 NOTE — Progress Notes (Deleted)
PTNS   Session # 13   Health & Social Factors: None Caffeine: 2 Alcohol: 0 Daytime voids #per day: 5 Night-time voids 5 Urgency: severe Incontinence Episodes #per day:3 Ankle used: right Treatment Setting: 12 Feeling/ Response: Sensory Comments: Patient tolerated well.   Performed By:    Follow Up: 1 month

## 2022-12-16 LAB — CULTURE, URINE COMPREHENSIVE

## 2022-12-21 ENCOUNTER — Other Ambulatory Visit: Payer: Self-pay | Admitting: Physician Assistant

## 2022-12-21 DIAGNOSIS — N390 Urinary tract infection, site not specified: Secondary | ICD-10-CM

## 2022-12-21 MED ORDER — NITROFURANTOIN MONOHYD MACRO 100 MG PO CAPS
100.0000 mg | ORAL_CAPSULE | Freq: Two times a day (BID) | ORAL | 0 refills | Status: AC
Start: 2022-12-21 — End: 2022-12-24

## 2022-12-22 ENCOUNTER — Other Ambulatory Visit: Payer: Self-pay | Admitting: Family Medicine

## 2023-01-01 ENCOUNTER — Telehealth: Payer: Self-pay | Admitting: Family Medicine

## 2023-01-01 DIAGNOSIS — E559 Vitamin D deficiency, unspecified: Secondary | ICD-10-CM

## 2023-01-01 DIAGNOSIS — R7303 Prediabetes: Secondary | ICD-10-CM

## 2023-01-01 DIAGNOSIS — I10 Essential (primary) hypertension: Secondary | ICD-10-CM

## 2023-01-01 DIAGNOSIS — E039 Hypothyroidism, unspecified: Secondary | ICD-10-CM

## 2023-01-01 DIAGNOSIS — Z79899 Other long term (current) drug therapy: Secondary | ICD-10-CM | POA: Insufficient documentation

## 2023-01-01 DIAGNOSIS — E782 Mixed hyperlipidemia: Secondary | ICD-10-CM

## 2023-01-01 NOTE — Telephone Encounter (Signed)
-----   Message from Lovena Neighbours sent at 12/19/2022 11:14 AM EDT ----- Regarding: 9.10.24 Please put physical lab orders in future. Thank you, Denny Peon

## 2023-01-02 ENCOUNTER — Other Ambulatory Visit (INDEPENDENT_AMBULATORY_CARE_PROVIDER_SITE_OTHER): Payer: Medicare Other

## 2023-01-02 DIAGNOSIS — R7303 Prediabetes: Secondary | ICD-10-CM | POA: Diagnosis not present

## 2023-01-02 DIAGNOSIS — I10 Essential (primary) hypertension: Secondary | ICD-10-CM | POA: Diagnosis not present

## 2023-01-02 DIAGNOSIS — E039 Hypothyroidism, unspecified: Secondary | ICD-10-CM | POA: Diagnosis not present

## 2023-01-02 DIAGNOSIS — E782 Mixed hyperlipidemia: Secondary | ICD-10-CM | POA: Diagnosis not present

## 2023-01-02 DIAGNOSIS — E559 Vitamin D deficiency, unspecified: Secondary | ICD-10-CM

## 2023-01-02 DIAGNOSIS — Z79899 Other long term (current) drug therapy: Secondary | ICD-10-CM

## 2023-01-02 LAB — CBC WITH DIFFERENTIAL/PLATELET
Basophils Absolute: 0.1 10*3/uL (ref 0.0–0.1)
Basophils Relative: 0.9 % (ref 0.0–3.0)
Eosinophils Absolute: 0.7 10*3/uL (ref 0.0–0.7)
Eosinophils Relative: 8.1 % — ABNORMAL HIGH (ref 0.0–5.0)
HCT: 34.7 % — ABNORMAL LOW (ref 36.0–46.0)
Hemoglobin: 11 g/dL — ABNORMAL LOW (ref 12.0–15.0)
Lymphocytes Relative: 33.9 % (ref 12.0–46.0)
Lymphs Abs: 2.8 10*3/uL (ref 0.7–4.0)
MCHC: 31.8 g/dL (ref 30.0–36.0)
MCV: 88.9 fl (ref 78.0–100.0)
Monocytes Absolute: 0.7 10*3/uL (ref 0.1–1.0)
Monocytes Relative: 8.7 % (ref 3.0–12.0)
Neutro Abs: 4.1 10*3/uL (ref 1.4–7.7)
Neutrophils Relative %: 48.4 % (ref 43.0–77.0)
Platelets: 195 10*3/uL (ref 150.0–400.0)
RBC: 3.9 Mil/uL (ref 3.87–5.11)
RDW: 15.2 % (ref 11.5–15.5)
WBC: 8.4 10*3/uL (ref 4.0–10.5)

## 2023-01-02 LAB — LIPID PANEL
Cholesterol: 144 mg/dL (ref 0–200)
HDL: 61 mg/dL (ref >39.00)
LDL Cholesterol: 46 mg/dL (ref 0–99)
NonHDL: 83
Total CHOL/HDL Ratio: 2
Triglycerides: 185 mg/dL — ABNORMAL HIGH (ref 0.0–149.0)
VLDL: 37 mg/dL (ref 0.0–40.0)

## 2023-01-02 LAB — COMPREHENSIVE METABOLIC PANEL
ALT: 9 U/L (ref 0–35)
AST: 19 U/L (ref 0–37)
Albumin: 3.8 g/dL (ref 3.5–5.2)
Alkaline Phosphatase: 66 U/L (ref 39–117)
BUN: 14 mg/dL (ref 6–23)
CO2: 29 meq/L (ref 19–32)
Calcium: 9.2 mg/dL (ref 8.4–10.5)
Chloride: 104 meq/L (ref 96–112)
Creatinine, Ser: 0.94 mg/dL (ref 0.40–1.20)
GFR: 60.85 mL/min (ref >60.00)
Glucose, Bld: 83 mg/dL (ref 70–99)
Potassium: 4.2 meq/L (ref 3.5–5.1)
Sodium: 138 meq/L (ref 135–145)
Total Bilirubin: 0.5 mg/dL (ref 0.2–1.2)
Total Protein: 6.4 g/dL (ref 6.0–8.3)

## 2023-01-02 LAB — HEMOGLOBIN A1C: Hgb A1c MFr Bld: 6.2 % (ref 4.6–6.5)

## 2023-01-02 LAB — VITAMIN D 25 HYDROXY (VIT D DEFICIENCY, FRACTURES): VITD: 24.95 ng/mL — ABNORMAL LOW (ref 30.00–100.00)

## 2023-01-02 LAB — TSH: TSH: 3.18 u[IU]/mL (ref 0.35–5.50)

## 2023-01-02 LAB — VITAMIN B12: Vitamin B-12: 191 pg/mL — ABNORMAL LOW (ref 211–911)

## 2023-01-09 ENCOUNTER — Ambulatory Visit (INDEPENDENT_AMBULATORY_CARE_PROVIDER_SITE_OTHER): Payer: Medicare Other | Admitting: Family Medicine

## 2023-01-09 ENCOUNTER — Encounter: Payer: Self-pay | Admitting: Family Medicine

## 2023-01-09 VITALS — BP 136/82 | HR 70 | Temp 97.9°F | Ht 66.25 in | Wt 211.2 lb

## 2023-01-09 DIAGNOSIS — I10 Essential (primary) hypertension: Secondary | ICD-10-CM

## 2023-01-09 DIAGNOSIS — E6609 Other obesity due to excess calories: Secondary | ICD-10-CM

## 2023-01-09 DIAGNOSIS — I5032 Chronic diastolic (congestive) heart failure: Secondary | ICD-10-CM

## 2023-01-09 DIAGNOSIS — D649 Anemia, unspecified: Secondary | ICD-10-CM | POA: Diagnosis not present

## 2023-01-09 DIAGNOSIS — Z0000 Encounter for general adult medical examination without abnormal findings: Secondary | ICD-10-CM | POA: Diagnosis not present

## 2023-01-09 DIAGNOSIS — Z23 Encounter for immunization: Secondary | ICD-10-CM

## 2023-01-09 DIAGNOSIS — E559 Vitamin D deficiency, unspecified: Secondary | ICD-10-CM

## 2023-01-09 DIAGNOSIS — E538 Deficiency of other specified B group vitamins: Secondary | ICD-10-CM | POA: Diagnosis not present

## 2023-01-09 DIAGNOSIS — E039 Hypothyroidism, unspecified: Secondary | ICD-10-CM | POA: Diagnosis not present

## 2023-01-09 DIAGNOSIS — E782 Mixed hyperlipidemia: Secondary | ICD-10-CM | POA: Diagnosis not present

## 2023-01-09 DIAGNOSIS — T783XXD Angioneurotic edema, subsequent encounter: Secondary | ICD-10-CM

## 2023-01-09 DIAGNOSIS — R0683 Snoring: Secondary | ICD-10-CM

## 2023-01-09 DIAGNOSIS — R7303 Prediabetes: Secondary | ICD-10-CM

## 2023-01-09 DIAGNOSIS — Z1211 Encounter for screening for malignant neoplasm of colon: Secondary | ICD-10-CM

## 2023-01-09 DIAGNOSIS — I471 Supraventricular tachycardia, unspecified: Secondary | ICD-10-CM

## 2023-01-09 DIAGNOSIS — Z79899 Other long term (current) drug therapy: Secondary | ICD-10-CM | POA: Diagnosis not present

## 2023-01-09 MED ORDER — CYANOCOBALAMIN 1000 MCG/ML IJ SOLN
1000.0000 ug | Freq: Once | INTRAMUSCULAR | Status: AC
Start: 2023-01-09 — End: 2023-01-09
  Administered 2023-01-09: 1000 ug via INTRAMUSCULAR

## 2023-01-09 NOTE — Assessment & Plan Note (Signed)
With fatigue Poor sleep Day time somnolence and obesity   Sleep apnea is likely  Ref made to pulm for eval

## 2023-01-09 NOTE — Assessment & Plan Note (Signed)
Pt notes some pedal edema  Sees cardiology  Taking metoprolol and hydrochlorothiazide No shortness of breath

## 2023-01-09 NOTE — Assessment & Plan Note (Signed)
Last vitamin D Lab Results  Component Value Date   VD25OH 24.95 (L) 01/02/2023   Encouraged to be more compliant with supplement 2000 international units daily

## 2023-01-09 NOTE — Assessment & Plan Note (Addendum)
Reviewed health habits including diet and exercise and skin cancer prevention Reviewed appropriate screening tests for age  Also reviewed health mt list, fam hx and immunization status , as well as social and family history   See HPI Labs reviewed and ordered Flu shot today  Plans 2nd shingrix vaccine affer 9/22 when due  Tetanus shot due-will inq about this at pharmacy Mammogram utd 04/2022 Dexa - due for 5 y (last normal) and pt declines  No falls or fracture Discussed fall prevention, supplements and exercise for bone density  PHQ affected by fatigue and poor sleep

## 2023-01-09 NOTE — Assessment & Plan Note (Signed)
Pt may be interested in cologuard /not sure yet Does not want to do colonoscopy as first line screen  She plans to call and tell us if she wants it ordered

## 2023-01-09 NOTE — Assessment & Plan Note (Signed)
bp is close to goal bp in fair control at this time  BP Readings from Last 1 Encounters:  01/09/23 136/82   Most recent labs reviewed  Disc lifstyle change with low sodium diet and exercise  Enc to re start metoprolol 25 mg bid that she stopped or ran out of (from cardiology) Continue hctz 25 mg daily  Hydralazine 12.5 mg tid- Will continue to follow with cardiology (also DD )

## 2023-01-09 NOTE — Assessment & Plan Note (Signed)
Disc goals for lipids and reasons to control them Rev last labs with pt Rev low sat fat diet in detail LDL donw to 46 with better compliance/crestor   Due for refill of zetia from her cardiologist

## 2023-01-09 NOTE — Progress Notes (Signed)
Subjective:    Patient ID: Sharon [name], female    DOB: 26-Feb-1951, 72 y.o.   MRN: [id number]  HPI  Here for health maintenance exam and to review chronic medical problems   Wt Readings from Last 3 Encounters:  01/09/23 211 lb 4 oz (95.8 kg)  11/30/22 214 lb (97.1 kg)  11/19/22 214 lb (97.1 kg)   33.84 kg/m  Vitals:   01/09/23 1138  BP: 136/82  Pulse: 70  Temp: 97.9 F (36.6 C)  SpO2: 95%    Immunization History  Administered Date(s) Administered   Fluad Quad(high Dose 65+) 01/24/2019   Fluad Trivalent(High Dose 65+) 01/09/2023   Influenza Split 03/29/2011, 03/12/2012   Influenza, High Dose Seasonal PF 01/27/2022   Influenza,inj,Quad PF,6+ Mos 03/03/2013, 03/25/2014, 01/24/2016, 02/08/2017, 01/28/2018   Influenza-Unspecified 01/31/2021   PFIZER Comirnaty(Gray Top)Covid-19 Tri-Sucrose Vaccine 01/27/2022   PFIZER(Purple Top)SARS-COV-2 Vaccination 06/08/2019, 07/01/2019, 02/02/2020   Pfizer Covid-19 Vaccine Bivalent Booster 98yrs & up 01/30/2021   Pneumococcal Conjugate-13 08/22/2016   Pneumococcal Polysaccharide-23 08/27/2017   Td 04/24/1998   Zoster Recombinant(Shingrix) 11/13/2022   Zoster, Live 04/10/2013    Health Maintenance Due  Topic Date Due   DTaP/Tdap/Td (2 - Tdap) 04/24/2008   Zoster Vaccines- Shingrix (2 of 2) 01/08/2023   Medicare Annual Wellness (AWV)  02/22/2023   Per chart had uti last mo Saw urology and treatment with macrobid Has follow up with urology on 9/22  Takes oxybutinin for incontinence   Mole on leg -there for years    Has recurrent angioedema  Last ER visit was 11/30/22   Shingrix vaccine - had the first one and then will get the 2nd one on 9/22   Flu shot - today   Tetanus shot -will inquire at pharmacy    Mammogram 04/2022  Self breast exam: no breast lumps   Gyn health Had a vaginal yeast infection with last uti-was treated  Better now    Colon cancer screening - 04/2013 with 10 y recall  Considering  cologuard/will think about it  Colonoscopy is very tough on her    Bone health  Dexa 03/2018 normal bmd range  Declines another one right now  Falls- none  Fractures-none  Supplements  Last vitamin D Lab Results  Component Value Date   VD25OH 24.95 (L) 01/02/2023   Has the vitamin d  Not taking it   Exercise : no energy to do   Very very sleepy  Does not sleep well    Mood    06/09/2022    2:52 PM 02/21/2022   11:42 AM 12/17/2019    3:33 PM 08/28/2018    7:57 PM 08/27/2017   12:26 PM  Depression screen PHQ 2/9  Decreased Interest 0 0 0 0 0  Down, Depressed, Hopeless 0 0 0  0  PHQ - 2 Score 0 0 0 0 0  Altered sleeping 0 3 1  0  Tired, decreased energy 0 0 1  0  Change in appetite 0 0 0  0  Feeling bad or failure about yourself  0 0 0  0  Trouble concentrating 0 0 0  0  Moving slowly or fidgety/restless 0 0 0  0  Suicidal thoughts 0 0 0  0  PHQ-9 Score 0 3 2  0  Difficult doing work/chores Not difficult at all Not difficult at all   Not difficult at all   Zoloft 100 mg daily   ? Sleep apnea   Etoh intake 2 drinks  per week  Takes thiamine   HTN bp is stable today  No cp or palpitations or headaches or edema  No side effects to medicines  BP Readings from Last 3 Encounters:  01/09/23 136/82  11/30/22 (!) 164/87  11/19/22 (!) 167/85    Hydrochlorothiazide 25 mg daily  Hydralazine 12.5 mg tid  Metoprolol 25 mg bid   Cardiology sees her for DD and SVT as well  On asa low dose  Unsure if she needs it-will call cardiology    Pulse Readings from Last 3 Encounters:  01/09/23 70  11/30/22 79  11/19/22 66   Lab Results  Component Value Date   NA 138 01/02/2023   K 4.2 01/02/2023   CO2 29 01/02/2023   GLUCOSE 83 01/02/2023   BUN 14 01/02/2023   CREATININE 0.94 01/02/2023   CALCIUM 9.2 01/02/2023   GFR 60.85 01/02/2023   GFRNONAA >60 08/15/2022    Hypothyroidism  Pt has no clinical changes No change in energy level/ hair or skin/ edema and no  tremor Lab Results  Component Value Date   TSH 3.18 01/02/2023    Levothyroxine 50 mcg daily   GERD Protonix 40 mg daily  Pepcid prn   Lab Results  Component Value Date   VITAMINB12 191 (L) 01/02/2023     History of anemia in the past Lab Results  Component Value Date   WBC 8.4 01/02/2023   HGB 11.0 (L) 01/02/2023   HCT 34.7 (L) 01/02/2023   MCV 88.9 01/02/2023   PLT 195.0 01/02/2023     Lab Results  Component Value Date   IRON 58 04/28/2021   TIBC 356 04/28/2021   FERRITIN 214 04/28/2021   Prediabetes Lab Results  Component Value Date   HGBA1C 6.2 01/02/2023   This  is up from 5.7  GM on both sides had dM  Hyperlipidemia Lab Results  Component Value Date   CHOL 144 01/02/2023   CHOL 342 (H) 12/28/2021   CHOL 361 (H) 12/12/2021   Lab Results  Component Value Date   HDL 61.00 01/02/2023   HDL 57.70 12/28/2021   HDL 54.70 12/12/2021   Lab Results  Component Value Date   LDLCALC 46 01/02/2023   LDLCALC 103 (H) 11/03/2013   LDLCALC 160 (H) 09/16/2013   Lab Results  Component Value Date   TRIG 185.0 (H) 01/02/2023   TRIG 299.0 (H) 12/28/2021   TRIG 295.0 (H) 12/12/2021   Lab Results  Component Value Date   CHOLHDL 2 01/02/2023   CHOLHDL 6 12/28/2021   CHOLHDL 7 12/12/2021   Lab Results  Component Value Date   LDLDIRECT 111.0 12/28/2021   LDLDIRECT 100.0 12/12/2021   LDLDIRECT 58.0 12/09/2019   Crestor 20 mg daily  LDL down to 46 -good   No red meat Eating chicken     Patient Active Problem List   Diagnosis Date Noted   Chronic diastolic heart failure (HCC) 01/09/2023   Snoring 01/09/2023   Vitamin B12 deficiency 01/09/2023   Current use of proton pump inhibitor 01/01/2023   Angioedema due to angiotensin converting enzyme inhibitor (ACE-I) 02/27/2022   SVT (supraventricular tachycardia) 02/22/2022   Urinary incontinence 12/09/2021   Pedal edema 07/26/2021   Angioedema 04/17/2021   Dizziness 08/23/2020   Fatigue 08/23/2020    Prediabetes 12/17/2019   Medicare annual wellness visit, subsequent 08/28/2018   Cough 01/28/2018   Welcome to Medicare preventive visit 08/22/2016   Obesity 10/05/2014   Anemia 09/24/2013   Palpitations 03/03/2013  Colon cancer screening 03/03/2013   Hypokalemia 10/09/2011   Routine general medical examination at a health care facility 10/02/2011   Vitamin D deficiency disease 12/28/2010   ARTHRALGIA 08/19/2007   Hypothyroidism 01/31/2007   Hyperlipidemia 01/31/2007   Anxiety state 01/31/2007   Essential hypertension 01/31/2007   GERD 01/31/2007   Past Medical History:  Diagnosis Date   Angio-edema    Anxiety    Cataract    GERD (gastroesophageal reflux disease)    Hyperlipidemia    Hypertension    Hypothyroidism    Vitamin D deficiency    Past Surgical History:  Procedure Laterality Date   CATARACT EXTRACTION W/ INTRAOCULAR LENS IMPLANT Right 12/29/2016   Dr. Mia Creek   CATARACT EXTRACTION W/ INTRAOCULAR LENS IMPLANT Left 01/15/2017   Dr. Mia Creek   COLONOSCOPY     LEEP     TUBAL LIGATION     VEIN LIGATION AND STRIPPING     x2   Social History   Tobacco Use   Smoking status: Former    Current packs/day: 0.00    Types: Cigarettes    Quit date: 04/24/1996    Years since quitting: 26.7   Smokeless tobacco: Never  Vaping Use   Vaping status: Never Used  Substance Use Topics   Alcohol use: Yes    Alcohol/week: 4.0 standard drinks of alcohol    Types: 4 Shots of liquor per week    Comment: occasional   Drug use: No   Family History  Problem Relation Age of Onset   Transient ischemic attack Mother    Hypertension Mother    Hypertension Father    Colon cancer Neg Hx    Esophageal cancer Neg Hx    Rectal cancer Neg Hx    Stomach cancer Neg Hx    Allergies  Allergen Reactions   Cozaar [Losartan] Swelling   Septra [Sulfamethoxazole-Trimethoprim] Anaphylaxis   Zinc Gelatin [Zinc] Anaphylaxis   Alpha-Gal    Amlodipine Besylate     REACTION:  edema   Atorvastatin     REACTION: muscle pain   Beef Allergy    Cat Hair Extract    Dust Mite Extract    Neomycin-Bacitracin Zn-Polymyx     REACTION: rash Per pt not sure if from meds or not   Current Outpatient Medications on File Prior to Visit  Medication Sig Dispense Refill   albuterol (VENTOLIN HFA) 108 (90 Base) MCG/ACT inhaler TAKE 2 PUFFS BY MOUTH EVERY 6 HOURS AS NEEDED FOR WHEEZE OR SHORTNESS OF BREATH 8.5 each 2   aspirin 81 MG tablet Take 81 mg by mouth daily.     benzonatate (TESSALON) 200 MG capsule Take 1 capsule (200 mg total) by mouth 3 (three) times daily as needed for cough. Swallow whole 30 capsule 1   calcium carbonate (TUMS EX) 750 MG chewable tablet Chew 2 tablets by mouth at bedtime.     Cholecalciferol (VITAMIN D) 1000 UNITS capsule Take 1,000 Units by mouth daily.     diphenhydrAMINE (BENADRYL ALLERGY) 25 MG tablet Take 1 tablet (25 mg total) by mouth every 6 (six) hours as needed. 10 tablet 0   EPINEPHrine 0.3 mg/0.3 mL IJ SOAJ injection Inject 0.3 mg into the muscle as needed for anaphylaxis. 1 each 1   ezetimibe (ZETIA) 10 MG tablet TAKE 1 TABLET BY MOUTH EVERY DAY 90 tablet 3   famotidine (PEPCID) 20 MG tablet Take 1 tablet (20 mg total) by mouth 2 (two) times daily. 8 tablet 0  fexofenadine (ALLEGRA) 180 MG tablet Take 180 mg by mouth daily.     fluticasone (FLONASE) 50 MCG/ACT nasal spray USE 2 SPRAYS IN BOTH NOSTRILS  DAILY 48 g 1   folic acid (FOLVITE) 1 MG tablet Take 1 tablet (1 mg total) by mouth daily. 30 tablet 1   hydrochlorothiazide (HYDRODIURIL) 25 MG tablet TAKE 1 TABLET (25 MG TOTAL) BY MOUTH DAILY. 90 tablet 0   levocetirizine (XYZAL) 5 MG tablet Take 5 mg by mouth daily.     levothyroxine (SYNTHROID) 50 MCG tablet TAKE 1 TABLET BY MOUTH DAILY  BEFORE BREAKFAST 90 tablet 3   metoprolol tartrate (LOPRESSOR) 25 MG tablet Take 1 tablet (25 mg total) by mouth 2 (two) times daily. 180 tablet 3   oxybutynin (DITROPAN-XL) 10 MG 24 hr tablet Take one  tablet am and pm 180 tablet 2   pantoprazole (PROTONIX) 40 MG tablet TAKE 1 TABLET BY MOUTH DAILY 90 tablet 3   rosuvastatin (CRESTOR) 20 MG tablet TAKE 1 TABLET BY MOUTH DAILY 90 tablet 0   sertraline (ZOLOFT) 100 MG tablet TAKE 1 TABLET BY MOUTH DAILY 90 tablet 3   thiamine (VITAMIN B-1) 100 MG tablet Take 1 tablet (100 mg total) by mouth daily. 30 tablet 1   No current facility-administered medications on file prior to visit.    Review of Systems  Constitutional:  Negative for activity change, appetite change, fatigue, fever and unexpected weight change.  HENT:  Negative for congestion, ear pain, rhinorrhea, sinus pressure and sore throat.   Eyes:  Negative for pain, redness and visual disturbance.  Respiratory:  Negative for cough, shortness of breath and wheezing.   Cardiovascular:  Positive for leg swelling. Negative for chest pain and palpitations.  Gastrointestinal:  Negative for abdominal pain, blood in stool, constipation and diarrhea.  Endocrine: Negative for polydipsia and polyuria.  Genitourinary:  Negative for dysuria and urgency.  Musculoskeletal:  Negative for arthralgias, back pain and myalgias.  Skin:  Negative for pallor and rash.  Allergic/Immunologic: Negative for environmental allergies.  Neurological:  Negative for dizziness, syncope and headaches.  Hematological:  Negative for adenopathy. Does not bruise/bleed easily.  Psychiatric/Behavioral:  Negative for decreased concentration and dysphoric mood. The patient is not nervous/anxious.        Objective:   Physical Exam Constitutional:      General: She is not in acute distress.    Appearance: Normal appearance. She is well-developed. She is obese. She is not ill-appearing or diaphoretic.  HENT:     Head: Normocephalic and atraumatic.     Right Ear: Tympanic membrane, ear canal and external ear normal.     Left Ear: Tympanic membrane, ear canal and external ear normal.     Nose: Nose normal. No congestion.      Mouth/Throat:     Mouth: Mucous membranes are moist.     Pharynx: Oropharynx is clear. No posterior oropharyngeal erythema.  Eyes:     General: No scleral icterus.    Extraocular Movements: Extraocular movements intact.     Conjunctiva/sclera: Conjunctivae normal.     Pupils: Pupils are equal, round, and reactive to light.  Neck:     Thyroid: No thyromegaly.     Vascular: No carotid bruit or JVD.  Cardiovascular:     Rate and Rhythm: Normal rate and regular rhythm.     Pulses: Normal pulses.     Heart sounds: Normal heart sounds.     No gallop.  Pulmonary:     Effort:  Pulmonary effort is normal. No respiratory distress.     Breath sounds: Normal breath sounds. No wheezing.     Comments: Good air exch Chest:     Chest wall: No tenderness.  Abdominal:     General: Bowel sounds are normal. There is no distension or abdominal bruit.     Palpations: Abdomen is soft. There is no mass.     Tenderness: There is no abdominal tenderness.     Hernia: No hernia is present.  Genitourinary:    Comments: Breast exam: No mass, nodules, thickening, tenderness, bulging, retraction, inflamation, nipple discharge or skin changes noted.  No axillary or clavicular LA.     Musculoskeletal:        General: No tenderness. Normal range of motion.     Cervical back: Normal range of motion and neck supple. No rigidity. No muscular tenderness.     Right lower leg: No edema.     Left lower leg: No edema.     Comments: No kyphosis   Lymphadenopathy:     Cervical: No cervical adenopathy.  Skin:    General: Skin is warm and dry.     Coloration: Skin is not pale.     Findings: No erythema or rash.     Comments: Fair  Solar lentigines diffusely  Has angioma (4 mm) on right lat thigh   Neurological:     Mental Status: She is alert. Mental status is at baseline.     Cranial Nerves: No cranial nerve deficit.     Motor: No abnormal muscle tone.     Coordination: Coordination normal.     Gait: Gait  normal.     Deep Tendon Reflexes: Reflexes are normal and symmetric. Reflexes normal.  Psychiatric:        Mood and Affect: Mood normal.        Cognition and Memory: Cognition and memory normal.           Assessment & Plan:   Problem List Items Addressed This Visit       Cardiovascular and Mediastinum   Chronic diastolic heart failure (HCC)    Pt notes some pedal edema  Sees cardiology  Taking metoprolol and hydrochlorothiazide No shortness of breath        Essential hypertension    bp is close to goal bp in fair control at this time  BP Readings from Last 1 Encounters:  01/09/23 136/82   Most recent labs reviewed  Disc lifstyle change with low sodium diet and exercise  Enc to re start metoprolol 25 mg bid that she stopped or ran out of (from cardiology) Continue hctz 25 mg daily  Hydralazine 12.5 mg tid- Will continue to follow with cardiology (also DD )       SVT (supraventricular tachycardia)     Endocrine   Hypothyroidism    Hypothyroidism  Pt has no clinical changes No change in energy level/ hair or skin/ edema and no tremor (fatigue ahs not changed) Lab Results  Component Value Date   TSH 3.18 01/02/2023    Continue levothyroxine 50 mcg daily         Other   Anemia    Chronic Normocytic  Pt declines colonoscopy  Some fatigue/unsure if related       Colon cancer screening    Pt may be interested in cologuard /not sure yet Does not want to do colonoscopy as first line screen  She plans to call and tell us if she wants  it ordered       Current use of proton pump inhibitor    B12 is low -will suppl D is low-will suppl       Hyperlipidemia    Disc goals for lipids and reasons to control them Rev last labs with pt Rev low sat fat diet in detail LDL donw to 46 with better compliance/crestor   Due for refill of zetia from her cardiologist       Prediabetes    Lab Results  Component Value Date   HGBA1C 6.2 01/02/2023   This is  up disc imp of low glycemic diet and wt loss to prevent DM2       Routine general medical examination at a health care facility - Primary    Reviewed health habits including diet and exercise and skin cancer prevention Reviewed appropriate screening tests for age  Also reviewed health mt list, fam hx and immunization status , as well as social and family history   See HPI Labs reviewed and ordered Flu shot today  Plans 2nd shingrix vaccine affer 9/22 when due  Tetanus shot due-will inq about this at pharmacy Mammogram utd 04/2022 Dexa - due for 5 y (last normal) and pt declines  No falls or fracture Discussed fall prevention, supplements and exercise for bone density  PHQ 0      Snoring   Relevant Orders   Ambulatory referral to Pulmonology   Vitamin B12 deficiency   Vitamin D deficiency disease   Other Visit Diagnoses     Need for influenza vaccination       Relevant Orders   Flu Vaccine Trivalent High Dose (Fluad) (Completed)

## 2023-01-09 NOTE — Assessment & Plan Note (Signed)
With prediabetes Discussed how this problem influences overall health and the risks it imposes  Reviewed plan for weight loss with lower calorie diet (via better food choices (lower glycemic and portion control) along with exercise building up to or more than 30 minutes 5 days per week including some aerobic activity and strength training

## 2023-01-09 NOTE — Assessment & Plan Note (Signed)
Lab Results  Component Value Date   HGBA1C 6.2 01/02/2023   This is up disc imp of low glycemic diet and wt loss to prevent DM2

## 2023-01-09 NOTE — Assessment & Plan Note (Signed)
Lab Results  Component Value Date   VITAMINB12 191 (L) 01/02/2023   Likely from ppi Shot given today Will start 1000 mcg daily over the counter as well Re check 2 months

## 2023-01-09 NOTE — Assessment & Plan Note (Signed)
Hypothyroidism  Pt has no clinical changes No change in energy level/ hair or skin/ edema and no tremor (fatigue ahs not changed) Lab Results  Component Value Date   TSH 3.18 01/02/2023    Continue levothyroxine 50 mcg daily

## 2023-01-09 NOTE — Assessment & Plan Note (Signed)
B12 is low -will suppl D is low-will suppl

## 2023-01-09 NOTE — Assessment & Plan Note (Signed)
Sees cardiology On beta blocker No recent flares

## 2023-01-09 NOTE — Assessment & Plan Note (Signed)
Chronic Normocytic  Pt declines colonoscopy  Some fatigue/unsure if related

## 2023-01-09 NOTE — Patient Instructions (Addendum)
Get 2nd shingrix when it is due - then month after that get your covid booster   Get flu shot today   You are also due to update your tetanus shot  Ask pharmacist about it this winter   If you want to do cologuard we need to know that you are open do colonoscopy if it returns positive Think about that  Call us if you want me to order it   Get on vitamin D3 2000 international units daily  Very important to bone and overall health   Call the cardiologist for prescription for generic zetia  Also ask if you should be on aspirin   Start slow with exercise  Even 5-10 minutes per day  Add some strength training to your routine, this is important for bone and brain health and can reduce your risk of falls and help your body use insulin properly and regulate weight  Light weights, exercise bands , and internet videos are a good way to start  Yoga (chair or regular), machines , floor exercises or a gym with machines are also good options     I put the referral in to discuss possible sleep apnea with a pulmonary doctor  Please let us know if you don't hear in 1-2 weeks   You have vitamin B12 deficiency  Shot today  Then get vitamin B12 over the counter and take 1000 mcg every day  We will re check level in about 2 months  This should help energy level some   For prediabetes Try to get most of your carbohydrates from produce (with the exception of white potatoes) and whole grains Eat less bread/pasta/rice/snack foods/cereals/sweets and other items from the middle of the grocery store (processed carbs)

## 2023-01-09 NOTE — Assessment & Plan Note (Signed)
Last episode ER was early aug  None since

## 2023-01-15 ENCOUNTER — Ambulatory Visit: Payer: Medicare Other | Admitting: Physician Assistant

## 2023-01-15 DIAGNOSIS — N390 Urinary tract infection, site not specified: Secondary | ICD-10-CM | POA: Diagnosis not present

## 2023-01-15 DIAGNOSIS — N3946 Mixed incontinence: Secondary | ICD-10-CM | POA: Diagnosis not present

## 2023-01-15 DIAGNOSIS — R300 Dysuria: Secondary | ICD-10-CM | POA: Diagnosis not present

## 2023-01-15 LAB — URINALYSIS, COMPLETE
Bilirubin, UA: NEGATIVE
Glucose, UA: NEGATIVE
Ketones, UA: NEGATIVE
Nitrite, UA: NEGATIVE
Protein,UA: NEGATIVE
RBC, UA: NEGATIVE
Specific Gravity, UA: 1.02 (ref 1.005–1.030)
Urobilinogen, Ur: 0.2 mg/dL (ref 0.2–1.0)
pH, UA: 6 (ref 5.0–7.5)

## 2023-01-15 LAB — MICROSCOPIC EXAMINATION: Epithelial Cells (non renal): >10 /hpf — AB (ref 0–10)

## 2023-01-15 NOTE — Progress Notes (Signed)
PTNS  Session # 15  Health & Social Factors: No change Caffeine: 2 Alcohol: 0 Daytime voids #per day: 5 Night-time voids #per night: 3-4 Urgency: severe Incontinence Episodes #per day: 0-2 Ankle used: right Treatment Setting: 2 Feeling/ Response: sensory Comments: Patient tolerated well.  Performed By: Carman Ching, PA-C   Additional notes: She reports intermittent dysuria x4 days. Will leave UA today and call with results.  She would like to try Botox.  Follow Up: Return in about 4 weeks (around 02/12/2023) for PTNS maintenance, Will call with results.

## 2023-01-18 LAB — CULTURE, URINE COMPREHENSIVE

## 2023-01-23 DIAGNOSIS — Z930 Tracheostomy status: Secondary | ICD-10-CM

## 2023-01-23 HISTORY — DX: Tracheostomy status: Z93.0

## 2023-01-23 HISTORY — PX: PEG TUBE PLACEMENT: SUR1034

## 2023-02-01 ENCOUNTER — Inpatient Hospital Stay
Admission: EM | Admit: 2023-02-01 | Discharge: 2023-02-09 | DRG: 004 | Disposition: A | Discharge status: Other Acute Inpatient Hospital | Payer: Medicare Other | Attending: Internal Medicine | Admitting: Internal Medicine

## 2023-02-01 ENCOUNTER — Inpatient Hospital Stay: Payer: Medicare Other | Admitting: Anesthesiology

## 2023-02-01 ENCOUNTER — Inpatient Hospital Stay: Payer: Medicare Other

## 2023-02-01 ENCOUNTER — Other Ambulatory Visit: Payer: Self-pay

## 2023-02-01 ENCOUNTER — Encounter
Admission: EM | Disposition: A | Discharge status: Nursing Home (Includes ICF) | Payer: Self-pay | Source: Home / Self Care | Attending: Internal Medicine

## 2023-02-01 DIAGNOSIS — J9601 Acute respiratory failure with hypoxia: Secondary | ICD-10-CM | POA: Diagnosis not present

## 2023-02-01 DIAGNOSIS — J9602 Acute respiratory failure with hypercapnia: Secondary | ICD-10-CM | POA: Diagnosis not present

## 2023-02-01 DIAGNOSIS — Z6834 Body mass index (BMI) 34.0-34.9, adult: Secondary | ICD-10-CM | POA: Diagnosis not present

## 2023-02-01 DIAGNOSIS — R1312 Dysphagia, oropharyngeal phase: Secondary | ICD-10-CM | POA: Diagnosis not present

## 2023-02-01 DIAGNOSIS — Z888 Allergy status to other drugs, medicaments and biological substances status: Secondary | ICD-10-CM

## 2023-02-01 DIAGNOSIS — T783XXD Angioneurotic edema, subsequent encounter: Secondary | ICD-10-CM | POA: Diagnosis not present

## 2023-02-01 DIAGNOSIS — E669 Obesity, unspecified: Secondary | ICD-10-CM | POA: Diagnosis present

## 2023-02-01 DIAGNOSIS — Z4682 Encounter for fitting and adjustment of non-vascular catheter: Secondary | ICD-10-CM | POA: Diagnosis not present

## 2023-02-01 DIAGNOSIS — I10 Essential (primary) hypertension: Secondary | ICD-10-CM | POA: Diagnosis not present

## 2023-02-01 DIAGNOSIS — Z9911 Dependence on respirator [ventilator] status: Secondary | ICD-10-CM | POA: Diagnosis not present

## 2023-02-01 DIAGNOSIS — Z881 Allergy status to other antibiotic agents status: Secondary | ICD-10-CM | POA: Diagnosis not present

## 2023-02-01 DIAGNOSIS — Z883 Allergy status to other anti-infective agents status: Secondary | ICD-10-CM | POA: Diagnosis not present

## 2023-02-01 DIAGNOSIS — Z8249 Family history of ischemic heart disease and other diseases of the circulatory system: Secondary | ICD-10-CM

## 2023-02-01 DIAGNOSIS — Z930 Tracheostomy status: Secondary | ICD-10-CM | POA: Diagnosis not present

## 2023-02-01 DIAGNOSIS — Z87891 Personal history of nicotine dependence: Secondary | ICD-10-CM | POA: Diagnosis not present

## 2023-02-01 DIAGNOSIS — Z91014 Allergy to mammalian meats: Secondary | ICD-10-CM

## 2023-02-01 DIAGNOSIS — J9621 Acute and chronic respiratory failure with hypoxia: Secondary | ICD-10-CM | POA: Diagnosis not present

## 2023-02-01 DIAGNOSIS — Z79899 Other long term (current) drug therapy: Secondary | ICD-10-CM | POA: Diagnosis not present

## 2023-02-01 DIAGNOSIS — G9389 Other specified disorders of brain: Secondary | ICD-10-CM | POA: Diagnosis not present

## 2023-02-01 DIAGNOSIS — R000 Tachycardia, unspecified: Secondary | ICD-10-CM | POA: Diagnosis not present

## 2023-02-01 DIAGNOSIS — I6782 Cerebral ischemia: Secondary | ICD-10-CM | POA: Diagnosis not present

## 2023-02-01 DIAGNOSIS — E785 Hyperlipidemia, unspecified: Secondary | ICD-10-CM | POA: Diagnosis not present

## 2023-02-01 DIAGNOSIS — R918 Other nonspecific abnormal finding of lung field: Secondary | ICD-10-CM | POA: Diagnosis not present

## 2023-02-01 DIAGNOSIS — J984 Other disorders of lung: Secondary | ICD-10-CM | POA: Diagnosis not present

## 2023-02-01 DIAGNOSIS — D62 Acute posthemorrhagic anemia: Secondary | ICD-10-CM | POA: Diagnosis not present

## 2023-02-01 DIAGNOSIS — J384 Edema of larynx: Secondary | ICD-10-CM | POA: Diagnosis not present

## 2023-02-01 DIAGNOSIS — E039 Hypothyroidism, unspecified: Secondary | ICD-10-CM | POA: Diagnosis present

## 2023-02-01 DIAGNOSIS — K219 Gastro-esophageal reflux disease without esophagitis: Secondary | ICD-10-CM | POA: Diagnosis present

## 2023-02-01 DIAGNOSIS — X58XXXA Exposure to other specified factors, initial encounter: Secondary | ICD-10-CM | POA: Diagnosis present

## 2023-02-01 DIAGNOSIS — Z79890 Hormone replacement therapy (postmenopausal): Secondary | ICD-10-CM | POA: Diagnosis not present

## 2023-02-01 DIAGNOSIS — I517 Cardiomegaly: Secondary | ICD-10-CM | POA: Diagnosis not present

## 2023-02-01 DIAGNOSIS — N179 Acute kidney failure, unspecified: Secondary | ICD-10-CM | POA: Diagnosis not present

## 2023-02-01 DIAGNOSIS — J90 Pleural effusion, not elsewhere classified: Secondary | ICD-10-CM | POA: Diagnosis not present

## 2023-02-01 DIAGNOSIS — R29818 Other symptoms and signs involving the nervous system: Secondary | ICD-10-CM | POA: Diagnosis not present

## 2023-02-01 DIAGNOSIS — T783XXA Angioneurotic edema, initial encounter: Principal | ICD-10-CM | POA: Diagnosis present

## 2023-02-01 DIAGNOSIS — R001 Bradycardia, unspecified: Secondary | ICD-10-CM | POA: Diagnosis not present

## 2023-02-01 DIAGNOSIS — Z961 Presence of intraocular lens: Secondary | ICD-10-CM | POA: Diagnosis present

## 2023-02-01 DIAGNOSIS — F419 Anxiety disorder, unspecified: Secondary | ICD-10-CM | POA: Diagnosis present

## 2023-02-01 DIAGNOSIS — Z7982 Long term (current) use of aspirin: Secondary | ICD-10-CM

## 2023-02-01 DIAGNOSIS — I110 Hypertensive heart disease with heart failure: Secondary | ICD-10-CM | POA: Diagnosis not present

## 2023-02-01 DIAGNOSIS — J9811 Atelectasis: Secondary | ICD-10-CM | POA: Diagnosis not present

## 2023-02-01 DIAGNOSIS — T783XXS Angioneurotic edema, sequela: Secondary | ICD-10-CM | POA: Diagnosis not present

## 2023-02-01 DIAGNOSIS — R0989 Other specified symptoms and signs involving the circulatory and respiratory systems: Secondary | ICD-10-CM | POA: Diagnosis not present

## 2023-02-01 DIAGNOSIS — I5032 Chronic diastolic (congestive) heart failure: Secondary | ICD-10-CM | POA: Diagnosis not present

## 2023-02-01 HISTORY — PX: INTUBATION-ENDOTRACHEAL WITH TRACHEOSTOMY STANDBY: SHX6592

## 2023-02-01 LAB — CBC WITH DIFFERENTIAL/PLATELET
Abs Immature Granulocytes: 0.03 10*3/uL (ref 0.00–0.07)
Basophils Absolute: 0 10*3/uL (ref 0.0–0.1)
Basophils Relative: 0 %
Eosinophils Absolute: 0.2 10*3/uL (ref 0.0–0.5)
Eosinophils Relative: 3 %
HCT: 34 % — ABNORMAL LOW (ref 36.0–46.0)
Hemoglobin: 11 g/dL — ABNORMAL LOW (ref 12.0–15.0)
Immature Granulocytes: 0 %
Lymphocytes Relative: 28 %
Lymphs Abs: 2.6 10*3/uL (ref 0.7–4.0)
MCH: 29.1 pg (ref 26.0–34.0)
MCHC: 32.4 g/dL (ref 30.0–36.0)
MCV: 89.9 fL (ref 80.0–100.0)
Monocytes Absolute: 0.6 10*3/uL (ref 0.1–1.0)
Monocytes Relative: 7 %
Neutro Abs: 5.7 10*3/uL (ref 1.7–7.7)
Neutrophils Relative %: 62 %
Platelets: 278 10*3/uL (ref 150–400)
RBC: 3.78 MIL/uL — ABNORMAL LOW (ref 3.87–5.11)
RDW: 14.4 % (ref 11.5–15.5)
WBC: 9.1 10*3/uL (ref 4.0–10.5)
nRBC: 0.3 % — ABNORMAL HIGH (ref 0.0–0.2)

## 2023-02-01 LAB — BLOOD GAS, ARTERIAL
Acid-Base Excess: 1.5 mmol/L (ref 0.0–2.0)
Bicarbonate: 27.2 mmol/L (ref 20.0–28.0)
FIO2: 40 %
MECHVT: 450 mL
Mechanical Rate: 16
O2 Saturation: 98.8 %
PEEP: 5 cmH2O
Patient temperature: 37
pCO2 arterial: 46 mm[Hg] (ref 32–48)
pH, Arterial: 7.38 (ref 7.35–7.45)
pO2, Arterial: 108 mm[Hg] (ref 83–108)

## 2023-02-01 LAB — MAGNESIUM: Magnesium: 1.7 mg/dL (ref 1.7–2.4)

## 2023-02-01 LAB — COMPREHENSIVE METABOLIC PANEL
ALT: 11 U/L (ref 0–44)
AST: 23 U/L (ref 15–41)
Albumin: 4.1 g/dL (ref 3.5–5.0)
Alkaline Phosphatase: 71 U/L (ref 38–126)
Anion gap: 11 (ref 5–15)
BUN: 20 mg/dL (ref 8–23)
CO2: 25 mmol/L (ref 22–32)
Calcium: 9.4 mg/dL (ref 8.9–10.3)
Chloride: 97 mmol/L — ABNORMAL LOW (ref 98–111)
Creatinine, Ser: 1 mg/dL (ref 0.44–1.00)
GFR, Estimated: 60 mL/min — ABNORMAL LOW (ref >60)
Glucose, Bld: 130 mg/dL — ABNORMAL HIGH (ref 70–99)
Potassium: 3.1 mmol/L — ABNORMAL LOW (ref 3.5–5.1)
Sodium: 133 mmol/L — ABNORMAL LOW (ref 135–145)
Total Bilirubin: 0.9 mg/dL (ref 0.3–1.2)
Total Protein: 7.6 g/dL (ref 6.5–8.1)

## 2023-02-01 LAB — GLUCOSE, CAPILLARY
Glucose-Capillary: 154 mg/dL — ABNORMAL HIGH (ref 70–99)
Glucose-Capillary: 152 mg/dL — ABNORMAL HIGH (ref 70–99)
Glucose-Capillary: 151 mg/dL — ABNORMAL HIGH (ref 70–99)

## 2023-02-01 LAB — MRSA NEXT GEN BY PCR, NASAL: MRSA by PCR Next Gen: NOT DETECTED

## 2023-02-01 SURGERY — INTUBATION-ENDOTRACHEAL WITH TRACHEOSTOMY STANDBY
Anesthesia: General

## 2023-02-01 MED ORDER — LIDOCAINE HCL 4 % EX SOLN
Freq: Once | CUTANEOUS | Status: DC
  Filled 2023-02-01: qty 50

## 2023-02-01 MED ORDER — METHYLPREDNISOLONE SODIUM SUCC 125 MG IJ SOLR
125.0000 mg | Freq: Once | INTRAMUSCULAR | Status: AC
  Administered 2023-02-01: 125 mg via INTRAVENOUS
  Filled 2023-02-01: qty 2

## 2023-02-01 MED ORDER — ORAL CARE MOUTH RINSE
15.0000 mL | OROMUCOSAL | Status: DC
  Administered 2023-02-01 – 2023-02-09 (×93): 15 mL via OROMUCOSAL

## 2023-02-01 MED ORDER — ACETAMINOPHEN 325 MG PO TABS
650.0000 mg | ORAL_TABLET | ORAL | Status: DC | PRN
  Administered 2023-02-07: 650 mg via ORAL
  Filled 2023-02-01: qty 2

## 2023-02-01 MED ORDER — OXYMETAZOLINE HCL 0.05 % NA SOLN
2.0000 | Freq: Once | NASAL | Status: AC
  Administered 2023-02-01: 2 via NASAL
  Filled 2023-02-01: qty 30

## 2023-02-01 MED ORDER — SODIUM CHLORIDE 0.9 % IV SOLN: INTRAVENOUS | Status: DC

## 2023-02-01 MED ORDER — GLYCOPYRROLATE 0.2 MG/ML IJ SOLN
INTRAMUSCULAR | Status: DC | PRN
  Administered 2023-02-01: .2 mg via INTRAVENOUS

## 2023-02-01 MED ORDER — C1 ESTERASE INHIBITOR (HUMAN) 500 UNITS IV KIT
2000.0000 [IU] | PACK | Freq: Once | INTRAVENOUS | Status: AC
  Administered 2023-02-01: 2000 [IU] via INTRAVENOUS
  Filled 2023-02-01: qty 2000

## 2023-02-01 MED ORDER — MIDAZOLAM HCL 2 MG/2ML IJ SOLN
INTRAMUSCULAR | Status: DC | PRN
  Administered 2023-02-01: 2 mg via INTRAVENOUS

## 2023-02-01 MED ORDER — EPINEPHRINE 0.3 MG/0.3ML IJ SOAJ
INTRAMUSCULAR | Status: AC
  Administered 2023-02-01: 0.3 mg
  Filled 2023-02-01: qty 0.3

## 2023-02-01 MED ORDER — SODIUM CHLORIDE 0.9% FLUSH: 3.0000 mL | INTRAVENOUS | Status: DC | PRN

## 2023-02-01 MED ORDER — MIDAZOLAM HCL 2 MG/2ML IJ SOLN
2.0000 mg | INTRAMUSCULAR | Status: DC | PRN
  Administered 2023-02-01 – 2023-02-03 (×3): 2 mg via INTRAVENOUS
  Administered 2023-02-04 (×2): 4 mg via INTRAVENOUS
  Administered 2023-02-04: 2 mg via INTRAVENOUS
  Administered 2023-02-05 – 2023-02-06 (×3): 4 mg via INTRAVENOUS
  Administered 2023-02-06: 2 mg via INTRAVENOUS
  Filled 2023-02-01 (×2): qty 2
  Filled 2023-02-01 (×2): qty 4
  Filled 2023-02-01: qty 2
  Filled 2023-02-01 (×3): qty 4
  Filled 2023-02-01 (×2): qty 2
  Filled 2023-02-01: qty 4
  Filled 2023-02-01: qty 2

## 2023-02-01 MED ORDER — LIDOCAINE VISCOUS HCL 2 % MT SOLN
15.0000 mL | Freq: Once | OROMUCOSAL | Status: DC
  Filled 2023-02-01: qty 15

## 2023-02-01 MED ORDER — SODIUM CHLORIDE 0.9 % IV SOLN
250.0000 mL | INTRAVENOUS | Status: DC
  Administered 2023-02-01: 250 mL via INTRAVENOUS

## 2023-02-01 MED ORDER — PROPOFOL 1000 MG/100ML IV EMUL
5.0000 ug/kg/min | INTRAVENOUS | Status: DC
  Administered 2023-02-01 (×2): 45 ug/kg/min via INTRAVENOUS
  Administered 2023-02-01: 70 ug/kg/min via INTRAVENOUS
  Administered 2023-02-02: 45 ug/kg/min via INTRAVENOUS
  Administered 2023-02-02: 40 ug/kg/min via INTRAVENOUS
  Administered 2023-02-02 (×2): 45 ug/kg/min via INTRAVENOUS
  Administered 2023-02-02: 40 ug/kg/min via INTRAVENOUS
  Administered 2023-02-02: 45 ug/kg/min via INTRAVENOUS
  Administered 2023-02-03: 60 ug/kg/min via INTRAVENOUS
  Administered 2023-02-03 (×3): 40 ug/kg/min via INTRAVENOUS
  Administered 2023-02-03: 45 ug/kg/min via INTRAVENOUS
  Administered 2023-02-03: 40 ug/kg/min via INTRAVENOUS
  Administered 2023-02-03 – 2023-02-04 (×2): 50 ug/kg/min via INTRAVENOUS
  Administered 2023-02-04: 45 ug/kg/min via INTRAVENOUS
  Administered 2023-02-04: 50 ug/kg/min via INTRAVENOUS
  Administered 2023-02-04: 60 ug/kg/min via INTRAVENOUS
  Administered 2023-02-04: 45 ug/kg/min via INTRAVENOUS
  Administered 2023-02-04: 55 ug/kg/min via INTRAVENOUS
  Administered 2023-02-04: 45 ug/kg/min via INTRAVENOUS
  Administered 2023-02-05 (×5): 50 ug/kg/min via INTRAVENOUS
  Administered 2023-02-05: 45 ug/kg/min via INTRAVENOUS
  Administered 2023-02-05: 55 ug/kg/min via INTRAVENOUS
  Administered 2023-02-06 (×2): 60 ug/kg/min via INTRAVENOUS
  Administered 2023-02-06: 50 ug/kg/min via INTRAVENOUS
  Administered 2023-02-06: 60 ug/kg/min via INTRAVENOUS
  Administered 2023-02-06: 50 ug/kg/min via INTRAVENOUS
  Administered 2023-02-06: 55 ug/kg/min via INTRAVENOUS
  Administered 2023-02-06: 60 ug/kg/min via INTRAVENOUS
  Administered 2023-02-06: 50 ug/kg/min via INTRAVENOUS
  Administered 2023-02-07 (×2): 40 ug/kg/min via INTRAVENOUS
  Filled 2023-02-01 (×40): qty 100

## 2023-02-01 MED ORDER — PROPOFOL 10 MG/ML IV BOLUS
INTRAVENOUS | Status: DC | PRN
  Administered 2023-02-01: 100 mg via INTRAVENOUS
  Administered 2023-02-01 (×2): 50 mg via INTRAVENOUS

## 2023-02-01 MED ORDER — MIDAZOLAM HCL 2 MG/2ML IJ SOLN
INTRAMUSCULAR | Status: AC
  Filled 2023-02-01: qty 2

## 2023-02-01 MED ORDER — TRANEXAMIC ACID-NACL 1000-0.7 MG/100ML-% IV SOLN
1000.0000 mg | Freq: Once | INTRAVENOUS | Status: AC
  Administered 2023-02-01: 1000 mg via INTRAVENOUS
  Filled 2023-02-01: qty 100

## 2023-02-01 MED ORDER — FENTANYL BOLUS VIA INFUSION
25.0000 ug | INTRAVENOUS | Status: DC | PRN
  Administered 2023-02-01: 100 ug via INTRAVENOUS
  Administered 2023-02-01: 50 ug via INTRAVENOUS
  Administered 2023-02-03: 25 ug via INTRAVENOUS
  Administered 2023-02-03: 50 ug via INTRAVENOUS
  Administered 2023-02-04 – 2023-02-06 (×3): 100 ug via INTRAVENOUS

## 2023-02-01 MED ORDER — SODIUM CHLORIDE 0.9% FLUSH
3.0000 mL | Freq: Two times a day (BID) | INTRAVENOUS | Status: DC
  Administered 2023-02-01 – 2023-02-07 (×13): 3 mL via INTRAVENOUS
  Administered 2023-02-07: 1 mL via INTRAVENOUS
  Administered 2023-02-08 – 2023-02-09 (×3): 3 mL via INTRAVENOUS

## 2023-02-01 MED ORDER — ESMOLOL HCL 100 MG/10ML IV SOLN
INTRAVENOUS | Status: DC | PRN
Start: 2023-02-01 — End: 2023-02-01
  Administered 2023-02-01: 30 mg via INTRAVENOUS

## 2023-02-01 MED ORDER — KETAMINE HCL 50 MG/5ML IJ SOSY
PREFILLED_SYRINGE | INTRAMUSCULAR | Status: AC
  Filled 2023-02-01: qty 5

## 2023-02-01 MED ORDER — NOREPINEPHRINE 4 MG/250ML-% IV SOLN
2.0000 ug/min | INTRAVENOUS | Status: DC
  Filled 2023-02-01: qty 250

## 2023-02-01 MED ORDER — DOCUSATE SODIUM 50 MG/5ML PO LIQD
100.0000 mg | Freq: Two times a day (BID) | ORAL | Status: DC
  Administered 2023-02-06 – 2023-02-09 (×6): 100 mg
  Filled 2023-02-01 (×5): qty 10

## 2023-02-01 MED ORDER — POTASSIUM CHLORIDE 10 MEQ/50ML IV SOLN: 10.0000 meq | INTRAVENOUS | Status: DC

## 2023-02-01 MED ORDER — DIPHENHYDRAMINE HCL 50 MG/ML IJ SOLN
50.0000 mg | Freq: Once | INTRAMUSCULAR | Status: AC
  Administered 2023-02-01: 50 mg via INTRAVENOUS
  Filled 2023-02-01: qty 1

## 2023-02-01 MED ORDER — FENTANYL 2500MCG IN NS 250ML (10MCG/ML) PREMIX INFUSION
0.0000 ug/h | INTRAVENOUS | Status: DC
  Administered 2023-02-01: 25 ug/h via INTRAVENOUS
  Administered 2023-02-02 – 2023-02-03 (×3): 175 ug/h via INTRAVENOUS
  Administered 2023-02-04 – 2023-02-06 (×6): 200 ug/h via INTRAVENOUS
  Administered 2023-02-07: 100 ug/h via INTRAVENOUS
  Filled 2023-02-01 (×11): qty 250

## 2023-02-01 MED ORDER — POLYETHYLENE GLYCOL 3350 17 G PO PACK
17.0000 g | PACK | Freq: Every day | ORAL | Status: DC
  Administered 2023-02-07 – 2023-02-09 (×3): 17 g
  Filled 2023-02-01 (×3): qty 1

## 2023-02-01 MED ORDER — ORAL CARE MOUTH RINSE: 15.0000 mL | OROMUCOSAL | Status: DC | PRN

## 2023-02-01 MED ORDER — DEXAMETHASONE SODIUM PHOSPHATE 10 MG/ML IJ SOLN
INTRAMUSCULAR | Status: DC | PRN
  Administered 2023-02-01: 10 mg via INTRAVENOUS

## 2023-02-01 MED ORDER — DOCUSATE SODIUM 100 MG PO CAPS: 100.0000 mg | ORAL_CAPSULE | Freq: Two times a day (BID) | ORAL | Status: DC | PRN

## 2023-02-01 MED ORDER — FAMOTIDINE IN NACL 20-0.9 MG/50ML-% IV SOLN
20.0000 mg | Freq: Two times a day (BID) | INTRAVENOUS | Status: DC
  Administered 2023-02-01 – 2023-02-03 (×5): 20 mg via INTRAVENOUS
  Filled 2023-02-01 (×5): qty 50

## 2023-02-01 MED ORDER — POLYETHYLENE GLYCOL 3350 17 G PO PACK: 17.0000 g | PACK | Freq: Every day | ORAL | Status: DC | PRN

## 2023-02-01 MED ORDER — SODIUM CHLORIDE 0.9 % IV SOLN: 250.0000 mL | INTRAVENOUS | Status: DC | PRN

## 2023-02-01 MED ORDER — DEXMEDETOMIDINE HCL IN NACL 80 MCG/20ML IV SOLN
INTRAVENOUS | Status: DC | PRN
Start: 2023-02-01 — End: 2023-02-01
  Administered 2023-02-01: 12 ug via INTRAVENOUS
  Administered 2023-02-01: 16 ug via INTRAVENOUS
  Administered 2023-02-01: 8 ug via INTRAVENOUS

## 2023-02-01 MED ORDER — KETAMINE HCL 10 MG/ML IJ SOLN
INTRAMUSCULAR | Status: DC | PRN
Start: 2023-02-01 — End: 2023-02-01
  Administered 2023-02-01: 20 mg via INTRAVENOUS

## 2023-02-01 MED ORDER — PROPOFOL 500 MG/50ML IV EMUL
INTRAVENOUS | Status: DC | PRN
Start: 2023-02-01 — End: 2023-02-01
  Administered 2023-02-01: 30 ug/kg/min via INTRAVENOUS

## 2023-02-01 MED ORDER — FENTANYL CITRATE PF 50 MCG/ML IJ SOSY: 25.0000 ug | PREFILLED_SYRINGE | Freq: Once | INTRAMUSCULAR | Status: DC

## 2023-02-01 MED ORDER — POTASSIUM CHLORIDE 10 MEQ/100ML IV SOLN
10.0000 meq | INTRAVENOUS | Status: AC
  Administered 2023-02-01 (×4): 10 meq via INTRAVENOUS
  Filled 2023-02-01 (×4): qty 100

## 2023-02-01 MED ORDER — ROCURONIUM BROMIDE 100 MG/10ML IV SOLN
INTRAVENOUS | Status: DC | PRN
  Administered 2023-02-01: 50 mg via INTRAVENOUS

## 2023-02-01 MED ORDER — FAMOTIDINE IN NACL 20-0.9 MG/50ML-% IV SOLN
20.0000 mg | Freq: Once | INTRAVENOUS | Status: AC
  Administered 2023-02-01: 20 mg via INTRAVENOUS
  Filled 2023-02-01: qty 50

## 2023-02-01 MED ORDER — ONDANSETRON HCL 4 MG/2ML IJ SOLN
4.0000 mg | Freq: Four times a day (QID) | INTRAMUSCULAR | Status: DC | PRN
  Administered 2023-02-08: 4 mg via INTRAVENOUS
  Filled 2023-02-01: qty 2

## 2023-02-01 MED ORDER — PROPOFOL 1000 MG/100ML IV EMUL
INTRAVENOUS | Status: AC
  Filled 2023-02-01: qty 100

## 2023-02-01 MED ORDER — FAMOTIDINE 20 MG PO TABS: 20.0000 mg | ORAL_TABLET | Freq: Two times a day (BID) | ORAL | Status: DC

## 2023-02-01 MED ORDER — DEXAMETHASONE SODIUM PHOSPHATE 4 MG/ML IJ SOLN
4.0000 mg | Freq: Four times a day (QID) | INTRAMUSCULAR | Status: DC
  Administered 2023-02-01 – 2023-02-06 (×19): 4 mg via INTRAVENOUS
  Filled 2023-02-01 (×19): qty 1

## 2023-02-01 SURGICAL SUPPLY — 30 items
BLADE SURG 15 STRL LF DISP TIS (BLADE) ×2 IMPLANT
BLADE SURG 15 STRL SS (BLADE) ×1
BLADE SURG SZ11 CARB STEEL (BLADE) ×2 IMPLANT
BRONCHOSCOPE BIFLEX REG 5.0 (MISCELLANEOUS) IMPLANT
ELECT REM PT RETURN 9FT ADLT (ELECTROSURGICAL) ×1
ELECTRODE REM PT RTRN 9FT ADLT (ELECTROSURGICAL) ×2 IMPLANT
GAUZE 4X4 16PLY ~~LOC~~+RFID DBL (SPONGE) ×2 IMPLANT
GLOVE BIO SURGEON STRL SZ7.5 (GLOVE) ×2 IMPLANT
GOWN STRL REUS W/ TWL LRG LVL3 (GOWN DISPOSABLE) ×4 IMPLANT
GOWN STRL REUS W/TWL LRG LVL3 (GOWN DISPOSABLE) ×2
GlideScope BFLEX 3.8 IMPLANT
HLDR TRACH TUBE NECKBAND 18 (MISCELLANEOUS) IMPLANT
KIT TURNOVER KIT A (KITS) ×2 IMPLANT
MANIFOLD NEPTUNE II (INSTRUMENTS) ×2 IMPLANT
PACK HEAD/NECK (MISCELLANEOUS) ×2 IMPLANT
SCOPE INTUBATION FLEX 5.3X65 (INSTRUMENTS) IMPLANT
SHEARS HARMONIC 9CM CVD (BLADE) IMPLANT
SPONGE DRAIN TRACH 4X4 STRL 2S (GAUZE/BANDAGES/DRESSINGS) ×2 IMPLANT
SPONGE KITTNER 5P (MISCELLANEOUS) IMPLANT
SUT ETHILON 2 0 FS 18 (SUTURE) IMPLANT
SUT SILK 2 0 (SUTURE)
SUT SILK 2 0 SH (SUTURE) IMPLANT
SUT SILK 2-0 18XBRD TIE 12 (SUTURE) IMPLANT
SUT VIC AB 3-0 PS2 18 (SUTURE)
SUT VIC AB 3-0 PS2 18XBRD (SUTURE) IMPLANT
Storz Endoscope IMPLANT
TRAP FLUID SMOKE EVACUATOR (MISCELLANEOUS) ×2 IMPLANT
TUBE TRACH 6.0 CUFF FLEX (MISCELLANEOUS) IMPLANT
TUBE TRACH FLEX 8.0 CUFF (MISCELLANEOUS)
TUBE TRACH FLEX 8.5 CUFF (MISCELLANEOUS) IMPLANT

## 2023-02-01 NOTE — Op Note (Signed)
02/01/2023  1:24 PM    Sharon [name]  [id number]   Pre-Op Dx: angioedema  Post-op Dx: SAME  Proc: Bronchoscopy with transnasal endotracheal intubation  Surg:  Davina Poke   Assistant: Vought  Anes:  GOT  EBL: 0  Comp: None  Findings: Significant angioedema of tongue floor mouth epiglottis and arytenoids.  Procedure: Benna Dunks was taken emergently from the emergency room into the operating.  There was significant angioedema of the tongue floor mouth and larynx.  She previously been decongested with Afrin.  A topical anesthetic of lidocaine viscous lidocaine was used to anesthetize the right nostril.  Approximately 5 minutes the nasal trumpet with lidocaine solution was removed.  A flexible fiberoptic disposable bronchoscope with a #6.5 endotracheal tube on the outside was advanced through the right nostril into the laryngeal area.  The right significant edema of the arytenoids and of the epiglottis.  The vocal folds appear to be relatively normal.  The bronchoscope was introduced through the larynx into the subglottis and down to the carina.  The endotracheal tube was then advanced over the bronchoscope into the airway.  The bronchoscope was removed.  The endotracheal tube was in excellent position and there was good return of CO2 and the airway was managed.  The patient was then sedated.  The tube was secured.  The patient will be transported to the ICU.  Dispo:   Good  Plan: Patient will be in the ICU over the weekend.  With this history of recurrent angioedema with multiple visits to the hospital once requiring intubation my recommendation would be a tracheostomy tube.  We will keep her sedated let this angioedema decrease over the weekend and plan tracheostomy tube placement next week if her husband agrees.  Davina Poke  02/01/2023 1:24 PM

## 2023-02-01 NOTE — ED Notes (Addendum)
ED provider at bedside. Husband gave epi pen around 1140 today but was unsure if it went off.

## 2023-02-01 NOTE — ED Notes (Signed)
Pharmacy called about missing dose Berinert and requested the lidocaine solution.

## 2023-02-01 NOTE — ED Notes (Signed)
ENT at bedside

## 2023-02-01 NOTE — Progress Notes (Signed)
Patient ST, on ventilator, afebrile with stable pressors. RAAS goal -3/-4 per NP. Medications titrated per verbal NP orders to reach sedation goals.   Spouse at bedside, updated.   MD notified of K levels. New orders placed.  Patient SB, with stable pressors. MD made aware. No new orders.

## 2023-02-01 NOTE — Anesthesia Procedure Notes (Signed)
Procedure Name: Awake intubation Date/Time: 02/01/2023 1:14 PM  Performed by: Katherine Basset, CRNAPre-anesthesia Checklist: Patient identified, Emergency Drugs available, Suction available and Patient being monitored Patient Re-evaluated:Patient Re-evaluated prior to induction Oxygen Delivery Method: Circle system utilized Preoxygenation: Pre-oxygenation with 100% oxygen Induction Type: IV induction Nasal Tubes: Right and Nasal prep performed Tube size: 6.5 mm Number of attempts: 1 Airway Equipment and Method: Fiberoptic brochoscope Placement Confirmation: ETT inserted through vocal cords under direct vision, positive ETCO2 and breath sounds checked- equal and bilateral Secured at: 27 cm Tube secured with: Tape Dental Injury: Teeth and Oropharynx as per pre-operative assessment  Difficulty Due To: Difficulty was anticipated, Difficult Airway- due to large tongue and Difficult Airway-  due to edematous airway Comments: Pt w/angioedema, fiberoptic intubation performed by Dr. Sibyl Parr at bedside w/o difficulty

## 2023-02-01 NOTE — Transfer of Care (Signed)
Immediate Anesthesia Transfer of Care Note  Patient: Sharon [name]  Procedure(s) Performed: INTUBATION-ENDOTRACHEAL WITH TRACHEOSTOMY STANDBY  Patient Location: ICU  Anesthesia Type:General  Level of Consciousness: sedated and Patient remains intubated per anesthesia plan  Airway & Oxygen Therapy: Patient remains intubated per anesthesia plan  Post-op Assessment: Report given to RN and Post -op Vital signs reviewed and stable  Post vital signs: Reviewed and stable  Last Vitals:  Vitals Value Taken Time  BP 112/63 02/01/23 1341  Temp    Pulse 106 02/01/23 1341  Resp 16 02/01/23 1342  SpO2 99 % 02/01/23 1341  Vitals shown include unfiled device data.  Last Pain: There were no vitals filed for this visit.       Complications:  Encounter Notable Events  Notable Event Outcome Phase Comment  Difficult to intubate - expected  Intraprocedure Filed from anesthesia note documentation.

## 2023-02-01 NOTE — ED Triage Notes (Signed)
First nurse note: pt with facial and tongue swelling. Unable to speak clearly. Husband reports takes bp meds. Swelling started at 1030 this am. Taken back to rm 8 immediately. Dr Larinda Buttery to bedside. Charge RN aware

## 2023-02-01 NOTE — Consult Note (Signed)
Sharon [name], Sharon [name] [id number] Aug 31, 1950 Erin Fulling, MD  Reason for Consult: Angioedema  HPI: 72 year old female with a history of recurrent angioedema, husband says she has been to the hospital least 14 times in the last year for angioedema.  She was intubated once for this.  She is not on an ACE inhibitor and not on an ARB drug.  Past medical history as noted and listed in the chart.  Allergies:  Allergies  Allergen Reactions   Cozaar [Losartan] Swelling   Septra [Sulfamethoxazole-Trimethoprim] Anaphylaxis   Zinc Gelatin [Zinc] Anaphylaxis   Alpha-Gal    Amlodipine Besylate     REACTION: edema   Atorvastatin     REACTION: muscle pain   Beef Allergy    Cat Hair Extract    Dust Mite Extract    Neomycin-Bacitracin Zn-Polymyx     REACTION: rash Per pt not sure if from meds or not    ROS: Review of systems normal other than 12 systems except per HPI.  PMH:  Past Medical History:  Diagnosis Date   Angio-edema    Anxiety    Cataract    GERD (gastroesophageal reflux disease)    Hyperlipidemia    Hypertension    Hypothyroidism    Vitamin D deficiency     FH:  Family History  Problem Relation Age of Onset   Transient ischemic attack Mother    Hypertension Mother    Hypertension Father    Colon cancer Neg Hx    Esophageal cancer Neg Hx    Rectal cancer Neg Hx    Stomach cancer Neg Hx     SH:  Social History   Socioeconomic History   Marital status: Married    Spouse name: Not on file   Number of children: Not on file   Years of education: Not on file   Highest education level: Not on file  Occupational History   Not on file  Tobacco Use   Smoking status: Former    Current packs/day: 0.00    Types: Cigarettes    Quit date: 04/24/1996    Years since quitting: 26.7   Smokeless tobacco: Never  Vaping Use   Vaping status: Never Used  Substance and Sexual Activity   Alcohol use: Yes    Alcohol/week: 4.0 standard drinks of alcohol    Types: 4 Shots of liquor  per week    Comment: occasional   Drug use: No   Sexual activity: Not Currently  Other Topics Concern   Not on file  Social History Narrative   Not on file   Social Determinants of Health   Financial Resource Strain: Low Risk  (02/21/2022)   Overall Financial Resource Strain (CARDIA)    Difficulty of Paying Living Expenses: Not hard at all  Food Insecurity: No Food Insecurity (02/27/2022)   Hunger Vital Sign    Worried About Running Out of Food in the Last Year: Never true    Ran Out of Food in the Last Year: Never true  Transportation Needs: No Transportation Needs (02/27/2022)   PRAPARE - Administrator, Civil Service (Medical): No    Lack of Transportation (Non-Medical): No  Physical Activity: Insufficiently Active (02/21/2022)   Exercise Vital Sign    Days of Exercise per Week: 5 days    Minutes of Exercise per Session: 10 min  Stress: No Stress Concern Present (02/21/2022)   [employer] of Occupational Health - Occupational Stress Questionnaire    Feeling of Stress : Not at all  Social Connections: Socially Isolated (02/21/2022)   Social Connection and Isolation Panel [NHANES]    Frequency of Communication with Friends and Family: Once a week    Frequency of Social Gatherings with Friends and Family: Never    Attends Religious Services: Never    Database administrator or Organizations: No    Attends Banker Meetings: Never    Marital Status: Married  Catering manager Violence: Not At Risk (02/27/2022)   Humiliation, Afraid, Rape, and Kick questionnaire    Fear of Current or Ex-Partner: No    Emotionally Abused: No    Physically Abused: No    Sexually Abused: No    PSH:  Past Surgical History:  Procedure Laterality Date   CATARACT EXTRACTION W/ INTRAOCULAR LENS IMPLANT Right 12/29/2016   Dr. Mia Creek   CATARACT EXTRACTION W/ INTRAOCULAR LENS IMPLANT Left 01/15/2017   Dr. Mia Creek   COLONOSCOPY     LEEP     TUBAL LIGATION      VEIN LIGATION AND STRIPPING     x2    Physical  Exam: Patient in obvious distress no stridor noted however she has significant angioedema of the floor mouth and tongue with the tongue protruding from the mouth.  She is unable to phonate without significant difficulty.  External ears appear normal with anterior nose benign oral cavity as above.  Patient was decongested with Afrin spray.  A flexible fiberoptic laryngoscope was introduced through the right nostril.  Examination of the posterior pharynx showed some uvular edema.  The tongue base that there is significant swelling was also significant edema of the epiglottis the bilateral arytenoids.  The vocal folds were visualized.  The subglottis and trachea appeared normal.   A/P: Angioedema with pending airway obstruction Long talk with family including husband and I would recommend emergent intubation over fiberoptic bronchoscope for possible tracheostomy tube placement.  They are in agreement.  I have told husband with this long significant history I would recommend a tracheostomy tube for her if this angioedema has been this bad for the last year.  According to husband she has had workup but the genesis of the angioedema has not been determined.  Will take her emergently to the operating for airway security.  Risk and benefits clean bleeding infection airway obstruction and death were described she is eager to proceed.   Davina Poke 02/01/2023 1:27 PM

## 2023-02-01 NOTE — Plan of Care (Signed)
  Problem: Clinical Measurements: Goal: Ability to maintain clinical measurements within normal limits will improve Outcome: Progressing Goal: Will remain free from infection Outcome: Progressing Goal: Cardiovascular complication will be avoided Outcome: Progressing   Problem: Elimination: Goal: Will not experience complications related to urinary retention Outcome: Progressing   Problem: Pain Managment: Goal: General experience of comfort will improve Outcome: Progressing   Problem: Safety: Goal: Ability to remain free from injury will improve Outcome: Progressing   Problem: Skin Integrity: Goal: Risk for impaired skin integrity will decrease Outcome: Progressing

## 2023-02-01 NOTE — Anesthesia Preprocedure Evaluation (Signed)
Anesthesia Evaluation  Patient identified by MRN, date of birth, ID band Patient awake  General Assessment Comment:  Patient with severe angioedema and throat swelling, airway compromise. Emergently rushed from the ED to the OR. Extremely brief consent performed to the patient who was otherwise awake and alert, however could not verbalize anything due to her large tongue. Charting of the pre-op was done after the fact.  Reviewed: Allergy & Precautions, NPO status , Patient's Chart, lab work & pertinent test results, Unable to perform ROS - Chart review onlyPreop documentation limited or incomplete due to emergent nature of procedure.  History of Anesthesia Complications Negative for: history of anesthetic complications  Airway Mallampati: IV  TM Distance: >3 FB Neck ROM: Full  Mouth opening: Limited Mouth Opening Comment: Large tongue obscuring airway. Dental  (+) Teeth Intact   Pulmonary neg pulmonary ROS, neg sleep apnea, neg COPD, Patient abstained from smoking.Not current smoker, former smoker   breath sounds clear to auscultation       Cardiovascular Exercise Tolerance: Good METShypertension, Pt. on medications (-) CAD and (-) Past MI (-) dysrhythmias  Rhythm:Regular Rate:Tachycardia     Neuro/Psych  PSYCHIATRIC DISORDERS Anxiety     negative neurological ROS     GI/Hepatic ,GERD  ,,(+)     (-) substance abuse    Endo/Other  neg diabetesHypothyroidism    Renal/GU negative Renal ROS     Musculoskeletal   Abdominal   Peds  Hematology   Anesthesia Other Findings Past Medical History: No date: Angio-edema No date: Anxiety No date: Cataract No date: GERD (gastroesophageal reflux disease) No date: Hyperlipidemia No date: Hypertension No date: Hypothyroidism No date: Vitamin D deficiency  Reproductive/Obstetrics                             Anesthesia Physical Anesthesia Plan  ASA: 5  and emergent  Anesthesia Plan: General   Post-op Pain Management:    Induction: Intravenous  PONV Risk Score and Plan: 3 and Ondansetron, Dexamethasone and Treatment may vary due to age or medical condition  Airway Management Planned: Nasal ETT, Fiberoptic Intubation Planned and Awake Intubation Planned  Additional Equipment: None  Intra-op Plan:   Post-operative Plan: Post-operative intubation/ventilation  Informed Consent: I have reviewed the patients History and Physical, chart, labs and discussed the procedure including the risks, benefits and alternatives for the proposed anesthesia with the patient or authorized representative who has indicated his/her understanding and acceptance.     Dental advisory given and Only emergency history available  Plan Discussed with: CRNA and Surgeon  Anesthesia Plan Comments: (Plan for awake nasal fiberoptic intubation to be performed by Dr Jenne Campus, with Dr Andee Poles on standby in case of need for tracheostomy. Difficult airway equipment in room. Plan for sedation, and ICU ventilation post op. Briefly discussed risks of anesthesia with patient, including PONV, sore throat, lip/dental/eye damage. Rare risks discussed as well, such as cardiorespiratory and neurological sequelae, and allergic reactions. Discussed the role of CRNA in patient's perioperative care. Patient nodding understanding.)       Anesthesia Quick Evaluation

## 2023-02-01 NOTE — H&P (Addendum)
NAME:  Sharon [name], MRN:  [id number], DOB:  08/03/1950, LOS: 0 ADMISSION DATE:  02/01/2023  CHIEF COMPLAINT:  acute resp failure from anioedema  BRIEF SYNOPSIS  History of Present Illness:   72 year old female with a history of recurrent angioedema, husband says she has been to the hospital least 14 times in the last year for angioedema.  She was intubated once for this.  She is not on an ACE inhibitor and not on an ARB drug   ENT called to bedside Patient in obvious distress no stridor noted however she has significant angioedema of the floor mouth and tongue with the tongue protruding from the mouth.  She is unable to phonate without significant difficulty.  External ears appear normal with anterior nose benign oral cavity as above.  Patient was decongested with Afrin spray.  A flexible fiberoptic laryngoscope was introduced through the right nostril.  Examination of the posterior pharynx showed some uvular edema.  The tongue base that there is significant swelling was also significant edema of the epiglottis the bilateral arytenoids.  The vocal folds were visualized.  The subglottis and trachea appeared normal.  Patient was taken to OR for emergent nasal intubation  Case Discussed with ENT Plan for [facility] next week   She has unsteady airway   Wound Check No wounds or pressure ulcers noted upon admission to ICU   Pertinent  Medical History  Previous h/o ANGIOEDEMA   Significant Hospital Events: Including procedures, antibiotic start and stop dates in addition to other pertinent events   10/10 admitted for severe resp failure for severe angioedema     Antimicrobials:   Antibiotics Given (last 72 hours)     None             Objective   Blood pressure 112/63, pulse (!) 103, resp. rate 10, weight 97.5 kg, SpO2 100%.        Intake/Output Summary (Last 24 hours) at 02/01/2023 1335 Last data filed at 02/01/2023 1245 Gross per 24 hour  Intake 95.04 ml   Output --  Net 95.04 ml   Filed Weights   02/01/23 1222  Weight: 97.5 kg     REVIEW OF SYSTEMS  PATIENT IS UNABLE TO PROVIDE COMPLETE REVIEW OF SYSTEMS DUE TO SEVERE CRITICAL ILLNESS   PHYSICAL EXAMINATION:  GENERAL:critically ill appearing, +resp distress EYES: Pupils equal, round, reactive to light.  No scleral icterus.  MOUTH:  INTUBATED tongue protrusion and swelling NECK: Supple.  PULMONARY: Lungs clear to auscultation, +rhonchi,  CARDIOVASCULAR: S1 and S2.  Regular rate and rhythm GASTROINTESTINAL: Soft, nontender, -distended. Positive bowel sounds.  MUSCULOSKELETAL: No swelling, clubbing, or edema.  NEUROLOGIC: obtunded,sedated SKIN:normal, warm to touch, Capillary refill delayed  Pulses present bilaterally   Labs/imaging that I havepersonally reviewed  (right click and "Reselect all SmartList Selections" daily)       ASSESSMENT AND PLAN SYNOPSIS  72 yo white female with multiple episodes of angioedema with acute laryngeal edema due to  angioedema leading to severe resp failure ANGIOEDEMA WORK UP HAS BEEN NEGATIVE   Severe ACUTE Hypoxic  Respiratory Failure -continue Mechanical Ventilator support -Wean Fio2 and PEEP as tolerated -VAP/VENT bundle implementation - Wean PEEP & FiO2 as tolerated, maintain SpO2 > 88% - Head of bed elevated 30 degrees, VAP protocol in place - Plateau pressures less than 30 cm H20  - Intermittent chest x-ray & ABG PRN - Ensure adequate pulmonary hygiene  Plan for [facility] NEXT WEEK   CARDIAC ICU monitoring  RENAL -continue Foley Catheter-assess need -Avoid nephrotoxic agents -Follow urine output, BMP -Ensure adequate renal perfusion, optimize oxygenation -Renal dose medications   Intake/Output Summary (Last 24 hours) at 02/01/2023 1335 Last data filed at 02/01/2023 1245 Gross per 24 hour  Intake 95.04 ml  Output --  Net 95.04 ml   SEVERE ANGIOEDEMA PLAN FOR TRACH NEXT WEEK STEROIDS AND H2  blockers   NEUROLOGY need for sedation Goal RASS -2 to -3  ENDO - ICU hypoglycemic\Hyperglycemia protocol -check FSBS per protocol   GI GI PROPHYLAXIS as indicated  NUTRITIONAL STATUS DIET-->TF's as tolerated Constipation protocol as indicated   ELECTROLYTES -follow labs as needed -replace as needed -pharmacy consultation and following   ACUTE ANEMIA- TRANSFUSE AS NEEDED CONSIDER TRANSFUSION  IF HGB<7      Best practice (right click and "Reselect all SmartList Selections" daily)  Diet: NPO Pain/Anxiety/Delirium protocol (if indicated): Yes (RASS goal -3) VAP protocol (if indicated): Yes DVT prophylaxis: Contraindicated GI prophylaxis: H2B Mobility:  bed rest  Code Status:  FULL Disposition:ICU  Labs   CBC: Recent Labs  Lab 02/01/23 1223  WBC 9.1  NEUTROABS 5.7  HGB 11.0*  HCT 34.0*  MCV 89.9  PLT 278    Basic Metabolic Panel: Recent Labs  Lab 02/01/23 1223  NA 133*  K 3.1*  CL 97*  CO2 25  GLUCOSE 130*  BUN 20  CREATININE 1.00  CALCIUM 9.4   GFR: Estimated Creatinine Clearance: 60.1 mL/min (by C-G formula based on SCr of 1 mg/dL). Recent Labs  Lab 02/01/23 1223  WBC 9.1    Liver Function Tests: Recent Labs  Lab 02/01/23 1223  AST 23  ALT 11  ALKPHOS 71  BILITOT 0.9  PROT 7.6  ALBUMIN 4.1   No results for input(s): "LIPASE", "AMYLASE" in the last 168 hours. No results for input(s): "AMMONIA" in the last 168 hours.  ABG    Component Value Date/Time   PHART 7.41 04/17/2021 1817   PCO2ART 36 04/17/2021 1817   PO2ART 130 (H) 04/17/2021 1817   HCO3 22.8 04/17/2021 1817   ACIDBASEDEF 1.4 04/17/2021 1817   O2SAT 99.0 04/17/2021 1817     Coagulation Profile: No results for input(s): "INR", "PROTIME" in the last 168 hours.  Cardiac Enzymes: No results for input(s): "CKTOTAL", "CKMB", "CKMBINDEX", "TROPONINI" in the last 168 hours.  HbA1C: Hgb A1c MFr Bld  Date/Time Value Ref Range Status  01/02/2023 10:22 AM 6.2 4.6  - 6.5 % Final    Comment:    Glycemic Control Guidelines for People with Diabetes:Non Diabetic:  <6%Goal of Therapy: <7%Additional Action Suggested:  >8%   12/12/2021 09:06 AM 5.7 4.6 - 6.5 % Final    Comment:    Glycemic Control Guidelines for People with Diabetes:Non Diabetic:  <6%Goal of Therapy: <7%Additional Action Suggested:  >8%     CBG: No results for input(s): "GLUCAP" in the last 168 hours.   Past Medical History:  She,  has a past medical history of Angio-edema, Anxiety, Cataract, GERD (gastroesophageal reflux disease), Hyperlipidemia, Hypertension, Hypothyroidism, and Vitamin D deficiency.   Surgical History:   Past Surgical History:  Procedure Laterality Date   CATARACT EXTRACTION W/ INTRAOCULAR LENS IMPLANT Right 12/29/2016   Dr. Mia Creek   CATARACT EXTRACTION W/ INTRAOCULAR LENS IMPLANT Left 01/15/2017   Dr. Mia Creek   COLONOSCOPY     LEEP     TUBAL LIGATION     VEIN LIGATION AND STRIPPING     x2     Social History:  reports that she quit smoking about 26 years ago. Her smoking use included cigarettes. She has never used smokeless tobacco. She reports current alcohol use of about 4.0 standard drinks of alcohol per week. She reports that she does not use drugs.   Family History:  Her family history includes Hypertension in her father and mother; Transient ischemic attack in her mother. There is no history of Colon cancer, Esophageal cancer, Rectal cancer, or Stomach cancer.   Allergies Allergies  Allergen Reactions   Cozaar [Losartan] Swelling   Septra [Sulfamethoxazole-Trimethoprim] Anaphylaxis   Zinc Gelatin [Zinc] Anaphylaxis   Alpha-Gal    Amlodipine Besylate     REACTION: edema   Atorvastatin     REACTION: muscle pain   Beef Allergy    Cat Hair Extract    Dust Mite Extract    Neomycin-Bacitracin Zn-Polymyx     REACTION: rash Per pt not sure if from meds or not     Home Medications  Prior to Admission medications   Medication Sig  Start Date End Date Taking? Authorizing Provider  albuterol (VENTOLIN HFA) 108 (90 Base) MCG/ACT inhaler TAKE 2 PUFFS BY MOUTH EVERY 6 HOURS AS NEEDED FOR WHEEZE OR SHORTNESS OF BREATH 06/13/22   Tower, Audrie Gallus, MD  aspirin 81 MG tablet Take 81 mg by mouth daily.    [provider]  benzonatate (TESSALON) 200 MG capsule Take 1 capsule (200 mg total) by mouth 3 (three) times daily as needed for cough. Swallow whole 06/23/22   Tower, Audrie Gallus, MD  calcium carbonate (TUMS EX) 750 MG chewable tablet Chew 2 tablets by mouth at bedtime.    [provider]  Cholecalciferol (VITAMIN D) 1000 UNITS capsule Take 2,000 Units by mouth daily.    [provider]  cyanocobalamin (VITAMIN B12) 1000 MCG tablet Take 1,000 mcg by mouth daily.    [provider]  diphenhydrAMINE (BENADRYL ALLERGY) 25 MG tablet Take 1 tablet (25 mg total) by mouth every 6 (six) hours as needed. 02/15/22   Willeen Niece, MD  EPINEPHrine 0.3 mg/0.3 mL IJ SOAJ injection Inject 0.3 mg into the muscle as needed for anaphylaxis. 04/27/21   Tower, Audrie Gallus, MD  ezetimibe (ZETIA) 10 MG tablet TAKE 1 TABLET BY MOUTH EVERY DAY 08/29/22   Antonieta Iba, MD  famotidine (PEPCID) 20 MG tablet Take 1 tablet (20 mg total) by mouth 2 (two) times daily. 08/15/22   Irean Hong, MD  fexofenadine (ALLEGRA) 180 MG tablet Take 180 mg by mouth daily. 05/11/22   [provider]  fluticasone (FLONASE) 50 MCG/ACT nasal spray USE 2 SPRAYS IN BOTH NOSTRILS  DAILY 05/31/22   Tower, Audrie Gallus, MD  folic acid (FOLVITE) 1 MG tablet Take 1 tablet (1 mg total) by mouth daily. 02/16/22   Willeen Niece, MD  hydrochlorothiazide (HYDRODIURIL) 25 MG tablet TAKE 1 TABLET (25 MG TOTAL) BY MOUTH DAILY. 12/26/22   Tower, Audrie Gallus, MD  levocetirizine (XYZAL) 5 MG tablet Take 5 mg by mouth daily. 05/10/22   [provider]  levothyroxine (SYNTHROID) 50 MCG tablet TAKE 1 TABLET BY MOUTH DAILY  BEFORE BREAKFAST 11/09/21   Tower, Audrie Gallus, MD   metoprolol tartrate (LOPRESSOR) 25 MG tablet Take 1 tablet (25 mg total) by mouth 2 (two) times daily. 01/31/22   Furth, Cadence H, PA-C  oxybutynin (DITROPAN-XL) 10 MG 24 hr tablet Take one tablet am and pm 04/04/22   Michiel Cowboy A, PA-C  pantoprazole (PROTONIX) 40 MG tablet TAKE 1  TABLET BY MOUTH DAILY 11/09/21   Tower, Audrie Gallus, MD  rosuvastatin (CRESTOR) 20 MG tablet TAKE 1 TABLET BY MOUTH DAILY 12/08/22   Tower, Audrie Gallus, MD  sertraline (ZOLOFT) 100 MG tablet TAKE 1 TABLET BY MOUTH DAILY 11/09/21   Tower, Audrie Gallus, MD  thiamine (VITAMIN B-1) 100 MG tablet Take 1 tablet (100 mg total) by mouth daily. 02/16/22   Willeen Niece, MD       DVT/GI PRX  assessed I Assessed the need for Labs I Assessed the need for Foley I Assessed the need for Central Venous Line Family Discussion when available I Assessed the need for Mobilization I made an Assessment of medications to be adjusted accordingly Safety Risk assessment completed  CASE DISCUSSED IN MULTIDISCIPLINARY ROUNDS WITH ICU TEAM  Critical Care Time devoted to patient care services described in this note is 75 minutes.  Critical care was necessary to treat /prevent imminent and life-threatening deterioration.   I ANTICIPATE PROLONGED ICU LOS  Patient is critically ill. Patient with Multiorgan failure and at high risk for cardiac arrest and death.    Lucie Leather, M.D.  [facility]  Medical Director [facility] Medical Director [facility] Cardio-Pulmonary Department

## 2023-02-01 NOTE — ED Provider Notes (Signed)
[facility] Provider Note    Event Date/Time   First MD Initiated Contact with Patient 02/01/23 1212     (approximate)   History   Chief Complaint Angioedema   HPI  Sharon [name] is a 72 y.o. female with past medical history of hypertension and recurrent angioedema who presents to the ED complaining of angioedema.  Patient reports that she first began to notice her tongue was swollen around 10:00 this morning, about 2 hours prior to arrival.  Husband attempted to give IM epinephrine, but states he is not sure that he did it correctly as it was his first time.  Tongue swelling has gradually worsened since then and patient reports feeling slightly short of breath.  She has not had any itching or hives, denies any nausea, vomiting, diarrhea, or lightheadedness.  She has had similar episodes of angioedema in the past, has not taken an ACE inhibitor or ARB in almost 2 years.  She reports need to be intubated for this issue in the past.     Physical Exam   Triage Vital Signs: ED Triage Vitals  Encounter Vitals Group     BP      Systolic BP Percentile      Diastolic BP Percentile      Pulse      Resp      Temp      Temp src      SpO2      Weight      Height      Head Circumference      Peak Flow      Pain Score      Pain Loc      Pain Education      Exclude from Growth Chart     Most recent vital signs: Vitals:   02/01/23 1245 02/01/23 1248  BP: (!) 196/109 (!) 197/105  Pulse: (!) 115 (!) 115  Resp: 20 15  SpO2: 100% 99%    Constitutional: Alert and oriented. Eyes: Conjunctivae are normal. Head: Atraumatic. Nose: No congestion/rhinnorhea. Mouth/Throat: Mucous membranes are moist.  Significant tongue edema with significant edema underneath the tongue causing it to be pushed up against the hard palate.  Unable to visualize soft palate and posterior oropharynx. Cardiovascular: Tachycardic, regular rhythm. Grossly normal heart sounds.  2+  radial pulses bilaterally. Respiratory: Normal respiratory effort.  No retractions. Lungs CTAB. Gastrointestinal: Soft and nontender. No distention. Musculoskeletal: No lower extremity tenderness nor edema.  Neurologic:  Normal speech and language. No gross focal neurologic deficits are appreciated.    ED Results / Procedures / Treatments   Labs (all labs ordered are listed, but only abnormal results are displayed) Labs Reviewed  CBC WITH DIFFERENTIAL/PLATELET - Abnormal; Notable for the following components:      Result Value   RBC 3.78 (*)    Hemoglobin 11.0 (*)    HCT 34.0 (*)    nRBC 0.3 (*)    All other components within normal limits  COMPREHENSIVE METABOLIC PANEL - Abnormal; Notable for the following components:   Sodium 133 (*)    Potassium 3.1 (*)    Chloride 97 (*)    Glucose, Bld 130 (*)    GFR, Estimated 60 (*)    All other components within normal limits     EKG  ED ECG REPORT I, Chesley Noon, the attending physician, personally viewed and interpreted this ECG.   Date: 02/01/2023  EKG Time: 12:26  Rate: 132  Rhythm:  sinus tachycardia  Axis: Normal  Intervals:none  ST&T Change: None  PROCEDURES:  Critical Care performed: Yes, see critical care procedure note(s)  .Critical Care  Performed by: Chesley Noon, MD Authorized by: Chesley Noon, MD   Critical care provider statement:    Critical care time (minutes):  30   Critical care time was exclusive of:  Separately billable procedures and treating other patients and teaching time   Critical care was necessary to treat or prevent imminent or life-threatening deterioration of the following conditions: Severe angioedema.   Critical care was time spent personally by me on the following activities:  Development of treatment plan with patient or surrogate, discussions with consultants, evaluation of patient's response to treatment, examination of patient, ordering and review of laboratory studies,  ordering and review of radiographic studies, ordering and performing treatments and interventions, pulse oximetry, re-evaluation of patient's condition and review of old charts   I assumed direction of critical care for this patient from another provider in my specialty: no     Care discussed with: admitting provider      MEDICATIONS ORDERED IN ED: Medications  0.9 %  sodium chloride infusion (has no administration in time range)  lidocaine (XYLOCAINE) 4 % external solution ( Topical Not Given 02/01/23 1249)  EPINEPHrine (EPI-PEN) 0.3 mg/0.3 mL injection (0.3 mg  Given 02/01/23 1210)  famotidine (PEPCID) IVPB 20 mg premix (0 mg Intravenous Stopped 02/01/23 1247)  methylPREDNISolone sodium succinate (SOLU-MEDROL) 125 mg/2 mL injection 125 mg (125 mg Intravenous Given 02/01/23 1218)  diphenhydrAMINE (BENADRYL) injection 50 mg (50 mg Intravenous Given 02/01/23 1219)  tranexamic acid (CYKLOKAPRON) IVPB 1,000 mg (0 mg Intravenous Stopped 02/01/23 1245)  C1 esterase inhibitor (Human) (BERINERT) injection 2,000 Units (2,000 Units Intravenous Given 02/01/23 1245)  oxymetazoline (AFRIN) 0.05 % nasal spray 2 spray (2 sprays Each Nare Given 02/01/23 1232)     IMPRESSION / MDM / ASSESSMENT AND PLAN / ED COURSE  I reviewed the triage vital signs and the nursing notes.                              72 y.o. female with past medical history of hypertension and recurrent angioedema who presents to the ED complaining of increasing tongue swelling over the past 2 hours.  Patient's presentation is most consistent with acute presentation with potential threat to life or bodily function.  Differential diagnosis includes, but is not limited to, medication induced angioedema, allergic reaction, anaphylaxis, hereditary angioedema.  Patient noted to have significant tongue swelling on arrival, especially underneath her tongue which is forcing the tongue up to her hard palate.  Is very difficult for her to speak  and she reports some mild difficulty breathing, but is not in any respiratory distress.  I am unable to visualize her posterior oropharynx, case discussed with Dr. Jenne Campus of ENT who will come to the ED for NP scope.  Patient given IM epinephrine along with IV Benadryl, Solu-Medrol, and Pepcid.  We will also treat with IV TXA as well as C1 esterase inhibitor as there is no clear cause of her angioedema.  NP scope performed at the bedside by Dr. Jenne Campus of ENT, patient noted to have significant laryngeal edema and he recommends proceeding with nasopharyngeal intubation in the operating room.  Labs show no significant anemia, leukocytosis, tach abnormality, or AKI.  LFTs are unremarkable.  Case discussed with ICU team for admission.      FINAL CLINICAL  IMPRESSION(S) / ED DIAGNOSES   Final diagnoses:  Angioedema, initial encounter     Rx / DC Orders   ED Discharge Orders     None        Note:  This document was prepared using Dragon voice recognition software and may include unintentional dictation errors.   Chesley Noon, MD 02/01/23 1254

## 2023-02-02 ENCOUNTER — Other Ambulatory Visit: Payer: Self-pay | Admitting: Family Medicine

## 2023-02-02 ENCOUNTER — Inpatient Hospital Stay: Payer: Medicare Other

## 2023-02-02 DIAGNOSIS — T783XXD Angioneurotic edema, subsequent encounter: Secondary | ICD-10-CM | POA: Diagnosis not present

## 2023-02-02 LAB — TRIGLYCERIDES: Triglycerides: 147 mg/dL (ref <150)

## 2023-02-02 LAB — RENAL FUNCTION PANEL
Albumin: 3.4 g/dL — ABNORMAL LOW (ref 3.5–5.0)
Anion gap: 14 (ref 5–15)
BUN: 20 mg/dL (ref 8–23)
CO2: 23 mmol/L (ref 22–32)
Calcium: 9.3 mg/dL (ref 8.9–10.3)
Chloride: 102 mmol/L (ref 98–111)
Creatinine, Ser: 0.94 mg/dL (ref 0.44–1.00)
GFR, Estimated: >60 mL/min (ref >60)
Glucose, Bld: 158 mg/dL — ABNORMAL HIGH (ref 70–99)
Phosphorus: 3.7 mg/dL (ref 2.5–4.6)
Potassium: 4 mmol/L (ref 3.5–5.1)
Sodium: 139 mmol/L (ref 135–145)

## 2023-02-02 LAB — CBC
HCT: 29.3 % — ABNORMAL LOW (ref 36.0–46.0)
Hemoglobin: 9.6 g/dL — ABNORMAL LOW (ref 12.0–15.0)
MCH: 28.5 pg (ref 26.0–34.0)
MCHC: 32.8 g/dL (ref 30.0–36.0)
MCV: 86.9 fL (ref 80.0–100.0)
Platelets: 227 10*3/uL (ref 150–400)
RBC: 3.37 MIL/uL — ABNORMAL LOW (ref 3.87–5.11)
RDW: 14.5 % (ref 11.5–15.5)
WBC: 5 10*3/uL (ref 4.0–10.5)
nRBC: 0 % (ref 0.0–0.2)

## 2023-02-02 LAB — MAGNESIUM: Magnesium: 2 mg/dL (ref 1.7–2.4)

## 2023-02-02 LAB — GLUCOSE, CAPILLARY: Glucose-Capillary: 150 mg/dL — ABNORMAL HIGH (ref 70–99)

## 2023-02-02 MED ORDER — CYANOCOBALAMIN 1000 MCG/ML IJ SOLN
1000.0000 ug | Freq: Once | INTRAMUSCULAR | Status: AC
  Administered 2023-02-02: 1000 ug via INTRAMUSCULAR
  Filled 2023-02-02: qty 1

## 2023-02-02 MED ORDER — NALOXONE HCL 0.4 MG/ML IJ SOLN
INTRAMUSCULAR | Status: AC
  Filled 2023-02-02: qty 1

## 2023-02-02 MED ORDER — CHLORHEXIDINE GLUCONATE CLOTH 2 % EX PADS
6.0000 | MEDICATED_PAD | Freq: Every day | CUTANEOUS | Status: DC
  Administered 2023-02-02 – 2023-02-09 (×8): 6 via TOPICAL

## 2023-02-02 MED ORDER — IPRATROPIUM-ALBUTEROL 0.5-2.5 (3) MG/3ML IN SOLN: 3.0000 mL | RESPIRATORY_TRACT | Status: DC | PRN

## 2023-02-02 MED ORDER — IPRATROPIUM-ALBUTEROL 0.5-2.5 (3) MG/3ML IN SOLN
RESPIRATORY_TRACT | Status: AC
  Administered 2023-02-02: 3 mL via RESPIRATORY_TRACT
  Filled 2023-02-02: qty 3

## 2023-02-02 NOTE — Progress Notes (Addendum)
NAME:  Sharon [name], MRN:  [id number], DOB:  02-17-1951, LOS: 1 ADMISSION DATE:  02/01/2023  CHIEF COMPLAINT:  acute resp failure from anioedema  BRIEF SYNOPSIS  History of Present Illness:   72 year old female with a history of recurrent angioedema, husband says she has been to the hospital least 14 times in the last year for angioedema.  She was intubated once for this.  She is not on an ACE inhibitor and not on an ARB drug   ENT called to bedside Patient in obvious distress no stridor noted however she has significant angioedema of the floor mouth and tongue with the tongue protruding from the mouth.  She is unable to phonate without significant difficulty.  External ears appear normal with anterior nose benign oral cavity as above.  Patient was decongested with Afrin spray.  A flexible fiberoptic laryngoscope was introduced through the right nostril.  Examination of the posterior pharynx showed some uvular edema.  The tongue base that there is significant swelling was also significant edema of the epiglottis the bilateral arytenoids.  The vocal folds were visualized.  The subglottis and trachea appeared normal.  Patient was taken to OR for emergent nasal intubation  Case Discussed with ENT Plan for [facility] next week   She has unsteady airway   Wound Check No wounds or pressure ulcers noted upon admission to ICU   Pertinent  Medical History  Previous h/o ANGIOEDEMA   Significant Hospital Events: Including procedures, antibiotic start and stop dates in addition to other pertinent events   10/10 admitted for severe resp failure for severe angioedema 10/11: remains intubated    Antimicrobials:   Antibiotics Given (last 72 hours)     None      Objective   Blood pressure (!) 111/59, pulse (!) 48, temperature 98.8 F (37.1 C), temperature source Oral, resp. rate 16, weight 97.5 kg, SpO2 95%.    Vent Mode: PRVC FiO2 (%):  [35 %-40 %] 35 % Set Rate:  [16 bmp] 16  bmp Vt Set:  [450 mL] 450 mL PEEP:  [5 cmH20] 5 cmH20 Plateau Pressure:  [16 cmH20-17 cmH20] 16 cmH20   Intake/Output Summary (Last 24 hours) at 02/02/2023 4098 Last data filed at 02/02/2023 0700 Gross per 24 hour  Intake 1548.96 ml  Output 1320 ml  Net 228.96 ml   Filed Weights   02/01/23 1222  Weight: 97.5 kg     REVIEW OF SYSTEMS  PATIENT IS UNABLE TO PROVIDE COMPLETE REVIEW OF SYSTEMS DUE TO SEVERE CRITICAL ILLNESS   PHYSICAL EXAMINATION:  GENERAL:critically ill appearing, +resp distress EYES: Pupils equal, round, reactive to light.  No scleral icterus.  MOUTH:  INTUBATED tongue protrusion and swelling NECK: Supple.  PULMONARY: Lungs clear to auscultation, +rhonchi,  CARDIOVASCULAR: S1 and S2.  Regular rate and rhythm GASTROINTESTINAL: Soft, nontender, -distended. Positive bowel sounds.  MUSCULOSKELETAL: No swelling, clubbing, or edema.  NEUROLOGIC: obtunded,sedated SKIN:normal, warm to touch, Capillary refill delayed  Pulses present bilaterally  ASSESSMENT AND PLAN SYNOPSIS  72 yo white female with multiple episodes of angioedema with acute laryngeal edema due to  angioedema leading to severe resp failure ANGIOEDEMA WORK UP HAS BEEN NEGATIVE   Severe ACUTE Hypoxic  Respiratory Failure -continue Mechanical Ventilator support -Wean Fio2 and PEEP as tolerated -VAP/VENT bundle implementation -Wean PEEP & FiO2 as tolerated, maintain SpO2 > 88% -Head of bed elevated 30 degrees, VAP protocol in place -Plateau pressures less than 30 cm H20  -Intermittent chest x-ray & ABG PRN -Ensure  adequate pulmonary hygiene  -Lung protective ventilator strategies as we plan for elective tracheostomy Monday  CARDIAC ICU monitoring  RENAL -continue Foley Catheter-assess need -Avoid nephrotoxic agents -Follow urine output, BMP -Ensure adequate renal perfusion, optimize oxygenation -Renal dose medications  Intake/Output Summary (Last 24 hours) at 02/02/2023 0807 Last data  filed at 02/02/2023 0700 Gross per 24 hour  Intake 1548.96 ml  Output 1320 ml  Net 228.96 ml   SEVERE ANGIOEDEMA PLAN FOR TRACH NEXT WEEK STEROIDS AND H2 blockers  NEUROLOGY need for sedation She remains comfortable Goal RASS -2 to -3  ENDO -ICU hypoglycemic\Hyperglycemia protocol -check FSBS per protocol  GI GI PROPHYLAXIS as indicated  NUTRITIONAL STATUS DIET->TF's as tolerated Constipation protocol as indicated   ELECTROLYTES -follow labs as needed -replace as needed -pharmacy consultation and following  ACUTE ANEMIA- TRANSFUSE AS NEEDED CONSIDER TRANSFUSION  IF HGB<7  Best practice (right click and "Reselect all SmartList Selections" daily)  Diet: Start tube feeds Pain/Anxiety/Delirium protocol (if indicated): Yes (RASS goal -3) VAP protocol (if indicated): Yes DVT prophylaxis: Lovenox GI prophylaxis: H2B Mobility:  bed rest  Code Status:  FULL Disposition:ICU  Labs   CBC: Recent Labs  Lab 02/01/23 1223 02/02/23 0306  WBC 9.1 5.0  NEUTROABS 5.7  --   HGB 11.0* 9.6*  HCT 34.0* 29.3*  MCV 89.9 86.9  PLT 278 227    Basic Metabolic Panel: Recent Labs  Lab 02/01/23 1223 02/02/23 0306  NA 133* 139  K 3.1* 4.0  CL 97* 102  CO2 25 23  GLUCOSE 130* 158*  BUN 20 20  CREATININE 1.00 0.94  CALCIUM 9.4 9.3  MG 1.7 2.0  PHOS  --  3.7   GFR: Estimated Creatinine Clearance: 64 mL/min (by C-G formula based on SCr of 0.94 mg/dL). Recent Labs  Lab 02/01/23 1223 02/02/23 0306  WBC 9.1 5.0    Liver Function Tests: Recent Labs  Lab 02/01/23 1223 02/02/23 0306  AST 23  --   ALT 11  --   ALKPHOS 71  --   BILITOT 0.9  --   PROT 7.6  --   ALBUMIN 4.1 3.4*   No results for input(s): "LIPASE", "AMYLASE" in the last 168 hours. No results for input(s): "AMMONIA" in the last 168 hours.  ABG    Component Value Date/Time   PHART 7.38 02/01/2023 1357   PCO2ART 46 02/01/2023 1357   PO2ART 108 02/01/2023 1357   HCO3 27.2 02/01/2023 1357    ACIDBASEDEF 1.4 04/17/2021 1817   O2SAT 98.8 02/01/2023 1357     Coagulation Profile: No results for input(s): "INR", "PROTIME" in the last 168 hours.  Cardiac Enzymes: No results for input(s): "CKTOTAL", "CKMB", "CKMBINDEX", "TROPONINI" in the last 168 hours.  HbA1C: Hgb A1c MFr Bld  Date/Time Value Ref Range Status  01/02/2023 10:22 AM 6.2 4.6 - 6.5 % Final    Comment:    Glycemic Control Guidelines for People with Diabetes:Non Diabetic:  <6%Goal of Therapy: <7%Additional Action Suggested:  >8%   12/12/2021 09:06 AM 5.7 4.6 - 6.5 % Final    Comment:    Glycemic Control Guidelines for People with Diabetes:Non Diabetic:  <6%Goal of Therapy: <7%Additional Action Suggested:  >8%    CBG: Recent Labs  Lab 02/01/23 1333 02/01/23 1955 02/01/23 2304 02/02/23 0418  GLUCAP 154* 152* 151* 150*   Past Medical History:  She,  has a past medical history of Angio-edema, Anxiety, Cataract, GERD (gastroesophageal reflux disease), Hyperlipidemia, Hypertension, Hypothyroidism, and Vitamin D deficiency.  Surgical History:   Past Surgical History:  Procedure Laterality Date   CATARACT EXTRACTION W/ INTRAOCULAR LENS IMPLANT Right 12/29/2016   Dr. Mia Creek   CATARACT EXTRACTION W/ INTRAOCULAR LENS IMPLANT Left 01/15/2017   Dr. Mia Creek   COLONOSCOPY     LEEP     TUBAL LIGATION     VEIN LIGATION AND STRIPPING     x2     Social History:   reports that she quit smoking about 26 years ago. Her smoking use included cigarettes. She has never used smokeless tobacco. She reports current alcohol use of about 4.0 standard drinks of alcohol per week. She reports that she does not use drugs.   Family History:  Her family history includes Hypertension in her father and mother; Transient ischemic attack in her mother. There is no history of Colon cancer, Esophageal cancer, Rectal cancer, or Stomach cancer.   Allergies Allergies  Allergen Reactions   Cozaar [Losartan] Swelling   Septra  [Sulfamethoxazole-Trimethoprim] Anaphylaxis   Zinc Gelatin [Zinc] Anaphylaxis   Alpha-Gal    Amlodipine Besylate     REACTION: edema   Atorvastatin     REACTION: muscle pain   Beef Allergy    Cat Hair Extract    Dust Mite Extract    Neomycin-Bacitracin Zn-Polymyx     REACTION: rash Per pt not sure if from meds or not   Home Medications  Prior to Admission medications   Medication Sig Start Date End Date Taking? Authorizing Provider  albuterol (VENTOLIN HFA) 108 (90 Base) MCG/ACT inhaler TAKE 2 PUFFS BY MOUTH EVERY 6 HOURS AS NEEDED FOR WHEEZE OR SHORTNESS OF BREATH 06/13/22   Tower, Audrie Gallus, MD  aspirin 81 MG tablet Take 81 mg by mouth daily.    [provider]  benzonatate (TESSALON) 200 MG capsule Take 1 capsule (200 mg total) by mouth 3 (three) times daily as needed for cough. Swallow whole 06/23/22   Tower, Audrie Gallus, MD  calcium carbonate (TUMS EX) 750 MG chewable tablet Chew 2 tablets by mouth at bedtime.    [provider]  Cholecalciferol (VITAMIN D) 1000 UNITS capsule Take 2,000 Units by mouth daily.    [provider]  cyanocobalamin (VITAMIN B12) 1000 MCG tablet Take 1,000 mcg by mouth daily.    [provider]  diphenhydrAMINE (BENADRYL ALLERGY) 25 MG tablet Take 1 tablet (25 mg total) by mouth every 6 (six) hours as needed. 02/15/22   Willeen Niece, MD  EPINEPHrine 0.3 mg/0.3 mL IJ SOAJ injection Inject 0.3 mg into the muscle as needed for anaphylaxis. 04/27/21   Tower, Audrie Gallus, MD  ezetimibe (ZETIA) 10 MG tablet TAKE 1 TABLET BY MOUTH EVERY DAY 08/29/22   Antonieta Iba, MD  famotidine (PEPCID) 20 MG tablet Take 1 tablet (20 mg total) by mouth 2 (two) times daily. 08/15/22   Irean Hong, MD  fexofenadine (ALLEGRA) 180 MG tablet Take 180 mg by mouth daily. 05/11/22   [provider]  fluticasone (FLONASE) 50 MCG/ACT nasal spray USE 2 SPRAYS IN BOTH NOSTRILS  DAILY 05/31/22   Tower, Audrie Gallus, MD  folic acid (FOLVITE) 1 MG tablet Take 1  tablet (1 mg total) by mouth daily. 02/16/22   Willeen Niece, MD  hydrochlorothiazide (HYDRODIURIL) 25 MG tablet TAKE 1 TABLET (25 MG TOTAL) BY MOUTH DAILY. 12/26/22   Tower, Audrie Gallus, MD  levocetirizine (XYZAL) 5 MG tablet Take 5 mg by mouth daily. 05/10/22   [provider]  levothyroxine (SYNTHROID)  50 MCG tablet TAKE 1 TABLET BY MOUTH DAILY  BEFORE BREAKFAST 11/09/21   Tower, Audrie Gallus, MD  metoprolol tartrate (LOPRESSOR) 25 MG tablet Take 1 tablet (25 mg total) by mouth 2 (two) times daily. 01/31/22   Furth, Cadence H, PA-C  oxybutynin (DITROPAN-XL) 10 MG 24 hr tablet Take one tablet am and pm 04/04/22   Michiel Cowboy A, PA-C  pantoprazole (PROTONIX) 40 MG tablet TAKE 1 TABLET BY MOUTH DAILY 11/09/21   Tower, Audrie Gallus, MD  rosuvastatin (CRESTOR) 20 MG tablet TAKE 1 TABLET BY MOUTH DAILY 12/08/22   Tower, Audrie Gallus, MD  sertraline (ZOLOFT) 100 MG tablet TAKE 1 TABLET BY MOUTH DAILY 11/09/21   Tower, Audrie Gallus, MD  thiamine (VITAMIN B-1) 100 MG tablet Take 1 tablet (100 mg total) by mouth daily. 02/16/22   Willeen Niece, MD   DVT/GI PRX  assessed I Assessed the need for Labs I Assessed the need for Foley I Assessed the need for Central Venous Line Family Discussion when available I Assessed the need for Mobilization I made an Assessment of medications to be adjusted accordingly Safety Risk assessment completed  CASE DISCUSSED IN MULTIDISCIPLINARY ROUNDS WITH ICU TEAM  Critical Care Time devoted to patient care services described in this note is 110 minutes.  Critical care was necessary to treat /prevent imminent and life-threatening deterioration.   I ANTICIPATE PROLONGED ICU LOS  Patient is critically ill. Patient with Multiorgan failure and at high risk for cardiac arrest and death.   Rhea Bleacher MD Pulmonary & Critical Care Medicine

## 2023-02-02 NOTE — Progress Notes (Signed)
PHARMACY CONSULT NOTE  Pharmacy Consult for Electrolyte Monitoring and Replacement   Recent Labs: Potassium (mmol/L)  Date Value  02/02/2023 4.0   Magnesium (mg/dL)  Date Value  40/98/1191 2.0   Calcium (mg/dL)  Date Value  47/82/9562 9.3   Albumin (g/dL)  Date Value  13/11/6576 3.4 (L)   Phosphorus (mg/dL)  Date Value  46/96/2952 3.7   Sodium (mmol/L)  Date Value  02/02/2023 139     Assessment: 72 year old female with history of recurrent angioedema admitted to CCU after recurrent angioedema episode. Patient is ventilated and sedated on fentanyl infusion and propofol.  Goal of Therapy:  Electrolytes within normal limits  Plan:  No replacement warranted Follow up electrolytes tomorrow AM    Elliot Gurney, PharmD, BCPS Clinical Pharmacist  02/02/2023 3:39 PM

## 2023-02-02 NOTE — Progress Notes (Signed)
..02/02/2023 1:27 PM  Sharon [name] [id number]  Post-Op Day 1    Temp:  [97.8 F (36.6 C)-98.9 F (37.2 C)] 98.4 F (36.9 C) (10/11 1200) Pulse Rate:  [44-106] 48 (10/11 1200) Resp:  [0-18] 16 (10/11 1200) BP: (105-153)/(57-94) 133/69 (10/11 1200) SpO2:  [94 %-100 %] 96 % (10/11 1200) FiO2 (%):  [35 %-40 %] 35 % (10/11 1112),     Intake/Output Summary (Last 24 hours) at 02/02/2023 1327 Last data filed at 02/02/2023 1200 Gross per 24 hour  Intake 1521.44 ml  Output 1595 ml  Net -73.56 ml    Results for orders placed or performed during the hospital encounter of 02/01/23 (from the past 24 hour(s))  Glucose, capillary     Status: Abnormal   Collection Time: 02/01/23  1:33 PM  Result Value Ref Range   Glucose-Capillary 154 (H) 70 - 99 mg/dL  Blood gas, arterial     Status: None   Collection Time: 02/01/23  1:57 PM  Result Value Ref Range   FIO2 40 %   Mode PRESSURE REGULATED VOLUME CONTROL    MECHVT 450 mL   PEEP 5 cm H20   pH, Arterial 7.38 7.35 - 7.45   pCO2 arterial 46 32 - 48 mmHg   pO2, Arterial 108 83 - 108 mmHg   Bicarbonate 27.2 20.0 - 28.0 mmol/L   Acid-Base Excess 1.5 0.0 - 2.0 mmol/L   O2 Saturation 98.8 %   Patient temperature 37.0    Collection site BRACHIAL ARTERY    Mechanical Rate 16   MRSA Next Gen by PCR, Nasal     Status: None   Collection Time: 02/01/23  2:07 PM   Specimen: Nasal Mucosa; Nasal Swab  Result Value Ref Range   MRSA by PCR Next Gen NOT DETECTED NOT DETECTED  Culture, blood (Routine X 2) w Reflex to ID Panel     Status: None (Preliminary result)   Collection Time: 02/01/23  2:22 PM   Specimen: BLOOD  Result Value Ref Range   Specimen Description BLOOD RIGHT ANTECUBITAL    Special Requests      BOTTLES DRAWN AEROBIC AND ANAEROBIC Blood Culture adequate volume   Culture      NO GROWTH < 24 HOURS Performed at [facility], [address], [place], [place] [address]    Report Status PENDING   Culture, blood  (Routine X 2) w Reflex to ID Panel     Status: None (Preliminary result)   Collection Time: 02/01/23  5:47 PM   Specimen: BLOOD  Result Value Ref Range   Specimen Description BLOOD BLOOD RIGHT HAND    Special Requests      BOTTLES DRAWN AEROBIC AND ANAEROBIC Blood Culture adequate volume   Culture      NO GROWTH < 12 HOURS Performed at [facility], [address], [place], [place] [address]    Report Status PENDING   Glucose, capillary     Status: Abnormal   Collection Time: 02/01/23  7:55 PM  Result Value Ref Range   Glucose-Capillary 152 (H) 70 - 99 mg/dL  Glucose, capillary     Status: Abnormal   Collection Time: 02/01/23 11:04 PM  Result Value Ref Range   Glucose-Capillary 151 (H) 70 - 99 mg/dL  Triglycerides     Status: None   Collection Time: 02/02/23  3:06 AM  Result Value Ref Range   Triglycerides 147 <150 mg/dL  CBC     Status: Abnormal   Collection Time:  02/02/23  3:06 AM  Result Value Ref Range   WBC 5.0 4.0 - 10.5 K/uL   RBC 3.37 (L) 3.87 - 5.11 MIL/uL   Hemoglobin 9.6 (L) 12.0 - 15.0 g/dL   HCT 16.1 (L) 09.6 - 04.5 %   MCV 86.9 80.0 - 100.0 fL   MCH 28.5 26.0 - 34.0 pg   MCHC 32.8 30.0 - 36.0 g/dL   RDW 40.9 81.1 - 91.4 %   Platelets 227 150 - 400 K/uL   nRBC 0.0 0.0 - 0.2 %  Renal function panel     Status: Abnormal   Collection Time: 02/02/23  3:06 AM  Result Value Ref Range   Sodium 139 135 - 145 mmol/L   Potassium 4.0 3.5 - 5.1 mmol/L   Chloride 102 98 - 111 mmol/L   CO2 23 22 - 32 mmol/L   Glucose, Bld 158 (H) 70 - 99 mg/dL   BUN 20 8 - 23 mg/dL   Creatinine, Ser 7.82 0.44 - 1.00 mg/dL   Calcium 9.3 8.9 - 95.6 mg/dL   Phosphorus 3.7 2.5 - 4.6 mg/dL   Albumin 3.4 (L) 3.5 - 5.0 g/dL   GFR, Estimated >21 >30 mL/min   Anion gap 14 5 - 15  Magnesium     Status: None   Collection Time: 02/02/23  3:06 AM  Result Value Ref Range   Magnesium 2.0 1.7 - 2.4 mg/dL  Glucose, capillary     Status: Abnormal   Collection Time: 02/02/23  4:18 AM   Result Value Ref Range   Glucose-Capillary 150 (H) 70 - 99 mg/dL    SUBJECTIVE:  No acute events.  Sedated overnight  OBJECTIVE:  GEN-  NAD, in bed with head elevated.  Sedated NOSE-  Nasal ETT in place in right nare and secured with plastic adapter and tape.  No obvious necrosis today OC/OP-  improved but continued significant oropharyngeal tongue and floor of mouth edema preventing mouth closure NECK-  obese neck with limited landmarks  IMPRESSION:  s/p Emergent Nasal Intubation for Rapidly Progressing Angioedema of unknown cause  PLAN:  Reviewed prior ER records and workup from [facility] showing Alpha-gal positive and negative hereditary angioedema.  Husband reports they are avoiding all mammalian products except when I asked what type of milk she drinks, he stated Organic dairy milk.  I am unclear if this is enough to cause these reactions but instructed him that small amounts of alpha gal are found in dairy milk and that I recommend avoidance of that in future.  Recommend evaluation in future at [facility] by Dr. Cathrine Muster as well to determine if he feels alpha gal is main source of recurrent angioedema or not.    Had a long discussion with husband regarding planned tracheostomy for Tuesday of next week.  Discussed that it may be permanent or temporary.  Discussed that it could be capped majority of the time but uncapped if needed.  Discussed that she should still be able to live relatively normal live with a tracheostomy tube and that it could be easily removed and would close up after 48 hours if she decided she did not want it.  After discussion, he reported that he wants his wife to make the decision if possible and would prefer that she we awakened and extubated and then let her make the call on whether to proceed with tracheostomy tube placement.  I discussed that this could be risky as we still are not sure of what is  triggering angioedema and why it is getting more frequent and  worse.  He understands that his wife is not a good emergent airway candidate and that in the future if this occurs without doing a tracheostomy tube placement that his wife may ultimately die due to failure to be able to obtain an airway.  Patient's husband demonstrated understand and wishes to continue to see if we can extubate in a normal fashion after angioedema and have his wife participate in the decision process.  He also understands that if the current swelling persists that his wife may need a tracheostomy tube anyway to be able to get off the ventilator.  Sharon [name] 02/02/2023, 1:27 PM

## 2023-02-02 NOTE — Progress Notes (Addendum)
Initial Nutrition Assessment  DOCUMENTATION CODES:   Obesity unspecified  INTERVENTION:   If NGT able to be placed, recommend:  Vital HP @20ml /hr continuous + ProSource TF 20- Give 60ml QID via tube  Free water flushes 30ml q4 hours to maintain tube patency   Propofol: 26.2 ml/hr- provides 692kcal/day   Regimen provides 1492kcal/day, 122g/day protein and 547ml/day of free water.   Liquid MVI daily via tube  Cholecalciferol 1000 units daily via tube  Pt at refeed risk; recommend monitor potassium, magnesium and phosphorus labs daily until stable  Daily weights   NUTRITION DIAGNOSIS:   Inadequate oral intake related to inability to eat (pt sedated and ventilated) as evidenced by NPO status.  GOAL:   Provide needs based on ASPEN/SCCM guidelines  MONITOR:   Vent status, Labs, Weight trends, TF tolerance, I & O's, Skin  REASON FOR ASSESSMENT:   Ventilator    ASSESSMENT:   73 y/o female with h/o hypothyroidism, HLD, anxiety, HTN, GERD, SVT, CHF, alpha-gal and recurrent angioedema who is admitted with severe angioedema requiring transnasal endotracheal intubation 10/10.  Pt sedated and ventilated. 60F NGT was able to be placed past the oropharynx but was unable to be advanced into the stomach after multiple attempts. Plan will be to try daily to place tube as swelling improves. Will initiate tube feeds once able. Pt's husband at bedside reports pt with good appetite and oral intake at baseline. Per chart, pt is weight stable at baseline. Plan is for possible tracheostomy next week. Pt may require PEG tube; will take it day by day. PEG tube was discussed with pt's husband at bedside. Pt with noted B12 and vitamin D deficiencies. Pt receiving IM B12 injections; will supplement vitamin D once able.   Medications reviewed and include: dexamethasone, colace, miralax, pepcid, propofol, B12  Labs reviewed: K 4.0 wnl, P 3.7 wnl, Mg 2.0 wnl B12- 191(L), Vitamin D- 24.95(L)-  9/10 Hgb 9.6(L), Hct 29.3(L) Cbgs- 150, 151, 152, 154 x 24 hrs AIC 6.2- 9/10  Patient is currently intubated on ventilator support MV: 7.2 L/min Temp (24hrs), Avg:98.3 F (36.8 C), Min:97 F (36.1 C), Max:99.1 F (37.3 C)  Propofol: 26.2 ml/hr- provides 692kcal/day   MAP- >67mmHg   UOP-   NUTRITION - FOCUSED PHYSICAL EXAM:  Flowsheet Row Most Recent Value  Orbital Region No depletion  Upper Arm Region No depletion  Thoracic and Lumbar Region No depletion  Buccal Region No depletion  Temple Region No depletion  Clavicle Bone Region No depletion  Clavicle and Acromion Bone Region No depletion  Scapular Bone Region No depletion  Dorsal Hand No depletion  Patellar Region No depletion  Anterior Thigh Region No depletion  Posterior Calf Region No depletion  Edema (RD Assessment) None  Hair Reviewed  Eyes Reviewed  Mouth Reviewed  Skin Reviewed  Nails Reviewed   Diet Order:   Diet Order             Diet NPO time specified  Diet effective now                  EDUCATION NEEDS:   No education needs have been identified at this time  Skin:  Skin Assessment: Reviewed RN Assessment  Last BM:  pta  Height:   Ht Readings from Last 1 Encounters:  02/02/23 5' 6.5" (1.689 m)    Weight:   Wt Readings from Last 1 Encounters:  02/01/23 97.5 kg    Ideal Body Weight:  60 kg  BMI:  Body mass index is 34.18 kg/m.  Estimated Nutritional Needs:   Kcal:  1072-1365kcal/day  Protein:  >120g/day  Fluid:  1.6-1.8L/day  Betsey Holiday MS, RD, LDN Please refer to [facility] for RD and/or RD on-call/weekend/after hours pager

## 2023-02-02 NOTE — Plan of Care (Signed)
  Problem: Clinical Measurements: Goal: Ability to maintain clinical measurements within normal limits will improve Outcome: Progressing Goal: Will remain free from infection Outcome: Progressing Goal: Cardiovascular complication will be avoided Outcome: Progressing   Problem: Elimination: Goal: Will not experience complications related to urinary retention Outcome: Progressing   Problem: Pain Managment: Goal: General experience of comfort will improve Outcome: Progressing   Problem: Safety: Goal: Ability to remain free from injury will improve Outcome: Progressing   Problem: Skin Integrity: Goal: Risk for impaired skin integrity will decrease Outcome: Progressing

## 2023-02-02 NOTE — Anesthesia Postprocedure Evaluation (Signed)
Anesthesia Post Note  Patient: Sharon [name]  Procedure(s) Performed: INTUBATION-ENDOTRACHEAL WITH TRACHEOSTOMY STANDBY  Patient location during evaluation: ICU Anesthesia Type: General Level of consciousness: sedated Respiratory status: patient remains intubated per anesthesia plan Cardiovascular status: stable Anesthetic complications: yes   Encounter Notable Events  Notable Event Outcome Phase Comment  Difficult to intubate - expected  Intraprocedure Filed from anesthesia note documentation.     Last Vitals:  Vitals:   02/02/23 0500 02/02/23 0600  BP: 113/62 (!) 111/59  Pulse: (!) 48 (!) 48  Resp: 16 16  Temp:    SpO2: 97% 95%    Last Pain:  Vitals:   02/02/23 0400  TempSrc: Oral                 [name]

## 2023-02-03 DIAGNOSIS — T783XXA Angioneurotic edema, initial encounter: Secondary | ICD-10-CM | POA: Diagnosis not present

## 2023-02-03 LAB — CBC
HCT: 27.3 % — ABNORMAL LOW (ref 36.0–46.0)
Hemoglobin: 8.6 g/dL — ABNORMAL LOW (ref 12.0–15.0)
MCH: 28.8 pg (ref 26.0–34.0)
MCHC: 31.5 g/dL (ref 30.0–36.0)
MCV: 91.3 fL (ref 80.0–100.0)
Platelets: 199 10*3/uL (ref 150–400)
RBC: 2.99 MIL/uL — ABNORMAL LOW (ref 3.87–5.11)
RDW: 15.1 % (ref 11.5–15.5)
WBC: 12.3 10*3/uL — ABNORMAL HIGH (ref 4.0–10.5)
nRBC: 0 % (ref 0.0–0.2)

## 2023-02-03 LAB — RENAL FUNCTION PANEL
Albumin: 3.4 g/dL — ABNORMAL LOW (ref 3.5–5.0)
Anion gap: 11 (ref 5–15)
BUN: 39 mg/dL — ABNORMAL HIGH (ref 8–23)
CO2: 24 mmol/L (ref 22–32)
Calcium: 8.9 mg/dL (ref 8.9–10.3)
Chloride: 105 mmol/L (ref 98–111)
Creatinine, Ser: 2.09 mg/dL — ABNORMAL HIGH (ref 0.44–1.00)
GFR, Estimated: 25 mL/min — ABNORMAL LOW (ref >60)
Glucose, Bld: 158 mg/dL — ABNORMAL HIGH (ref 70–99)
Phosphorus: 5.5 mg/dL — ABNORMAL HIGH (ref 2.5–4.6)
Potassium: 4.2 mmol/L (ref 3.5–5.1)
Sodium: 140 mmol/L (ref 135–145)

## 2023-02-03 MED ORDER — FAMOTIDINE IN NACL 20-0.9 MG/50ML-% IV SOLN
20.0000 mg | INTRAVENOUS | Status: DC
  Administered 2023-02-04 – 2023-02-09 (×6): 20 mg via INTRAVENOUS
  Filled 2023-02-03 (×6): qty 50

## 2023-02-03 MED ORDER — SODIUM CHLORIDE 0.9 % IV BOLUS
1000.0000 mL | Freq: Once | INTRAVENOUS | Status: AC
  Administered 2023-02-03: 1000 mL via INTRAVENOUS

## 2023-02-03 NOTE — Progress Notes (Signed)
NAME:  Sharon [name], MRN:  [id number], DOB:  [birth year], LOS: 2 ADMISSION DATE:  02/01/2023  CHIEF COMPLAINT:  acute resp failure from anioedema  BRIEF SYNOPSIS  History of Present Illness:   72 year old female with a history of recurrent angioedema, husband says she has been to the hospital least 14 times in the last year for angioedema.  She was intubated once for this.  She is not on an ACE inhibitor and not on an ARB drug   ENT called to bedside Patient in obvious distress no stridor noted however she has significant angioedema of the floor mouth and tongue with the tongue protruding from the mouth.  She is unable to phonate without significant difficulty.  External ears appear normal with anterior nose benign oral cavity as above.  Patient was decongested with Afrin spray.  A flexible fiberoptic laryngoscope was introduced through the right nostril.  Examination of the posterior pharynx showed some uvular edema.  The tongue base that there is significant swelling was also significant edema of the epiglottis the bilateral arytenoids.  The vocal folds were visualized.  The subglottis and trachea appeared normal.  Patient was taken to OR for emergent nasal intubation  Case Discussed with ENT Plan for [facility] next week   She has unsteady airway   Wound Check No wounds or pressure ulcers noted upon admission to ICU   Pertinent  Medical History  Previous h/o ANGIOEDEMA   Significant Hospital Events: Including procedures, antibiotic start and stop dates in addition to other pertinent events   10/10 admitted for severe resp failure for severe angioedema 10/11: remains intubated  10/12 remains intubated and sedated   Antimicrobials:   Antibiotics Given (last 72 hours)     None      Objective   Blood pressure 119/63, pulse (!) 50, temperature 98.6 F (37 C), temperature source Oral, resp. rate 15, height 5' 6.5" (1.689 m), weight 97.5 kg, SpO2 99%.    Vent Mode:  PRVC FiO2 (%):  [35 %-60 %] 40 % Set Rate:  [16 bmp] 16 bmp Vt Set:  [450 mL] 450 mL PEEP:  [5 cmH20] 5 cmH20   Intake/Output Summary (Last 24 hours) at 02/03/2023 0744 Last data filed at 02/03/2023 0645 Gross per 24 hour  Intake 1244.63 ml  Output 615 ml  Net 629.63 ml   Filed Weights   02/01/23 1222  Weight: 97.5 kg     REVIEW OF SYSTEMS  PATIENT IS UNABLE TO PROVIDE COMPLETE REVIEW OF SYSTEMS DUE TO SEVERE CRITICAL ILLNESS   PHYSICAL EXAMINATION:  GENERAL:critically ill appearing, +resp distress EYES: Pupils equal, round, reactive to light.  No scleral icterus.  MOUTH: Moist mucosal membrane. INTUBATED lips/tongue swelling improved NECK: Supple.  PULMONARY: Lungs clear to auscultation, +rhonchi, +wheezing CARDIOVASCULAR: S1 and S2.  Regular rate and rhythm GASTROINTESTINAL: Soft, nontender, -distended. Positive bowel sounds.  MUSCULOSKELETAL: No swelling, clubbing, or edema.  NEUROLOGIC: obtunded,sedated SKIN:normal, warm to touch, Capillary refill delayed  Pulses present bilaterally  ASSESSMENT AND PLAN SYNOPSIS  72 yo white female with multiple episodes of angioedema with acute laryngeal edema due to  angioedema leading to severe resp failure ANGIOEDEMA WORK UP HAS BEEN NEGATIVE    Severe ACUTE Hypoxic and Hypercapnic Respiratory Failure -continue Mechanical Ventilator support -Wean Fio2 and PEEP as tolerated -VAP/VENT bundle implementation - Wean PEEP & FiO2 as tolerated, maintain SpO2 > 88% - Head of bed elevated 30 degrees, VAP protocol in place - Plateau pressures less than 30 cm H20  -  Intermittent chest x-ray & ABG PRN - Ensure adequate pulmonary hygiene  -will NOT perform SAT/SBT    CARDIAC ICU monitoring   SEVERE ANGIOEDEMA PLAN FOR TRACH NEXT WEEK STEROIDS AND H2 blockers   Renal  -continue Foley Catheter-assess need -Avoid nephrotoxic agents -Follow urine output, BMP -Ensure adequate renal perfusion, optimize oxygenation -Renal dose  medications   Intake/Output Summary (Last 24 hours) at 02/03/2023 0745 Last data filed at 02/03/2023 0645 Gross per 24 hour  Intake 1244.63 ml  Output 615 ml  Net 629.63 ml    NEUROLOGY She remains comfortable Goal RASS -2 to -3    ENDO - ICU hypoglycemic\Hyperglycemia protocol -check FSBS per protocol   GI GI PROPHYLAXIS as indicated  NUTRITIONAL STATUS DIET-->NPO Constipation protocol as indicated   ELECTROLYTES -follow labs as needed -replace as needed -pharmacy consultation and following  ACUTE ANEMIA- TRANSFUSE AS NEEDED CONSIDER TRANSFUSION  IF HGB<7    Best practice (right click and "Reselect all SmartList Selections" daily)  Diet: Start tube feeds Pain/Anxiety/Delirium protocol (if indicated): Yes (RASS goal -3) VAP protocol (if indicated): Yes DVT prophylaxis: Lovenox GI prophylaxis: H2B Mobility:  bed rest  Code Status:  FULL Disposition:ICU  Labs   CBC: Recent Labs  Lab 02/01/23 1223 02/02/23 0306 02/03/23 0458  WBC 9.1 5.0 12.3*  NEUTROABS 5.7  --   --   HGB 11.0* 9.6* 8.6*  HCT 34.0* 29.3* 27.3*  MCV 89.9 86.9 91.3  PLT 278 227 199    Basic Metabolic Panel: Recent Labs  Lab 02/01/23 1223 02/02/23 0306 02/03/23 0458  NA 133* 139 140  K 3.1* 4.0 4.2  CL 97* 102 105  CO2 25 23 24   GLUCOSE 130* 158* 158*  BUN 20 20 39*  CREATININE 1.00 0.94 2.09*  CALCIUM 9.4 9.3 8.9  MG 1.7 2.0  --   PHOS  --  3.7 5.5*   GFR: Estimated Creatinine Clearance: 28.9 mL/min (A) (by C-G formula based on SCr of 2.09 mg/dL (H)). Recent Labs  Lab 02/01/23 1223 02/02/23 0306 02/03/23 0458  WBC 9.1 5.0 12.3*    Liver Function Tests: Recent Labs  Lab 02/01/23 1223 02/02/23 0306 02/03/23 0458  AST 23  --   --   ALT 11  --   --   ALKPHOS 71  --   --   BILITOT 0.9  --   --   PROT 7.6  --   --   ALBUMIN 4.1 3.4* 3.4*   No results for input(s): "LIPASE", "AMYLASE" in the last 168 hours. No results for input(s): "AMMONIA" in the last  168 hours.  ABG    Component Value Date/Time   PHART 7.38 02/01/2023 1357   PCO2ART 46 02/01/2023 1357   PO2ART 108 02/01/2023 1357   HCO3 27.2 02/01/2023 1357   ACIDBASEDEF 1.4 04/17/2021 1817   O2SAT 98.8 02/01/2023 1357     Coagulation Profile: No results for input(s): "INR", "PROTIME" in the last 168 hours.  Cardiac Enzymes: No results for input(s): "CKTOTAL", "CKMB", "CKMBINDEX", "TROPONINI" in the last 168 hours.  HbA1C: Hgb A1c MFr Bld  Date/Time Value Ref Range Status  01/02/2023 10:22 AM 6.2 4.6 - 6.5 % Final    Comment:    Glycemic Control Guidelines for People with Diabetes:Non Diabetic:  <6%Goal of Therapy: <7%Additional Action Suggested:  >8%   12/12/2021 09:06 AM 5.7 4.6 - 6.5 % Final    Comment:    Glycemic Control Guidelines for People with Diabetes:Non Diabetic:  <6%Goal of Therapy: <7%Additional  Action Suggested:  >8%    CBG: Recent Labs  Lab 02/01/23 1333 02/01/23 1955 02/01/23 2304 02/02/23 0418  GLUCAP 154* 152* 151* 150*   Past Medical History:  She,  has a past medical history of Angio-edema, Anxiety, Cataract, GERD (gastroesophageal reflux disease), Hyperlipidemia, Hypertension, Hypothyroidism, and Vitamin D deficiency.   Surgical History:   Past Surgical History:  Procedure Laterality Date   CATARACT EXTRACTION W/ INTRAOCULAR LENS IMPLANT Right 12/29/2016   Dr. Mia Creek   CATARACT EXTRACTION W/ INTRAOCULAR LENS IMPLANT Left 01/15/2017   Dr. Mia Creek   COLONOSCOPY     LEEP     TUBAL LIGATION     VEIN LIGATION AND STRIPPING     x2     Social History:   reports that she quit smoking about 26 years ago. Her smoking use included cigarettes. She has never used smokeless tobacco. She reports current alcohol use of about 4.0 standard drinks of alcohol per week. She reports that she does not use drugs.   Family History:  Her family history includes Hypertension in her father and mother; Transient ischemic attack in her mother.  There is no history of Colon cancer, Esophageal cancer, Rectal cancer, or Stomach cancer.   Allergies Allergies  Allergen Reactions   Cozaar [Losartan] Swelling   Septra [Sulfamethoxazole-Trimethoprim] Anaphylaxis   Zinc Gelatin [Zinc] Anaphylaxis   Alpha-Gal    Amlodipine Besylate     REACTION: edema   Atorvastatin     REACTION: muscle pain   Beef Allergy    Cat Hair Extract    Dust Mite Extract    Neomycin-Bacitracin Zn-Polymyx     REACTION: rash Per pt not sure if from meds or not   Home Medications  Prior to Admission medications   Medication Sig Start Date End Date Taking? Authorizing Provider  albuterol (VENTOLIN HFA) 108 (90 Base) MCG/ACT inhaler TAKE 2 PUFFS BY MOUTH EVERY 6 HOURS AS NEEDED FOR WHEEZE OR SHORTNESS OF BREATH 06/13/22   Tower, Audrie Gallus, MD  aspirin 81 MG tablet Take 81 mg by mouth daily.    [provider]  benzonatate (TESSALON) 200 MG capsule Take 1 capsule (200 mg total) by mouth 3 (three) times daily as needed for cough. Swallow whole 06/23/22   Tower, Audrie Gallus, MD  calcium carbonate (TUMS EX) 750 MG chewable tablet Chew 2 tablets by mouth at bedtime.    [provider]  Cholecalciferol (VITAMIN D) 1000 UNITS capsule Take 2,000 Units by mouth daily.    [provider]  cyanocobalamin (VITAMIN B12) 1000 MCG tablet Take 1,000 mcg by mouth daily.    [provider]  diphenhydrAMINE (BENADRYL ALLERGY) 25 MG tablet Take 1 tablet (25 mg total) by mouth every 6 (six) hours as needed. 02/15/22   Willeen Niece, MD  EPINEPHrine 0.3 mg/0.3 mL IJ SOAJ injection Inject 0.3 mg into the muscle as needed for anaphylaxis. 04/27/21   Tower, Audrie Gallus, MD  ezetimibe (ZETIA) 10 MG tablet TAKE 1 TABLET BY MOUTH EVERY DAY 08/29/22   Antonieta Iba, MD  famotidine (PEPCID) 20 MG tablet Take 1 tablet (20 mg total) by mouth 2 (two) times daily. 08/15/22   Irean Hong, MD  fexofenadine (ALLEGRA) 180 MG tablet Take 180 mg by mouth daily. 05/11/22    [provider]  fluticasone (FLONASE) 50 MCG/ACT nasal spray USE 2 SPRAYS IN BOTH NOSTRILS  DAILY 05/31/22   Tower, Audrie Gallus, MD  folic acid (FOLVITE) 1 MG tablet Take 1  tablet (1 mg total) by mouth daily. 02/16/22   Willeen Niece, MD  hydrochlorothiazide (HYDRODIURIL) 25 MG tablet TAKE 1 TABLET (25 MG TOTAL) BY MOUTH DAILY. 12/26/22   Tower, Audrie Gallus, MD  levocetirizine (XYZAL) 5 MG tablet Take 5 mg by mouth daily. 05/10/22   [provider]  levothyroxine (SYNTHROID) 50 MCG tablet TAKE 1 TABLET BY MOUTH DAILY  BEFORE BREAKFAST 11/09/21   Tower, Audrie Gallus, MD  metoprolol tartrate (LOPRESSOR) 25 MG tablet Take 1 tablet (25 mg total) by mouth 2 (two) times daily. 01/31/22   Furth, Cadence H, PA-C  oxybutynin (DITROPAN-XL) 10 MG 24 hr tablet Take one tablet am and pm 04/04/22   Michiel Cowboy A, PA-C  pantoprazole (PROTONIX) 40 MG tablet TAKE 1 TABLET BY MOUTH DAILY 11/09/21   Tower, Audrie Gallus, MD  rosuvastatin (CRESTOR) 20 MG tablet TAKE 1 TABLET BY MOUTH DAILY 12/08/22   Tower, Audrie Gallus, MD  sertraline (ZOLOFT) 100 MG tablet TAKE 1 TABLET BY MOUTH DAILY 11/09/21   Tower, Audrie Gallus, MD  thiamine (VITAMIN B-1) 100 MG tablet Take 1 tablet (100 mg total) by mouth daily. 02/16/22   Willeen Niece, MD      DVT/GI PRX  assessed I Assessed the need for Labs I Assessed the need for Foley I Assessed the need for Central Venous Line Family Discussion when available I Assessed the need for Mobilization I made an Assessment of medications to be adjusted accordingly Safety Risk assessment completed  CASE DISCUSSED IN MULTIDISCIPLINARY ROUNDS WITH ICU TEAM   FAMILY/HUSBAND REQUESTS THAT PATIENT MAKE DECISION ON TRACH, HE WANTS PATIENT TO MAKE DECISION THIS IS NOT IDEAL SCENARIO, WE WILL NEED TO HAVE DISCUSSION WITH ENT, ICU TEAM AND HUSBAND  Critical Care Time devoted to patient care services described in this note is 55 minutes.  Critical care was necessary to treat /prevent imminent and  life-threatening deterioration. Overall, patient is critically ill, prognosis is guarded.  Patient with Multiorgan failure and at high risk for cardiac arrest and death.    Lucie Leather, M.D.  [facility]  Medical Director [facility] Medical Director [facility] Cardio-Pulmonary Department

## 2023-02-03 NOTE — Progress Notes (Signed)
PHARMACY CONSULT NOTE  Pharmacy Consult for Electrolyte Monitoring and Replacement   Recent Labs: Potassium (mmol/L)  Date Value  02/03/2023 4.2   Magnesium (mg/dL)  Date Value  02/72/5366 2.0   Calcium (mg/dL)  Date Value  44/06/4740 8.9   Albumin (g/dL)  Date Value  59/56/3875 3.4 (L)   Phosphorus (mg/dL)  Date Value  64/33/2951 5.5 (H)   Sodium (mmol/L)  Date Value  02/03/2023 140     Assessment: 72 year old female with history of recurrent angioedema admitted to CCU after recurrent angioedema episode. Patient is ventilated and sedated on fentanyl infusion and propofol.  Goal of Therapy:  Electrolytes within normal limits  Plan:  No replacement warranted Follow up electrolytes tomorrow AM  Bettey Costa, PharmD Clinical Pharmacist 02/03/2023 8:37 AM

## 2023-02-03 NOTE — Progress Notes (Signed)
..  02/03/2023 10:46 AM  Sharon [name] [id number]  Post-Op Day 2    Temp:  [98 F (36.7 C)-99.1 F (37.3 C)] 98 F (36.7 C) (10/12 0800) Pulse Rate:  [44-109] 51 (10/12 1000) Resp:  [13-21] 13 (10/12 1000) BP: (77-141)/(47-72) 137/67 (10/12 0900) SpO2:  [50 %-100 %] 96 % (10/12 1000) FiO2 (%):  [35 %-60 %] 35 % (10/12 0800),     Intake/Output Summary (Last 24 hours) at 02/03/2023 1046 Last data filed at 02/03/2023 0900 Gross per 24 hour  Intake 2041.6 ml  Output 750 ml  Net 1291.6 ml    Results for orders placed or performed during the hospital encounter of 02/01/23 (from the past 24 hour(s))  CBC     Status: Abnormal   Collection Time: 02/03/23  4:58 AM  Result Value Ref Range   WBC 12.3 (H) 4.0 - 10.5 K/uL   RBC 2.99 (L) 3.87 - 5.11 MIL/uL   Hemoglobin 8.6 (L) 12.0 - 15.0 g/dL   HCT 78.2 (L) 95.6 - 21.3 %   MCV 91.3 80.0 - 100.0 fL   MCH 28.8 26.0 - 34.0 pg   MCHC 31.5 30.0 - 36.0 g/dL   RDW 08.6 57.8 - 46.9 %   Platelets 199 150 - 400 K/uL   nRBC 0.0 0.0 - 0.2 %  Renal function panel     Status: Abnormal   Collection Time: 02/03/23  4:58 AM  Result Value Ref Range   Sodium 140 135 - 145 mmol/L   Potassium 4.2 3.5 - 5.1 mmol/L   Chloride 105 98 - 111 mmol/L   CO2 24 22 - 32 mmol/L   Glucose, Bld 158 (H) 70 - 99 mg/dL   BUN 39 (H) 8 - 23 mg/dL   Creatinine, Ser 6.29 (H) 0.44 - 1.00 mg/dL   Calcium 8.9 8.9 - 52.8 mg/dL   Phosphorus 5.5 (H) 2.5 - 4.6 mg/dL   Albumin 3.4 (L) 3.5 - 5.0 g/dL   GFR, Estimated 25 (L) >60 mL/min   Anion gap 11 5 - 15    SUBJECTIVE:  No acute events overnight  OBJECTIVE:  GEN-  obese female sedated with limited response NOSE-  Nasal ETT in place and secure.  No seen necrosis of right ala.  Tape has been changed since yesterday. OC/OP-  Improved floor of mouth and tongue edema.  Patient closes mouth with attempted palpation of floor of mouth. NECK-  limited landmarks  IMPRESSION:  s/p emergent Nasotracheal intubation due to  airway compromise from angioedema from unknown source  PLAN:  Improved edema.  Continues to be dangerous airway given some persistent swelling.  Husband yesterday had wished to have patient awakened/extubated to make call on tracheostomy decision herself.  Discussed risks associated with this including death from loss of airway if swelling recurs like it has before.  Patient's husband demonstrated understanding at last discussion and will continue to follow.  Patient is on the list for tentative tracheostomy tube next week if swelling does not resolve.  Sharon [name] 02/03/2023, 10:46 AM

## 2023-02-03 NOTE — Plan of Care (Signed)
Problem: Clinical Measurements: Goal: Will remain free from infection Outcome: Progressing Goal: Respiratory complications will improve Outcome: Progressing Goal: Cardiovascular complication will be avoided Outcome: Progressing   Problem: Pain Managment: Goal: General experience of comfort will improve Outcome: Progressing   Problem: Safety: Goal: Ability to remain free from injury will improve Outcome: Progressing   Problem: Skin Integrity: Goal: Risk for impaired skin integrity will decrease Outcome: Progressing

## 2023-02-04 ENCOUNTER — Inpatient Hospital Stay: Payer: Medicare Other

## 2023-02-04 ENCOUNTER — Other Ambulatory Visit: Payer: Self-pay

## 2023-02-04 DIAGNOSIS — T783XXA Angioneurotic edema, initial encounter: Secondary | ICD-10-CM | POA: Diagnosis not present

## 2023-02-04 LAB — PROTIME-INR
INR: 0.8 (ref 0.8–1.2)
Prothrombin Time: 10.9 s — ABNORMAL LOW (ref 11.4–15.2)

## 2023-02-04 LAB — CBC
HCT: 27.7 % — ABNORMAL LOW (ref 36.0–46.0)
Hemoglobin: 8.8 g/dL — ABNORMAL LOW (ref 12.0–15.0)
MCH: 28.7 pg (ref 26.0–34.0)
MCHC: 31.8 g/dL (ref 30.0–36.0)
MCV: 90.2 fL (ref 80.0–100.0)
Platelets: 185 10*3/uL (ref 150–400)
RBC: 3.07 MIL/uL — ABNORMAL LOW (ref 3.87–5.11)
RDW: 15.3 % (ref 11.5–15.5)
WBC: 9.4 10*3/uL (ref 4.0–10.5)
nRBC: 0 % (ref 0.0–0.2)

## 2023-02-04 LAB — RENAL FUNCTION PANEL
Albumin: 2.9 g/dL — ABNORMAL LOW (ref 3.5–5.0)
Anion gap: 9 (ref 5–15)
BUN: 35 mg/dL — ABNORMAL HIGH (ref 8–23)
CO2: 23 mmol/L (ref 22–32)
Calcium: 8.3 mg/dL — ABNORMAL LOW (ref 8.9–10.3)
Chloride: 108 mmol/L (ref 98–111)
Creatinine, Ser: 1.13 mg/dL — ABNORMAL HIGH (ref 0.44–1.00)
GFR, Estimated: 52 mL/min — ABNORMAL LOW (ref >60)
Glucose, Bld: 153 mg/dL — ABNORMAL HIGH (ref 70–99)
Phosphorus: 3.4 mg/dL (ref 2.5–4.6)
Potassium: 3.7 mmol/L (ref 3.5–5.1)
Sodium: 140 mmol/L (ref 135–145)

## 2023-02-04 LAB — MAGNESIUM: Magnesium: 2.3 mg/dL (ref 1.7–2.4)

## 2023-02-04 MED ORDER — SODIUM CHLORIDE 0.9% FLUSH: 10.0000 mL | INTRAVENOUS | Status: DC | PRN

## 2023-02-04 MED ORDER — SODIUM CHLORIDE 0.9% FLUSH
10.0000 mL | Freq: Two times a day (BID) | INTRAVENOUS | Status: DC
  Administered 2023-02-04 – 2023-02-07 (×6): 10 mL
  Administered 2023-02-07: 20 mL
  Administered 2023-02-08 – 2023-02-09 (×3): 10 mL

## 2023-02-04 MED ORDER — STERILE WATER FOR INJECTION IJ SOLN
INTRAMUSCULAR | Status: AC
  Administered 2023-02-04: 10 mL
  Filled 2023-02-04: qty 10

## 2023-02-04 MED ORDER — VECURONIUM BROMIDE 10 MG IV SOLR: 10.0000 mg | Freq: Once | INTRAVENOUS | Status: AC

## 2023-02-04 MED ORDER — MIDAZOLAM HCL 2 MG/2ML IJ SOLN
4.0000 mg | Freq: Once | INTRAMUSCULAR | Status: AC
  Administered 2023-02-04: 4 mg via INTRAVENOUS

## 2023-02-04 MED ORDER — VECURONIUM BROMIDE 10 MG IV SOLR
INTRAVENOUS | Status: AC
  Administered 2023-02-04: 10 mg via INTRAVENOUS
  Filled 2023-02-04: qty 10

## 2023-02-04 NOTE — Progress Notes (Signed)
..02/04/2023 10:01 AM  Sharon [name] [id number]  Post-Op Day 3    Temp:  [98.1 F (36.7 C)-99.9 F (37.7 C)] 98.1 F (36.7 C) (10/13 0730) Pulse Rate:  [43-104] 44 (10/13 0700) Resp:  [14-20] 16 (10/13 0700) BP: (105-144)/(52-89) 144/66 (10/13 0730) SpO2:  [93 %-98 %] 97 % (10/13 0700) FiO2 (%):  [35 %] 35 % (10/13 0752),     Intake/Output Summary (Last 24 hours) at 02/04/2023 1001 Last data filed at 02/04/2023 0600 Gross per 24 hour  Intake 963.98 ml  Output 1465 ml  Net -501.02 ml    Results for orders placed or performed during the hospital encounter of 02/01/23 (from the past 24 hour(s))  Culture, Respiratory w Gram Stain     Status: None (Preliminary result)   Collection Time: 02/03/23  7:43 PM   Specimen: Tracheal Aspirate; Respiratory  Result Value Ref Range   Specimen Description      TRACHEAL ASPIRATE Performed at [facility], [address] [place], [place] [address]    Special Requests      NONE Performed at [facility], [address] [place], [place] [address]    Gram Stain      FEW WBC PRESENT,BOTH PMN AND MONONUCLEAR FEW GRAM NEGATIVE RODS RARE GRAM POSITIVE COCCI IN PAIRS Performed at [facility], [address] [place], [place] [address]    Culture PENDING    Report Status PENDING   CBC     Status: Abnormal   Collection Time: 02/04/23  3:31 AM  Result Value Ref Range   WBC 9.4 4.0 - 10.5 K/uL   RBC 3.07 (L) 3.87 - 5.11 MIL/uL   Hemoglobin 8.8 (L) 12.0 - 15.0 g/dL   HCT 44.0 (L) 34.7 - 42.5 %   MCV 90.2 80.0 - 100.0 fL   MCH 28.7 26.0 - 34.0 pg   MCHC 31.8 30.0 - 36.0 g/dL   RDW 95.6 38.7 - 56.4 %   Platelets 185 150 - 400 K/uL   nRBC 0.0 0.0 - 0.2 %  Renal function panel     Status: Abnormal   Collection Time: 02/04/23  3:31 AM  Result Value Ref Range   Sodium 140 135 - 145 mmol/L   Potassium 3.7 3.5 - 5.1 mmol/L   Chloride 108 98 - 111 mmol/L   CO2 23 22 - 32 mmol/L   Glucose, Bld 153 (H) 70 - 99  mg/dL   BUN 35 (H) 8 - 23 mg/dL   Creatinine, Ser 3.32 (H) 0.44 - 1.00 mg/dL   Calcium 8.3 (L) 8.9 - 10.3 mg/dL   Phosphorus 3.4 2.5 - 4.6 mg/dL   Albumin 2.9 (L) 3.5 - 5.0 g/dL   GFR, Estimated 52 (L) >60 mL/min   Anion gap 9 5 - 15  Magnesium     Status: None   Collection Time: 02/04/23  3:31 AM  Result Value Ref Range   Magnesium 2.3 1.7 - 2.4 mg/dL    SUBJECTIVE:  no acute events.    OBJECTIVE:  GEN-  sedated and non-responsive to name NOSE-  ETT in place and secure.  Lateral right ala evaluated and no obvious signs of necrosis from tube. OC/OP-  continued improvement of floor of mouth edema and tongue edema NECK- obese with limited landmarks  IMPRESSION/PLAN:  Angioedema s/p emergent nasotracheal intubation POD#3.  Improved swelling but this is her 14th episode of angioedema since 04/17/2021.  Evaluated by Allergy and Immunology and diagnosed with alpha gal but  episodes have been progressive even after avoiding mammalian meat products.  Per husband patient is still drinking milk.  Recommendation given recurrent nature and severity of last event is placement of tracheostomy tube for airway protection until these episodes can be prevented.  Recommend evaluation as outpatient with Dr. Rada Hay at [facility] in future.  Can consider placement of small uncuffed tracheostomy tube at time of procedure if neck allows for placement that could eventually be capped off majority of time but for airway protection given recurrent swelling.  Dr. Belia Heman and family to meet this afternoon for discussion.  Will be happy to discuss any questions as well.   Sharon [name] 02/04/2023, 10:01 AM

## 2023-02-04 NOTE — Progress Notes (Signed)
Peripherally Inserted Central Catheter Placement  The IV Nurse has discussed with the patient and/or persons authorized to consent for the patient, the purpose of this procedure and the potential benefits and risks involved with this procedure.  The benefits include less needle sticks, lab draws from the catheter, and the patient may be discharged home with the catheter. Risks include, but not limited to, infection, bleeding, blood clot (thrombus formation), and puncture of an artery; nerve damage and irregular heartbeat and possibility to perform a PICC exchange if needed/ordered by physician.  Alternatives to this procedure were also discussed.  Bard Power PICC patient education guide, fact sheet on infection prevention and patient information card has been provided to patient /or left at bedside.  Obtained order for medical necessity per Webb Silversmith, NP.  PICC Placement Documentation  PICC Triple Lumen 02/04/23 Left Basilic 43 cm 2 cm (Active)  Indication for Insertion or Continuance of Line Limited venous access - need for IV therapy >5 days (PICC only) 02/04/23 2100  Exposed Catheter (cm) 2 cm 02/04/23 2100  Site Assessment Clean, Dry, Intact 02/04/23 2100  Lumen #1 Status Flushed;Saline locked;Blood return noted 02/04/23 2100  Lumen #2 Status Flushed;Saline locked;Blood return noted 02/04/23 2100  Lumen #3 Status Flushed;Saline locked;Blood return noted 02/04/23 2100  Dressing Type Transparent;Securing device 02/04/23 2100  Dressing Status Antimicrobial disc in place 02/04/23 2100  Line Care Connections checked and tightened 02/04/23 2100  Line Adjustment (NICU/IV Team Only) No 02/04/23 2100  Dressing Intervention New dressing;Adhesive placed at insertion site (IV team only) 02/04/23 2100  Dressing Change Due 02/11/23 02/04/23 2100       Elenore Paddy 02/04/2023, 9:11 PM

## 2023-02-04 NOTE — Progress Notes (Signed)
NAME:  Sharon [name], MRN:  [id number], DOB:  [birth year], LOS: 3 ADMISSION DATE:  02/01/2023  CHIEF COMPLAINT:  acute resp failure from anioedema   History of Present Illness:   72 year old female with a history of recurrent angioedema, husband says she has been to the hospital least 14 times in the last year for angioedema.  She was intubated once for this.  She is not on an ACE inhibitor and not on an ARB drug   ENT called to bedside Patient in obvious distress no stridor noted however she has significant angioedema of the floor mouth and tongue with the tongue protruding from the mouth.  She is unable to phonate without significant difficulty.  External ears appear normal with anterior nose benign oral cavity as above.  Patient was decongested with Afrin spray.  A flexible fiberoptic laryngoscope was introduced through the right nostril.  Examination of the posterior pharynx showed some uvular edema.  The tongue base that there is significant swelling was also significant edema of the epiglottis the bilateral arytenoids.  The vocal folds were visualized.  The subglottis and trachea appeared normal.  Patient was taken to OR for emergent nasal intubation  Case Discussed with ENT Plan for [facility] next week   She has unsteady airway   Wound Check No wounds or pressure ulcers noted upon admission to ICU   Pertinent  Medical History  Previous h/o ANGIOEDEMA   Significant Hospital Events: Including procedures, antibiotic start and stop dates in addition to other pertinent events   10/10 admitted for severe resp failure for severe angioedema 10/11: remains intubated  10/12 remains intubated and sedated 10/13 remains nasally intubated and sedated   Antimicrobials:   Antibiotics Given (last 72 hours)     None      Objective   Blood pressure (!) 140/62, pulse (!) 43, temperature 98.8 F (37.1 C), temperature source Oral, resp. rate 16, height 5' 6.5" (1.689 m), weight 97.5  kg, SpO2 98%.    Vent Mode: PRVC FiO2 (%):  [35 %] 35 % Set Rate:  [16 bmp] 16 bmp Vt Set:  [450 mL] 450 mL PEEP:  [5 cmH20] 5 cmH20 Plateau Pressure:  [10 cmH20-19 cmH20] 19 cmH20   Intake/Output Summary (Last 24 hours) at 02/04/2023 0710 Last data filed at 02/04/2023 0600 Gross per 24 hour  Intake 2015.04 ml  Output 1600 ml  Net 415.04 ml   Filed Weights   02/01/23 1222  Weight: 97.5 kg     REVIEW OF SYSTEMS  PATIENT IS UNABLE TO PROVIDE COMPLETE REVIEW OF SYSTEMS DUE TO SEVERE CRITICAL ILLNESS   PHYSICAL EXAMINATION:  GENERAL:critically ill appearing, +resp distress EYES: Pupils equal, round, reactive to light.  No scleral icterus.  MOUTH: Moist mucosal membrane. Nasally intubated  lips/tongue swelling improved NECK: Supple.  PULMONARY: Lungs clear to auscultation, +rhonchi, +wheezing CARDIOVASCULAR: S1 and S2.  Regular rate and rhythm  Wound Check No wounds or pressure ulcers noted upon admission to ICU   ASSESSMENT AND PLAN SYNOPSIS  72 yo white female with multiple episodes of angioedema with acute laryngeal edema due to  angioedema leading to severe resp failure ANGIOEDEMA WORK UP HAS BEEN NEGATIVE   Severe ACUTE Hypoxic and Hypercapnic Respiratory Failure -continue Mechanical Ventilator support -Wean Fio2 and PEEP as tolerated -VAP/VENT bundle implementation - Wean PEEP & FiO2 as tolerated, maintain SpO2 > 88% - Head of bed elevated 30 degrees, VAP protocol in place - Plateau pressures less than 30 cm H20  -  Intermittent chest x-ray & ABG PRN - Ensure adequate pulmonary hygiene  -will NOT perform SAT/SBT  Patient needs TRACH to survive Plan to meet with family today at 230PM    CARDIAC ICU monitoring   SEVERE ANGIOEDEMA PLAN FOR TRACH NEXT WEEK STEROIDS AND H2 blockers  ACUTE KIDNEY INJURY/Renal Failure -continue Foley Catheter-assess need -Avoid nephrotoxic agents -Follow urine output, BMP -Ensure adequate renal perfusion, optimize  oxygenation -Renal dose medications   Intake/Output Summary (Last 24 hours) at 02/04/2023 0865 Last data filed at 02/04/2023 0600 Gross per 24 hour  Intake 2015.04 ml  Output 1600 ml  Net 415.04 ml      Latest Ref Rng & Units 02/04/2023    3:31 AM 02/03/2023    4:58 AM 02/02/2023    3:06 AM  BMP  Glucose 70 - 99 mg/dL 784  696  295   BUN 8 - 23 mg/dL 35  39  20   Creatinine 0.44 - 1.00 mg/dL 2.84  1.32  4.40   Sodium 135 - 145 mmol/L 140  140  139   Potassium 3.5 - 5.1 mmol/L 3.7  4.2  4.0   Chloride 98 - 111 mmol/L 108  105  102   CO2 22 - 32 mmol/L 23  24  23    Calcium 8.9 - 10.3 mg/dL 8.3  8.9  9.3       NEUROLOGY She remains comfortable Goal RASS -2 to -3   ENDO - ICU hypoglycemic\Hyperglycemia protocol -check FSBS per protocol   GI GI PROPHYLAXIS as indicated  NUTRITIONAL STATUS DIET-->TF's as tolerated Constipation protocol as indicated   ELECTROLYTES -follow labs as needed -replace as needed -pharmacy consultation and following  ACUTE ANEMIA- TRANSFUSE AS NEEDED CONSIDER TRANSFUSION  IF HGB<7 DVT PRX with TED/SCD's ONLY     Best practice (right click and "Reselect all SmartList Selections" daily)  Diet: Start tube feeds Pain/Anxiety/Delirium protocol (if indicated): Yes (RASS goal -3) VAP protocol (if indicated): Yes DVT prophylaxis: Lovenox GI prophylaxis: H2B Mobility:  bed rest  Code Status:  FULL Disposition:ICU  Labs   CBC: Recent Labs  Lab 02/01/23 1223 02/02/23 0306 02/03/23 0458 02/04/23 0331  WBC 9.1 5.0 12.3* 9.4  NEUTROABS 5.7  --   --   --   HGB 11.0* 9.6* 8.6* 8.8*  HCT 34.0* 29.3* 27.3* 27.7*  MCV 89.9 86.9 91.3 90.2  PLT 278 227 199 185    Basic Metabolic Panel: Recent Labs  Lab 02/01/23 1223 02/02/23 0306 02/03/23 0458 02/04/23 0331  NA 133* 139 140 140  K 3.1* 4.0 4.2 3.7  CL 97* 102 105 108  CO2 25 23 24 23   GLUCOSE 130* 158* 158* 153*  BUN 20 20 39* 35*  CREATININE 1.00 0.94 2.09* 1.13*   CALCIUM 9.4 9.3 8.9 8.3*  MG 1.7 2.0  --  2.3  PHOS  --  3.7 5.5* 3.4   GFR: Estimated Creatinine Clearance: 53.5 mL/min (A) (by C-G formula based on SCr of 1.13 mg/dL (H)). Recent Labs  Lab 02/01/23 1223 02/02/23 0306 02/03/23 0458 02/04/23 0331  WBC 9.1 5.0 12.3* 9.4    Liver Function Tests: Recent Labs  Lab 02/01/23 1223 02/02/23 0306 02/03/23 0458 02/04/23 0331  AST 23  --   --   --   ALT 11  --   --   --   ALKPHOS 71  --   --   --   BILITOT 0.9  --   --   --   PROT  7.6  --   --   --   ALBUMIN 4.1 3.4* 3.4* 2.9*   No results for input(s): "LIPASE", "AMYLASE" in the last 168 hours. No results for input(s): "AMMONIA" in the last 168 hours.  ABG    Component Value Date/Time   PHART 7.38 02/01/2023 1357   PCO2ART 46 02/01/2023 1357   PO2ART 108 02/01/2023 1357   HCO3 27.2 02/01/2023 1357   ACIDBASEDEF 1.4 04/17/2021 1817   O2SAT 98.8 02/01/2023 1357     Coagulation Profile: No results for input(s): "INR", "PROTIME" in the last 168 hours.  Cardiac Enzymes: No results for input(s): "CKTOTAL", "CKMB", "CKMBINDEX", "TROPONINI" in the last 168 hours.  HbA1C: Hgb A1c MFr Bld  Date/Time Value Ref Range Status  01/02/2023 10:22 AM 6.2 4.6 - 6.5 % Final    Comment:    Glycemic Control Guidelines for People with Diabetes:Non Diabetic:  <6%Goal of Therapy: <7%Additional Action Suggested:  >8%   12/12/2021 09:06 AM 5.7 4.6 - 6.5 % Final    Comment:    Glycemic Control Guidelines for People with Diabetes:Non Diabetic:  <6%Goal of Therapy: <7%Additional Action Suggested:  >8%    CBG: Recent Labs  Lab 02/01/23 1333 02/01/23 1955 02/01/23 2304 02/02/23 0418  GLUCAP 154* 152* 151* 150*   Past Medical History:  She,  has a past medical history of Angio-edema, Anxiety, Cataract, GERD (gastroesophageal reflux disease), Hyperlipidemia, Hypertension, Hypothyroidism, and Vitamin D deficiency.   Surgical History:   Past Surgical History:  Procedure Laterality Date    CATARACT EXTRACTION W/ INTRAOCULAR LENS IMPLANT Right 12/29/2016   Dr. Mia Creek   CATARACT EXTRACTION W/ INTRAOCULAR LENS IMPLANT Left 01/15/2017   Dr. Mia Creek   COLONOSCOPY     LEEP     TUBAL LIGATION     VEIN LIGATION AND STRIPPING     x2      DVT/GI PRX  assessed I Assessed the need for Labs I Assessed the need for Foley I Assessed the need for Central Venous Line Family Discussion when available I Assessed the need for Mobilization I made an Assessment of medications to be adjusted accordingly Safety Risk assessment completed  CASE DISCUSSED IN MULTIDISCIPLINARY ROUNDS WITH ICU TEAM     Critical Care Time devoted to patient care services described in this note is 55 minutes.  Critical care was necessary to treat /prevent imminent and life-threatening deterioration.    Lucie Leather, M.D.  [facility]  Medical Director [facility] Medical Director [facility] Cardio-Pulmonary Department

## 2023-02-04 NOTE — Progress Notes (Signed)
PHARMACY CONSULT NOTE  Pharmacy Consult for Electrolyte Monitoring and Replacement   Recent Labs: Potassium (mmol/L)  Date Value  02/04/2023 3.7   Magnesium (mg/dL)  Date Value  13/24/4010 2.3   Calcium (mg/dL)  Date Value  27/25/3664 8.3 (L)   Albumin (g/dL)  Date Value  40/34/7425 2.9 (L)   Phosphorus (mg/dL)  Date Value  95/63/8756 3.4   Sodium (mmol/L)  Date Value  02/04/2023 140     Assessment: 72 year old female with history of recurrent angioedema admitted to CCU after recurrent angioedema episode. Patient is ventilated and sedated on fentanyl infusion and propofol.  Goal of Therapy:  Electrolytes within normal limits  Plan:  No replacement warranted Follow up electrolytes tomorrow AM  Bettey Costa, PharmD Clinical Pharmacist 02/04/2023 8:46 AM

## 2023-02-04 NOTE — IPAL (Signed)
  Interdisciplinary Goals of Care Family Meeting   Date carried out: 02/04/2023  Location of the meeting: Conference room  Member's involved: Physician, Bedside Registered Nurse, and Family Member or next of kin    GOALS OF CARE DISCUSSION  The Clinical status was relayed to family in detail- Husband, Son and Daughter In Social worker  Updated and notified of patients medical condition- Explained to family course of therapy and the modalities    PATIENT REMAINS FULL CODE We discussed the best and safest course of action is top proceed with [facility] Alternative option is to take a risk for acute upper airway obstruction leading to cardiac arrest and death  Family understands the situation, they don't want to take any risks, they will proceed with [facility] ENT notified, written consent obtained and placed in chart   Family are satisfied with Plan of action and management. All questions answered  Additional CC time 25 mins   Rasha Ibe Santiago Glad, M.D.  [facility]  Medical Director [facility] Medical Director [facility] Cardio-Pulmonary Department

## 2023-02-05 ENCOUNTER — Encounter: Payer: Self-pay | Admitting: Unknown Physician Specialty

## 2023-02-05 DIAGNOSIS — T783XXA Angioneurotic edema, initial encounter: Secondary | ICD-10-CM | POA: Diagnosis not present

## 2023-02-05 LAB — CBC
HCT: 28.6 % — ABNORMAL LOW (ref 36.0–46.0)
Hemoglobin: 8.6 g/dL — ABNORMAL LOW (ref 12.0–15.0)
MCH: 28.2 pg (ref 26.0–34.0)
MCHC: 30.1 g/dL (ref 30.0–36.0)
MCV: 93.8 fL (ref 80.0–100.0)
Platelets: 200 10*3/uL (ref 150–400)
RBC: 3.05 MIL/uL — ABNORMAL LOW (ref 3.87–5.11)
RDW: 14.8 % (ref 11.5–15.5)
WBC: 8.2 10*3/uL (ref 4.0–10.5)
nRBC: 0.2 % (ref 0.0–0.2)

## 2023-02-05 LAB — RENAL FUNCTION PANEL
Albumin: 2.9 g/dL — ABNORMAL LOW (ref 3.5–5.0)
Anion gap: 11 (ref 5–15)
BUN: 30 mg/dL — ABNORMAL HIGH (ref 8–23)
CO2: 24 mmol/L (ref 22–32)
Calcium: 8.5 mg/dL — ABNORMAL LOW (ref 8.9–10.3)
Chloride: 108 mmol/L (ref 98–111)
Creatinine, Ser: 0.84 mg/dL (ref 0.44–1.00)
GFR, Estimated: >60 mL/min (ref >60)
Glucose, Bld: 146 mg/dL — ABNORMAL HIGH (ref 70–99)
Phosphorus: 2.6 mg/dL (ref 2.5–4.6)
Potassium: 3.9 mmol/L (ref 3.5–5.1)
Sodium: 143 mmol/L (ref 135–145)

## 2023-02-05 LAB — TRIGLYCERIDES: Triglycerides: 241 mg/dL — ABNORMAL HIGH (ref <150)

## 2023-02-05 LAB — MAGNESIUM: Magnesium: 2.6 mg/dL — ABNORMAL HIGH (ref 1.7–2.4)

## 2023-02-05 MED ORDER — HYDRALAZINE HCL 20 MG/ML IJ SOLN
10.0000 mg | Freq: Four times a day (QID) | INTRAMUSCULAR | Status: DC | PRN
  Administered 2023-02-05 – 2023-02-07 (×4): 10 mg via INTRAVENOUS
  Filled 2023-02-05 (×4): qty 1

## 2023-02-05 NOTE — Plan of Care (Signed)

## 2023-02-05 NOTE — Progress Notes (Signed)
PHARMACY CONSULT NOTE  Pharmacy Consult for Electrolyte Monitoring and Replacement   Recent Labs: Potassium (mmol/L)  Date Value  02/05/2023 3.9   Magnesium (mg/dL)  Date Value  16/01/9603 2.6 (H)   Calcium (mg/dL)  Date Value  54/12/8117 8.5 (L)   Albumin (g/dL)  Date Value  14/78/2956 2.9 (L)   Phosphorus (mg/dL)  Date Value  21/30/8657 2.6   Sodium (mmol/L)  Date Value  02/05/2023 143     Assessment: 72 year old female with history of recurrent angioedema admitted to CCU after recurrent angioedema episode. Patient is ventilated and sedated on fentanyl infusion and propofol.  Goal of Therapy:  Electrolytes within normal limits  Plan:  No replacement warranted Follow up electrolytes tomorrow AM  Lowella Bandy, PharmD Clinical Pharmacist 02/05/2023 7:05 AM

## 2023-02-05 NOTE — Progress Notes (Signed)
NAME:  Sharon [name], MRN:  [id number], DOB:  October 18, 1950, LOS: 4 ADMISSION DATE:  02/01/2023  CHIEF COMPLAINT:  acute resp failure from anioedema   History of Present Illness:   73 year old female with a history of recurrent angioedema, husband says she has been to the hospital least 14 times in the last year for angioedema.  She was intubated once for this.  She is not on an ACE inhibitor and not on an ARB drug   ENT called to bedside Patient in obvious distress no stridor noted however she has significant angioedema of the floor mouth and tongue with the tongue protruding from the mouth.  She is unable to phonate without significant difficulty.  External ears appear normal with anterior nose benign oral cavity as above.  Patient was decongested with Afrin spray.  A flexible fiberoptic laryngoscope was introduced through the right nostril.  Examination of the posterior pharynx showed some uvular edema.  The tongue base that there is significant swelling was also significant edema of the epiglottis the bilateral arytenoids.  The vocal folds were visualized.  The subglottis and trachea appeared normal.  Patient was taken to OR for emergent nasal intubation  Case Discussed with ENT Plan for [facility] next week   She has unsteady airway   Wound Check No wounds or pressure ulcers noted upon admission to ICU   Pertinent  Medical History  Previous h/o ANGIOEDEMA   Significant Hospital Events: Including procedures, antibiotic start and stop dates in addition to other pertinent events   10/10 admitted for severe resp failure for severe angioedema 10/11: remains intubated  10/12 remains intubated and sedated 10/13 remains nasally intubated and sedated 10/14 proceed with [facility]   Antimicrobials:   Antibiotics Given (last 72 hours)     None      Objective   Blood pressure (!) 151/64, pulse (!) 41, temperature 98.8 F (37.1 C), temperature source Oral, resp. rate 16, height 5' 6.5"  (1.689 m), weight 97.5 kg, SpO2 98%.    Vent Mode: PRVC FiO2 (%):  [35 %-100 %] 35 % Set Rate:  [16 bmp] 16 bmp Vt Set:  [450 mL] 450 mL PEEP:  [5 cmH20] 5 cmH20 Plateau Pressure:  [10 cmH20-20 cmH20] 18 cmH20   Intake/Output Summary (Last 24 hours) at 02/05/2023 2130 Last data filed at 02/05/2023 0600 Gross per 24 hour  Intake 1218.96 ml  Output 1010 ml  Net 208.96 ml   Filed Weights   02/01/23 1222  Weight: 97.5 kg     REVIEW OF SYSTEMS  PATIENT IS UNABLE TO PROVIDE COMPLETE REVIEW OF SYSTEMS DUE TO SEVERE CRITICAL ILLNESS   PHYSICAL EXAMINATION:  GENERAL:critically ill appearing, +resp distress EYES: Pupils equal, round, reactive to light.  No scleral icterus.  MOUTH: Nasally intubated  lips/tongue swelling improved NECK: Supple.  PULMONARY: Lungs clear to auscultation, +rhonchi, +wheezing CARDIOVASCULAR: S1 and S2.  Regular rate and rhythm GASTROINTESTINAL: Soft, nontender, -distended. Positive bowel sounds.  MUSCULOSKELETAL: No swelling, clubbing, or edema.  NEUROLOGIC: obtunded,sedated SKIN:normal, warm to touch, Capillary refill delayed  Pulses present bilaterally    No wounds or pressure ulcers noted upon admission to ICU   ASSESSMENT AND PLAN SYNOPSIS  72 yo white female with multiple episodes of angioedema with acute laryngeal edema due to  angioedema leading to severe resp failure ANGIOEDEMA WORK UP HAS BEEN NEGATIVE    Severe ACUTE Hypoxic and Hypercapnic Respiratory Failure -continue Mechanical Ventilator support -Wean Fio2 and PEEP as tolerated -VAP/VENT bundle implementation -  Wean PEEP & FiO2 as tolerated, maintain SpO2 > 88% - Head of bed elevated 30 degrees, VAP protocol in place - Plateau pressures less than 30 cm H20  - Intermittent chest x-ray & ABG PRN - Ensure adequate pulmonary hygiene  Patient needs TRACH to survive Family has consented TA cultures pending   CARDIAC ICU monitoring   SEVERE ANGIOEDEMA PLAN FOR TRACH THIS  WEEK CONSENT SIGNED STEROIDS AND H2 blockers   ACUTE KIDNEY INJURY/Renal Failure-improving -continue Foley Catheter-assess need -Avoid nephrotoxic agents -Follow urine output, BMP -Ensure adequate renal perfusion, optimize oxygenation -Renal dose medications   Intake/Output Summary (Last 24 hours) at 02/05/2023 0715 Last data filed at 02/05/2023 0600 Gross per 24 hour  Intake 1218.96 ml  Output 1010 ml  Net 208.96 ml      NEUROLOGY She remains comfortable Goal RASS -2 to -3   ENDO - ICU hypoglycemic\Hyperglycemia protocol -check FSBS per protocol   GI GI PROPHYLAXIS as indicated  NUTRITIONAL STATUS DIET-->NPO Constipation protocol as indicated   ELECTROLYTES -follow labs as needed -replace as needed -pharmacy consultation and following  ACUTE ANEMIA- TRANSFUSE AS NEEDED CONSIDER TRANSFUSION  IF HGB<7    Best practice (right click and "Reselect all SmartList Selections" daily)  Diet: Start tube feeds Pain/Anxiety/Delirium protocol (if indicated): Yes (RASS goal -3) VAP protocol (if indicated): Yes DVT prophylaxis: Lovenox GI prophylaxis: H2B Mobility:  bed rest  Code Status:  FULL Disposition:ICU  Labs   CBC: Recent Labs  Lab 02/01/23 1223 02/02/23 0306 02/03/23 0458 02/04/23 0331 02/05/23 0425  WBC 9.1 5.0 12.3* 9.4 8.2  NEUTROABS 5.7  --   --   --   --   HGB 11.0* 9.6* 8.6* 8.8* 8.6*  HCT 34.0* 29.3* 27.3* 27.7* 28.6*  MCV 89.9 86.9 91.3 90.2 93.8  PLT 278 227 199 185 200    Basic Metabolic Panel: Recent Labs  Lab 02/01/23 1223 02/02/23 0306 02/03/23 0458 02/04/23 0331 02/05/23 0425  NA 133* 139 140 140 143  K 3.1* 4.0 4.2 3.7 3.9  CL 97* 102 105 108 108  CO2 25 23 24 23 24   GLUCOSE 130* 158* 158* 153* 146*  BUN 20 20 39* 35* 30*  CREATININE 1.00 0.94 2.09* 1.13* 0.84  CALCIUM 9.4 9.3 8.9 8.3* 8.5*  MG 1.7 2.0  --  2.3 2.6*  PHOS  --  3.7 5.5* 3.4 2.6   GFR: Estimated Creatinine Clearance: 72 mL/min (by C-G formula  based on SCr of 0.84 mg/dL). Recent Labs  Lab 02/02/23 0306 02/03/23 0458 02/04/23 0331 02/05/23 0425  WBC 5.0 12.3* 9.4 8.2    Liver Function Tests: Recent Labs  Lab 02/01/23 1223 02/02/23 0306 02/03/23 0458 02/04/23 0331 02/05/23 0425  AST 23  --   --   --   --   ALT 11  --   --   --   --   ALKPHOS 71  --   --   --   --   BILITOT 0.9  --   --   --   --   PROT 7.6  --   --   --   --   ALBUMIN 4.1 3.4* 3.4* 2.9* 2.9*   No results for input(s): "LIPASE", "AMYLASE" in the last 168 hours. No results for input(s): "AMMONIA" in the last 168 hours.  ABG    Component Value Date/Time   PHART 7.38 02/01/2023 1357   PCO2ART 46 02/01/2023 1357   PO2ART 108 02/01/2023 1357   HCO3 27.2 02/01/2023  1357   ACIDBASEDEF 1.4 04/17/2021 1817   O2SAT 98.8 02/01/2023 1357     Coagulation Profile: Recent Labs  Lab 02/04/23 1045  INR 0.8    Cardiac Enzymes: No results for input(s): "CKTOTAL", "CKMB", "CKMBINDEX", "TROPONINI" in the last 168 hours.  HbA1C: Hgb A1c MFr Bld  Date/Time Value Ref Range Status  01/02/2023 10:22 AM 6.2 4.6 - 6.5 % Final    Comment:    Glycemic Control Guidelines for People with Diabetes:Non Diabetic:  <6%Goal of Therapy: <7%Additional Action Suggested:  >8%   12/12/2021 09:06 AM 5.7 4.6 - 6.5 % Final    Comment:    Glycemic Control Guidelines for People with Diabetes:Non Diabetic:  <6%Goal of Therapy: <7%Additional Action Suggested:  >8%    CBG: Recent Labs  Lab 02/01/23 1333 02/01/23 1955 02/01/23 2304 02/02/23 0418  GLUCAP 154* 152* 151* 150*   Past Medical History:  She,  has a past medical history of Angio-edema, Anxiety, Cataract, GERD (gastroesophageal reflux disease), Hyperlipidemia, Hypertension, Hypothyroidism, and Vitamin D deficiency.   Surgical History:   Past Surgical History:  Procedure Laterality Date   CATARACT EXTRACTION W/ INTRAOCULAR LENS IMPLANT Right 12/29/2016   Dr. Mia Creek   CATARACT EXTRACTION W/ INTRAOCULAR  LENS IMPLANT Left 01/15/2017   Dr. Mia Creek   COLONOSCOPY     LEEP     TUBAL LIGATION     VEIN LIGATION AND STRIPPING     x2      DVT/GI PRX  assessed I Assessed the need for Labs I Assessed the need for Foley I Assessed the need for Central Venous Line Family Discussion when available I Assessed the need for Mobilization I made an Assessment of medications to be adjusted accordingly Safety Risk assessment completed  CASE DISCUSSED IN MULTIDISCIPLINARY ROUNDS WITH ICU TEAM     Critical Care Time devoted to patient care services described in this note is 55 minutes.  Critical care was necessary to treat /prevent imminent and life-threatening deterioration. Overall, patient is critically ill, prognosis is guarded.    Lucie Leather, M.D.  [facility]  Medical Director [facility] Medical Director [facility] Cardio-Pulmonary Department

## 2023-02-05 NOTE — TOC CM/SW Note (Signed)
Transition of Care [facility]) - Inpatient Brief Assessment   Patient Details  Name: Sharon [name] MRN: [id number] Date of Birth: 1950/07/25  Transition of Care [facility]) CM/SW Contact:    Margarito Liner, LCSW Phone Number: 02/05/2023, 3:11 PM   Clinical Narrative: CSW reviewed chart. Patient is currently on the ventilator. Patient has a high readmission prevention risk. Will assess when appropriate. CSW will continue to follow progress.  Transition of Care Asessment: Insurance and Status: Insurance coverage has been reviewed Patient has primary care physician: Yes Home environment has been reviewed: Single family home Prior level of function:: Not documented Prior/Current Home Services: No current home services Social Determinants of Health Reivew: SDOH reviewed no interventions necessary Readmission risk has been reviewed: Yes Transition of care needs: transition of care needs identified, TOC will continue to follow

## 2023-02-05 NOTE — Progress Notes (Signed)
Jeri Modena NP notified of elevated BP 180/70 pulse 39. Per Jeri Modena will order PRN hydralazine. Will administer and continue to monitor.

## 2023-02-06 ENCOUNTER — Inpatient Hospital Stay: Payer: Medicare Other

## 2023-02-06 ENCOUNTER — Inpatient Hospital Stay: Payer: Medicare Other | Admitting: Anesthesiology

## 2023-02-06 ENCOUNTER — Other Ambulatory Visit: Payer: Self-pay

## 2023-02-06 ENCOUNTER — Encounter
Admission: EM | Disposition: A | Discharge status: Nursing Home (Includes ICF) | Payer: Self-pay | Source: Home / Self Care | Attending: Internal Medicine

## 2023-02-06 DIAGNOSIS — T783XXA Angioneurotic edema, initial encounter: Secondary | ICD-10-CM | POA: Diagnosis not present

## 2023-02-06 HISTORY — PX: TRACHEOSTOMY TUBE PLACEMENT: SHX814

## 2023-02-06 LAB — RENAL FUNCTION PANEL
Albumin: 2.7 g/dL — ABNORMAL LOW (ref 3.5–5.0)
Anion gap: 7 (ref 5–15)
BUN: 29 mg/dL — ABNORMAL HIGH (ref 8–23)
CO2: 24 mmol/L (ref 22–32)
Calcium: 8.1 mg/dL — ABNORMAL LOW (ref 8.9–10.3)
Chloride: 109 mmol/L (ref 98–111)
Creatinine, Ser: 0.71 mg/dL (ref 0.44–1.00)
GFR, Estimated: >60 mL/min (ref >60)
Glucose, Bld: 139 mg/dL — ABNORMAL HIGH (ref 70–99)
Phosphorus: 2.5 mg/dL (ref 2.5–4.6)
Potassium: 3.7 mmol/L (ref 3.5–5.1)
Sodium: 140 mmol/L (ref 135–145)

## 2023-02-06 LAB — CULTURE, BLOOD (ROUTINE X 2)
Culture: NO GROWTH
Culture: NO GROWTH
Special Requests: ADEQUATE
Special Requests: ADEQUATE

## 2023-02-06 LAB — GLUCOSE, CAPILLARY
Glucose-Capillary: 94 mg/dL (ref 70–99)
Glucose-Capillary: 78 mg/dL (ref 70–99)

## 2023-02-06 LAB — CBC
HCT: 29.1 % — ABNORMAL LOW (ref 36.0–46.0)
Hemoglobin: 9.1 g/dL — ABNORMAL LOW (ref 12.0–15.0)
MCH: 28.6 pg (ref 26.0–34.0)
MCHC: 31.3 g/dL (ref 30.0–36.0)
MCV: 91.5 fL (ref 80.0–100.0)
Platelets: 220 10*3/uL (ref 150–400)
RBC: 3.18 MIL/uL — ABNORMAL LOW (ref 3.87–5.11)
RDW: 14.8 % (ref 11.5–15.5)
WBC: 9 10*3/uL (ref 4.0–10.5)
nRBC: 0.3 % — ABNORMAL HIGH (ref 0.0–0.2)

## 2023-02-06 LAB — CULTURE, RESPIRATORY W GRAM STAIN

## 2023-02-06 SURGERY — CREATION, TRACHEOSTOMY
Anesthesia: General | Site: Throat

## 2023-02-06 MED ORDER — MIDAZOLAM HCL 2 MG/2ML IJ SOLN
1.0000 mg | INTRAMUSCULAR | Status: DC | PRN
  Administered 2023-02-06 – 2023-02-07 (×3): 2 mg via INTRAVENOUS
  Filled 2023-02-06 (×3): qty 2

## 2023-02-06 MED ORDER — ROCURONIUM BROMIDE 100 MG/10ML IV SOLN
INTRAVENOUS | Status: DC | PRN
Start: 2023-02-06 — End: 2023-02-06
  Administered 2023-02-06: 30 mg via INTRAVENOUS

## 2023-02-06 MED ORDER — SUGAMMADEX SODIUM 200 MG/2ML IV SOLN
INTRAVENOUS | Status: DC | PRN
Start: 2023-02-06 — End: 2023-02-06
  Administered 2023-02-06: 200 mg via INTRAVENOUS

## 2023-02-06 MED ORDER — LIDOCAINE-EPINEPHRINE 1 %-1:100000 IJ SOLN
INTRAMUSCULAR | Status: AC
  Filled 2023-02-06: qty 1

## 2023-02-06 MED ORDER — FENTANYL CITRATE (PF) 100 MCG/2ML IJ SOLN
INTRAMUSCULAR | Status: AC
  Filled 2023-02-06: qty 2

## 2023-02-06 MED ORDER — GLYCOPYRROLATE 0.2 MG/ML IJ SOLN
INTRAMUSCULAR | Status: DC | PRN
Start: 2023-02-06 — End: 2023-02-06
  Administered 2023-02-06: .2 mg via INTRAVENOUS

## 2023-02-06 MED ORDER — DEXAMETHASONE SODIUM PHOSPHATE 4 MG/ML IJ SOLN
4.0000 mg | Freq: Two times a day (BID) | INTRAMUSCULAR | Status: DC
  Administered 2023-02-06 – 2023-02-07 (×2): 4 mg via INTRAVENOUS
  Filled 2023-02-06 (×2): qty 1

## 2023-02-06 MED ORDER — 0.9 % SODIUM CHLORIDE (POUR BTL) OPTIME
TOPICAL | Status: DC | PRN
  Administered 2023-02-06: 500 mL

## 2023-02-06 MED ORDER — LIDOCAINE-EPINEPHRINE 1 %-1:100000 IJ SOLN
INTRAMUSCULAR | Status: DC | PRN
  Administered 2023-02-06: 8 mL

## 2023-02-06 MED ORDER — PROPOFOL 10 MG/ML IV BOLUS
INTRAVENOUS | Status: AC
  Filled 2023-02-06: qty 20

## 2023-02-06 SURGICAL SUPPLY — 35 items
ATTRACTOMAT 16X20 MAGNETIC DRP (DRAPES) ×2 IMPLANT
BLADE SURG 15 STRL LF DISP TIS (BLADE) ×2 IMPLANT
BLADE SURG 15 STRL SS (BLADE) ×1
BLADE SURG SZ11 CARB STEEL (BLADE) ×2 IMPLANT
CORD BIP STRL DISP 12FT (MISCELLANEOUS) ×2 IMPLANT
ELECT CAUTERY BLADE TIP 2.5 (TIP) ×1
ELECT REM PT RETURN 9FT ADLT (ELECTROSURGICAL) ×1
ELECTRODE CAUTERY BLDE TIP 2.5 (TIP) ×2 IMPLANT
ELECTRODE REM PT RTRN 9FT ADLT (ELECTROSURGICAL) ×2 IMPLANT
FORCEPS JEWEL BIP 4-3/4 STR (INSTRUMENTS) ×2 IMPLANT
GAUZE 4X4 16PLY ~~LOC~~+RFID DBL (SPONGE) ×2 IMPLANT
GLOVE BIO SURGEON STRL SZ7.5 (GLOVE) ×2 IMPLANT
GOWN STRL REUS W/ TWL LRG LVL3 (GOWN DISPOSABLE) ×4 IMPLANT
GOWN STRL REUS W/TWL LRG LVL3 (GOWN DISPOSABLE) ×2
HEMOSTAT SURGICEL 2X3 (HEMOSTASIS) IMPLANT
HLDR TRACH TUBE NECKBAND 18 (MISCELLANEOUS) ×2 IMPLANT
KIT TURNOVER KIT A (KITS) ×2 IMPLANT
LABEL OR SOLS (LABEL) ×2 IMPLANT
MANIFOLD NEPTUNE II (INSTRUMENTS) ×2 IMPLANT
NS IRRIG 500ML POUR BTL (IV SOLUTION) ×2 IMPLANT
PACK HEAD/NECK (MISCELLANEOUS) ×2 IMPLANT
SHEARS HARMONIC 9CM CVD (BLADE) ×2 IMPLANT
SPONGE DRAIN TRACH 4X4 STRL 2S (GAUZE/BANDAGES/DRESSINGS) ×2 IMPLANT
SPONGE KITTNER 5P (MISCELLANEOUS) ×2 IMPLANT
SUCTION TUBE FRAZIER 10FR DISP (SUCTIONS) ×2 IMPLANT
SUT ETHILON 2 0 FS 18 (SUTURE) ×2 IMPLANT
SUT SILK 2 0 (SUTURE)
SUT SILK 2-0 18XBRD TIE 12 (SUTURE) IMPLANT
SUT VICRYL+ 4-0 18IN PS-4 (SUTURE) ×2 IMPLANT
SYR 10ML LL (SYRINGE) ×2 IMPLANT
TRAP FLUID SMOKE EVACUATOR (MISCELLANEOUS) ×2 IMPLANT
TUBE TRACH 6.0 CUFF FLEX (MISCELLANEOUS) IMPLANT
TUBE TRACH FLEX 8.0 CUFF (MISCELLANEOUS)
TUBE TRACH FLEX 8.5 CUFF (MISCELLANEOUS) IMPLANT
WATER STERILE IRR 500ML POUR (IV SOLUTION) ×2 IMPLANT

## 2023-02-06 NOTE — Progress Notes (Addendum)
-  Late note- Per MD Kasa, instructions given to not turn or bathe patient due to pt being nasally intubated/unsteady airway. Pt schedule for tracheostomy on 02/06/23.

## 2023-02-06 NOTE — Op Note (Signed)
..  02/06/2023 12:12 PM  Clarisa Fling [id number]  Pre-Op Dx: Recurrent angioedema with airway compromise  Post-Op Dx:  same  Proc:  Tracheostomy  Surg:  Bud Face  Assistant:  McQueen  Anes:  GOT  EBL:  <19ml  Comp:  none  Findings:  Size 6 cuffed Shiley placed between tracheal rings 2 and 3  Procedure:  The patient was brought from the intensive care unit to the operating room and transferred to an operating table.  Anesthesia was administered per indwelling orotracheal tube.   Neck extension was achieved as possible anda shoulder rolke was placed.  The lower neck was palpated with the findings as described above.  1% Xylocaine with 1:100,000 epinephrine, 8 cc's, was infiltrated into the surgical field for intraoperative hemostasis.  Several minutes were allowed for this to take effect. The patient was prepped in a sterile fashion with a surgical prep from the chin down to the upper chest.  Sterile draping was accomplished in the standard fashion.  A  5 cm horizontal incision was made sharply a finger's breadth above the sternal notch, and extended through skin and subcutaneous fat.  Using cautery, the superficial layer of the deep cervical fascia was lysed.  Additional dissection revealed the strap muscles.  The midline raphe was divided in two layers and the muscles retracted laterally.  The pretracheal plane was visualized.  This was entered bluntly.  The thyroid isthmus was isolated and divided with the Harmonic scalpel.  The thyroid gland was retracted to either side.  The anterior face of the trachea was cleared.  In the  2nd-3rd interspace, a transverse incision was made between cartilage rings into the tracheal lumen.  A 6 mm wide inferiorly based flap was generated and secured to the lower wound with a 4-0 chromic suture.   A previously tested  # 6 Shiley cuffed tracheostomy tube was brought into the field.  With the endotracheal tube under direct visualization through the  tracheostomy, it was gently backed up.  The tracheostomy tube was inserted into the tracheal lumen.  Hemostasis was observed. The cuff was inflated and observed to be intact and containing pressure. The inner cannula was placed and ventilation assumed per tracheostomy tube.  Good chest wall motion was observed, and CO2 was documented per anesthesia.  The trach tube was secured in the standard fashion with trach ties. A 2-0 Nylon suture was used to secure the trach tube to the skin on both sides.  Hemostasis was observed again.  When satisfactory ventilation was assured, the orotracheal tube was removed.  At this point the procedure was completed.  The patient was returned to anesthesia, awakened as possible, and transferred back to the intensive care unit in stable condition.  Comment: 72 y.o. female with prolonged ventilation was the indication for today's procedure.  Anticipate a routine postoperative recovery including standard tracheal hygiene.  The sutures should be removed in 5 days.  When the patient no longer requires ventilator or pressure support, the cuff should be deflated.  Changing to an uncuffed tube and downsizing will be according to the clinical condition of the patient.   Terral Cooks  12:12 PM 02/06/2023

## 2023-02-06 NOTE — Progress Notes (Addendum)
NAME:  Sharon [name], MRN:  [id number], DOB:  October 02, 1950, LOS: 5 ADMISSION DATE:  02/01/2023  CHIEF COMPLAINT:  acute resp failure from angioedema   History of Present Illness:   72 year old female with a history of recurrent angioedema, husband says she has been to the hospital least 14 times in the last year for angioedema.  She was intubated once for this.  She is not on an ACE inhibitor and not on an ARB drug   ENT called to bedside Patient in obvious distress no stridor noted however she has significant angioedema of the floor mouth and tongue with the tongue protruding from the mouth.  She is unable to phonate without significant difficulty.  External ears appear normal with anterior nose benign oral cavity as above.  Patient was decongested with Afrin spray.  A flexible fiberoptic laryngoscope was introduced through the right nostril.  Examination of the posterior pharynx showed some uvular edema.  The tongue base that there is significant swelling was also significant edema of the epiglottis the bilateral arytenoids.  The vocal folds were visualized.  The subglottis and trachea appeared normal.  Patient was taken to OR for emergent nasal intubation  Case Discussed with ENT Plan for [facility] today   She has unsteady airway   Wound Check No wounds or pressure ulcers noted upon admission to ICU   Pertinent  Medical History  Previous h/o ANGIOEDEMA   Significant Hospital Events: Including procedures, antibiotic start and stop dates in addition to other pertinent events   10/10 admitted for severe resp failure for severe angioedema 10/11: remains intubated  10/12 remains intubated and sedated 10/13 remains nasally intubated and sedated 10/14 proceed with [facility] 10/15 TRACH planned for today with ENT surgery        Interim History / Subjective:  +hypertensive, bradycardic +Agitated Remains nasally intubated Remains critically ill    Antimicrobials:    Antibiotics Given (last 72 hours)     None      Objective   Blood pressure (!) 169/69, pulse (!) 40, temperature (!) 97.5 F (36.4 C), temperature source Axillary, resp. rate 12, height 5' 6.5" (1.689 m), weight 97.5 kg, SpO2 98%.    Vent Mode: PRVC FiO2 (%):  [35 %-95 %] 35 % Set Rate:  [16 bmp] 16 bmp Vt Set:  [450 mL] 450 mL PEEP:  [5 cmH20] 5 cmH20 Plateau Pressure:  [17 cmH20-22 cmH20] 19 cmH20   Intake/Output Summary (Last 24 hours) at 02/06/2023 0826 Last data filed at 02/06/2023 0700 Gross per 24 hour  Intake 1228.71 ml  Output 1075 ml  Net 153.71 ml   Filed Weights   02/01/23 1222  Weight: 97.5 kg     REVIEW OF SYSTEMS  PATIENT IS UNABLE TO PROVIDE COMPLETE REVIEW OF SYSTEMS DUE TO SEVERE CRITICAL ILLNESS   PHYSICAL EXAMINATION:  GENERAL:critically ill appearing, +resp distress EYES: Pupils equal, round, reactive to light.  No scleral icterus.  MOUTH: Nasally intubated  lips/tongue swelling improved NECK: Supple.  PULMONARY: Lungs clear to auscultation  CARDIOVASCULAR: S1 and S2.  Sinus rhythm bradycardia GASTROINTESTINAL: Soft, nontender, -distended. Positive bowel sounds.  MUSCULOSKELETAL: No swelling, clubbing, or edema.  NEUROLOGIC: obtunded,sedated SKIN:normal, warm to touch, Capillary refill delayed  Pulses present bilaterally    No wounds or pressure ulcers noted upon admission to ICU   ASSESSMENT AND PLAN SYNOPSIS  72 yo white female with multiple episodes of angioedema with acute laryngeal edema due to  angioedema leading to severe resp failure ANGIOEDEMA WORK  UP HAS BEEN NEGATIVE    Severe ACUTE Hypoxic and Hypercapnic Respiratory Failure -continue Mechanical Ventilator support -Wean Fio2 and PEEP as tolerated -VAP/VENT bundle implementation - Wean PEEP & FiO2 as tolerated, maintain SpO2 > 88% - Head of bed elevated 30 degrees, VAP protocol in place - Plateau pressures less than 30 cm H20  - Intermittent chest x-ray & ABG  PRN - Ensure adequate pulmonary hygiene  Patient needs TRACH to survive Family has consented TA cultures pending    SEVERE ANGIOEDEMA PLAN FOR TRACH TODAY CONSENT SIGNED STEROIDS AND H2 blockers   ACUTE KIDNEY INJURY/Renal Failure-improving -continue Foley Catheter-assess need -Avoid nephrotoxic agents -Follow urine output, BMP -Ensure adequate renal perfusion, optimize oxygenation -Renal dose medications   Intake/Output Summary (Last 24 hours) at 02/06/2023 0826 Last data filed at 02/06/2023 0700 Gross per 24 hour  Intake 1228.71 ml  Output 1075 ml  Net 153.71 ml   CARDIAC ICU monitoring  SINUS BRADYCARDIA -BP remains stable -Continue tele monitoring    NEUROLOGY She remains comfortable Goal RASS -2 to -3   ENDO - ICU hypoglycemic\Hyperglycemia protocol -check FSBS per protocol   GI GI PROPHYLAXIS as indicated  NUTRITIONAL STATUS DIET-->NPO Constipation protocol as indicated   ELECTROLYTES -follow labs as needed -replace as needed -pharmacy consultation and following  ACUTE ANEMIA TRANSFUSE AS NEEDED CONSIDER TRANSFUSION  IF HGB<7      Best practice (right click and "Reselect all SmartList Selections" daily)  Diet: Start tube feeds Pain/Anxiety/Delirium protocol (if indicated): Yes (RASS goal -3) VAP protocol (if indicated): Yes DVT prophylaxis: Lovenox GI prophylaxis: H2B Mobility:  bed rest  Code Status:  FULL Disposition:ICU  Labs   CBC: Recent Labs  Lab 02/01/23 1223 02/02/23 0306 02/03/23 0458 02/04/23 0331 02/05/23 0425 02/06/23 0431  WBC 9.1 5.0 12.3* 9.4 8.2 9.0  NEUTROABS 5.7  --   --   --   --   --   HGB 11.0* 9.6* 8.6* 8.8* 8.6* 9.1*  HCT 34.0* 29.3* 27.3* 27.7* 28.6* 29.1*  MCV 89.9 86.9 91.3 90.2 93.8 91.5  PLT 278 227 199 185 200 220    Basic Metabolic Panel: Recent Labs  Lab 02/01/23 1223 02/02/23 0306 02/03/23 0458 02/04/23 0331 02/05/23 0425 02/06/23 0431  NA 133* 139 140 140 143 140  K 3.1*  4.0 4.2 3.7 3.9 3.7  CL 97* 102 105 108 108 109  CO2 25 23 24 23 24 24   GLUCOSE 130* 158* 158* 153* 146* 139*  BUN 20 20 39* 35* 30* 29*  CREATININE 1.00 0.94 2.09* 1.13* 0.84 0.71  CALCIUM 9.4 9.3 8.9 8.3* 8.5* 8.1*  MG 1.7 2.0  --  2.3 2.6*  --   PHOS  --  3.7 5.5* 3.4 2.6 2.5   GFR: Estimated Creatinine Clearance: 75.6 mL/min (by C-G formula based on SCr of 0.71 mg/dL). Recent Labs  Lab 02/03/23 0458 02/04/23 0331 02/05/23 0425 02/06/23 0431  WBC 12.3* 9.4 8.2 9.0    Liver Function Tests: Recent Labs  Lab 02/01/23 1223 02/02/23 0306 02/03/23 0458 02/04/23 0331 02/05/23 0425 02/06/23 0431  AST 23  --   --   --   --   --   ALT 11  --   --   --   --   --   ALKPHOS 71  --   --   --   --   --   BILITOT 0.9  --   --   --   --   --  PROT 7.6  --   --   --   --   --   ALBUMIN 4.1 3.4* 3.4* 2.9* 2.9* 2.7*   No results for input(s): "LIPASE", "AMYLASE" in the last 168 hours. No results for input(s): "AMMONIA" in the last 168 hours.  ABG    Component Value Date/Time   PHART 7.38 02/01/2023 1357   PCO2ART 46 02/01/2023 1357   PO2ART 108 02/01/2023 1357   HCO3 27.2 02/01/2023 1357   ACIDBASEDEF 1.4 04/17/2021 1817   O2SAT 98.8 02/01/2023 1357     Coagulation Profile: Recent Labs  Lab 02/04/23 1045  INR 0.8    Cardiac Enzymes: No results for input(s): "CKTOTAL", "CKMB", "CKMBINDEX", "TROPONINI" in the last 168 hours.  HbA1C: Hgb A1c MFr Bld  Date/Time Value Ref Range Status  01/02/2023 10:22 AM 6.2 4.6 - 6.5 % Final    Comment:    Glycemic Control Guidelines for People with Diabetes:Non Diabetic:  <6%Goal of Therapy: <7%Additional Action Suggested:  >8%   12/12/2021 09:06 AM 5.7 4.6 - 6.5 % Final    Comment:    Glycemic Control Guidelines for People with Diabetes:Non Diabetic:  <6%Goal of Therapy: <7%Additional Action Suggested:  >8%    CBG: Recent Labs  Lab 02/01/23 1333 02/01/23 1955 02/01/23 2304 02/02/23 0418  GLUCAP 154* 152* 151* 150*   Past  Medical History:  She,  has a past medical history of Angio-edema, Anxiety, Cataract, GERD (gastroesophageal reflux disease), Hyperlipidemia, Hypertension, Hypothyroidism, and Vitamin D deficiency.   Surgical History:   Past Surgical History:  Procedure Laterality Date   CATARACT EXTRACTION W/ INTRAOCULAR LENS IMPLANT Right 12/29/2016   Dr. Mia Creek   CATARACT EXTRACTION W/ INTRAOCULAR LENS IMPLANT Left 01/15/2017   Dr. Marcial Pacas Bevis   COLONOSCOPY     INTUBATION-ENDOTRACHEAL WITH TRACHEOSTOMY STANDBY N/A 02/01/2023   Procedure: INTUBATION-ENDOTRACHEAL WITH TRACHEOSTOMY STANDBY;  Surgeon: Linus Salmons, MD;  Location: [facility] ORS;  Service: ENT;  Laterality: N/A;   LEEP     TUBAL LIGATION     VEIN LIGATION AND STRIPPING     x2      DVT/GI PRX  assessed I Assessed the need for Labs I Assessed the need for Foley I Assessed the need for Central Venous Line Family Discussion when available I Assessed the need for Mobilization I made an Assessment of medications to be adjusted accordingly Safety Risk assessment completed  CASE DISCUSSED IN MULTIDISCIPLINARY ROUNDS WITH ICU TEAM   Critical Care Time devoted to patient care services described in this note is 55 minutes.  Critical care was necessary to treat /prevent imminent and life-threatening deterioration.   Lucie Leather, M.D.  [facility]  Medical Director [facility] Medical Director [facility] Cardio-Pulmonary Department

## 2023-02-06 NOTE — Transfer of Care (Signed)
Immediate Anesthesia Transfer of Care Note  Patient: Sharon [name]  Procedure(s) Performed: TRACHEOSTOMY (Throat)  Patient Location: PACU and ICU  Anesthesia Type:General  Level of Consciousness: Patient remains intubated per anesthesia plan  Airway & Oxygen Therapy: Patient remains intubated per anesthesia plan and Patient placed on Ventilator (see vital sign flow sheet for setting)  Post-op Assessment: Report given to RN and Post -op Vital signs reviewed and stable  Post vital signs: Reviewed and stable  Last Vitals:  Vitals Value Taken Time  BP 124/66   Temp    Pulse 88   Resp 16   SpO2 98     Last Pain:  Vitals:   02/06/23 0800  TempSrc: Axillary         Complications: No notable events documented.

## 2023-02-06 NOTE — Progress Notes (Signed)
Pt off the floor to OR for tracheostomy, Report given to [facility] OR RN. Family at bedside.

## 2023-02-06 NOTE — Progress Notes (Signed)
Nutrition Follow Up Notes   DOCUMENTATION CODES:   Obesity unspecified  INTERVENTION:   RD will monitor for diet advancement vs the need for nutrition support   If NGT able to be placed, recommend:  Vital 1.2@20ml /hr + ProSource TF 20- Give 60ml QID via tube  Free water flushes 30ml q4 hours to maintain tube patency   Propofol: 43.8 ml/hr- provides 1156kcal/day   Regimen provides 2052kcal/day, 116g/day protein and 572ml/day of free water   Pt at high refeed risk; recommend monitor potassium, magnesium and phosphorus labs daily until stable  Daily weights   NUTRITION DIAGNOSIS:   Inadequate oral intake related to inability to eat (pt sedated and ventilated) as evidenced by NPO status. -ongoing   GOAL:   Provide needs based on ASPEN/SCCM guidelines -not met   MONITOR:   Vent status, Labs, Weight trends, TF tolerance, I & O's, Skin  ASSESSMENT:   72 y/o female with h/o hypothyroidism, HLD, anxiety, HTN, GERD, SVT, CHF, alpha-gal and recurrent angioedema who is admitted with severe angioedema requiring transnasal endotracheal intubation 10/10.  Pt remains sedated and ventilated. Pt remains without enteral access and is now without adequate nutrition for 5 days. Plan is for tracheostomy today. Will plan to attempt NGT placement after tracheostomy if patient is unable to be placed on an oral diet soon. Pt is at high refeed risk. No new weight since admission.   Medications reviewed and include: dexamethasone, colace, miralax, pepcid, propofol  Labs reviewed: K 3.7 wnl, BUN 39(H), P 2.5 wnl Mg 2.6(H)- 10/14 B12- 191(L), Vitamin D- 24.95(L)- 9/10 Hgb 9.1(L), Hct 29.1(L)  Patient is currently intubated on ventilator support MV: 7.2 L/min Temp (24hrs), Avg:98.1 F (36.7 C), Min:97.5 F (36.4 C), Max:98.9 F (37.2 C)  Propofol: 43.8 ml/hr- provides 1156kcal/day   MAP- >20mmHg   UOP-   Diet Order:    Diet Order             Diet NPO time specified  Diet  effective now                  EDUCATION NEEDS:   No education needs have been identified at this time  Skin:  Skin Assessment: Reviewed RN Assessment  Last BM:  pta  Height:   Ht Readings from Last 1 Encounters:  02/02/23 5' 6.5" (1.689 m)    Weight:   Wt Readings from Last 1 Encounters:  02/01/23 97.5 kg    Ideal Body Weight:  60 kg  BMI:  Body mass index is 34.18 kg/m.  Estimated Nutritional Needs:   Kcal:  1684kcal/day  Protein:  >120g/day  Fluid:  1.6-1.8L/day  Betsey Holiday MS, RD, LDN Please refer to [facility] for RD and/or RD on-call/weekend/after hours pager

## 2023-02-06 NOTE — Plan of Care (Signed)
  Problem: Education: Goal: Knowledge of General Education information will improve Description: Including pain rating scale, medication(s)/side effects and non-pharmacologic comfort measures Outcome: Progressing   Problem: Coping: Goal: Level of anxiety will decrease Outcome: Progressing   Problem: Pain Managment: Goal: General experience of comfort will improve Outcome: Progressing   Problem: Safety: Goal: Ability to remain free from injury will improve Outcome: Progressing   Problem: Skin Integrity: Goal: Risk for impaired skin integrity will decrease Outcome: Progressing   Problem: Nutrition: Goal: Adequate nutrition will be maintained Outcome: Not Progressing

## 2023-02-06 NOTE — Progress Notes (Signed)
PHARMACY CONSULT NOTE  Pharmacy Consult for Electrolyte Monitoring and Replacement   Recent Labs: Potassium (mmol/L)  Date Value  02/06/2023 3.7   Magnesium (mg/dL)  Date Value  25/36/6440 2.6 (H)   Calcium (mg/dL)  Date Value  34/74/2595 8.1 (L)   Albumin (g/dL)  Date Value  63/87/5643 2.7 (L)   Phosphorus (mg/dL)  Date Value  32/95/1884 2.5   Sodium (mmol/L)  Date Value  02/06/2023 140     Assessment: 72 year old female with history of recurrent angioedema admitted to CCU after recurrent angioedema episode. Patient is ventilated and sedated on fentanyl infusion and propofol.  Goal of Therapy:  Electrolytes within normal limits  Plan:  No replacement warranted Follow up electrolytes tomorrow AM  Lowella Bandy, PharmD Clinical Pharmacist 02/06/2023 7:07 AM

## 2023-02-06 NOTE — Anesthesia Preprocedure Evaluation (Signed)
Anesthesia Evaluation  Patient identified by MRN, date of birth, ID band Patient unresponsive  General Assessment Comment:  Patient nasally intubated, sedated, due to continued angioedema.  Reviewed: Allergy & Precautions, NPO status , Patient's Chart, lab work & pertinent test results, Unable to perform ROS - Chart review only  History of Anesthesia Complications Negative for: history of anesthetic complications  Airway Mallampati: Intubated  TM Distance: <3 FB     Dental  (+) Teeth Intact   Pulmonary neg sleep apnea, neg COPD, Patient abstained from smoking.Not current smoker, former smoker Hx frequent angioedema   breath sounds clear to auscultation       Cardiovascular Exercise Tolerance: Good METShypertension, Pt. on medications (-) CAD and (-) Past MI (-) dysrhythmias  Rhythm:Regular Rate:Normal     Neuro/Psych  PSYCHIATRIC DISORDERS Anxiety     negative neurological ROS     GI/Hepatic ,GERD  ,,(+)     (-) substance abuse    Endo/Other  neg diabetesHypothyroidism    Renal/GU negative Renal ROS     Musculoskeletal   Abdominal  (+) + obese  Peds  Hematology   Anesthesia Other Findings Past Medical History: No date: Angio-edema No date: Anxiety No date: Cataract No date: GERD (gastroesophageal reflux disease) No date: Hyperlipidemia No date: Hypertension No date: Hypothyroidism No date: Vitamin D deficiency  Reproductive/Obstetrics                             Anesthesia Physical Anesthesia Plan  ASA: 3  Anesthesia Plan: General   Post-op Pain Management: Minimal or no pain anticipated   Induction: Inhalational  PONV Risk Score and Plan: 3 and Ondansetron and Dexamethasone  Airway Management Planned: Tracheostomy and Nasal ETT  Additional Equipment: None  Intra-op Plan:   Post-operative Plan: Post-operative intubation/ventilation  Informed Consent: I have  reviewed the patients History and Physical, chart, labs and discussed the procedure including the risks, benefits and alternatives for the proposed anesthesia with the patient or authorized representative who has indicated his/her understanding and acceptance.     Consent reviewed with POA  Plan Discussed with: CRNA and Surgeon  Anesthesia Plan Comments: (Discussed risks of anesthesia with patient's husband at bedside, including PONV, sore throat, lip/dental/eye damage. Rare risks discussed as well, such as cardiorespiratory and neurological sequelae, and allergic reactions, and in rare situations death.. Discussed the role of CRNA in patient's perioperative care. Husband understands.)        Anesthesia Quick Evaluation

## 2023-02-07 ENCOUNTER — Encounter: Payer: Self-pay | Admitting: Otolaryngology

## 2023-02-07 DIAGNOSIS — T783XXA Angioneurotic edema, initial encounter: Secondary | ICD-10-CM | POA: Diagnosis not present

## 2023-02-07 LAB — CBC
HCT: 32.1 % — ABNORMAL LOW (ref 36.0–46.0)
Hemoglobin: 9.8 g/dL — ABNORMAL LOW (ref 12.0–15.0)
MCH: 28 pg (ref 26.0–34.0)
MCHC: 30.5 g/dL (ref 30.0–36.0)
MCV: 91.7 fL (ref 80.0–100.0)
Platelets: 292 10*3/uL (ref 150–400)
RBC: 3.5 MIL/uL — ABNORMAL LOW (ref 3.87–5.11)
RDW: 14.9 % (ref 11.5–15.5)
WBC: 10.6 10*3/uL — ABNORMAL HIGH (ref 4.0–10.5)
nRBC: 0.2 % (ref 0.0–0.2)

## 2023-02-07 LAB — BASIC METABOLIC PANEL
Anion gap: 7 (ref 5–15)
BUN: 31 mg/dL — ABNORMAL HIGH (ref 8–23)
CO2: 23 mmol/L (ref 22–32)
Calcium: 8 mg/dL — ABNORMAL LOW (ref 8.9–10.3)
Chloride: 109 mmol/L (ref 98–111)
Creatinine, Ser: 0.64 mg/dL (ref 0.44–1.00)
GFR, Estimated: >60 mL/min (ref >60)
Glucose, Bld: 116 mg/dL — ABNORMAL HIGH (ref 70–99)
Potassium: 3.5 mmol/L (ref 3.5–5.1)
Sodium: 139 mmol/L (ref 135–145)

## 2023-02-07 LAB — GLUCOSE, CAPILLARY
Glucose-Capillary: 118 mg/dL — ABNORMAL HIGH (ref 70–99)
Glucose-Capillary: 88 mg/dL (ref 70–99)
Glucose-Capillary: 129 mg/dL — ABNORMAL HIGH (ref 70–99)
Glucose-Capillary: 126 mg/dL — ABNORMAL HIGH (ref 70–99)
Glucose-Capillary: 136 mg/dL — ABNORMAL HIGH (ref 70–99)
Glucose-Capillary: 143 mg/dL — ABNORMAL HIGH (ref 70–99)

## 2023-02-07 LAB — BLOOD GAS, ARTERIAL
Acid-Base Excess: 0.3 mmol/L (ref 0.0–2.0)
Bicarbonate: 22.6 mmol/L (ref 20.0–28.0)
FIO2: 30 %
O2 Saturation: 97.3 %
PEEP: 5 cmH2O
Patient temperature: 37
Pressure support: 5 cmH2O
pCO2 arterial: 29 mm[Hg] — ABNORMAL LOW (ref 32–48)
pH, Arterial: 7.5 — ABNORMAL HIGH (ref 7.35–7.45)
pO2, Arterial: 76 mm[Hg] — ABNORMAL LOW (ref 83–108)

## 2023-02-07 LAB — PHOSPHORUS: Phosphorus: 3.1 mg/dL (ref 2.5–4.6)

## 2023-02-07 LAB — MAGNESIUM: Magnesium: 2.6 mg/dL — ABNORMAL HIGH (ref 1.7–2.4)

## 2023-02-07 MED ORDER — MORPHINE SULFATE (PF) 2 MG/ML IV SOLN
1.0000 mg | INTRAVENOUS | Status: DC | PRN
  Administered 2023-02-07 – 2023-02-08 (×4): 2 mg via INTRAVENOUS
  Filled 2023-02-07 (×4): qty 1

## 2023-02-07 MED ORDER — SERTRALINE HCL 50 MG PO TABS: 100.0000 mg | ORAL_TABLET | Freq: Every day | ORAL | Status: DC

## 2023-02-07 MED ORDER — DIAZEPAM 5 MG/ML IJ SOLN
2.5000 mg | Freq: Once | INTRAMUSCULAR | Status: AC
  Administered 2023-02-07: 2.5 mg via INTRAVENOUS
  Filled 2023-02-07: qty 2

## 2023-02-07 MED ORDER — OXYCODONE HCL 5 MG PO TABS
5.0000 mg | ORAL_TABLET | Freq: Four times a day (QID) | ORAL | Status: DC
  Administered 2023-02-07 – 2023-02-09 (×9): 5 mg
  Filled 2023-02-07 (×9): qty 1

## 2023-02-07 MED ORDER — LEVOTHYROXINE SODIUM 50 MCG PO TABS: 50.0000 ug | ORAL_TABLET | Freq: Every day | ORAL | Status: DC

## 2023-02-07 MED ORDER — PIVOT 1.5 CAL PO LIQD
1000.0000 mL | ORAL | Status: DC
  Administered 2023-02-07: 1000 mL
  Filled 2023-02-07: qty 1000

## 2023-02-07 MED ORDER — FONDAPARINUX SODIUM 2.5 MG/0.5ML ~~LOC~~ SOLN
2.5000 mg | SUBCUTANEOUS | Status: DC
  Administered 2023-02-07 – 2023-02-09 (×3): 2.5 mg via SUBCUTANEOUS
  Filled 2023-02-07 (×3): qty 0.5

## 2023-02-07 MED ORDER — DEXMEDETOMIDINE HCL IN NACL 400 MCG/100ML IV SOLN
0.0000 ug/kg/h | INTRAVENOUS | Status: DC
  Administered 2023-02-07 (×2): 0.4 ug/kg/h via INTRAVENOUS
  Administered 2023-02-08: 0.6 ug/kg/h via INTRAVENOUS
  Administered 2023-02-08: 0.7 ug/kg/h via INTRAVENOUS
  Administered 2023-02-08 (×2): 0.8 ug/kg/h via INTRAVENOUS
  Administered 2023-02-09 (×2): 0.6 ug/kg/h via INTRAVENOUS
  Filled 2023-02-07 (×10): qty 100

## 2023-02-07 MED ORDER — FREE WATER
30.0000 mL | Status: DC
  Administered 2023-02-07 – 2023-02-09 (×14): 30 mL

## 2023-02-07 MED ORDER — BISACODYL 10 MG RE SUPP: 10.0000 mg | Freq: Every day | RECTAL | Status: DC | PRN

## 2023-02-07 NOTE — Progress Notes (Signed)
Nutrition Follow Up Notes   DOCUMENTATION CODES:   Obesity unspecified  INTERVENTION:   Pivot 1.5@45ml /hr- Initiate at 39ml/hr and increase by 59ml/hr q 8 hours until goal rate is reached.   Free water flushes 30ml q4 hours to maintain tube patency   Regimen provides 1620kcal/day, 101g/day protein and 1086ml/day of free water   Pt at high refeed risk; recommend monitor potassium, magnesium and phosphorus labs daily until stable  Daily weights   NUTRITION DIAGNOSIS:   Inadequate oral intake related to inability to eat (pt sedated and ventilated) as evidenced by NPO status. -ongoing   GOAL:   Provide needs based on ASPEN/SCCM guidelines -not met   MONITOR:   Vent status, Labs, Weight trends, TF tolerance, I & O's, Skin  ASSESSMENT:   72 y/o female with h/o hypothyroidism, HLD, anxiety, HTN, GERD, SVT, CHF, alpha-gal and recurrent angioedema who is admitted with severe angioedema requiring transnasal endotracheal intubation 10/10.  -Pt s/p tracheostomy and NGT placement 10/15  Pt remains sedated and ventilated via trach. NGT placed yesterday; will initiate tube feeds today. Pt with noted alpha-gal allergy; pt unable to receive any protein modulars. Pt also noted to have anaphylaxis reaction to zinc. Zinc is found in many different foods, is stored in many places throughout our bodies and is an essential mineral; suspect low likelihood of a true zinc allergy. All tube feed formulas contain zinc; will have to initiate tube feeds and monitor. Pt is at high refeed risk. No new weight since admit.     Medications reviewed and include: colace, miralax, pepcid, propofol  Labs reviewed: K 3.5 wnl, BUN 31(H), P 3.1 wnl, Mg 2.6(H) B12- 191(L), Vitamin D- 24.95(L)- 9/10 Hgb 9.8(L), Hct 32.1(L) Cbgs- 88, 118 x 24 hrs   Patient is currently intubated on ventilator support MV: 9.7 L/min Temp (24hrs), Avg:98.4 F (36.9 C), Min:97.1 F (36.2 C), Max:99.2 F (37.3 C)  Propofol: 23.4  ml/hr- provides 618kcal/day  MAP- >70mmHg   UOP-   Diet Order:    Diet Order             Diet NPO time specified  Diet effective now                  EDUCATION NEEDS:   No education needs have been identified at this time  Skin:  Skin Assessment: Reviewed RN Assessment  Last BM:  pta  Height:   Ht Readings from Last 1 Encounters:  02/02/23 5' 6.5" (1.689 m)    Weight:   Wt Readings from Last 1 Encounters:  02/01/23 97.5 kg    Ideal Body Weight:  60 kg  BMI:  Body mass index is 34.18 kg/m.  Estimated Nutritional Needs:   Kcal:  1684kcal/day  Protein:  >120g/day  Fluid:  1.6-1.8L/day  Betsey Holiday MS, RD, LDN Please refer to [facility] for RD and/or RD on-call/weekend/after hours pager

## 2023-02-07 NOTE — Anesthesia Postprocedure Evaluation (Signed)
Anesthesia Post Note  Patient: Sharon [name]  Procedure(s) Performed: TRACHEOSTOMY (Throat)  Patient location during evaluation: SICU Anesthesia Type: General Level of consciousness: sedated Pain management: pain level controlled Vital Signs Assessment: post-procedure vital signs reviewed and stable Respiratory status: patient remains intubated per anesthesia plan Cardiovascular status: stable Postop Assessment: no apparent nausea or vomiting Anesthetic complications: no  No notable events documented.   Last Vitals:  Vitals:   02/07/23 0500 02/07/23 0600  BP: 112/63 97/67  Pulse: (!) 56 (!) 58  Resp: 10 (!) 9  Temp:    SpO2: 97% 97%    Last Pain:  Vitals:   02/07/23 0400  TempSrc: Oral  PainSc:                  Elmarie Mainland

## 2023-02-07 NOTE — Addendum Note (Signed)
Addendum  created 02/07/23 0801 by Elmarie Mainland, CRNA   Clinical Note Signed, Delete clinical note

## 2023-02-07 NOTE — Progress Notes (Signed)
NAME:  Sharon [name], MRN:  [id number], DOB:  1950-06-18, LOS: 6 ADMISSION DATE:  02/01/2023  CHIEF COMPLAINT:   Acute resp failure from angioedema   History of Present Illness:   72 year old female with a history of recurrent angioedema, husband says she has been to the hospital least 14 times in the last year for angioedema.  She was intubated one other time before this.  She is not on an ACE inhibitor and not on an ARB drug.  ENT called to bedside Patient in obvious distress no stridor noted however she has significant angioedema of the floor mouth and tongue with the tongue protruding from the mouth.  She is unable to phonate without significant difficulty.  External ears appear normal with anterior nose benign oral cavity as above.  Patient was decongested with Afrin spray.  A flexible fiberoptic laryngoscope was introduced through the right nostril.  Examination of the posterior pharynx showed some uvular edema.  The tongue base that there is significant swelling was also significant edema of the epiglottis the bilateral arytenoids.  The vocal folds were visualized.  The subglottis and trachea appeared normal.  Patient was taken to OR for emergent nasal intubation  Case Discussed with ENT [facility] was placed yesterday 02/06/23 SAT/SBT  planned for today with family at bedside    Wound Check No wounds or pressure ulcers noted upon admission to ICU   Pertinent  Medical History  Previous h/o ANGIOEDEMA   Significant Hospital Events: Including procedures, antibiotic start and stop dates in addition to other pertinent events   10/10 admitted for severe resp failure for severe angioedema 10/11: remains intubated  10/12 remains intubated and sedated 10/13 remains nasally intubated and sedated 10/14 proceed with [facility] 10/15 TRACH planned for today with ENT surgery 10/16 SAT/SBT  planned for today        Interim History / Subjective:  +Hypertensive +Sedated  Remains  critically ill     Antimicrobials:   Antibiotics Given (last 72 hours)     None      Objective   Blood pressure 97/67, pulse (!) 58, temperature 99 F (37.2 C), temperature source Oral, resp. rate (!) 9, height 5' 6.5" (1.689 m), weight 97.5 kg, SpO2 97%.    Vent Mode: PCV FiO2 (%):  [35 %] 35 % Set Rate:  [16 bmp] 16 bmp Vt Set:  [450 mL] 450 mL PEEP:  [5 cmH20] 5 cmH20 Plateau Pressure:  [20 cmH20-21 cmH20] 20 cmH20   Intake/Output Summary (Last 24 hours) at 02/07/2023 0724 Last data filed at 02/07/2023 0600 Gross per 24 hour  Intake 1079.64 ml  Output 895 ml  Net 184.64 ml   Filed Weights   02/01/23 1222  Weight: 97.5 kg     REVIEW OF SYSTEMS  PATIENT IS UNABLE TO PROVIDE COMPLETE REVIEW OF SYSTEMS DUE TO SEVERE CRITICAL ILLNESS   PHYSICAL EXAMINATION:  GENERAL:critically ill appearing, +resp distress EYES: Pupils equal, round, reactive to light.  No scleral icterus.  MOUTH: Dobhoff in place lips/tongue swelling improved, mildly edematous NECK: Supple, mild swelling. Size 6 cuffed Shiley placed between tracheal rings 2 and 3. PULMONARY: Lungs clear to auscultation +rhonchi  CARDIOVASCULAR: S1 and S2.  Sinus rhythm bradycardia GASTROINTESTINAL: Soft, nontender, -distended. Positive bowel sounds.  MUSCULOSKELETAL: No swelling, clubbing, or edema.  NEUROLOGIC: obtunded, sedated SKIN:normal, warm to touch, Capillary refill delayed  + Pulses present bilaterally GU: external catheter in place EXTREMITIES: PICC line in place LUE, PIV in RUE both dressings are clean  dry and intact    No wounds or pressure ulcers noted upon admission to ICU   ASSESSMENT AND PLAN SYNOPSIS  72 yo white female with multiple episodes of angioedema with acute laryngeal edema due to  angioedema leading to severe resp failure ANGIOEDEMA WORK UP HAS BEEN NEGATIVE    Severe ACUTE Hypoxic and Hypercapnic Respiratory Failure -continue Mechanical Ventilator support -Wean Fio2 and  PEEP as tolerated -VAP/VENT bundle implementation - Wean PEEP & FiO2 as tolerated, maintain SpO2 > 88% - Head of bed elevated 30 degrees, VAP protocol in place - Plateau pressures less than 30 cm H20  - Intermittent chest x-ray & ABG PRN - Ensure adequate pulmonary hygiene  Patient needs TRACH to survive Family has consented TA- MODERATE Normal respiratory flora-no Staph aureus or Pseudomonas seen      SEVERE ANGIOEDEMA TRACH placed 10/15 STEROIDS AND H2 blockers   ACUTE KIDNEY INJURY/Renal Failure-improving -Foley Catheter discontinued, external catheter in place -Avoid nephrotoxic agents -Follow urine output, BMP -Ensure adequate renal perfusion, optimize oxygenation -Renal dose medications   Intake/Output Summary (Last 24 hours) at 02/07/2023 0724 Last data filed at 02/07/2023 0600 Gross per 24 hour  Intake 1079.64 ml  Output 895 ml  Net 184.64 ml        Latest Ref Rng & Units 02/07/2023    4:44 AM 02/06/2023    4:31 AM 02/05/2023    4:25 AM  BMP  Glucose 70 - 99 mg/dL 161  096  045   BUN 8 - 23 mg/dL 31  29  30    Creatinine 0.44 - 1.00 mg/dL 4.09  8.11  9.14   Sodium 135 - 145 mmol/L 139  140  143   Potassium 3.5 - 5.1 mmol/L 3.5  3.7  3.9   Chloride 98 - 111 mmol/L 109  109  108   CO2 22 - 32 mmol/L 23  24  24    Calcium 8.9 - 10.3 mg/dL 8.0  8.1  8.5     CARDIAC ICU monitoring  SINUS BRADYCARDIA -BP remains stable -Continue tele monitoring    NEUROLOGY She remains comfortable Goal RASS -2 to -3 WUA pending   ENDO - ICU hypoglycemic\Hyperglycemia protocol -check FSBS per protocol   GI GI PROPHYLAXIS as indicated  NUTRITIONAL STATUS DIET-->tube feeds, starting today 10/16 Dophoff in place, right nare Constipation protocol as indicated   ELECTROLYTES -follow labs as needed -replace as needed -pharmacy consultation and following  ACUTE ANEMIA TRANSFUSE AS NEEDED CONSIDER TRANSFUSION  IF HGB<7      Best practice (right click  and "Reselect all SmartList Selections" daily)  Diet: Start tube feeds 02/07/23 Pain/Anxiety/Delirium protocol (if indicated): Yes (RASS goal -3) VAP protocol (if indicated): Yes DVT prophylaxis: Lovenox GI prophylaxis: H2B Mobility:  bed rest  Code Status:  FULL Disposition:ICU  Labs   CBC: Recent Labs  Lab 02/01/23 1223 02/02/23 0306 02/03/23 0458 02/04/23 0331 02/05/23 0425 02/06/23 0431 02/07/23 0444  WBC 9.1   < > 12.3* 9.4 8.2 9.0 10.6*  NEUTROABS 5.7  --   --   --   --   --   --   HGB 11.0*   < > 8.6* 8.8* 8.6* 9.1* 9.8*  HCT 34.0*   < > 27.3* 27.7* 28.6* 29.1* 32.1*  MCV 89.9   < > 91.3 90.2 93.8 91.5 91.7  PLT 278   < > 199 185 200 220 292   < > = values in this interval not displayed.    Basic Metabolic  Panel: Recent Labs  Lab 02/01/23 1223 02/01/23 1223 02/02/23 0306 02/03/23 0458 02/04/23 0331 02/05/23 0425 02/06/23 0431 02/07/23 0444  NA 133*  --  139 140 140 143 140 139  K 3.1*  --  4.0 4.2 3.7 3.9 3.7 3.5  CL 97*  --  102 105 108 108 109 109  CO2 25  --  23 24 23 24 24 23   GLUCOSE 130*  --  158* 158* 153* 146* 139* 116*  BUN 20  --  20 39* 35* 30* 29* 31*  CREATININE 1.00  --  0.94 2.09* 1.13* 0.84 0.71 0.64  CALCIUM 9.4  --  9.3 8.9 8.3* 8.5* 8.1* 8.0*  MG 1.7  --  2.0  --  2.3 2.6*  --  2.6*  PHOS  --    < > 3.7 5.5* 3.4 2.6 2.5 3.1   < > = values in this interval not displayed.   GFR: Estimated Creatinine Clearance: 75.6 mL/min (by C-G formula based on SCr of 0.64 mg/dL). Recent Labs  Lab 02/04/23 0331 02/05/23 0425 02/06/23 0431 02/07/23 0444  WBC 9.4 8.2 9.0 10.6*    Liver Function Tests: Recent Labs  Lab 02/01/23 1223 02/02/23 0306 02/03/23 0458 02/04/23 0331 02/05/23 0425 02/06/23 0431  AST 23  --   --   --   --   --   ALT 11  --   --   --   --   --   ALKPHOS 71  --   --   --   --   --   BILITOT 0.9  --   --   --   --   --   PROT 7.6  --   --   --   --   --   ALBUMIN 4.1 3.4* 3.4* 2.9* 2.9* 2.7*   No results for  input(s): "LIPASE", "AMYLASE" in the last 168 hours. No results for input(s): "AMMONIA" in the last 168 hours.  ABG    Component Value Date/Time   PHART 7.38 02/01/2023 1357   PCO2ART 46 02/01/2023 1357   PO2ART 108 02/01/2023 1357   HCO3 27.2 02/01/2023 1357   ACIDBASEDEF 1.4 04/17/2021 1817   O2SAT 98.8 02/01/2023 1357     Coagulation Profile: Recent Labs  Lab 02/04/23 1045  INR 0.8    Cardiac Enzymes: No results for input(s): "CKTOTAL", "CKMB", "CKMBINDEX", "TROPONINI" in the last 168 hours.  HbA1C: Hgb A1c MFr Bld  Date/Time Value Ref Range Status  01/02/2023 10:22 AM 6.2 4.6 - 6.5 % Final    Comment:    Glycemic Control Guidelines for People with Diabetes:Non Diabetic:  <6%Goal of Therapy: <7%Additional Action Suggested:  >8%   12/12/2021 09:06 AM 5.7 4.6 - 6.5 % Final    Comment:    Glycemic Control Guidelines for People with Diabetes:Non Diabetic:  <6%Goal of Therapy: <7%Additional Action Suggested:  >8%    CBG: Recent Labs  Lab 02/01/23 2304 02/02/23 0418 02/06/23 1952 02/06/23 2308 02/07/23 0316  GLUCAP 151* 150* 94 78 118*   Past Medical History:  She,  has a past medical history of Angio-edema, Anxiety, Cataract, GERD (gastroesophageal reflux disease), Hyperlipidemia, Hypertension, Hypothyroidism, and Vitamin D deficiency.   Surgical History:   Past Surgical History:  Procedure Laterality Date   CATARACT EXTRACTION W/ INTRAOCULAR LENS IMPLANT Right 12/29/2016   Dr. Mia Creek   CATARACT EXTRACTION W/ INTRAOCULAR LENS IMPLANT Left 01/15/2017   Dr. Mia Creek   COLONOSCOPY     INTUBATION-ENDOTRACHEAL  WITH TRACHEOSTOMY STANDBY N/A 02/01/2023   Procedure: INTUBATION-ENDOTRACHEAL WITH TRACHEOSTOMY STANDBY;  Surgeon: Linus Salmons, MD;  Location: [facility] ORS;  Service: ENT;  Laterality: N/A;   LEEP     TUBAL LIGATION     VEIN LIGATION AND STRIPPING     x2       DVT/GI PRX  assessed I Assessed the need for Labs I Assessed the need for  Foley I Assessed the need for Central Venous Line Family Discussion when available I Assessed the need for Mobilization I made an Assessment of medications to be adjusted accordingly Safety Risk assessment completed  CASE DISCUSSED IN MULTIDISCIPLINARY ROUNDS WITH ICU TEAM   Critical Care Time devoted to patient care services described in this note is 55 minutes.  Critical care was necessary to treat /prevent imminent and life-threatening deterioration. Overall, patient is critically ill, prognosis is guarded.    Lucie Leather, M.D.  [facility]  Medical Director [facility] Medical Director [facility] Cardio-Pulmonary Department

## 2023-02-07 NOTE — Progress Notes (Signed)
..02/07/2023 1:31 PM  Clarisa Fling [id number]  Post-Op Day 1    Temp:  [97.1 F (36.2 C)-99.2 F (37.3 C)] 98.8 F (37.1 C) (10/16 0800) Pulse Rate:  [42-112] 112 (10/16 1200) Resp:  [9-28] 28 (10/16 1200) BP: (97-191)/(57-101) 177/81 (10/16 1200) SpO2:  [88 %-98 %] 91 % (10/16 1200) FiO2 (%):  [28 %-35 %] 28 % (10/16 1115),     Intake/Output Summary (Last 24 hours) at 02/07/2023 1331 Last data filed at 02/07/2023 1021 Gross per 24 hour  Intake 974.61 ml  Output 795 ml  Net 179.61 ml    Results for orders placed or performed during the hospital encounter of 02/01/23 (from the past 24 hour(s))  Glucose, capillary     Status: None   Collection Time: 02/06/23  7:52 PM  Result Value Ref Range   Glucose-Capillary 94 70 - 99 mg/dL  Glucose, capillary     Status: None   Collection Time: 02/06/23 11:08 PM  Result Value Ref Range   Glucose-Capillary 78 70 - 99 mg/dL  Glucose, capillary     Status: Abnormal   Collection Time: 02/07/23  3:16 AM  Result Value Ref Range   Glucose-Capillary 118 (H) 70 - 99 mg/dL  Basic metabolic panel     Status: Abnormal   Collection Time: 02/07/23  4:44 AM  Result Value Ref Range   Sodium 139 135 - 145 mmol/L   Potassium 3.5 3.5 - 5.1 mmol/L   Chloride 109 98 - 111 mmol/L   CO2 23 22 - 32 mmol/L   Glucose, Bld 116 (H) 70 - 99 mg/dL   BUN 31 (H) 8 - 23 mg/dL   Creatinine, Ser 4.09 0.44 - 1.00 mg/dL   Calcium 8.0 (L) 8.9 - 10.3 mg/dL   GFR, Estimated >81 >19 mL/min   Anion gap 7 5 - 15  CBC     Status: Abnormal   Collection Time: 02/07/23  4:44 AM  Result Value Ref Range   WBC 10.6 (H) 4.0 - 10.5 K/uL   RBC 3.50 (L) 3.87 - 5.11 MIL/uL   Hemoglobin 9.8 (L) 12.0 - 15.0 g/dL   HCT 14.7 (L) 82.9 - 56.2 %   MCV 91.7 80.0 - 100.0 fL   MCH 28.0 26.0 - 34.0 pg   MCHC 30.5 30.0 - 36.0 g/dL   RDW 13.0 86.5 - 78.4 %   Platelets 292 150 - 400 K/uL   nRBC 0.2 0.0 - 0.2 %  Magnesium     Status: Abnormal   Collection Time: 02/07/23  4:44 AM   Result Value Ref Range   Magnesium 2.6 (H) 1.7 - 2.4 mg/dL  Phosphorus     Status: None   Collection Time: 02/07/23  4:44 AM  Result Value Ref Range   Phosphorus 3.1 2.5 - 4.6 mg/dL  Glucose, capillary     Status: None   Collection Time: 02/07/23  8:33 AM  Result Value Ref Range   Glucose-Capillary 88 70 - 99 mg/dL  Glucose, capillary     Status: Abnormal   Collection Time: 02/07/23  1:12 PM  Result Value Ref Range   Glucose-Capillary 129 (H) 70 - 99 mg/dL    SUBJECTIVE:  awakening from sedation today  OBJECTIVE:  GEN-  Intermittent movements as awakening from sedation.  Limited following of commands NECK-  trach secure and patent, dressing previously changed by RT/Nursing staff  IMPRESSION:  s/p tracheostomy tube placement due to recurrent angioedema of unknown cause and history of alpha gal  PLAN:  Anticipate mild increased bleeding with the amount of movements patient is doing and discussed with family.  Hopefully will be able to come off of ventilatory support and then cuff deflated soon.  Will place referral for Immunology Dr. Rada Hay at [facility].  Carsin Randazzo 02/07/2023, 1:31 PM

## 2023-02-07 NOTE — Progress Notes (Addendum)
0800 Trach care and inner cannula changed per RT. Sputum thick and yellow in color. Wake up postponed until 1000 when family arrives. 0930 Sedation cut in half in preparation for wake. 1035 Sedation- Propofol and Fentanyl stopped for wake up. Waking up slowly. 1100 Patient awake. MAE. Watching family. 1126 Treated for high blood pressure. Patient wide awake turning head to voice. Appears to recognize family. 1200 Patient becoming agitated. Medicated for pain. 1308 Medicated for agitation. 1315 Calmer since medicated.  1427 Agitated again. Notified D.Nelson NP. Started on Precedex IV. 1445 Calmer now. Tube feeding  started per orders. 1800 Tolerating  30% 5/5 but orders to rest patient overnight received. Urine still looks brown with sediment.

## 2023-02-07 NOTE — Anesthesia Postprocedure Evaluation (Deleted)
Anesthesia Post Note  Patient: Sharon [name]  Procedure(s) Performed: TRACHEOSTOMY (Throat)  Patient location during evaluation: Mother Baby Anesthesia Type: General Level of consciousness: awake and alert Pain management: pain level controlled Vital Signs Assessment: post-procedure vital signs reviewed and stable Respiratory status: spontaneous breathing, nonlabored ventilation and respiratory function stable Cardiovascular status: stable Postop Assessment: no headache, no backache and epidural receding Anesthetic complications: no  No notable events documented.   Last Vitals:  Vitals:   02/07/23 0500 02/07/23 0600  BP: 112/63 97/67  Pulse: (!) 56 (!) 58  Resp: 10 (!) 9  Temp:    SpO2: 97% 97%    Last Pain:  Vitals:   02/07/23 0400  TempSrc: Oral  PainSc:                  Elmarie Mainland

## 2023-02-07 NOTE — Progress Notes (Signed)
PHARMACY CONSULT NOTE  Pharmacy Consult for Electrolyte Monitoring and Replacement   Recent Labs: Potassium (mmol/L)  Date Value  02/07/2023 3.5   Magnesium (mg/dL)  Date Value  46/96/2952 2.6 (H)   Calcium (mg/dL)  Date Value  84/13/2440 8.0 (L)   Albumin (g/dL)  Date Value  02/18/2535 2.7 (L)   Phosphorus (mg/dL)  Date Value  64/40/3474 3.1   Sodium (mmol/L)  Date Value  02/07/2023 139   Corrected calcium: 9.04 mg/dL  Assessment: 72 year old female with history of recurrent angioedema admitted to CCU after recurrent angioedema episode. Patient is ventilated and sedated on fentanyl infusion and propofol.  Goal of Therapy:  Electrolytes within normal limits  Plan:  No replacement warranted Follow up electrolytes tomorrow AM  Littie Deeds, PharmD Pharmacy Resident  02/07/2023 6:55 AM

## 2023-02-08 DIAGNOSIS — T783XXA Angioneurotic edema, initial encounter: Secondary | ICD-10-CM | POA: Diagnosis not present

## 2023-02-08 LAB — CBC
HCT: 30.7 % — ABNORMAL LOW (ref 36.0–46.0)
Hemoglobin: 9.4 g/dL — ABNORMAL LOW (ref 12.0–15.0)
MCH: 27.8 pg (ref 26.0–34.0)
MCHC: 30.6 g/dL (ref 30.0–36.0)
MCV: 90.8 fL (ref 80.0–100.0)
Platelets: 274 10*3/uL (ref 150–400)
RBC: 3.38 MIL/uL — ABNORMAL LOW (ref 3.87–5.11)
RDW: 15.1 % (ref 11.5–15.5)
WBC: 11.1 10*3/uL — ABNORMAL HIGH (ref 4.0–10.5)
nRBC: 0 % (ref 0.0–0.2)

## 2023-02-08 LAB — GLUCOSE, CAPILLARY
Glucose-Capillary: 129 mg/dL — ABNORMAL HIGH (ref 70–99)
Glucose-Capillary: 182 mg/dL — ABNORMAL HIGH (ref 70–99)
Glucose-Capillary: 128 mg/dL — ABNORMAL HIGH (ref 70–99)
Glucose-Capillary: 149 mg/dL — ABNORMAL HIGH (ref 70–99)
Glucose-Capillary: 141 mg/dL — ABNORMAL HIGH (ref 70–99)
Glucose-Capillary: 132 mg/dL — ABNORMAL HIGH (ref 70–99)

## 2023-02-08 LAB — BASIC METABOLIC PANEL
Anion gap: 7 (ref 5–15)
BUN: 25 mg/dL — ABNORMAL HIGH (ref 8–23)
CO2: 25 mmol/L (ref 22–32)
Calcium: 8.2 mg/dL — ABNORMAL LOW (ref 8.9–10.3)
Chloride: 110 mmol/L (ref 98–111)
Creatinine, Ser: 0.6 mg/dL (ref 0.44–1.00)
GFR, Estimated: >60 mL/min (ref >60)
Glucose, Bld: 158 mg/dL — ABNORMAL HIGH (ref 70–99)
Potassium: 3.1 mmol/L — ABNORMAL LOW (ref 3.5–5.1)
Sodium: 142 mmol/L (ref 135–145)

## 2023-02-08 LAB — PHOSPHORUS: Phosphorus: 2.5 mg/dL (ref 2.5–4.6)

## 2023-02-08 LAB — MAGNESIUM: Magnesium: 2.4 mg/dL (ref 1.7–2.4)

## 2023-02-08 MED ORDER — POTASSIUM CHLORIDE 10 MEQ/50ML IV SOLN
10.0000 meq | INTRAVENOUS | Status: AC
  Administered 2023-02-08 (×4): 10 meq via INTRAVENOUS
  Filled 2023-02-08 (×4): qty 50

## 2023-02-08 MED ORDER — HYDRALAZINE HCL 20 MG/ML IJ SOLN
10.0000 mg | Freq: Four times a day (QID) | INTRAMUSCULAR | Status: DC | PRN
  Administered 2023-02-08 – 2023-02-09 (×3): 20 mg via INTRAVENOUS
  Filled 2023-02-08 (×3): qty 1

## 2023-02-08 MED ORDER — DIAZEPAM 5 MG PO TABS
2.5000 mg | ORAL_TABLET | Freq: Three times a day (TID) | ORAL | Status: DC
  Administered 2023-02-08 – 2023-02-09 (×4): 2.5 mg
  Filled 2023-02-08 (×4): qty 1

## 2023-02-08 MED ORDER — ACETAMINOPHEN 325 MG PO TABS: 650.0000 mg | ORAL_TABLET | ORAL | Status: DC | PRN

## 2023-02-08 MED ORDER — DIAZEPAM 5 MG PO TABS: 2.5000 mg | ORAL_TABLET | Freq: Three times a day (TID) | ORAL | Status: DC | PRN

## 2023-02-08 MED ORDER — POTASSIUM CHLORIDE 20 MEQ PO PACK: 40.0000 meq | PACK | Freq: Once | ORAL | Status: DC

## 2023-02-08 MED ORDER — POLYETHYLENE GLYCOL 3350 17 G PO PACK: 17.0000 g | PACK | Freq: Every day | ORAL | Status: DC | PRN

## 2023-02-08 NOTE — Progress Notes (Signed)
NAME:  Sharon [name], MRN:  [id number], DOB:  December 31, 1950, LOS: 7 ADMISSION DATE:  02/01/2023  CHIEF COMPLAINT:  Acute respiratory failure secondary to angioedema    History of Present Illness:   72 year old female with a history of recurrent angioedema, husband says she has been to the hospital least 14 times in the last year for angioedema.  She was intubated one other time before this.  She is not on an ACE inhibitor and not on an ARB drug.   ENT called to bedside Patient in obvious distress no stridor noted however she has significant angioedema of the floor mouth and tongue with the tongue protruding from the mouth.  She is unable to phonate without significant difficulty.  External ears appear normal with anterior nose benign oral cavity as above.  Patient was decongested with Afrin spray.  A flexible fiberoptic laryngoscope was introduced through the right nostril.  Examination of the posterior pharynx showed some uvular edema.  The tongue base that there is significant swelling was also significant edema of the epiglottis the bilateral arytenoids.  The vocal folds were visualized.  The subglottis and trachea appeared normal.   Patient was taken to OR for emergent nasal intubation   Case Discussed with ENT [facility] was placed 02/06/23 SAT successful yesterday 02/07/23 The patient remains on the ventilator via trach Possibility of LTAC discussed with family 10/16     Wound Check No wounds or pressure ulcers noted upon admission to ICU  Pertinent  Medical History  Previous history of ANGIOEDEMA  Significant Hospital Events: Including procedures, antibiotic start and stop dates in addition to other pertinent events   10/10 admitted for severe resp failure for severe angioedema 10/11: remains intubated  10/12 remains intubated and sedated 10/13 remains nasally intubated and sedated 10/14 proceed with [facility] 10/15 TRACH planned for today with ENT surgery 10/16 SAT/SBT planned,  fentanyl and propofol d/c PRECEDEX started for agitation 10/17 Patient awake and following commands, remains on VENT     Antimicrobials:   Antibiotics Given (last 72 hours)     None           Interim History / Subjective:  + Agitation Patient remains critically ill     Objective   Blood pressure 136/79, pulse 83, temperature 99.3 F (37.4 C), temperature source Axillary, resp. rate (!) 22, height 5' 6.5" (1.689 m), weight 97.5 kg, SpO2 99%.    Vent Mode: PSV FiO2 (%):  [28 %-30 %] 30 % PEEP:  [5 cmH20] 5 cmH20 Pressure Support:  [5 cmH20] 5 cmH20   Intake/Output Summary (Last 24 hours) at 02/08/2023 0753 Last data filed at 02/08/2023 0600 Gross per 24 hour  Intake 1183.09 ml  Output 1100 ml  Net 83.09 ml   Filed Weights   02/01/23 1222  Weight: 97.5 kg     REVIEW OF SYSTEMS  PATIENT IS UNABLE TO PROVIDE COMPLETE REVIEW OF SYSTEMS DUE TO SEVERE CRITICAL ILLNESS   PHYSICAL EXAMINATION:  GENERAL:critically ill appearing, +resp distress EYES: Pupils equal, round, reactive to light.  No scleral icterus.  MOUTH: Dobhoff in place,  lips/tongue swelling improved, mildly edematous  NECK: Supple. Size 6 cuffed Shiley placed between tracheal rings 2 and 3.  PULMONARY: Lungs clear to auscultation, +rhonchi,  CARDIOVASCULAR: S1 and S2.  Regular rate and rhythm GASTROINTESTINAL: Soft, nontender, -distended. Positive bowel sounds.  MUSCULOSKELETAL: No swelling, clubbing, or edema.  NEUROLOGIC: sedated, follows commands, REU weakness 3/5 strength SKIN:normal, warm to touch, Capillary refill delayed  Pulses  present bilaterally GU: external catheter in place  EXTREMITIES: PICC line in place LUE, PIV in RUE both dressings are clean dry and intact   No wounds or pressure ulcers noted upon admission to ICU    ASSESSMENT AND PLAN SYNOPSIS  72 yo white female with multiple episodes of angioedema with acute laryngeal edema due to  angioedema leading to severe resp  failure ANGIOEDEMA WORK UP HAS BEEN NEGATIVE   Severe ACUTE Hypoxic and Hypercapnic Respiratory Failure -continue Mechanical Ventilator support -Wean Fio2 and PEEP as tolerated -VAP/VENT bundle implementation - Wean PEEP & FiO2 as tolerated, maintain SpO2 > 88% - Head of bed elevated 30 degrees, VAP protocol in place - Plateau pressures less than 30 cm H20  - Intermittent chest x-ray & ABG PRN - Ensure adequate pulmonary hygiene  Patient needs TRACH to survive Family has consented TA- MODERATE Normal respiratory flora-no Staph aureus or Pseudomonas seen  Will repeat RESP CX's   SEVERE ANGIOEDEMA TRACH placed 10/15 STEROIDS completed AND H2 blockers    ACUTE KIDNEY INJURY/Renal Failure-improving -Foley Catheter discontinued, external catheter in place -Avoid nephrotoxic agents -Follow urine output, BMP -Ensure adequate renal perfusion, optimize oxygenation -Renal dose medications   Intake/Output Summary (Last 24 hours) at 02/08/2023 0936 Last data filed at 02/08/2023 0600 Gross per 24 hour  Intake 983.09 ml  Output 1050 ml  Net -66.91 ml      Latest Ref Rng & Units 02/08/2023    3:55 AM 02/07/2023    4:44 AM 02/06/2023    4:31 AM  BMP  Glucose 70 - 99 mg/dL 161  096  045   BUN 8 - 23 mg/dL 25  31  29    Creatinine 0.44 - 1.00 mg/dL 4.09  8.11  9.14   Sodium 135 - 145 mmol/L 142  139  140   Potassium 3.5 - 5.1 mmol/L 3.1  3.5  3.7   Chloride 98 - 111 mmol/L 110  109  109   CO2 22 - 32 mmol/L 25  23  24    Calcium 8.9 - 10.3 mg/dL 8.2  8.0  8.1        CARDIAC ICU monitoring  SINUS BRADYCARDIA -resolved -BP remains stable -Continue tele monitoring    NEUROLOGY She remains comfortable Goal RASS -2 to -3 WUA started on 10/17 Wean precedex as tolerated Start Valium 2.5 TID scheduled Started OXY 5mg  every 6hrs     ENDO - ICU hypoglycemic\Hyperglycemia protocol -check FSBS per protocol     GI GI PROPHYLAXIS as indicated   NUTRITIONAL  STATUS DIET-->tube feeds, started 10/16 Dophoff in place, right nare Constipation protocol as indicated     ELECTROLYTES -follow labs as needed -replace as needed -pharmacy consultation and following   ACUTE ANEMIA TRANSFUSE AS NEEDED CONSIDER TRANSFUSION  IF HGB<7         Best practice (right click and "Reselect all SmartList Selections" daily)  Diet: Tube feeds stared 02/07/23 Pain/Anxiety/Delirium protocol (if indicated): Yes (RASS goal -3) VAP protocol (if indicated): Yes DVT prophylaxis: LMWH GI prophylaxis: H2B Mobility:  bed rest  Code Status:  FULL Disposition:ICU  Labs   CBC: Recent Labs  Lab 02/01/23 1223 02/02/23 0306 02/04/23 0331 02/05/23 0425 02/06/23 0431 02/07/23 0444 02/08/23 0355  WBC 9.1   < > 9.4 8.2 9.0 10.6* 11.1*  NEUTROABS 5.7  --   --   --   --   --   --   HGB 11.0*   < > 8.8* 8.6* 9.1* 9.8* 9.4*  HCT 34.0*   < > 27.7* 28.6* 29.1* 32.1* 30.7*  MCV 89.9   < > 90.2 93.8 91.5 91.7 90.8  PLT 278   < > 185 200 220 292 274   < > = values in this interval not displayed.    Basic Metabolic Panel: Recent Labs  Lab 02/02/23 0306 02/03/23 0458 02/04/23 0331 02/05/23 0425 02/06/23 0431 02/07/23 0444 02/08/23 0355  NA 139   < > 140 143 140 139 142  K 4.0   < > 3.7 3.9 3.7 3.5 3.1*  CL 102   < > 108 108 109 109 110  CO2 23   < > 23 24 24 23 25   GLUCOSE 158*   < > 153* 146* 139* 116* 158*  BUN 20   < > 35* 30* 29* 31* 25*  CREATININE 0.94   < > 1.13* 0.84 0.71 0.64 0.60  CALCIUM 9.3   < > 8.3* 8.5* 8.1* 8.0* 8.2*  MG 2.0  --  2.3 2.6*  --  2.6* 2.4  PHOS 3.7   < > 3.4 2.6 2.5 3.1 2.5   < > = values in this interval not displayed.       Past Medical History:  She,  has a past medical history of Angio-edema, Anxiety, Cataract, GERD (gastroesophageal reflux disease), Hyperlipidemia, Hypertension, Hypothyroidism, and Vitamin D deficiency.   Surgical History:   Past Surgical History:  Procedure Laterality Date   CATARACT  EXTRACTION W/ INTRAOCULAR LENS IMPLANT Right 12/29/2016   Dr. Mia Creek   CATARACT EXTRACTION W/ INTRAOCULAR LENS IMPLANT Left 01/15/2017   Dr. Marcial Pacas Bevis   COLONOSCOPY     INTUBATION-ENDOTRACHEAL WITH TRACHEOSTOMY STANDBY N/A 02/01/2023   Procedure: INTUBATION-ENDOTRACHEAL WITH TRACHEOSTOMY STANDBY;  Surgeon: Linus Salmons, MD;  Location: [facility] ORS;  Service: ENT;  Laterality: N/A;   LEEP     TRACHEOSTOMY TUBE PLACEMENT N/A 02/06/2023   Procedure: TRACHEOSTOMY;  Surgeon: Bud Face, MD;  Location: [facility] ORS;  Service: ENT;  Laterality: N/A;   TUBAL LIGATION     VEIN LIGATION AND STRIPPING     x2     Social History:   reports that she quit smoking about 26 years ago. Her smoking use included cigarettes. She has never used smokeless tobacco. She reports current alcohol use of about 4.0 standard drinks of alcohol per week. She reports that she does not use drugs.   Family History:  Her family history includes Hypertension in her father and mother; Transient ischemic attack in her mother. There is no history of Colon cancer, Esophageal cancer, Rectal cancer, or Stomach cancer.   Allergies Allergies  Allergen Reactions   Cozaar [Losartan] Swelling   Septra [Sulfamethoxazole-Trimethoprim] Anaphylaxis   Zinc Gelatin [Zinc] Anaphylaxis   Alpha-Gal    Amlodipine Besylate     REACTION: edema   Atorvastatin     REACTION: muscle pain   Beef Allergy    Cat Hair Extract    Dust Mite Extract    Neomycin-Bacitracin Zn-Polymyx     REACTION: rash Per pt not sure if from meds or not       DVT/GI PRX  assessed I Assessed the need for Labs I Assessed the need for Foley I Assessed the need for Central Venous Line Family Discussion when available I Assessed the need for Mobilization I made an Assessment of medications to be adjusted accordingly Safety Risk assessment completed  CASE DISCUSSED IN MULTIDISCIPLINARY ROUNDS WITH ICU TEAM     Critical Care Time devoted  to  patient care services described in this note is 55 minutes.   Critical care was necessary to treat /prevent imminent and life-threatening deterioration.   Patient is critically ill. Patient with Multiorgan failure and at high risk for cardiac arrest and death.    Lucie Leather, M.D.  [facility]  Medical Director [facility] Medical Director [facility] Cardio-Pulmonary Department

## 2023-02-08 NOTE — Plan of Care (Signed)
  Problem: Clinical Measurements: Goal: Respiratory complications will improve Outcome: Progressing   Problem: Nutrition: Goal: Adequate nutrition will be maintained Outcome: Progressing   Problem: Safety: Goal: Ability to remain free from injury will improve Outcome: Progressing   Problem: Respiratory: Goal: Ability to maintain a clear airway and adequate ventilation will improve Outcome: Progressing

## 2023-02-08 NOTE — Progress Notes (Signed)
PHARMACY CONSULT NOTE  Pharmacy Consult for Electrolyte Monitoring and Replacement   Recent Labs: Potassium (mmol/L)  Date Value  02/08/2023 3.1 (L)   Magnesium (mg/dL)  Date Value  16/01/9603 2.4   Calcium (mg/dL)  Date Value  54/12/8117 8.2 (L)   Albumin (g/dL)  Date Value  14/78/2956 2.7 (L)   Phosphorus (mg/dL)  Date Value  21/30/8657 2.5   Sodium (mmol/L)  Date Value  02/08/2023 142   Corrected calcium: 9.24 mg/dL  Assessment: 72 year old female with history of recurrent angioedema admitted to CCU after recurrent angioedema episode. Patient is ventilated and sedated on fentanyl infusion and propofol.  Goal of Therapy:  Electrolytes within normal limits  Plan:  K 3.1; KCl 40 mEq per tube x 1 plus KCl 10 mEq IV x 4 ordered by NP No other electrolyte replacement warranted at this time  Follow up electrolytes tomorrow AM  Littie Deeds, PharmD Pharmacy Resident  02/08/2023 6:59 AM

## 2023-02-08 NOTE — TOC Initial Note (Addendum)
Transition of Care [facility]) - Initial/Assessment Note    Patient Details  Name: Sharon [name] MRN: [id number] Date of Birth: Aug 16, 1950  Transition of Care [facility]) CM/SW Contact:    Margarito Liner, LCSW Phone Number: 02/08/2023, 10:57 AM  Clinical Narrative:  CSW met with patient's husband and two other family members at bedside. CSW introduced role and explained that discharge planning would be discussed. MD discussed LTACH with them. CSW provided CMS list. Husband prefers [facility] or [facility]. Both facility admissions coordinators are reviewing the referral.           1:22 pm: [facility]. Husband chose [facility] as he is familiar with the facility and area nearby. Admissions coordinator is aware. She will need 3 documented weaning trials and an MD note stating why patient is appropriate for LTACH. CSW sent information to MD and NP.       Expected Discharge Plan: Long Term Acute Care (LTAC) Barriers to Discharge: Continued Medical Work up   Patient Goals and CMS Choice   CMS Medicare.gov Compare Post Acute Care list provided to:: Patient Represenative (must comment) (Husband)        Expected Discharge Plan and Services     Post Acute Care Choice: Long Term Acute Care (LTAC) Living arrangements for the past 2 months: Single Family Home                                      Prior Living Arrangements/Services Living arrangements for the past 2 months: Single Family Home Lives with:: Spouse Patient language and need for interpreter reviewed:: Yes        Need for Family Participation in Patient Care: Yes (Comment) Care giver support system in place?: Yes (comment)   Criminal Activity/Legal Involvement Pertinent to Current Situation/Hospitalization: No - Comment as needed  Activities of Daily Living   ADL Screening (condition at time of admission) Independently performs ADLs?: Yes (appropriate for developmental age) Is the patient deaf or  have difficulty hearing?: No Does the patient have difficulty seeing, even when wearing glasses/contacts?: No Does the patient have difficulty concentrating, remembering, or making decisions?: No  Permission Sought/Granted Permission sought to share information with : Facility Medical sales representative, Family Supports    Share Information with NAME: Silvanna Prickett  Permission granted to share info w AGENCY: LTACH's  Permission granted to share info w Relationship: Husband  Permission granted to share info w Contact Information: [phone]  Emotional Assessment Appearance:: Appears stated age Attitude/Demeanor/Rapport: Unable to Assess Affect (typically observed): Unable to Assess   Alcohol / Substance Use: Not Applicable Psych Involvement: No (comment)  Admission diagnosis:  Angioedema [T78.3XXA] Angioedema, initial encounter [T78.3XXA] Patient Active Problem List   Diagnosis Date Noted   Chronic diastolic heart failure (HCC) 01/09/2023   Snoring 01/09/2023   Vitamin B12 deficiency 01/09/2023   Current use of proton pump inhibitor 01/01/2023   Angioedema due to angiotensin converting enzyme inhibitor (ACE-I) 02/27/2022   SVT (supraventricular tachycardia) (HCC) 02/22/2022   Urinary incontinence 12/09/2021   Pedal edema 07/26/2021   Angioedema 04/17/2021   Dizziness 08/23/2020   Fatigue 08/23/2020   Prediabetes 12/17/2019   Medicare annual wellness visit, subsequent 08/28/2018   Cough 01/28/2018   Welcome to Medicare preventive visit 08/22/2016   Obesity 10/05/2014   Anemia 09/24/2013   Palpitations 03/03/2013   Colon cancer screening 03/03/2013   Hypokalemia 10/09/2011   Routine  general medical examination at a health care facility 10/02/2011   Vitamin D deficiency disease 12/28/2010   ARTHRALGIA 08/19/2007   Hypothyroidism 01/31/2007   Hyperlipidemia 01/31/2007   Anxiety state 01/31/2007   Essential hypertension 01/31/2007   GERD 01/31/2007   PCP:  Judy Pimple,  MD Pharmacy:   [facility] [phone] - [address] [place] - [address] [place] [address] Phone: [phone] Fax: [phone]  [facility]) - [place] - [address] [facility] [address] [place] [address] [place] [address] Phone: [phone] Fax: [phone]  [facility] - [place], [place] - [address] [place] [address] Phone: [phone] Fax: [phone]     Social Determinants of Health (SDOH) Social History: SDOH Screenings   Food Insecurity: Unknown (02/07/2023)  Housing: Low Risk  (02/07/2023)  Transportation Needs: No Transportation Needs (02/07/2023)  Utilities: Not At Risk (02/07/2023)  Alcohol Screen: Low Risk  (02/21/2022)  Depression (PHQ2-9): Low Risk  (06/09/2022)  Financial Resource Strain: Low Risk  (02/21/2022)  Physical Activity: Insufficiently Active (02/21/2022)  Social Connections: Socially Isolated (02/21/2022)  Stress: No Stress Concern Present (02/21/2022)  Tobacco Use: Medium Risk (02/01/2023)   SDOH Interventions:     Readmission Risk Interventions     No data to display

## 2023-02-08 NOTE — Plan of Care (Signed)
Vent weaning Attempts  10/15 [facility] obtained [facility]  10/16 Patient has failed VENT weaning attempts, increased PS support needed Patient with muscle weakness and resp muscle fatigue  10/17 patient has failed Vent weaning attempt, patient with increased WOB  Precedex started for agitation   Patient is a very good candidate for LTACH  Recommend LTACH referral and placement     Selah Zelman Santiago Glad, M.D.  [facility]  Medical Director [facility] Medical Director [facility] Cardio-Pulmonary Department

## 2023-02-09 ENCOUNTER — Inpatient Hospital Stay: Payer: Medicare Other

## 2023-02-09 DIAGNOSIS — L89326 Pressure-induced deep tissue damage of left buttock: Secondary | ICD-10-CM | POA: Diagnosis not present

## 2023-02-09 DIAGNOSIS — Z1159 Encounter for screening for other viral diseases: Secondary | ICD-10-CM | POA: Diagnosis not present

## 2023-02-09 DIAGNOSIS — Z7401 Bed confinement status: Secondary | ICD-10-CM | POA: Diagnosis not present

## 2023-02-09 DIAGNOSIS — R1311 Dysphagia, oral phase: Secondary | ICD-10-CM | POA: Diagnosis not present

## 2023-02-09 DIAGNOSIS — R262 Difficulty in walking, not elsewhere classified: Secondary | ICD-10-CM | POA: Diagnosis not present

## 2023-02-09 DIAGNOSIS — Z7982 Long term (current) use of aspirin: Secondary | ICD-10-CM | POA: Diagnosis not present

## 2023-02-09 DIAGNOSIS — Z931 Gastrostomy status: Secondary | ICD-10-CM | POA: Diagnosis not present

## 2023-02-09 DIAGNOSIS — L89616 Pressure-induced deep tissue damage of right heel: Secondary | ICD-10-CM | POA: Diagnosis not present

## 2023-02-09 DIAGNOSIS — R1312 Dysphagia, oropharyngeal phase: Secondary | ICD-10-CM | POA: Diagnosis not present

## 2023-02-09 DIAGNOSIS — L89150 Pressure ulcer of sacral region, unstageable: Secondary | ICD-10-CM | POA: Diagnosis not present

## 2023-02-09 DIAGNOSIS — Z87891 Personal history of nicotine dependence: Secondary | ICD-10-CM | POA: Diagnosis not present

## 2023-02-09 DIAGNOSIS — Z91014 Allergy to mammalian meats: Secondary | ICD-10-CM | POA: Diagnosis not present

## 2023-02-09 DIAGNOSIS — K2100 Gastro-esophageal reflux disease with esophagitis, without bleeding: Secondary | ICD-10-CM | POA: Diagnosis not present

## 2023-02-09 DIAGNOSIS — J9601 Acute respiratory failure with hypoxia: Secondary | ICD-10-CM | POA: Diagnosis not present

## 2023-02-09 DIAGNOSIS — Z79899 Other long term (current) drug therapy: Secondary | ICD-10-CM | POA: Diagnosis not present

## 2023-02-09 DIAGNOSIS — Z4682 Encounter for fitting and adjustment of non-vascular catheter: Secondary | ICD-10-CM | POA: Diagnosis not present

## 2023-02-09 DIAGNOSIS — Z23 Encounter for immunization: Secondary | ICD-10-CM | POA: Diagnosis not present

## 2023-02-09 DIAGNOSIS — J9602 Acute respiratory failure with hypercapnia: Secondary | ICD-10-CM | POA: Diagnosis not present

## 2023-02-09 DIAGNOSIS — J384 Edema of larynx: Secondary | ICD-10-CM | POA: Diagnosis not present

## 2023-02-09 DIAGNOSIS — Z8249 Family history of ischemic heart disease and other diseases of the circulatory system: Secondary | ICD-10-CM | POA: Diagnosis not present

## 2023-02-09 DIAGNOSIS — R001 Bradycardia, unspecified: Secondary | ICD-10-CM | POA: Diagnosis not present

## 2023-02-09 DIAGNOSIS — T783XXD Angioneurotic edema, subsequent encounter: Secondary | ICD-10-CM | POA: Diagnosis not present

## 2023-02-09 DIAGNOSIS — E039 Hypothyroidism, unspecified: Secondary | ICD-10-CM | POA: Diagnosis not present

## 2023-02-09 DIAGNOSIS — K219 Gastro-esophageal reflux disease without esophagitis: Secondary | ICD-10-CM | POA: Diagnosis not present

## 2023-02-09 DIAGNOSIS — R6889 Other general symptoms and signs: Secondary | ICD-10-CM | POA: Diagnosis not present

## 2023-02-09 DIAGNOSIS — X58XXXA Exposure to other specified factors, initial encounter: Secondary | ICD-10-CM | POA: Diagnosis not present

## 2023-02-09 DIAGNOSIS — Z79890 Hormone replacement therapy (postmenopausal): Secondary | ICD-10-CM | POA: Diagnosis not present

## 2023-02-09 DIAGNOSIS — R509 Fever, unspecified: Secondary | ICD-10-CM | POA: Diagnosis not present

## 2023-02-09 DIAGNOSIS — T783XXS Angioneurotic edema, sequela: Secondary | ICD-10-CM | POA: Diagnosis not present

## 2023-02-09 DIAGNOSIS — R29818 Other symptoms and signs involving the nervous system: Secondary | ICD-10-CM | POA: Diagnosis not present

## 2023-02-09 DIAGNOSIS — R1310 Dysphagia, unspecified: Secondary | ICD-10-CM | POA: Diagnosis not present

## 2023-02-09 DIAGNOSIS — Z9911 Dependence on respirator [ventilator] status: Secondary | ICD-10-CM | POA: Diagnosis not present

## 2023-02-09 DIAGNOSIS — L89316 Pressure-induced deep tissue damage of right buttock: Secondary | ICD-10-CM | POA: Diagnosis not present

## 2023-02-09 DIAGNOSIS — R451 Restlessness and agitation: Secondary | ICD-10-CM | POA: Diagnosis not present

## 2023-02-09 DIAGNOSIS — G9340 Encephalopathy, unspecified: Secondary | ICD-10-CM | POA: Diagnosis not present

## 2023-02-09 DIAGNOSIS — Z881 Allergy status to other antibiotic agents status: Secondary | ICD-10-CM | POA: Diagnosis not present

## 2023-02-09 DIAGNOSIS — G8918 Other acute postprocedural pain: Secondary | ICD-10-CM | POA: Diagnosis not present

## 2023-02-09 DIAGNOSIS — Z6834 Body mass index (BMI) 34.0-34.9, adult: Secondary | ICD-10-CM | POA: Diagnosis not present

## 2023-02-09 DIAGNOSIS — G9349 Other encephalopathy: Secondary | ICD-10-CM | POA: Diagnosis not present

## 2023-02-09 DIAGNOSIS — Z883 Allergy status to other anti-infective agents status: Secondary | ICD-10-CM | POA: Diagnosis not present

## 2023-02-09 DIAGNOSIS — E785 Hyperlipidemia, unspecified: Secondary | ICD-10-CM | POA: Diagnosis not present

## 2023-02-09 DIAGNOSIS — R0989 Other specified symptoms and signs involving the circulatory and respiratory systems: Secondary | ICD-10-CM | POA: Diagnosis not present

## 2023-02-09 DIAGNOSIS — J9621 Acute and chronic respiratory failure with hypoxia: Secondary | ICD-10-CM | POA: Diagnosis not present

## 2023-02-09 DIAGNOSIS — J9691 Respiratory failure, unspecified with hypoxia: Secondary | ICD-10-CM | POA: Diagnosis not present

## 2023-02-09 DIAGNOSIS — T783XXA Angioneurotic edema, initial encounter: Secondary | ICD-10-CM | POA: Diagnosis not present

## 2023-02-09 DIAGNOSIS — J398 Other specified diseases of upper respiratory tract: Secondary | ICD-10-CM | POA: Diagnosis not present

## 2023-02-09 DIAGNOSIS — J45998 Other asthma: Secondary | ICD-10-CM | POA: Diagnosis not present

## 2023-02-09 DIAGNOSIS — J9690 Respiratory failure, unspecified, unspecified whether with hypoxia or hypercapnia: Secondary | ICD-10-CM | POA: Diagnosis not present

## 2023-02-09 DIAGNOSIS — I6782 Cerebral ischemia: Secondary | ICD-10-CM | POA: Diagnosis not present

## 2023-02-09 DIAGNOSIS — M6281 Muscle weakness (generalized): Secondary | ICD-10-CM | POA: Diagnosis not present

## 2023-02-09 DIAGNOSIS — J9502 Infection of tracheostomy stoma: Secondary | ICD-10-CM | POA: Diagnosis not present

## 2023-02-09 DIAGNOSIS — G9389 Other specified disorders of brain: Secondary | ICD-10-CM | POA: Diagnosis not present

## 2023-02-09 DIAGNOSIS — E669 Obesity, unspecified: Secondary | ICD-10-CM | POA: Diagnosis not present

## 2023-02-09 DIAGNOSIS — R5381 Other malaise: Secondary | ICD-10-CM | POA: Diagnosis not present

## 2023-02-09 DIAGNOSIS — D62 Acute posthemorrhagic anemia: Secondary | ICD-10-CM | POA: Diagnosis not present

## 2023-02-09 DIAGNOSIS — Z961 Presence of intraocular lens: Secondary | ICD-10-CM | POA: Diagnosis not present

## 2023-02-09 DIAGNOSIS — R2689 Other abnormalities of gait and mobility: Secondary | ICD-10-CM | POA: Diagnosis not present

## 2023-02-09 DIAGNOSIS — N179 Acute kidney failure, unspecified: Secondary | ICD-10-CM | POA: Diagnosis not present

## 2023-02-09 DIAGNOSIS — R109 Unspecified abdominal pain: Secondary | ICD-10-CM | POA: Diagnosis not present

## 2023-02-09 DIAGNOSIS — E559 Vitamin D deficiency, unspecified: Secondary | ICD-10-CM | POA: Diagnosis not present

## 2023-02-09 DIAGNOSIS — Z930 Tracheostomy status: Secondary | ICD-10-CM | POA: Diagnosis not present

## 2023-02-09 DIAGNOSIS — F419 Anxiety disorder, unspecified: Secondary | ICD-10-CM | POA: Diagnosis not present

## 2023-02-09 DIAGNOSIS — L89626 Pressure-induced deep tissue damage of left heel: Secondary | ICD-10-CM | POA: Diagnosis not present

## 2023-02-09 DIAGNOSIS — I10 Essential (primary) hypertension: Secondary | ICD-10-CM | POA: Diagnosis not present

## 2023-02-09 DIAGNOSIS — Z888 Allergy status to other drugs, medicaments and biological substances status: Secondary | ICD-10-CM | POA: Diagnosis not present

## 2023-02-09 LAB — CBC
HCT: 27.9 % — ABNORMAL LOW (ref 36.0–46.0)
Hemoglobin: 8.9 g/dL — ABNORMAL LOW (ref 12.0–15.0)
MCH: 28.3 pg (ref 26.0–34.0)
MCHC: 31.9 g/dL (ref 30.0–36.0)
MCV: 88.9 fL (ref 80.0–100.0)
Platelets: 250 10*3/uL (ref 150–400)
RBC: 3.14 MIL/uL — ABNORMAL LOW (ref 3.87–5.11)
RDW: 15.4 % (ref 11.5–15.5)
WBC: 10.8 10*3/uL — ABNORMAL HIGH (ref 4.0–10.5)
nRBC: 0 % (ref 0.0–0.2)

## 2023-02-09 LAB — BASIC METABOLIC PANEL
Anion gap: 8 (ref 5–15)
BUN: 25 mg/dL — ABNORMAL HIGH (ref 8–23)
CO2: 24 mmol/L (ref 22–32)
Calcium: 8.3 mg/dL — ABNORMAL LOW (ref 8.9–10.3)
Chloride: 111 mmol/L (ref 98–111)
Creatinine, Ser: 0.69 mg/dL (ref 0.44–1.00)
GFR, Estimated: >60 mL/min (ref >60)
Glucose, Bld: 134 mg/dL — ABNORMAL HIGH (ref 70–99)
Potassium: 3.6 mmol/L (ref 3.5–5.1)
Sodium: 143 mmol/L (ref 135–145)

## 2023-02-09 LAB — PHOSPHORUS: Phosphorus: 2.6 mg/dL (ref 2.5–4.6)

## 2023-02-09 LAB — GLUCOSE, CAPILLARY
Glucose-Capillary: 131 mg/dL — ABNORMAL HIGH (ref 70–99)
Glucose-Capillary: 107 mg/dL — ABNORMAL HIGH (ref 70–99)
Glucose-Capillary: 121 mg/dL — ABNORMAL HIGH (ref 70–99)
Glucose-Capillary: 119 mg/dL — ABNORMAL HIGH (ref 70–99)

## 2023-02-09 LAB — MAGNESIUM: Magnesium: 2.5 mg/dL — ABNORMAL HIGH (ref 1.7–2.4)

## 2023-02-09 MED ORDER — HYDRALAZINE HCL 20 MG/ML IJ SOLN: 10.0000 mg | Freq: Four times a day (QID) | INTRAMUSCULAR | Status: DC | PRN

## 2023-02-09 MED ORDER — FAMOTIDINE 20 MG PO TABS: 20.0000 mg | ORAL_TABLET | Freq: Every day | ORAL | Status: DC

## 2023-02-09 MED ORDER — QUETIAPINE FUMARATE 25 MG PO TABS: 25.0000 mg | ORAL_TABLET | Freq: Two times a day (BID) | ORAL | Status: DC

## 2023-02-09 MED ORDER — CLONIDINE HCL 0.1 MG PO TABS
0.2000 mg | ORAL_TABLET | Freq: Four times a day (QID) | ORAL | Status: DC
  Administered 2023-02-09: 0.2 mg
  Filled 2023-02-09: qty 2

## 2023-02-09 MED ORDER — IPRATROPIUM-ALBUTEROL 0.5-2.5 (3) MG/3ML IN SOLN: 3.0000 mL | RESPIRATORY_TRACT | Status: DC | PRN

## 2023-02-09 MED ORDER — FONDAPARINUX SODIUM 2.5 MG/0.5ML ~~LOC~~ SOLN: 2.5000 mg | SUBCUTANEOUS | Status: DC

## 2023-02-09 MED ORDER — DIAZEPAM 5 MG PO TABS: 2.5000 mg | ORAL_TABLET | Freq: Three times a day (TID) | ORAL | 0 refills | Status: DC

## 2023-02-09 MED ORDER — CLONIDINE HCL 0.1 MG PO TABS: 0.2000 mg | ORAL_TABLET | Freq: Two times a day (BID) | ORAL | Status: DC

## 2023-02-09 MED ORDER — DEXMEDETOMIDINE HCL IN NACL 400 MCG/100ML IV SOLN: 0.4000 ug/kg/h | INTRAVENOUS | Status: DC

## 2023-02-09 MED ORDER — ONDANSETRON HCL 4 MG/2ML IJ SOLN: 4.0000 mg | Freq: Four times a day (QID) | INTRAMUSCULAR | 0 refills | Status: DC | PRN

## 2023-02-09 MED ORDER — QUETIAPINE FUMARATE 25 MG PO TABS
25.0000 mg | ORAL_TABLET | Freq: Two times a day (BID) | ORAL | Status: DC
  Administered 2023-02-09: 25 mg
  Filled 2023-02-09: qty 1

## 2023-02-09 MED ORDER — BISACODYL 10 MG RE SUPP
10.0000 mg | Freq: Once | RECTAL | Status: AC
  Administered 2023-02-09: 10 mg via RECTAL
  Filled 2023-02-09: qty 1

## 2023-02-09 MED ORDER — DOCUSATE SODIUM 50 MG/5ML PO LIQD: 100.0000 mg | Freq: Two times a day (BID) | ORAL | 0 refills | Status: DC

## 2023-02-09 MED ORDER — BISACODYL 10 MG RE SUPP: 10.0000 mg | Freq: Every day | RECTAL | 0 refills | Status: DC | PRN

## 2023-02-09 MED ORDER — OXYCODONE HCL 5 MG PO TABS: 5.0000 mg | ORAL_TABLET | Freq: Four times a day (QID) | ORAL | 0 refills | Status: DC

## 2023-02-09 MED ORDER — CLONIDINE HCL 0.1 MG PO TABS: 0.2000 mg | ORAL_TABLET | Freq: Every day | ORAL | Status: DC

## 2023-02-09 MED ORDER — CLONIDINE HCL 0.1 MG PO TABS: 0.2000 mg | ORAL_TABLET | Freq: Three times a day (TID) | ORAL | Status: DC

## 2023-02-09 NOTE — Progress Notes (Signed)
PHARMACY CONSULT NOTE  Pharmacy Consult for Electrolyte Monitoring and Replacement   Recent Labs: Potassium (mmol/L)  Date Value  02/09/2023 3.6   Magnesium (mg/dL)  Date Value  16/01/9603 2.5 (H)   Calcium (mg/dL)  Date Value  54/12/8117 8.3 (L)   Albumin (g/dL)  Date Value  14/78/2956 2.7 (L)   Phosphorus (mg/dL)  Date Value  21/30/8657 2.6   Sodium (mmol/L)  Date Value  02/09/2023 143   Corrected calcium: 9.34 mg/dL  Assessment: 72 year old female with history of recurrent angioedema admitted to CCU after recurrent angioedema episode. Patient is ventilated and sedated on fentanyl infusion and propofol.  Goal of Therapy:  Electrolytes within normal limits  Plan:  Electrolyte replacement not warranted at this time  Follow up electrolytes tomorrow AM  Littie Deeds, PharmD Pharmacy Resident  02/09/2023 6:42 AM

## 2023-02-09 NOTE — Progress Notes (Signed)
02/09/2023 12:50 PM  Clarisa Fling [id number]  Post-Op Day 3    Temp:  [98.7 F (37.1 C)-100 F (37.8 C)] 98.7 F (37.1 C) (10/18 0830) Pulse Rate:  [63-92] 85 (10/18 0900) Resp:  [14-25] 19 (10/18 0900) BP: (142-182)/(60-116) 143/113 (10/18 0900) SpO2:  [95 %-100 %] 95 % (10/18 1123) FiO2 (%):  [28 %] 28 % (10/18 1123) Weight:  [53.7 kg] 53.7 kg (10/18 0313),     Intake/Output Summary (Last 24 hours) at 02/09/2023 1250 Last data filed at 02/09/2023 1128 Gross per 24 hour  Intake 784.47 ml  Output 550 ml  Net 234.47 ml    Results for orders placed or performed during the hospital encounter of 02/01/23 (from the past 24 hour(s))  Glucose, capillary     Status: Abnormal   Collection Time: 02/08/23  3:14 PM  Result Value Ref Range   Glucose-Capillary 149 (H) 70 - 99 mg/dL  Glucose, capillary     Status: Abnormal   Collection Time: 02/08/23  8:02 PM  Result Value Ref Range   Glucose-Capillary 141 (H) 70 - 99 mg/dL  Glucose, capillary     Status: Abnormal   Collection Time: 02/08/23 11:17 PM  Result Value Ref Range   Glucose-Capillary 132 (H) 70 - 99 mg/dL  Glucose, capillary     Status: Abnormal   Collection Time: 02/09/23  3:24 AM  Result Value Ref Range   Glucose-Capillary 131 (H) 70 - 99 mg/dL  Basic metabolic panel     Status: Abnormal   Collection Time: 02/09/23  3:53 AM  Result Value Ref Range   Sodium 143 135 - 145 mmol/L   Potassium 3.6 3.5 - 5.1 mmol/L   Chloride 111 98 - 111 mmol/L   CO2 24 22 - 32 mmol/L   Glucose, Bld 134 (H) 70 - 99 mg/dL   BUN 25 (H) 8 - 23 mg/dL   Creatinine, Ser 6.44 0.44 - 1.00 mg/dL   Calcium 8.3 (L) 8.9 - 10.3 mg/dL   GFR, Estimated >03 >47 mL/min   Anion gap 8 5 - 15  Magnesium     Status: Abnormal   Collection Time: 02/09/23  3:53 AM  Result Value Ref Range   Magnesium 2.5 (H) 1.7 - 2.4 mg/dL  Phosphorus     Status: None   Collection Time: 02/09/23  3:53 AM  Result Value Ref Range   Phosphorus 2.6 2.5 - 4.6 mg/dL   CBC     Status: Abnormal   Collection Time: 02/09/23  3:53 AM  Result Value Ref Range   WBC 10.8 (H) 4.0 - 10.5 K/uL   RBC 3.14 (L) 3.87 - 5.11 MIL/uL   Hemoglobin 8.9 (L) 12.0 - 15.0 g/dL   HCT 42.5 (L) 95.6 - 38.7 %   MCV 88.9 80.0 - 100.0 fL   MCH 28.3 26.0 - 34.0 pg   MCHC 31.9 30.0 - 36.0 g/dL   RDW 56.4 33.2 - 95.1 %   Platelets 250 150 - 400 K/uL   nRBC 0.0 0.0 - 0.2 %  Glucose, capillary     Status: Abnormal   Collection Time: 02/09/23  8:19 AM  Result Value Ref Range   Glucose-Capillary 107 (H) 70 - 99 mg/dL    SUBJECTIVE: Patient more alert today has just returned from CT scan.  OBJECTIVE: Trach in position and functioning properly  IMPRESSION: Status post tracheostomy for angioedema and airway obstruction  PLAN: CT scan report is pending.  Spent a long time speaking with husband  today.  They have decided to let her recover at a rehab center hopefully [facility].  They have already spoken with home health about trach supplies and have worked some with respiratory therapy.  With this plan ENT will officially sign off, if there are any issues with the tracheostomy tube feel free to contact us.  When she is off the ventilator, respiratory therapy can let down the balloon on the trach tube and work with her about speaking around the tracheostomy tube.  Eventually we can downsize her to a 4 uncuffed tube in the future.  Her husband says that they are in contact with the immunologist at [facility] who worked her up for the angioedema in the past and will get an appointment back to see them for continued workup.  Davina Poke 02/09/2023, 12:50 PM

## 2023-02-09 NOTE — Progress Notes (Signed)
Patient's tube feeding held due to episode of vomiting.  Carmel Sacramento, RN

## 2023-02-09 NOTE — Progress Notes (Signed)
NAME:  Sharon [name], MRN:  [id number], DOB:  [birth year], LOS: 8 ADMISSION DATE:  02/01/2023  CHIEF COMPLAINT:  Acute respiratory failure secondary to angioedema    History of Present Illness:   72 year old female with a history of recurrent angioedema, husband says she has been to the hospital least 14 times in the last year for angioedema.  She was intubated one other time before this.  She is not on an ACE inhibitor and not on an ARB drug.   ENT called to bedside Patient in obvious distress no stridor noted however she has significant angioedema of the floor mouth and tongue with the tongue protruding from the mouth.  She is unable to phonate without significant difficulty.  External ears appear normal with anterior nose benign oral cavity as above.  Patient was decongested with Afrin spray.  A flexible fiberoptic laryngoscope was introduced through the right nostril.  Examination of the posterior pharynx showed some uvular edema.  The tongue base that there is significant swelling was also significant edema of the epiglottis the bilateral arytenoids.  The vocal folds were visualized.  The subglottis and trachea appeared normal.   Patient was taken to OR for emergent nasal intubation   Case Discussed with ENT [facility] was placed 02/06/23 SAT successful yesterday 02/07/23 The patient remains on the ventilator via trach Possibility of LTAC discussed with family 10/16 Weaning precedex in anticipation for transition to [facility] 10/18     Wound Check No wounds or pressure ulcers noted upon admission to ICU  Pertinent  Medical History  Previous history of ANGIOEDEMA  Significant Hospital Events: Including procedures, antibiotic start and stop dates in addition to other pertinent events   10/10 admitted for severe resp failure for severe angioedema 10/11: remains intubated  10/12 remains intubated and sedated 10/13 remains nasally intubated and sedated 10/14 proceed with [facility] 10/15  TRACH planned for today with ENT surgery 10/16 SAT/SBT planned, fentanyl and propofol d/c PRECEDEX started for agitation 10/17 Patient awake and following commands, remains on VENT 10/18 Patient is more alert today and follows commands, remains on VENT Weaning off Precedex     Antimicrobials:   Antibiotics Given (last 72 hours)     None           Interim History / Subjective:  + Agitation +Right sided UE weakness Patient remains critically ill  Plan for CT head   Objective   Blood pressure (!) 151/66, pulse 63, temperature 99.7 F (37.6 C), temperature source Oral, resp. rate 16, height 5' 6.5" (1.689 m), weight 53.7 kg, SpO2 99%.    Vent Mode: PSV FiO2 (%):  [28 %] 28 % PEEP:  [5 cmH20] 5 cmH20 Pressure Support:  [5 cmH20] 5 cmH20   Intake/Output Summary (Last 24 hours) at 02/09/2023 0806 Last data filed at 02/09/2023 7425 Gross per 24 hour  Intake 1161.01 ml  Output 550 ml  Net 611.01 ml   Filed Weights   02/01/23 1222 02/09/23 0313  Weight: 97.5 kg 53.7 kg     REVIEW OF SYSTEMS  PATIENT IS UNABLE TO PROVIDE COMPLETE REVIEW OF SYSTEMS DUE TO SEVERE CRITICAL ILLNESS   PHYSICAL EXAMINATION:  GENERAL:critically ill appearing, +resp distress EYES: Pupils equal, round, reactive to light.  No scleral icterus.  MOUTH: Dobhoff in place lips/tongue swelling improved, mildly edematous  NECK: Supple. Size 6 cuffed Shiley placed between tracheal rings 2 and 3.  PULMONARY: Lungs clear to auscultation, +rhonchi, +wheezing CARDIOVASCULAR: S1 and S2.  Regular rate and  rhythm GASTROINTESTINAL: Soft, nontender, -distended. Positive bowel sounds.  MUSCULOSKELETAL: No swelling, clubbing, or edema.  NEUROLOGIC: sedated, follows commands, REU weakness 3/5 strength SKIN:normal, warm to touch, Capillary refill delayed  Pulses present bilaterally GU: external catheter in place  EXTREMITIES: PICC line in place LUE, PIV in RUE both dressings are clean dry and intact    No wounds or pressure ulcers noted upon admission to ICU    ASSESSMENT AND PLAN SYNOPSIS  72 yo white female with multiple episodes of angioedema with acute laryngeal edema due to  angioedema leading to severe resp failure ANGIOEDEMA WORK UP HAS BEEN NEGATIVE   Severe ACUTE Hypoxic and Hypercapnic Respiratory Failure -continue Mechanical Ventilator support -Wean Fio2 and PEEP as tolerated -VAP/VENT bundle implementation - Wean PEEP & FiO2 as tolerated, maintain SpO2 > 88% - Head of bed elevated 30 degrees, VAP protocol in place - Plateau pressures less than 30 cm H20  - Intermittent chest x-ray & ABG PRN - Ensure adequate pulmonary hygiene  Patient needs TRACH to survive Family has consented TA- MODERATE Normal respiratory flora-no Staph aureus or Pseudomonas seen  Will repeat RESP CX's   SEVERE ANGIOEDEMA TRACH placed 10/15 STEROIDS completed AND H2 blockers    ACUTE KIDNEY INJURY/Renal Failure-improving -Foley Catheter discontinued, external catheter in place -Avoid nephrotoxic agents -Follow urine output, BMP -Ensure adequate renal perfusion, optimize oxygenation -Renal dose medications   Intake/Output Summary (Last 24 hours) at 02/09/2023 0806 Last data filed at 02/09/2023 5732 Gross per 24 hour  Intake 1161.01 ml  Output 550 ml  Net 611.01 ml      Latest Ref Rng & Units 02/09/2023    3:53 AM 02/08/2023    3:55 AM 02/07/2023    4:44 AM  BMP  Glucose 70 - 99 mg/dL 202  542  706   BUN 8 - 23 mg/dL 25  25  31    Creatinine 0.44 - 1.00 mg/dL 2.37  6.28  3.15   Sodium 135 - 145 mmol/L 143  142  139   Potassium 3.5 - 5.1 mmol/L 3.6  3.1  3.5   Chloride 98 - 111 mmol/L 111  110  109   CO2 22 - 32 mmol/L 24  25  23    Calcium 8.9 - 10.3 mg/dL 8.3  8.2  8.0      CARDIAC - ICU monitoring  SINUS BRADYCARDIA -resolved - BP remains stable - Continue tele monitoring   HYPERTENSION - Give Hydralazine PRN if systolic >150 or diastolic >100 - Continuous BP  monitoring   NEUROLOGY She remains comfortable Goal RASS 0 WUA started on 10/17 Wean precedex as tolerated Start Valium 2.5 TID scheduled Started OXY 5mg  every 6hrs Started Seroquel   ENDO - ICU hypoglycemic\Hyperglycemia protocol -check FSBS per protocol     GI GI PROPHYLAXIS as indicated   NUTRITIONAL STATUS DIET-->tube feeds, started 10/16 Dophoff in place, right nare Constipation protocol as indicated     ELECTROLYTES -follow labs as needed -replace as needed -pharmacy consultation and following   ACUTE ANEMIA TRANSFUSE AS NEEDED CONSIDER TRANSFUSION  IF HGB<7 - HGB trending down 9.4 (10/17) >>>> 8.9 (10/18) - Monitoring CBC QID         Best practice (right click and "Reselect all SmartList Selections" daily)  Diet: Tube feeds stared 02/07/23 Pain/Anxiety/Delirium protocol (if indicated): RASS goal 0 VAP protocol (if indicated): Yes DVT prophylaxis: LMWH GI prophylaxis: H2B Mobility:  bed rest  Code Status:  FULL Disposition:ICU  Labs   CBC: Recent Labs  Lab 02/05/23 0425 02/06/23 0431 02/07/23 0444 02/08/23 0355 02/09/23 0353  WBC 8.2 9.0 10.6* 11.1* 10.8*  HGB 8.6* 9.1* 9.8* 9.4* 8.9*  HCT 28.6* 29.1* 32.1* 30.7* 27.9*  MCV 93.8 91.5 91.7 90.8 88.9  PLT 200 220 292 274 250    Basic Metabolic Panel: Recent Labs  Lab 02/04/23 0331 02/05/23 0425 02/06/23 0431 02/07/23 0444 02/08/23 0355 02/09/23 0353  NA 140 143 140 139 142 143  K 3.7 3.9 3.7 3.5 3.1* 3.6  CL 108 108 109 109 110 111  CO2 23 24 24 23 25 24   GLUCOSE 153* 146* 139* 116* 158* 134*  BUN 35* 30* 29* 31* 25* 25*  CREATININE 1.13* 0.84 0.71 0.64 0.60 0.69  CALCIUM 8.3* 8.5* 8.1* 8.0* 8.2* 8.3*  MG 2.3 2.6*  --  2.6* 2.4 2.5*  PHOS 3.4 2.6 2.5 3.1 2.5 2.6       Past Medical History:  She,  has a past medical history of Angio-edema, Anxiety, Cataract, GERD (gastroesophageal reflux disease), Hyperlipidemia, Hypertension, Hypothyroidism, and Vitamin D deficiency.    Surgical History:   Past Surgical History:  Procedure Laterality Date   CATARACT EXTRACTION W/ INTRAOCULAR LENS IMPLANT Right 12/29/2016   Dr. Mia Creek   CATARACT EXTRACTION W/ INTRAOCULAR LENS IMPLANT Left 01/15/2017   Dr. Marcial Pacas Bevis   COLONOSCOPY     INTUBATION-ENDOTRACHEAL WITH TRACHEOSTOMY STANDBY N/A 02/01/2023   Procedure: INTUBATION-ENDOTRACHEAL WITH TRACHEOSTOMY STANDBY;  Surgeon: Linus Salmons, MD;  Location: [facility] ORS;  Service: ENT;  Laterality: N/A;   LEEP     TRACHEOSTOMY TUBE PLACEMENT N/A 02/06/2023   Procedure: TRACHEOSTOMY;  Surgeon: Bud Face, MD;  Location: [facility] ORS;  Service: ENT;  Laterality: N/A;   TUBAL LIGATION     VEIN LIGATION AND STRIPPING     x2     Social History:   reports that she quit smoking about 26 years ago. Her smoking use included cigarettes. She has never used smokeless tobacco. She reports current alcohol use of about 4.0 standard drinks of alcohol per week. She reports that she does not use drugs.   Family History:  Her family history includes Hypertension in her father and mother; Transient ischemic attack in her mother. There is no history of Colon cancer, Esophageal cancer, Rectal cancer, or Stomach cancer.   Allergies Allergies  Allergen Reactions   Cozaar [Losartan] Swelling   Septra [Sulfamethoxazole-Trimethoprim] Anaphylaxis   Zinc Gelatin [Zinc] Anaphylaxis   Alpha-Gal    Amlodipine Besylate     REACTION: edema   Atorvastatin     REACTION: muscle pain   Beef Allergy    Cat Hair Extract    Dust Mite Extract    Neomycin-Bacitracin Zn-Polymyx     REACTION: rash Per pt not sure if from meds or not       DVT/GI PRX  assessed I Assessed the need for Labs I Assessed the need for Foley I Assessed the need for Central Venous Line Family Discussion when available I Assessed the need for Mobilization I made an Assessment of medications to be adjusted accordingly Safety Risk assessment completed  CASE  DISCUSSED IN MULTIDISCIPLINARY ROUNDS WITH ICU TEAM     Critical Care Time devoted to patient care services described in this note is 55 minutes.   Critical care was necessary to treat /prevent imminent and life-threatening deterioration.   Patient is critically ill. Patient with Multiorgan failure and at high risk for cardiac arrest and death.    Lucie Leather, M.D.  [facility]  Medical Director [facility] Medical Director [facility] Cardio-Pulmonary Department

## 2023-02-09 NOTE — TOC Transition Note (Addendum)
Transition of Care [facility]) - CM/SW Discharge Note   Patient Details  Name: Sharon [name] MRN: [id number] Date of Birth: July 19, 1950  [facility] CM/SW Contact:  Liliana Cline, LCSW Phone Number: 02/09/2023, 5:24 PM   Clinical Narrative:    Patient to DC to [facility] today. Room ICU 415. Sarah with [facility] is aware.  [facility] CSW Maralyn Sago has updated patient's spouse. MD to call report. Per MD, EMTALA has been completed. RN to arrange [facility] transport. Asked [facility] RN to call Maralyn Sago with [facility] with ETA when available per [facility] request.    Final next level of care: Long Term Acute Care (LTAC) Barriers to Discharge: Barriers Resolved   Patient Goals and CMS Choice CMS Medicare.gov Compare Post Acute Care list provided to:: Patient Represenative (must comment) (Husband)    Discharge Placement                  Patient to be transferred to facility by: [facility] Name of family member notified: spouse updated by CSW Sarah Patient and family notified of of transfer: 02/09/23  Discharge Plan and Services Additional resources added to the After Visit Summary for       Post Acute Care Choice: Long Term Acute Care (LTAC)                               Social Determinants of Health (SDOH) Interventions SDOH Screenings   Food Insecurity: Unknown (02/07/2023)  Housing: Low Risk  (02/07/2023)  Transportation Needs: No Transportation Needs (02/07/2023)  Utilities: Not At Risk (02/07/2023)  Alcohol Screen: Low Risk  (02/21/2022)  Depression (PHQ2-9): Low Risk  (06/09/2022)  Financial Resource Strain: Low Risk  (02/21/2022)  Physical Activity: Insufficiently Active (02/21/2022)  Social Connections: Socially Isolated (02/21/2022)  Stress: No Stress Concern Present (02/21/2022)  Tobacco Use: Medium Risk (02/01/2023)     Readmission Risk Interventions     No data to display

## 2023-02-09 NOTE — Plan of Care (Signed)
[facility] Tonsillectomy/Adenoidectomy, Postoperative PEDS care plan entered in error.

## 2023-02-09 NOTE — Discharge Summary (Signed)
Physician Discharge Summary  Patient ID: Sharon [name] MRN: [id number] DOB/AGE: November 25, 1950 72 y.o.  Admit date: 02/01/2023 Discharge date: 02/09/2023  Admission Diagnoses:Angioedema        History of Present Illness:   72 year old female with a history of recurrent angioedema, husband says she has been to the hospital least 14 times in the last year for angioedema.  She was intubated one other time before this.  She is not on an ACE inhibitor and not on an ARB drug.   ENT called to bedside Patient in obvious distress no stridor noted however she has significant angioedema of the floor mouth and tongue with the tongue protruding from the mouth.  She is unable to phonate without significant difficulty.  External ears appear normal with anterior nose benign oral cavity as above.  Patient was decongested with Afrin spray.  A flexible fiberoptic laryngoscope was introduced through the right nostril.  Examination of the posterior pharynx showed some uvular edema.  The tongue base that there is significant swelling was also significant edema of the epiglottis the bilateral arytenoids.  The vocal folds were visualized.  The subglottis and trachea appeared normal.   Patient was taken to OR for emergent nasal intubation   Case Discussed with ENT [facility] was placed 02/06/23 SAT successful yesterday 02/07/23 The patient remains on the ventilator via trach Possibility of LTAC discussed with family 10/16 Weaning precedex in anticipation for transition to [facility] 10/18     Wound Check No wounds or pressure ulcers noted upon admission to ICU   Pertinent  Medical History  Previous history of ANGIOEDEMA   Significant Hospital Events: Including procedures, antibiotic start and stop dates in addition to other pertinent events   10/10 admitted for severe resp failure for severe angioedema 10/11: remains intubated  10/12 remains intubated and sedated 10/13 remains nasally intubated and  sedated 10/14 proceed with [facility] 10/15 TRACH planned for today with ENT surgery 10/16 SAT/SBT planned, fentanyl and propofol d/c PRECEDEX started for agitation 10/17 Patient awake and following commands, remains on VENT 10/18 Patient is more alert today and follows commands, remains on VENT Weaning off Precedex         Antimicrobials:    Antibiotics Given (last 72 hours)       None                   Interim History / Subjective:  + Agitation +Right sided UE weakness Patient remains critically ill   Plan for CT head     Objective   Blood pressure (!) 151/66, pulse 63, temperature 99.7 F (37.6 C), temperature source Oral, resp. rate 16, height 5' 6.5" (1.689 m), weight 53.7 kg, SpO2 99%.     >  Vent Mode: PSV FiO2 (%):  [28 %] 28 % PEEP:  [5 cmH20] 5 cmH20 Pressure Support:  [5 cmH20] 5 cmH20      Intake/Output Summary (Last 24 hours) at 02/09/2023 0806 Last data filed at 02/09/2023 2841    Gross per 24 hour  Intake 1161.01 ml  Output 550 ml  Net 611.01 ml        Filed Weights    02/01/23 1222 02/09/23 0313  Weight: 97.5 kg 53.7 kg        REVIEW OF SYSTEMS   PATIENT IS UNABLE TO PROVIDE COMPLETE REVIEW OF SYSTEMS DUE TO SEVERE CRITICAL ILLNESS    PHYSICAL EXAMINATION:   GENERAL:critically ill appearing, +resp distress EYES: Pupils equal, round, reactive to light.  No  scleral icterus.  MOUTH: Dobhoff in place lips/tongue swelling improved, mildly edematous  NECK: Supple. Size 6 cuffed Shiley placed between tracheal rings 2 and 3.  PULMONARY: Lungs clear to auscultation, +rhonchi, +wheezing CARDIOVASCULAR: S1 and S2.  Regular rate and rhythm GASTROINTESTINAL: Soft, nontender, -distended. Positive bowel sounds.  MUSCULOSKELETAL: No swelling, clubbing, or edema.  NEUROLOGIC: sedated, follows commands, REU weakness 3/5 strength SKIN:normal, warm to touch, Capillary refill delayed  Pulses present bilaterally GU: external catheter in place   EXTREMITIES: PICC line in place LUE, PIV in RUE both dressings are clean dry and intact    No wounds or pressure ulcers noted upon admission to ICU      ASSESSMENT AND PLAN SYNOPSIS   71 yo white female with multiple episodes of angioedema with acute laryngeal edema due to  angioedema leading to severe resp failure ANGIOEDEMA WORK UP HAS BEEN NEGATIVE    Severe ACUTE Hypoxic and Hypercapnic Respiratory Failure -continue Mechanical Ventilator support -Wean Fio2 and PEEP as tolerated -VAP/VENT bundle implementation - Wean PEEP & FiO2 as tolerated, maintain SpO2 > 88% - Head of bed elevated 30 degrees, VAP protocol in place - Plateau pressures less than 30 cm H20  - Intermittent chest x-ray & ABG PRN - Ensure adequate pulmonary hygiene  Patient needs TRACH to survive Family has consented TA- MODERATE Normal respiratory flora-no Staph aureus or Pseudomonas seen  Will repeat RESP CX's     SEVERE ANGIOEDEMA TRACH placed 10/15 STEROIDS completed AND H2 blockers      ACUTE KIDNEY INJURY/Renal Failure-improving -Foley Catheter discontinued, external catheter in place -Avoid nephrotoxic agents -Follow urine output, BMP -Ensure adequate renal perfusion, optimize oxygenation -Renal dose medications     Intake/Output Summary (Last 24 hours) at 02/09/2023 0806 Last data filed at 02/09/2023 8295    Gross per 24 hour  Intake 1161.01 ml  Output 550 ml  Net 611.01 ml        Latest Ref Rng & Units 02/09/2023    3:53 AM 02/08/2023    3:55 AM 02/07/2023    4:44 AM  BMP  Glucose 70 - 99 mg/dL 621  308  657   BUN 8 - 23 mg/dL 25  25  31    Creatinine 0.44 - 1.00 mg/dL 8.46  9.62  9.52   Sodium 135 - 145 mmol/L 143  142  139   Potassium 3.5 - 5.1 mmol/L 3.6  3.1  3.5   Chloride 98 - 111 mmol/L 111  110  109   CO2 22 - 32 mmol/L 24  25  23    Calcium 8.9 - 10.3 mg/dL 8.3  8.2  8.0         CARDIAC - ICU monitoring   SINUS BRADYCARDIA -resolved - BP remains stable - Continue  tele monitoring    HYPERTENSION - Give Hydralazine PRN if systolic >150 or diastolic >100 - Continuous BP monitoring     NEUROLOGY She remains comfortable Goal RASS 0 WUA started on 10/17 Wean precedex as tolerated Start Valium 2.5 TID scheduled Started OXY 5mg  every 6hrs Started Seroquel   ENDO - ICU hypoglycemic\Hyperglycemia protocol -check FSBS per protocol     GI GI PROPHYLAXIS as indicated   NUTRITIONAL STATUS DIET-->tube feeds, started 10/16 Dophoff in place, right nare Constipation protocol as indicated     ELECTROLYTES -follow labs as needed -replace as needed -pharmacy consultation and following   ACUTE ANEMIA TRANSFUSE AS NEEDED CONSIDER TRANSFUSION  IF HGB<7 - HGB trending down 9.4 (10/17) >>>> 8.9 (  10/18) - Monitoring CBC QID        Vent weaning Attempts   10/15 patient obtained [facility]   10/16 Patient has failed VENT weaning attempts, increased PS support needed Patient with muscle weakness and resp muscle fatigue   10/17 patient has failed Vent weaning attempt, patient with increased WOB   10/18 patient has failed vent weaning attempts to TCT   Precedex started for agitation     Patient is a very good candidate for LTACH   Recommend LTACH referral and placement             Best practice (right click and "Reselect all SmartList Selections" daily)  Diet: Tube feeds stared 02/07/23 Pain/Anxiety/Delirium protocol (if indicated): RASS goal 0 VAP protocol (if indicated): Yes DVT prophylaxis: LMWH GI prophylaxis: H2B Mobility:  bed rest  Code Status:  FULL Disposition:ICU   Discharge Diagnoses:  Principal Problem:   Angioedema  Discharge Exam: Blood pressure (!) 127/102, pulse (!) 53, temperature 98.7 F (37.1 C), resp. rate 11, height 5' 6.5" (1.689 m), weight 53.7 kg, SpO2 94%.  Disposition: LTACH   Allergies as of 02/09/2023       Reactions   Cozaar [losartan] Swelling   Septra [sulfamethoxazole-trimethoprim]  Anaphylaxis   Zinc Gelatin [zinc] Anaphylaxis   Alpha-gal    Amlodipine Besylate    REACTION: edema   Atorvastatin    REACTION: muscle pain   Beef Allergy    Cat Hair Extract    Dust Mite Extract    Neomycin-bacitracin Zn-polymyx    REACTION: rash Per pt not sure if from meds or not        Medication List     STOP taking these medications    albuterol 108 (90 Base) MCG/ACT inhaler Commonly known as: VENTOLIN HFA   aspirin 81 MG tablet   benzonatate 200 MG capsule Commonly known as: TESSALON   calcium carbonate 750 MG chewable tablet Commonly known as: TUMS EX   cyanocobalamin 1000 MCG tablet Commonly known as: VITAMIN B12   diphenhydrAMINE 25 MG tablet Commonly known as: Benadryl Allergy   EPINEPHrine 0.3 mg/0.3 mL Soaj injection Commonly known as: EPI-PEN   ezetimibe 10 MG tablet Commonly known as: ZETIA   fexofenadine 180 MG tablet Commonly known as: ALLEGRA   fluticasone 50 MCG/ACT nasal spray Commonly known as: FLONASE   hydrochlorothiazide 25 MG tablet Commonly known as: HYDRODIURIL   levocetirizine 5 MG tablet Commonly known as: XYZAL   levothyroxine 50 MCG tablet Commonly known as: SYNTHROID   metoprolol tartrate 25 MG tablet Commonly known as: LOPRESSOR   oxybutynin 10 MG 24 hr tablet Commonly known as: DITROPAN-XL   pantoprazole 40 MG tablet Commonly known as: PROTONIX   rosuvastatin 20 MG tablet Commonly known as: CRESTOR   sertraline 100 MG tablet Commonly known as: ZOLOFT   Vitamin D 1000 units capsule       TAKE these medications    bisacodyl 10 MG suppository Commonly known as: DULCOLAX Place 1 suppository (10 mg total) rectally daily as needed for moderate constipation.   dexmedetomidine 400 MCG/100ML Soln Commonly known as: PRECEDEX Inject 39-117 mcg/hr into the vein continuous.   diazepam 5 MG tablet Commonly known as: VALIUM Place 0.5 tablets (2.5 mg total) into feeding tube every 8 (eight) hours.    docusate 50 MG/5ML liquid Commonly known as: COLACE Place 10 mLs (100 mg total) into feeding tube 2 (two) times daily.   famotidine 20 MG tablet Commonly known as: PEPCID Place 1 tablet (  20 mg total) into feeding tube daily. Start taking on: February 10, 2023   fondaparinux 2.5 MG/0.5ML Soln injection Commonly known as: ARIXTRA Inject 0.5 mLs (2.5 mg total) into the skin daily.   hydrALAZINE 20 MG/ML injection Commonly known as: APRESOLINE Inject 0.5-1 mLs (10-20 mg total) into the vein every 6 (six) hours as needed (give for SBP >170).   ipratropium-albuterol 0.5-2.5 (3) MG/3ML Soln Commonly known as: DUONEB Take 3 mLs by nebulization every 4 (four) hours as needed.   ondansetron 4 MG/2ML Soln injection Commonly known as: ZOFRAN Inject 2 mLs (4 mg total) into the vein every 6 (six) hours as needed for nausea.   oxyCODONE 5 MG immediate release tablet Commonly known as: Oxy IR/ROXICODONE Place 1 tablet (5 mg total) into feeding tube every 6 (six) hours.   QUEtiapine 25 MG tablet Commonly known as: SEROQUEL Place 1 tablet (25 mg total) into feeding tube 2 (two) times daily.         Signed: Erin Fulling 02/09/2023, 5:10 PM

## 2023-02-09 NOTE — TOC Progression Note (Addendum)
Transition of Care [facility]) - Progression Note    Patient Details  Name: Sharon [name] MRN: [id number] Date of Birth: [birth year]  Transition of Care [facility]) CM/SW Contact  Margarito Liner, LCSW Phone Number: 02/09/2023, 3:45 PM  Clinical Narrative:  Insurance is offering a peer-to-peer review for the [facility] authorization. Sent details to MD. Deadline is Monday 10/21 at 10:30 am.   3:56 pm: Per MD, peer-to-peer is complete and insurance will approve her. Will await authorization details.  4:46 pm: Auth approved. [facility] can accept her today. Husband and MD are aware.  Expected Discharge Plan: Long Term Acute Care (LTAC) Barriers to Discharge: Continued Medical Work up  Expected Discharge Plan and Services     Post Acute Care Choice: Long Term Acute Care (LTAC) Living arrangements for the past 2 months: Single Family Home                                       Social Determinants of Health (SDOH) Interventions SDOH Screenings   Food Insecurity: Unknown (02/07/2023)  Housing: Low Risk  (02/07/2023)  Transportation Needs: No Transportation Needs (02/07/2023)  Utilities: Not At Risk (02/07/2023)  Alcohol Screen: Low Risk  (02/21/2022)  Depression (PHQ2-9): Low Risk  (06/09/2022)  Financial Resource Strain: Low Risk  (02/21/2022)  Physical Activity: Insufficiently Active (02/21/2022)  Social Connections: Socially Isolated (02/21/2022)  Stress: No Stress Concern Present (02/21/2022)  Tobacco Use: Medium Risk (02/01/2023)    Readmission Risk Interventions     No data to display

## 2023-02-09 NOTE — Plan of Care (Signed)
Vent weaning Attempts  10/15 patient obtained [facility]  10/16 Patient has failed VENT weaning attempts, increased PS support needed Patient with muscle weakness and resp muscle fatigue  10/17 patient has failed Vent weaning attempt, patient with increased WOB  10/18 patient has failed vent weaning attempts to TCT  Precedex started for agitation   Patient is a very good candidate for LTACH  Recommend LTACH referral and placement     Maybree Riling Santiago Glad, M.D.  [facility]  Medical Director [facility] Medical Director [facility] Cardio-Pulmonary Department

## 2023-02-10 DIAGNOSIS — R1312 Dysphagia, oropharyngeal phase: Secondary | ICD-10-CM

## 2023-02-10 DIAGNOSIS — F419 Anxiety disorder, unspecified: Secondary | ICD-10-CM

## 2023-02-10 DIAGNOSIS — R0989 Other specified symptoms and signs involving the circulatory and respiratory systems: Secondary | ICD-10-CM | POA: Diagnosis not present

## 2023-02-10 DIAGNOSIS — J9601 Acute respiratory failure with hypoxia: Secondary | ICD-10-CM

## 2023-02-10 DIAGNOSIS — J9621 Acute and chronic respiratory failure with hypoxia: Secondary | ICD-10-CM

## 2023-02-10 DIAGNOSIS — Z930 Tracheostomy status: Secondary | ICD-10-CM

## 2023-02-10 DIAGNOSIS — T783XXA Angioneurotic edema, initial encounter: Secondary | ICD-10-CM

## 2023-02-10 DIAGNOSIS — I10 Essential (primary) hypertension: Secondary | ICD-10-CM

## 2023-02-10 DIAGNOSIS — T783XXD Angioneurotic edema, subsequent encounter: Secondary | ICD-10-CM

## 2023-02-11 DIAGNOSIS — Z930 Tracheostomy status: Secondary | ICD-10-CM

## 2023-02-11 DIAGNOSIS — F419 Anxiety disorder, unspecified: Secondary | ICD-10-CM

## 2023-02-11 DIAGNOSIS — T783XXA Angioneurotic edema, initial encounter: Secondary | ICD-10-CM

## 2023-02-11 DIAGNOSIS — J9621 Acute and chronic respiratory failure with hypoxia: Secondary | ICD-10-CM

## 2023-02-12 DIAGNOSIS — J9621 Acute and chronic respiratory failure with hypoxia: Secondary | ICD-10-CM

## 2023-02-12 DIAGNOSIS — T783XXS Angioneurotic edema, sequela: Secondary | ICD-10-CM

## 2023-02-12 DIAGNOSIS — T783XXA Angioneurotic edema, initial encounter: Secondary | ICD-10-CM

## 2023-02-12 DIAGNOSIS — F419 Anxiety disorder, unspecified: Secondary | ICD-10-CM

## 2023-02-12 DIAGNOSIS — I10 Essential (primary) hypertension: Secondary | ICD-10-CM

## 2023-02-12 DIAGNOSIS — R1312 Dysphagia, oropharyngeal phase: Secondary | ICD-10-CM

## 2023-02-12 DIAGNOSIS — J9601 Acute respiratory failure with hypoxia: Secondary | ICD-10-CM

## 2023-02-12 DIAGNOSIS — Z930 Tracheostomy status: Secondary | ICD-10-CM

## 2023-02-13 DIAGNOSIS — Z9911 Dependence on respirator [ventilator] status: Secondary | ICD-10-CM | POA: Diagnosis not present

## 2023-02-15 DIAGNOSIS — F419 Anxiety disorder, unspecified: Secondary | ICD-10-CM

## 2023-02-15 DIAGNOSIS — J9621 Acute and chronic respiratory failure with hypoxia: Secondary | ICD-10-CM

## 2023-02-15 DIAGNOSIS — T783XXA Angioneurotic edema, initial encounter: Secondary | ICD-10-CM

## 2023-02-15 DIAGNOSIS — Z930 Tracheostomy status: Secondary | ICD-10-CM

## 2023-02-16 DIAGNOSIS — F419 Anxiety disorder, unspecified: Secondary | ICD-10-CM

## 2023-02-16 DIAGNOSIS — Z930 Tracheostomy status: Secondary | ICD-10-CM

## 2023-02-16 DIAGNOSIS — R0989 Other specified symptoms and signs involving the circulatory and respiratory systems: Secondary | ICD-10-CM | POA: Diagnosis not present

## 2023-02-16 DIAGNOSIS — T783XXA Angioneurotic edema, initial encounter: Secondary | ICD-10-CM

## 2023-02-16 DIAGNOSIS — J9621 Acute and chronic respiratory failure with hypoxia: Secondary | ICD-10-CM

## 2023-02-17 DIAGNOSIS — F419 Anxiety disorder, unspecified: Secondary | ICD-10-CM

## 2023-02-17 DIAGNOSIS — Z930 Tracheostomy status: Secondary | ICD-10-CM

## 2023-02-17 DIAGNOSIS — T783XXA Angioneurotic edema, initial encounter: Secondary | ICD-10-CM

## 2023-02-17 DIAGNOSIS — J9621 Acute and chronic respiratory failure with hypoxia: Secondary | ICD-10-CM

## 2023-02-18 DIAGNOSIS — F419 Anxiety disorder, unspecified: Secondary | ICD-10-CM

## 2023-02-18 DIAGNOSIS — T783XXA Angioneurotic edema, initial encounter: Secondary | ICD-10-CM

## 2023-02-18 DIAGNOSIS — J9621 Acute and chronic respiratory failure with hypoxia: Secondary | ICD-10-CM

## 2023-02-18 DIAGNOSIS — Z930 Tracheostomy status: Secondary | ICD-10-CM

## 2023-02-19 DIAGNOSIS — J9601 Acute respiratory failure with hypoxia: Secondary | ICD-10-CM

## 2023-02-19 DIAGNOSIS — Z930 Tracheostomy status: Secondary | ICD-10-CM

## 2023-02-19 DIAGNOSIS — F419 Anxiety disorder, unspecified: Secondary | ICD-10-CM

## 2023-02-19 DIAGNOSIS — T783XXA Angioneurotic edema, initial encounter: Secondary | ICD-10-CM

## 2023-02-19 DIAGNOSIS — R1312 Dysphagia, oropharyngeal phase: Secondary | ICD-10-CM

## 2023-02-19 DIAGNOSIS — I10 Essential (primary) hypertension: Secondary | ICD-10-CM

## 2023-02-19 DIAGNOSIS — J9621 Acute and chronic respiratory failure with hypoxia: Secondary | ICD-10-CM

## 2023-02-19 DIAGNOSIS — T783XXD Angioneurotic edema, subsequent encounter: Secondary | ICD-10-CM

## 2023-02-20 DIAGNOSIS — Z930 Tracheostomy status: Secondary | ICD-10-CM

## 2023-02-20 DIAGNOSIS — T783XXA Angioneurotic edema, initial encounter: Secondary | ICD-10-CM

## 2023-02-20 DIAGNOSIS — F419 Anxiety disorder, unspecified: Secondary | ICD-10-CM

## 2023-02-20 DIAGNOSIS — J9621 Acute and chronic respiratory failure with hypoxia: Secondary | ICD-10-CM

## 2023-02-21 DIAGNOSIS — Z930 Tracheostomy status: Secondary | ICD-10-CM

## 2023-02-21 DIAGNOSIS — F419 Anxiety disorder, unspecified: Secondary | ICD-10-CM

## 2023-02-21 DIAGNOSIS — J9621 Acute and chronic respiratory failure with hypoxia: Secondary | ICD-10-CM

## 2023-02-21 DIAGNOSIS — T783XXA Angioneurotic edema, initial encounter: Secondary | ICD-10-CM

## 2023-02-22 DIAGNOSIS — J9621 Acute and chronic respiratory failure with hypoxia: Secondary | ICD-10-CM

## 2023-02-22 DIAGNOSIS — F419 Anxiety disorder, unspecified: Secondary | ICD-10-CM

## 2023-02-22 DIAGNOSIS — T783XXA Angioneurotic edema, initial encounter: Secondary | ICD-10-CM

## 2023-02-22 DIAGNOSIS — Z930 Tracheostomy status: Secondary | ICD-10-CM

## 2023-02-23 DIAGNOSIS — F419 Anxiety disorder, unspecified: Secondary | ICD-10-CM

## 2023-02-23 DIAGNOSIS — J9621 Acute and chronic respiratory failure with hypoxia: Secondary | ICD-10-CM

## 2023-02-23 DIAGNOSIS — T783XXA Angioneurotic edema, initial encounter: Secondary | ICD-10-CM

## 2023-02-23 DIAGNOSIS — Z930 Tracheostomy status: Secondary | ICD-10-CM

## 2023-02-24 DIAGNOSIS — T783XXD Angioneurotic edema, subsequent encounter: Secondary | ICD-10-CM

## 2023-02-24 DIAGNOSIS — Z930 Tracheostomy status: Secondary | ICD-10-CM

## 2023-02-24 DIAGNOSIS — F419 Anxiety disorder, unspecified: Secondary | ICD-10-CM

## 2023-02-24 DIAGNOSIS — J9621 Acute and chronic respiratory failure with hypoxia: Secondary | ICD-10-CM

## 2023-02-24 DIAGNOSIS — J9601 Acute respiratory failure with hypoxia: Secondary | ICD-10-CM

## 2023-02-24 DIAGNOSIS — R1312 Dysphagia, oropharyngeal phase: Secondary | ICD-10-CM

## 2023-02-24 DIAGNOSIS — I10 Essential (primary) hypertension: Secondary | ICD-10-CM

## 2023-02-24 DIAGNOSIS — T783XXA Angioneurotic edema, initial encounter: Secondary | ICD-10-CM

## 2023-02-25 DIAGNOSIS — T783XXA Angioneurotic edema, initial encounter: Secondary | ICD-10-CM

## 2023-02-25 DIAGNOSIS — J9621 Acute and chronic respiratory failure with hypoxia: Secondary | ICD-10-CM

## 2023-02-25 DIAGNOSIS — Z930 Tracheostomy status: Secondary | ICD-10-CM

## 2023-02-25 DIAGNOSIS — F419 Anxiety disorder, unspecified: Secondary | ICD-10-CM

## 2023-02-26 DIAGNOSIS — Z930 Tracheostomy status: Secondary | ICD-10-CM

## 2023-02-26 DIAGNOSIS — J9621 Acute and chronic respiratory failure with hypoxia: Secondary | ICD-10-CM

## 2023-02-26 DIAGNOSIS — I10 Essential (primary) hypertension: Secondary | ICD-10-CM

## 2023-02-26 DIAGNOSIS — F419 Anxiety disorder, unspecified: Secondary | ICD-10-CM

## 2023-02-26 DIAGNOSIS — J9601 Acute respiratory failure with hypoxia: Secondary | ICD-10-CM

## 2023-02-26 DIAGNOSIS — R1312 Dysphagia, oropharyngeal phase: Secondary | ICD-10-CM

## 2023-02-26 DIAGNOSIS — T783XXA Angioneurotic edema, initial encounter: Secondary | ICD-10-CM

## 2023-02-26 DIAGNOSIS — T783XXD Angioneurotic edema, subsequent encounter: Secondary | ICD-10-CM

## 2023-02-27 DIAGNOSIS — T783XXA Angioneurotic edema, initial encounter: Secondary | ICD-10-CM

## 2023-02-27 DIAGNOSIS — Z930 Tracheostomy status: Secondary | ICD-10-CM

## 2023-02-27 DIAGNOSIS — F419 Anxiety disorder, unspecified: Secondary | ICD-10-CM

## 2023-02-27 DIAGNOSIS — J9621 Acute and chronic respiratory failure with hypoxia: Secondary | ICD-10-CM

## 2023-02-28 DIAGNOSIS — T783XXA Angioneurotic edema, initial encounter: Secondary | ICD-10-CM

## 2023-02-28 DIAGNOSIS — F419 Anxiety disorder, unspecified: Secondary | ICD-10-CM

## 2023-02-28 DIAGNOSIS — Z930 Tracheostomy status: Secondary | ICD-10-CM

## 2023-02-28 DIAGNOSIS — J9621 Acute and chronic respiratory failure with hypoxia: Secondary | ICD-10-CM

## 2023-03-01 DIAGNOSIS — Z930 Tracheostomy status: Secondary | ICD-10-CM

## 2023-03-01 DIAGNOSIS — J9621 Acute and chronic respiratory failure with hypoxia: Secondary | ICD-10-CM

## 2023-03-01 DIAGNOSIS — T783XXA Angioneurotic edema, initial encounter: Secondary | ICD-10-CM

## 2023-03-01 DIAGNOSIS — F419 Anxiety disorder, unspecified: Secondary | ICD-10-CM

## 2023-03-05 DIAGNOSIS — F419 Anxiety disorder, unspecified: Secondary | ICD-10-CM

## 2023-03-05 DIAGNOSIS — I10 Essential (primary) hypertension: Secondary | ICD-10-CM

## 2023-03-05 DIAGNOSIS — T783XXD Angioneurotic edema, subsequent encounter: Secondary | ICD-10-CM

## 2023-03-05 DIAGNOSIS — J9601 Acute respiratory failure with hypoxia: Secondary | ICD-10-CM

## 2023-03-05 DIAGNOSIS — Z930 Tracheostomy status: Secondary | ICD-10-CM

## 2023-03-05 DIAGNOSIS — R1312 Dysphagia, oropharyngeal phase: Secondary | ICD-10-CM

## 2023-03-05 DIAGNOSIS — J9621 Acute and chronic respiratory failure with hypoxia: Secondary | ICD-10-CM

## 2023-03-05 DIAGNOSIS — T783XXA Angioneurotic edema, initial encounter: Secondary | ICD-10-CM

## 2023-03-06 DIAGNOSIS — T783XXA Angioneurotic edema, initial encounter: Secondary | ICD-10-CM

## 2023-03-06 DIAGNOSIS — J9621 Acute and chronic respiratory failure with hypoxia: Secondary | ICD-10-CM

## 2023-03-06 DIAGNOSIS — Z930 Tracheostomy status: Secondary | ICD-10-CM

## 2023-03-06 DIAGNOSIS — F419 Anxiety disorder, unspecified: Secondary | ICD-10-CM

## 2023-03-07 DIAGNOSIS — T783XXA Angioneurotic edema, initial encounter: Secondary | ICD-10-CM

## 2023-03-07 DIAGNOSIS — J9621 Acute and chronic respiratory failure with hypoxia: Secondary | ICD-10-CM

## 2023-03-07 DIAGNOSIS — Z930 Tracheostomy status: Secondary | ICD-10-CM

## 2023-03-07 DIAGNOSIS — F419 Anxiety disorder, unspecified: Secondary | ICD-10-CM

## 2023-03-08 DIAGNOSIS — Z930 Tracheostomy status: Secondary | ICD-10-CM

## 2023-03-08 DIAGNOSIS — F419 Anxiety disorder, unspecified: Secondary | ICD-10-CM

## 2023-03-08 DIAGNOSIS — J9621 Acute and chronic respiratory failure with hypoxia: Secondary | ICD-10-CM

## 2023-03-08 DIAGNOSIS — T783XXA Angioneurotic edema, initial encounter: Secondary | ICD-10-CM

## 2023-03-09 DIAGNOSIS — Z930 Tracheostomy status: Secondary | ICD-10-CM

## 2023-03-09 DIAGNOSIS — F419 Anxiety disorder, unspecified: Secondary | ICD-10-CM

## 2023-03-09 DIAGNOSIS — J9621 Acute and chronic respiratory failure with hypoxia: Secondary | ICD-10-CM

## 2023-03-09 DIAGNOSIS — T783XXA Angioneurotic edema, initial encounter: Secondary | ICD-10-CM

## 2023-03-10 DIAGNOSIS — T783XXD Angioneurotic edema, subsequent encounter: Secondary | ICD-10-CM

## 2023-03-10 DIAGNOSIS — R1312 Dysphagia, oropharyngeal phase: Secondary | ICD-10-CM

## 2023-03-10 DIAGNOSIS — J9621 Acute and chronic respiratory failure with hypoxia: Secondary | ICD-10-CM

## 2023-03-10 DIAGNOSIS — F419 Anxiety disorder, unspecified: Secondary | ICD-10-CM

## 2023-03-10 DIAGNOSIS — I10 Essential (primary) hypertension: Secondary | ICD-10-CM

## 2023-03-10 DIAGNOSIS — T783XXA Angioneurotic edema, initial encounter: Secondary | ICD-10-CM

## 2023-03-10 DIAGNOSIS — J9601 Acute respiratory failure with hypoxia: Secondary | ICD-10-CM

## 2023-03-10 DIAGNOSIS — Z930 Tracheostomy status: Secondary | ICD-10-CM

## 2023-03-11 DIAGNOSIS — F419 Anxiety disorder, unspecified: Secondary | ICD-10-CM

## 2023-03-11 DIAGNOSIS — T783XXA Angioneurotic edema, initial encounter: Secondary | ICD-10-CM

## 2023-03-11 DIAGNOSIS — J9621 Acute and chronic respiratory failure with hypoxia: Secondary | ICD-10-CM

## 2023-03-11 DIAGNOSIS — Z930 Tracheostomy status: Secondary | ICD-10-CM

## 2023-03-12 DIAGNOSIS — I10 Essential (primary) hypertension: Secondary | ICD-10-CM

## 2023-03-12 DIAGNOSIS — T783XXD Angioneurotic edema, subsequent encounter: Secondary | ICD-10-CM

## 2023-03-12 DIAGNOSIS — J9601 Acute respiratory failure with hypoxia: Secondary | ICD-10-CM

## 2023-03-12 DIAGNOSIS — R1312 Dysphagia, oropharyngeal phase: Secondary | ICD-10-CM

## 2023-03-19 DIAGNOSIS — Z79899 Other long term (current) drug therapy: Secondary | ICD-10-CM | POA: Diagnosis not present

## 2023-03-19 DIAGNOSIS — Z930 Tracheostomy status: Secondary | ICD-10-CM | POA: Diagnosis not present

## 2023-03-19 DIAGNOSIS — R1310 Dysphagia, unspecified: Secondary | ICD-10-CM | POA: Diagnosis not present

## 2023-03-19 DIAGNOSIS — Z9289 Personal history of other medical treatment: Secondary | ICD-10-CM | POA: Diagnosis not present

## 2023-03-19 DIAGNOSIS — J9610 Chronic respiratory failure, unspecified whether with hypoxia or hypercapnia: Secondary | ICD-10-CM | POA: Diagnosis not present

## 2023-03-19 DIAGNOSIS — Z7409 Other reduced mobility: Secondary | ICD-10-CM | POA: Diagnosis not present

## 2023-03-19 DIAGNOSIS — T783XXS Angioneurotic edema, sequela: Secondary | ICD-10-CM | POA: Diagnosis not present

## 2023-03-19 DIAGNOSIS — Z741 Need for assistance with personal care: Secondary | ICD-10-CM | POA: Diagnosis not present

## 2023-03-19 DIAGNOSIS — R410 Disorientation, unspecified: Secondary | ICD-10-CM | POA: Diagnosis not present

## 2023-03-19 DIAGNOSIS — K219 Gastro-esophageal reflux disease without esophagitis: Secondary | ICD-10-CM | POA: Diagnosis not present

## 2023-03-19 DIAGNOSIS — J9503 Malfunction of tracheostomy stoma: Secondary | ICD-10-CM | POA: Diagnosis not present

## 2023-03-19 DIAGNOSIS — J9500 Unspecified tracheostomy complication: Secondary | ICD-10-CM | POA: Diagnosis not present

## 2023-03-19 DIAGNOSIS — G8929 Other chronic pain: Secondary | ICD-10-CM | POA: Diagnosis not present

## 2023-03-19 DIAGNOSIS — Z91014 Allergy to mammalian meats: Secondary | ICD-10-CM | POA: Diagnosis not present

## 2023-03-19 DIAGNOSIS — Z743 Need for continuous supervision: Secondary | ICD-10-CM | POA: Diagnosis not present

## 2023-03-19 DIAGNOSIS — K5900 Constipation, unspecified: Secondary | ICD-10-CM | POA: Diagnosis not present

## 2023-03-19 DIAGNOSIS — M6281 Muscle weakness (generalized): Secondary | ICD-10-CM | POA: Diagnosis not present

## 2023-03-19 DIAGNOSIS — R6889 Other general symptoms and signs: Secondary | ICD-10-CM | POA: Diagnosis not present

## 2023-03-19 DIAGNOSIS — R262 Difficulty in walking, not elsewhere classified: Secondary | ICD-10-CM | POA: Diagnosis not present

## 2023-03-19 DIAGNOSIS — I1310 Hypertensive heart and chronic kidney disease without heart failure, with stage 1 through stage 4 chronic kidney disease, or unspecified chronic kidney disease: Secondary | ICD-10-CM | POA: Diagnosis not present

## 2023-03-19 DIAGNOSIS — Z7401 Bed confinement status: Secondary | ICD-10-CM | POA: Diagnosis not present

## 2023-03-19 DIAGNOSIS — Z931 Gastrostomy status: Secondary | ICD-10-CM | POA: Diagnosis not present

## 2023-03-19 DIAGNOSIS — J9621 Acute and chronic respiratory failure with hypoxia: Secondary | ICD-10-CM | POA: Diagnosis not present

## 2023-03-19 DIAGNOSIS — J9601 Acute respiratory failure with hypoxia: Secondary | ICD-10-CM | POA: Diagnosis not present

## 2023-03-19 DIAGNOSIS — R609 Edema, unspecified: Secondary | ICD-10-CM | POA: Diagnosis not present

## 2023-03-19 DIAGNOSIS — R059 Cough, unspecified: Secondary | ICD-10-CM | POA: Diagnosis not present

## 2023-03-19 DIAGNOSIS — Z87898 Personal history of other specified conditions: Secondary | ICD-10-CM | POA: Diagnosis not present

## 2023-03-19 DIAGNOSIS — N189 Chronic kidney disease, unspecified: Secondary | ICD-10-CM | POA: Diagnosis not present

## 2023-03-19 DIAGNOSIS — B370 Candidal stomatitis: Secondary | ICD-10-CM | POA: Diagnosis not present

## 2023-03-19 DIAGNOSIS — R52 Pain, unspecified: Secondary | ICD-10-CM | POA: Diagnosis not present

## 2023-03-19 DIAGNOSIS — I10 Essential (primary) hypertension: Secondary | ICD-10-CM | POA: Diagnosis not present

## 2023-03-19 DIAGNOSIS — Z9981 Dependence on supplemental oxygen: Secondary | ICD-10-CM | POA: Diagnosis not present

## 2023-03-19 DIAGNOSIS — Z1159 Encounter for screening for other viral diseases: Secondary | ICD-10-CM | POA: Diagnosis not present

## 2023-03-19 DIAGNOSIS — F419 Anxiety disorder, unspecified: Secondary | ICD-10-CM | POA: Diagnosis not present

## 2023-03-19 DIAGNOSIS — U071 COVID-19: Secondary | ICD-10-CM | POA: Diagnosis not present

## 2023-03-19 DIAGNOSIS — E785 Hyperlipidemia, unspecified: Secondary | ICD-10-CM | POA: Diagnosis not present

## 2023-03-19 DIAGNOSIS — T783XXD Angioneurotic edema, subsequent encounter: Secondary | ICD-10-CM | POA: Diagnosis not present

## 2023-03-19 DIAGNOSIS — R000 Tachycardia, unspecified: Secondary | ICD-10-CM | POA: Diagnosis not present

## 2023-03-19 DIAGNOSIS — T783XXA Angioneurotic edema, initial encounter: Secondary | ICD-10-CM | POA: Diagnosis not present

## 2023-03-19 DIAGNOSIS — E039 Hypothyroidism, unspecified: Secondary | ICD-10-CM | POA: Diagnosis not present

## 2023-03-19 DIAGNOSIS — I491 Atrial premature depolarization: Secondary | ICD-10-CM | POA: Diagnosis not present

## 2023-03-19 DIAGNOSIS — R1312 Dysphagia, oropharyngeal phase: Secondary | ICD-10-CM | POA: Diagnosis not present

## 2023-03-19 DIAGNOSIS — Z23 Encounter for immunization: Secondary | ICD-10-CM | POA: Diagnosis not present

## 2023-03-20 DIAGNOSIS — M6281 Muscle weakness (generalized): Secondary | ICD-10-CM | POA: Diagnosis not present

## 2023-03-20 DIAGNOSIS — Z87898 Personal history of other specified conditions: Secondary | ICD-10-CM | POA: Diagnosis not present

## 2023-03-20 DIAGNOSIS — K219 Gastro-esophageal reflux disease without esophagitis: Secondary | ICD-10-CM | POA: Diagnosis not present

## 2023-03-20 DIAGNOSIS — J9610 Chronic respiratory failure, unspecified whether with hypoxia or hypercapnia: Secondary | ICD-10-CM | POA: Diagnosis not present

## 2023-03-20 DIAGNOSIS — N189 Chronic kidney disease, unspecified: Secondary | ICD-10-CM | POA: Diagnosis not present

## 2023-03-20 DIAGNOSIS — R1310 Dysphagia, unspecified: Secondary | ICD-10-CM | POA: Diagnosis not present

## 2023-03-20 DIAGNOSIS — K5900 Constipation, unspecified: Secondary | ICD-10-CM | POA: Diagnosis not present

## 2023-03-20 DIAGNOSIS — Z930 Tracheostomy status: Secondary | ICD-10-CM | POA: Diagnosis not present

## 2023-03-20 DIAGNOSIS — Z9289 Personal history of other medical treatment: Secondary | ICD-10-CM | POA: Diagnosis not present

## 2023-03-20 DIAGNOSIS — R52 Pain, unspecified: Secondary | ICD-10-CM | POA: Diagnosis not present

## 2023-03-20 DIAGNOSIS — Z931 Gastrostomy status: Secondary | ICD-10-CM | POA: Diagnosis not present

## 2023-03-20 DIAGNOSIS — I1310 Hypertensive heart and chronic kidney disease without heart failure, with stage 1 through stage 4 chronic kidney disease, or unspecified chronic kidney disease: Secondary | ICD-10-CM | POA: Diagnosis not present

## 2023-03-21 DIAGNOSIS — R1310 Dysphagia, unspecified: Secondary | ICD-10-CM | POA: Diagnosis not present

## 2023-03-21 DIAGNOSIS — I10 Essential (primary) hypertension: Secondary | ICD-10-CM | POA: Diagnosis not present

## 2023-03-21 DIAGNOSIS — J9601 Acute respiratory failure with hypoxia: Secondary | ICD-10-CM | POA: Diagnosis not present

## 2023-03-21 DIAGNOSIS — Z931 Gastrostomy status: Secondary | ICD-10-CM | POA: Diagnosis not present

## 2023-03-21 DIAGNOSIS — M6281 Muscle weakness (generalized): Secondary | ICD-10-CM | POA: Diagnosis not present

## 2023-03-21 DIAGNOSIS — K219 Gastro-esophageal reflux disease without esophagitis: Secondary | ICD-10-CM | POA: Diagnosis not present

## 2023-03-21 DIAGNOSIS — T783XXA Angioneurotic edema, initial encounter: Secondary | ICD-10-CM | POA: Diagnosis not present

## 2023-03-23 DIAGNOSIS — R1310 Dysphagia, unspecified: Secondary | ICD-10-CM | POA: Diagnosis not present

## 2023-03-23 DIAGNOSIS — Z931 Gastrostomy status: Secondary | ICD-10-CM | POA: Diagnosis not present

## 2023-03-23 DIAGNOSIS — B370 Candidal stomatitis: Secondary | ICD-10-CM | POA: Diagnosis not present

## 2023-03-23 DIAGNOSIS — E039 Hypothyroidism, unspecified: Secondary | ICD-10-CM | POA: Diagnosis not present

## 2023-03-23 DIAGNOSIS — J9610 Chronic respiratory failure, unspecified whether with hypoxia or hypercapnia: Secondary | ICD-10-CM | POA: Diagnosis not present

## 2023-03-23 DIAGNOSIS — I10 Essential (primary) hypertension: Secondary | ICD-10-CM | POA: Diagnosis not present

## 2023-03-23 DIAGNOSIS — E785 Hyperlipidemia, unspecified: Secondary | ICD-10-CM | POA: Diagnosis not present

## 2023-03-23 DIAGNOSIS — M6281 Muscle weakness (generalized): Secondary | ICD-10-CM | POA: Diagnosis not present

## 2023-03-23 DIAGNOSIS — Z930 Tracheostomy status: Secondary | ICD-10-CM | POA: Diagnosis not present

## 2023-03-23 DIAGNOSIS — K219 Gastro-esophageal reflux disease without esophagitis: Secondary | ICD-10-CM | POA: Diagnosis not present

## 2023-03-26 DIAGNOSIS — E785 Hyperlipidemia, unspecified: Secondary | ICD-10-CM | POA: Diagnosis not present

## 2023-03-26 DIAGNOSIS — J9601 Acute respiratory failure with hypoxia: Secondary | ICD-10-CM | POA: Diagnosis not present

## 2023-03-27 DIAGNOSIS — M6281 Muscle weakness (generalized): Secondary | ICD-10-CM | POA: Diagnosis not present

## 2023-03-27 DIAGNOSIS — R1310 Dysphagia, unspecified: Secondary | ICD-10-CM | POA: Diagnosis not present

## 2023-03-27 DIAGNOSIS — Z930 Tracheostomy status: Secondary | ICD-10-CM | POA: Diagnosis not present

## 2023-03-27 DIAGNOSIS — B370 Candidal stomatitis: Secondary | ICD-10-CM | POA: Diagnosis not present

## 2023-03-27 DIAGNOSIS — J9610 Chronic respiratory failure, unspecified whether with hypoxia or hypercapnia: Secondary | ICD-10-CM | POA: Diagnosis not present

## 2023-03-27 DIAGNOSIS — Z931 Gastrostomy status: Secondary | ICD-10-CM | POA: Diagnosis not present

## 2023-03-27 DIAGNOSIS — Z7409 Other reduced mobility: Secondary | ICD-10-CM | POA: Diagnosis not present

## 2023-03-29 DIAGNOSIS — M6281 Muscle weakness (generalized): Secondary | ICD-10-CM | POA: Diagnosis not present

## 2023-03-29 DIAGNOSIS — Z7409 Other reduced mobility: Secondary | ICD-10-CM | POA: Diagnosis not present

## 2023-03-30 ENCOUNTER — Other Ambulatory Visit: Payer: Self-pay | Admitting: Family Medicine

## 2023-03-30 DIAGNOSIS — E039 Hypothyroidism, unspecified: Secondary | ICD-10-CM

## 2023-03-30 DIAGNOSIS — I10 Essential (primary) hypertension: Secondary | ICD-10-CM

## 2023-03-30 DIAGNOSIS — T781XXA Other adverse food reactions, not elsewhere classified, initial encounter: Secondary | ICD-10-CM

## 2023-03-30 DIAGNOSIS — T783XXD Angioneurotic edema, subsequent encounter: Secondary | ICD-10-CM

## 2023-03-30 NOTE — Telephone Encounter (Signed)
Does look like they d/c it due to renal numbers in the hospital)  If she can follow up after the holidays we can check blood pressure and labs and see if she needs it again

## 2023-03-30 NOTE — Telephone Encounter (Signed)
Spoke with pt's spouse and when pt was at the hospital in Oct she was d/c to a rehab center, pt wasn't progressing well so the transferred her to a higher level rehab center in [place]. Pt isn't able to come in for an appt but spouse did say she isn't on the hydrochlorothiazide and the pharmacy sent this auto refill request even though he told them to take this off of her med list.  Spouse did have a concern he is very concern that the allergist couldn't figure out what is causing this anaphylactic reactions pt is having and he is frustrated because he is afraid he is going to lose pt if this continues. Pt does have an appt in a few weeks with a Astronomer.   **Spouse is asking if PCP can go through pt's med list and let him know if there is anything else we can remove off of her list that's not necessary he doesn't want her on any meds unless they are absolutely necessary**   He is okay getting a f/u call back next week

## 2023-03-30 NOTE — Telephone Encounter (Signed)
Look likes hospital d/c med on 02/09/23, please advise

## 2023-04-01 DIAGNOSIS — J9503 Malfunction of tracheostomy stoma: Secondary | ICD-10-CM | POA: Diagnosis not present

## 2023-04-01 DIAGNOSIS — Z7401 Bed confinement status: Secondary | ICD-10-CM | POA: Diagnosis not present

## 2023-04-01 DIAGNOSIS — R6889 Other general symptoms and signs: Secondary | ICD-10-CM | POA: Diagnosis not present

## 2023-04-01 NOTE — Telephone Encounter (Signed)
Thanks for letting me know  I'm glad she is seeing a Astronomer  In regards to other medicines it may be a good idea for her to see one of our pharmacists (can do a phone visit) to review med list and get clarification  Is she interested or has she already done this in the past ?  Thanks

## 2023-04-03 DIAGNOSIS — R410 Disorientation, unspecified: Secondary | ICD-10-CM | POA: Diagnosis not present

## 2023-04-03 DIAGNOSIS — J9610 Chronic respiratory failure, unspecified whether with hypoxia or hypercapnia: Secondary | ICD-10-CM | POA: Diagnosis not present

## 2023-04-03 DIAGNOSIS — M6281 Muscle weakness (generalized): Secondary | ICD-10-CM | POA: Diagnosis not present

## 2023-04-03 DIAGNOSIS — R059 Cough, unspecified: Secondary | ICD-10-CM | POA: Diagnosis not present

## 2023-04-03 DIAGNOSIS — Z930 Tracheostomy status: Secondary | ICD-10-CM | POA: Diagnosis not present

## 2023-04-04 DIAGNOSIS — Z79899 Other long term (current) drug therapy: Secondary | ICD-10-CM | POA: Diagnosis not present

## 2023-04-04 DIAGNOSIS — T781XXA Other adverse food reactions, not elsewhere classified, initial encounter: Secondary | ICD-10-CM | POA: Insufficient documentation

## 2023-04-04 NOTE — Telephone Encounter (Signed)
Husband is okay with phone visit from [place]. Will route to PCP and Lillia Abed   Mr. Sharon [name] # is [phone]

## 2023-04-04 NOTE — Telephone Encounter (Signed)
I put the referral in  Please let us know if you don't hear in 1-2 weeks   

## 2023-04-04 NOTE — Telephone Encounter (Signed)
I put the pharmacy referral in

## 2023-04-05 ENCOUNTER — Telehealth: Payer: Self-pay

## 2023-04-05 NOTE — Progress Notes (Signed)
   Care Guide Note  04/05/2023 Name: Sharon [name] MRN: [id number] DOB: February 15, 1951  Referred by: Tower, Audrie Gallus, MD Reason for referral : Care Coordination (Outreach to schedule with Pharm d )   Sharon [name] is a 72 y.o. year old female who is a primary care patient of Tower, Audrie Gallus, MD. [name] was referred to the pharmacist for assistance related to HTN.    Successful contact was made with the patient to discuss pharmacy services including being ready for the pharmacist to call at least 5 minutes before the scheduled appointment time, to have medication bottles and any blood sugar or blood pressure readings ready for review. The patient agreed to meet with the pharmacist via with the pharmacist via telephone visit on (date/time).  04/06/2023  Penne Lash , RMA     [place]  [facility] Guide  Direct Dial: [phone]  Website: [email or web address]

## 2023-04-06 ENCOUNTER — Other Ambulatory Visit: Payer: Medicare Other | Admitting: Pharmacist

## 2023-04-06 DIAGNOSIS — E785 Hyperlipidemia, unspecified: Secondary | ICD-10-CM | POA: Diagnosis not present

## 2023-04-06 DIAGNOSIS — J9610 Chronic respiratory failure, unspecified whether with hypoxia or hypercapnia: Secondary | ICD-10-CM | POA: Diagnosis not present

## 2023-04-06 DIAGNOSIS — U071 COVID-19: Secondary | ICD-10-CM | POA: Diagnosis not present

## 2023-04-06 DIAGNOSIS — Z930 Tracheostomy status: Secondary | ICD-10-CM | POA: Diagnosis not present

## 2023-04-06 DIAGNOSIS — J9601 Acute respiratory failure with hypoxia: Secondary | ICD-10-CM | POA: Diagnosis not present

## 2023-04-06 DIAGNOSIS — M6281 Muscle weakness (generalized): Secondary | ICD-10-CM | POA: Diagnosis not present

## 2023-04-06 DIAGNOSIS — R410 Disorientation, unspecified: Secondary | ICD-10-CM | POA: Diagnosis not present

## 2023-04-06 NOTE — Progress Notes (Signed)
04/06/2023 Name: Sharon [name] MRN: [id number] DOB: [birth year]  Subjective  Chief Complaint  Patient presents with   Medication Reconcilliation   Care Team: Primary Care Provider: Tower, Audrie Gallus, MD  Reason for visit: HOLY COMO is a 72 y.o. year old female who presented for a telephone visit. They were referred to the pharmacist by their PCP for assistance with  medication review/reconciliation  as requested by patient's husband.  Pt has history of severe allergic reactions/alpha gal. Pt and spouse are interested in a pharmacy visit to review med list in detail and determine if anything could be potentially problematic or not needed (in regards to last hospitalization).  Medication Access/Adherence: ?  Prescription drug coverage: Payor: Multimedia programmer / Plan: UHC MEDICARE / Product Type: *No Product type* / .  - Reports that all medications are affordable: Yes  - Medication packaging: N/A Patient current residing in SNF. All home medications had been stopped at hospital discharge, and SNF has been managing all medications   HPI: History of recurrent angioedema, husband says she has been to the hospital least 14 times in the last year for angioedema. Since last hospitalization, patient has been residing at a skilled nursing facility.   Husband confirms that she is scheduled with an allergy specialist at [facility] in March 2025.   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 Prior to most recent hospitalization, husband confirms they have been avoiding red meats entirely in the household. Husband does the grocery shopping and has also adopted a red meat-free diet. Prior to most recent hospitalization, they had not cut out dairy or gelatin products.   Husband's main goal of today's conversation is to ensure that patient has been appropriately re-started on her chronic medications that were stopped at discharge, and that meds being administered in SNF are appropriate.    Outpatient Encounter Medications as of 04/06/2023  Medication Sig   bisacodyl (DULCOLAX) 10 MG suppository Place 1 suppository (10 mg total) rectally daily as needed for moderate constipation.   dexmedetomidine (PRECEDEX) 400 MCG/100ML SOLN Inject 39-117 mcg/hr into the vein continuous.   diazepam (VALIUM) 5 MG tablet Place 0.5 tablets (2.5 mg total) into feeding tube every 8 (eight) hours.   docusate (COLACE) 50 MG/5ML liquid Place 10 mLs (100 mg total) into feeding tube 2 (two) times daily.   famotidine (PEPCID) 20 MG tablet Place 1 tablet (20 mg total) into feeding tube daily.   fondaparinux (ARIXTRA) 2.5 MG/0.5ML SOLN injection Inject 0.5 mLs (2.5 mg total) into the skin daily.   hydrALAZINE (APRESOLINE) 20 MG/ML injection Inject 0.5-1 mLs (10-20 mg total) into the vein every 6 (six) hours as needed (give for SBP >170).   ipratropium-albuterol (DUONEB) 0.5-2.5 (3) MG/3ML SOLN Take 3 mLs by nebulization every 4 (four) hours as needed.   ondansetron (ZOFRAN) 4 MG/2ML SOLN injection Inject 2 mLs (4 mg total) into the vein every 6 (six) hours as needed for nausea.   oxyCODONE (OXY IR/ROXICODONE) 5 MG immediate release tablet Place 1 tablet (5 mg total) into feeding tube every 6 (six) hours.   QUEtiapine (SEROQUEL) 25 MG tablet Place 1 tablet (25 mg total) into feeding tube 2 (two) times daily.     Assessment and Plan:   Medication Access Reviewed all medications with patient's husband. Nursing facility is managing chronic conditions/blood pressure via IV access. Main potential concern with medication reconcilliation is possibly untreated hypothyroidism, as husband reports levothyroxine is not being given to patient at SNF. Unclear if  TSH has been monitored since hospital discharge.  Advised husband to check with care team at SNF regarding if patient has gotten levothyroxine and/or thyroid labs.  All other medications okay to resume on an outpatient bases over time once patient is stable.  Advised husband to update PCP office once discharge orders are placed.   Alpha-gal syndrome:  Reviewed with husband general cautions (dietary) w alpha-gal allergy. Alpha-gal and beef antibodies present on diagnostic labs, though as an added precaution, reviewed more comprehensive list of possible triggers in select patients.  Meat that comes from mammals, like beef, pork, and lamb "Organ meats," such as kidney and liver Milk and dairy products Gelatin (present in many things - check labels  Cautious with gel-caps and capsules (have local pharmacist check ingredient list) Frequently added in desserts/candy/frosted items  Patient/husband were provided with direct office line for any additional medication questions/concerns.   Loree Fee, PharmD Clinical Pharmacist [facility] [phone]

## 2023-04-11 DIAGNOSIS — Z930 Tracheostomy status: Secondary | ICD-10-CM | POA: Diagnosis not present

## 2023-04-11 DIAGNOSIS — Z931 Gastrostomy status: Secondary | ICD-10-CM | POA: Diagnosis not present

## 2023-04-11 DIAGNOSIS — R1310 Dysphagia, unspecified: Secondary | ICD-10-CM | POA: Diagnosis not present

## 2023-04-11 DIAGNOSIS — Z87898 Personal history of other specified conditions: Secondary | ICD-10-CM | POA: Diagnosis not present

## 2023-04-11 DIAGNOSIS — Z9289 Personal history of other medical treatment: Secondary | ICD-10-CM | POA: Diagnosis not present

## 2023-04-11 DIAGNOSIS — U071 COVID-19: Secondary | ICD-10-CM | POA: Diagnosis not present

## 2023-04-13 DIAGNOSIS — J9610 Chronic respiratory failure, unspecified whether with hypoxia or hypercapnia: Secondary | ICD-10-CM | POA: Diagnosis not present

## 2023-04-13 DIAGNOSIS — E039 Hypothyroidism, unspecified: Secondary | ICD-10-CM | POA: Diagnosis not present

## 2023-04-13 DIAGNOSIS — Z930 Tracheostomy status: Secondary | ICD-10-CM | POA: Diagnosis not present

## 2023-04-13 DIAGNOSIS — I10 Essential (primary) hypertension: Secondary | ICD-10-CM | POA: Diagnosis not present

## 2023-04-13 DIAGNOSIS — R1310 Dysphagia, unspecified: Secondary | ICD-10-CM | POA: Diagnosis not present

## 2023-04-13 DIAGNOSIS — Z87898 Personal history of other specified conditions: Secondary | ICD-10-CM | POA: Diagnosis not present

## 2023-04-13 DIAGNOSIS — U071 COVID-19: Secondary | ICD-10-CM | POA: Diagnosis not present

## 2023-04-13 DIAGNOSIS — Z9289 Personal history of other medical treatment: Secondary | ICD-10-CM | POA: Diagnosis not present

## 2023-04-13 DIAGNOSIS — Z7409 Other reduced mobility: Secondary | ICD-10-CM | POA: Diagnosis not present

## 2023-04-13 DIAGNOSIS — K219 Gastro-esophageal reflux disease without esophagitis: Secondary | ICD-10-CM | POA: Diagnosis not present

## 2023-04-13 DIAGNOSIS — M6281 Muscle weakness (generalized): Secondary | ICD-10-CM | POA: Diagnosis not present

## 2023-04-13 DIAGNOSIS — Z931 Gastrostomy status: Secondary | ICD-10-CM | POA: Diagnosis not present

## 2023-04-13 DIAGNOSIS — E785 Hyperlipidemia, unspecified: Secondary | ICD-10-CM | POA: Diagnosis not present

## 2023-04-16 DIAGNOSIS — Z7409 Other reduced mobility: Secondary | ICD-10-CM | POA: Diagnosis not present

## 2023-04-16 DIAGNOSIS — J9610 Chronic respiratory failure, unspecified whether with hypoxia or hypercapnia: Secondary | ICD-10-CM | POA: Diagnosis not present

## 2023-04-16 DIAGNOSIS — R1310 Dysphagia, unspecified: Secondary | ICD-10-CM | POA: Diagnosis not present

## 2023-04-16 DIAGNOSIS — Z930 Tracheostomy status: Secondary | ICD-10-CM | POA: Diagnosis not present

## 2023-04-16 DIAGNOSIS — Z931 Gastrostomy status: Secondary | ICD-10-CM | POA: Diagnosis not present

## 2023-04-17 DIAGNOSIS — Z931 Gastrostomy status: Secondary | ICD-10-CM | POA: Diagnosis not present

## 2023-04-17 DIAGNOSIS — Z9289 Personal history of other medical treatment: Secondary | ICD-10-CM | POA: Diagnosis not present

## 2023-04-17 DIAGNOSIS — Z930 Tracheostomy status: Secondary | ICD-10-CM | POA: Diagnosis not present

## 2023-04-17 DIAGNOSIS — J9610 Chronic respiratory failure, unspecified whether with hypoxia or hypercapnia: Secondary | ICD-10-CM | POA: Diagnosis not present

## 2023-04-17 DIAGNOSIS — Z87898 Personal history of other specified conditions: Secondary | ICD-10-CM | POA: Diagnosis not present

## 2023-04-17 DIAGNOSIS — M6281 Muscle weakness (generalized): Secondary | ICD-10-CM | POA: Diagnosis not present

## 2023-04-17 DIAGNOSIS — Z7409 Other reduced mobility: Secondary | ICD-10-CM | POA: Diagnosis not present

## 2023-04-23 DIAGNOSIS — Z931 Gastrostomy status: Secondary | ICD-10-CM | POA: Diagnosis not present

## 2023-04-23 DIAGNOSIS — R410 Disorientation, unspecified: Secondary | ICD-10-CM | POA: Diagnosis not present

## 2023-04-23 DIAGNOSIS — K219 Gastro-esophageal reflux disease without esophagitis: Secondary | ICD-10-CM | POA: Diagnosis not present

## 2023-04-23 DIAGNOSIS — R1310 Dysphagia, unspecified: Secondary | ICD-10-CM | POA: Diagnosis not present

## 2023-04-23 DIAGNOSIS — N189 Chronic kidney disease, unspecified: Secondary | ICD-10-CM | POA: Diagnosis not present

## 2023-04-23 DIAGNOSIS — Z9289 Personal history of other medical treatment: Secondary | ICD-10-CM | POA: Diagnosis not present

## 2023-04-23 DIAGNOSIS — Z87898 Personal history of other specified conditions: Secondary | ICD-10-CM | POA: Diagnosis not present

## 2023-04-23 DIAGNOSIS — I10 Essential (primary) hypertension: Secondary | ICD-10-CM | POA: Diagnosis not present

## 2023-04-23 DIAGNOSIS — J9610 Chronic respiratory failure, unspecified whether with hypoxia or hypercapnia: Secondary | ICD-10-CM | POA: Diagnosis not present

## 2023-04-23 DIAGNOSIS — Z930 Tracheostomy status: Secondary | ICD-10-CM | POA: Diagnosis not present

## 2023-04-23 DIAGNOSIS — M6281 Muscle weakness (generalized): Secondary | ICD-10-CM | POA: Diagnosis not present

## 2023-04-25 DIAGNOSIS — Z7401 Bed confinement status: Secondary | ICD-10-CM | POA: Diagnosis not present

## 2023-04-25 DIAGNOSIS — T783XXA Angioneurotic edema, initial encounter: Secondary | ICD-10-CM | POA: Diagnosis not present

## 2023-04-25 DIAGNOSIS — R6889 Other general symptoms and signs: Secondary | ICD-10-CM | POA: Diagnosis not present

## 2023-04-25 DIAGNOSIS — Z743 Need for continuous supervision: Secondary | ICD-10-CM | POA: Diagnosis not present

## 2023-04-25 HISTORY — PX: PEG TUBE REMOVAL: SHX2187

## 2023-04-26 DIAGNOSIS — T783XXA Angioneurotic edema, initial encounter: Secondary | ICD-10-CM | POA: Diagnosis not present

## 2023-04-26 DIAGNOSIS — I491 Atrial premature depolarization: Secondary | ICD-10-CM | POA: Diagnosis not present

## 2023-04-26 DIAGNOSIS — M6281 Muscle weakness (generalized): Secondary | ICD-10-CM | POA: Diagnosis not present

## 2023-04-26 DIAGNOSIS — J9610 Chronic respiratory failure, unspecified whether with hypoxia or hypercapnia: Secondary | ICD-10-CM | POA: Diagnosis not present

## 2023-04-26 DIAGNOSIS — Z930 Tracheostomy status: Secondary | ICD-10-CM | POA: Diagnosis not present

## 2023-04-26 DIAGNOSIS — R000 Tachycardia, unspecified: Secondary | ICD-10-CM | POA: Diagnosis not present

## 2023-04-26 DIAGNOSIS — Z7409 Other reduced mobility: Secondary | ICD-10-CM | POA: Diagnosis not present

## 2023-04-26 DIAGNOSIS — Z87898 Personal history of other specified conditions: Secondary | ICD-10-CM | POA: Diagnosis not present

## 2023-04-27 DIAGNOSIS — J9601 Acute respiratory failure with hypoxia: Secondary | ICD-10-CM | POA: Diagnosis not present

## 2023-04-27 DIAGNOSIS — E785 Hyperlipidemia, unspecified: Secondary | ICD-10-CM | POA: Diagnosis not present

## 2023-04-30 DIAGNOSIS — J9601 Acute respiratory failure with hypoxia: Secondary | ICD-10-CM | POA: Diagnosis not present

## 2023-04-30 DIAGNOSIS — E785 Hyperlipidemia, unspecified: Secondary | ICD-10-CM | POA: Diagnosis not present

## 2023-05-01 DIAGNOSIS — Z9289 Personal history of other medical treatment: Secondary | ICD-10-CM | POA: Diagnosis not present

## 2023-05-01 DIAGNOSIS — R1310 Dysphagia, unspecified: Secondary | ICD-10-CM | POA: Diagnosis not present

## 2023-05-01 DIAGNOSIS — Z931 Gastrostomy status: Secondary | ICD-10-CM | POA: Diagnosis not present

## 2023-05-01 DIAGNOSIS — M6281 Muscle weakness (generalized): Secondary | ICD-10-CM | POA: Diagnosis not present

## 2023-05-02 DIAGNOSIS — G8929 Other chronic pain: Secondary | ICD-10-CM | POA: Diagnosis not present

## 2023-05-03 DIAGNOSIS — Z87898 Personal history of other specified conditions: Secondary | ICD-10-CM | POA: Diagnosis not present

## 2023-05-03 DIAGNOSIS — M6281 Muscle weakness (generalized): Secondary | ICD-10-CM | POA: Diagnosis not present

## 2023-05-03 DIAGNOSIS — I10 Essential (primary) hypertension: Secondary | ICD-10-CM | POA: Diagnosis not present

## 2023-05-03 DIAGNOSIS — R1310 Dysphagia, unspecified: Secondary | ICD-10-CM | POA: Diagnosis not present

## 2023-05-03 DIAGNOSIS — Z91014 Allergy to mammalian meats: Secondary | ICD-10-CM | POA: Diagnosis not present

## 2023-05-03 DIAGNOSIS — Z930 Tracheostomy status: Secondary | ICD-10-CM | POA: Diagnosis not present

## 2023-05-04 DIAGNOSIS — I1310 Hypertensive heart and chronic kidney disease without heart failure, with stage 1 through stage 4 chronic kidney disease, or unspecified chronic kidney disease: Secondary | ICD-10-CM | POA: Diagnosis not present

## 2023-05-04 DIAGNOSIS — M6281 Muscle weakness (generalized): Secondary | ICD-10-CM | POA: Diagnosis not present

## 2023-05-04 DIAGNOSIS — Z87898 Personal history of other specified conditions: Secondary | ICD-10-CM | POA: Diagnosis not present

## 2023-05-04 DIAGNOSIS — N189 Chronic kidney disease, unspecified: Secondary | ICD-10-CM | POA: Diagnosis not present

## 2023-05-07 DIAGNOSIS — M6281 Muscle weakness (generalized): Secondary | ICD-10-CM | POA: Diagnosis not present

## 2023-05-07 DIAGNOSIS — Z741 Need for assistance with personal care: Secondary | ICD-10-CM | POA: Diagnosis not present

## 2023-05-07 DIAGNOSIS — N189 Chronic kidney disease, unspecified: Secondary | ICD-10-CM | POA: Diagnosis not present

## 2023-05-07 DIAGNOSIS — I1310 Hypertensive heart and chronic kidney disease without heart failure, with stage 1 through stage 4 chronic kidney disease, or unspecified chronic kidney disease: Secondary | ICD-10-CM | POA: Diagnosis not present

## 2023-05-08 DIAGNOSIS — I10 Essential (primary) hypertension: Secondary | ICD-10-CM | POA: Diagnosis not present

## 2023-05-08 DIAGNOSIS — J9610 Chronic respiratory failure, unspecified whether with hypoxia or hypercapnia: Secondary | ICD-10-CM | POA: Diagnosis not present

## 2023-05-08 DIAGNOSIS — R1310 Dysphagia, unspecified: Secondary | ICD-10-CM | POA: Diagnosis not present

## 2023-05-08 DIAGNOSIS — Z930 Tracheostomy status: Secondary | ICD-10-CM | POA: Diagnosis not present

## 2023-05-08 DIAGNOSIS — Z931 Gastrostomy status: Secondary | ICD-10-CM | POA: Diagnosis not present

## 2023-05-08 DIAGNOSIS — Z7409 Other reduced mobility: Secondary | ICD-10-CM | POA: Diagnosis not present

## 2023-05-10 DIAGNOSIS — I1310 Hypertensive heart and chronic kidney disease without heart failure, with stage 1 through stage 4 chronic kidney disease, or unspecified chronic kidney disease: Secondary | ICD-10-CM | POA: Diagnosis not present

## 2023-05-10 DIAGNOSIS — Z930 Tracheostomy status: Secondary | ICD-10-CM | POA: Diagnosis not present

## 2023-05-10 DIAGNOSIS — I10 Essential (primary) hypertension: Secondary | ICD-10-CM | POA: Diagnosis not present

## 2023-05-10 DIAGNOSIS — J9610 Chronic respiratory failure, unspecified whether with hypoxia or hypercapnia: Secondary | ICD-10-CM | POA: Diagnosis not present

## 2023-05-10 DIAGNOSIS — R1310 Dysphagia, unspecified: Secondary | ICD-10-CM | POA: Diagnosis not present

## 2023-05-10 DIAGNOSIS — Z7409 Other reduced mobility: Secondary | ICD-10-CM | POA: Diagnosis not present

## 2023-05-10 DIAGNOSIS — N189 Chronic kidney disease, unspecified: Secondary | ICD-10-CM | POA: Diagnosis not present

## 2023-05-12 ENCOUNTER — Other Ambulatory Visit: Payer: Self-pay

## 2023-05-12 DIAGNOSIS — I10 Essential (primary) hypertension: Secondary | ICD-10-CM | POA: Diagnosis not present

## 2023-05-12 DIAGNOSIS — N3000 Acute cystitis without hematuria: Secondary | ICD-10-CM | POA: Diagnosis not present

## 2023-05-12 DIAGNOSIS — Z430 Encounter for attention to tracheostomy: Secondary | ICD-10-CM | POA: Diagnosis not present

## 2023-05-12 DIAGNOSIS — J9509 Other tracheostomy complication: Secondary | ICD-10-CM | POA: Diagnosis not present

## 2023-05-12 NOTE — ED Triage Notes (Signed)
Patient C/O tracheostomy feeling loose and states that she would like it evaluated. She states that her cannula become dislodged on Friday, staff at her rehab facility replaced it, but it has not felt right since then. Patient denies any respiratory symptoms or pain at this time. 98% on room air.

## 2023-05-13 ENCOUNTER — Emergency Department
Admission: EM | Admit: 2023-05-13 | Discharge: 2023-05-13 | Disposition: A | Discharge status: Home / Self Care | Payer: Medicare Other | Attending: Emergency Medicine | Admitting: Emergency Medicine

## 2023-05-13 DIAGNOSIS — N3000 Acute cystitis without hematuria: Secondary | ICD-10-CM

## 2023-05-13 DIAGNOSIS — Z430 Encounter for attention to tracheostomy: Secondary | ICD-10-CM

## 2023-05-13 LAB — URINALYSIS, ROUTINE W REFLEX MICROSCOPIC
Bilirubin Urine: NEGATIVE
Glucose, UA: NEGATIVE mg/dL
Hgb urine dipstick: NEGATIVE
Ketones, ur: NEGATIVE mg/dL
Nitrite: NEGATIVE
Protein, ur: NEGATIVE mg/dL
Specific Gravity, Urine: 1.004 — ABNORMAL LOW (ref 1.005–1.030)
pH: 6 (ref 5.0–8.0)

## 2023-05-13 MED ORDER — CEPHALEXIN 500 MG PO CAPS: 500.0000 mg | ORAL_CAPSULE | Freq: Four times a day (QID) | ORAL | 0 refills | Status: AC

## 2023-05-13 MED ORDER — CEPHALEXIN 500 MG PO CAPS
500.0000 mg | ORAL_CAPSULE | Freq: Once | ORAL | Status: AC
  Administered 2023-05-13: 500 mg via ORAL
  Filled 2023-05-13: qty 1

## 2023-05-13 NOTE — ED Provider Notes (Signed)
[facility] Provider Note    Event Date/Time   First MD Initiated Contact with Patient 05/13/23 [phone]     (approximate)   History   Chief Complaint Tracheostomy Tube Change   HPI  Sharon [name] is a 73 y.o. female with past medical history of hypertension, hyperlipidemia, and angioedema status post tracheostomy who presents to the ED complaining of tracheostomy malfunction.  Patient reports that for the past few days it has felt like her trach has been loose and causes her to cough when she lays flat.  The tube had become dislodged 2 days ago and was replaced by staff at her rehab facility, but has not felt right since then.  She denies any difficulty breathing and has not had any fevers or pain in her chest.  She does endorse recent dysuria, and abdominal pain or flank plain.     Physical Exam   Triage Vital Signs: ED Triage Vitals [05/12/23 2347]  Encounter Vitals Group     BP (!) 159/91     Systolic BP Percentile      Diastolic BP Percentile      Pulse Rate (!) 106     Resp 20     Temp 97.9 F (36.6 C)     Temp Source Oral     SpO2 98 %     Weight      Height      Head Circumference      Peak Flow      Pain Score      Pain Loc      Pain Education      Exclude from Growth Chart     Most recent vital signs: Vitals:   05/13/23 0100 05/13/23 0145  BP: (!) 152/84 (!) 166/81  Pulse: 96 89  Resp: 16 18  Temp:    SpO2:  98%    Constitutional: Alert and oriented. Eyes: Conjunctivae are normal. Head: Atraumatic. Neck: Tracheostomy site with small amount of mucus, no erythema, edema, warmth, or tenderness noted. Nose: No congestion/rhinnorhea. Mouth/Throat: Mucous membranes are moist.  Cardiovascular: Normal rate, regular rhythm. Grossly normal heart sounds.  2+ radial pulses bilaterally. Respiratory: Normal respiratory effort.  No retractions. Lungs CTAB. Gastrointestinal: Soft and nontender. No distention.  No CVA tenderness  bilaterally. Musculoskeletal: No lower extremity tenderness nor edema.  Neurologic:  Normal speech and language. No gross focal neurologic deficits are appreciated.    ED Results / Procedures / Treatments   Labs (all labs ordered are listed, but only abnormal results are displayed) Labs Reviewed  URINALYSIS, ROUTINE W REFLEX MICROSCOPIC - Abnormal; Notable for the following components:      Result Value   Color, Urine YELLOW (*)    APPearance HAZY (*)    Specific Gravity, Urine 1.004 (*)    Leukocytes,Ua MODERATE (*)    Bacteria, UA MANY (*)    All other components within normal limits  URINE CULTURE     PROCEDURES:  Critical Care performed: No  Procedures   MEDICATIONS ORDERED IN ED: Medications  cephALEXin (KEFLEX) capsule 500 mg (500 mg Oral Given 05/13/23 0332)     IMPRESSION / MDM / ASSESSMENT AND PLAN / ED COURSE  I reviewed the triage vital signs and the nursing notes.                              73 y.o. female with past medical history of hypertension,  hyperlipidemia, and angioedema status post tracheostomy who presents to the ED complaining of tracheostomy feeling loose along with dysuria.  Patient's presentation is most consistent with acute complicated illness / injury requiring diagnostic workup.  Differential diagnosis includes, but is not limited to, displaced tracheostomy, tracheostomy site infection, UTI.  Patient nontoxic-appearing and in no acute distress, vital signs are unremarkable.  Respiratory therapist was able to clean and adjust her tracheostomy with improvement in patient's symptoms, she reports it feels much better with tightening of straps.  Urinalysis is concerning for UTI, urine was sent for culture and we will treat with Keflex.  No findings concerning for sepsis or pyelonephritis, patient appropriate for outpatient management.  She was counseled to follow-up with her PCP and to return to the ED for new or worsening symptoms, patient and  spouse agree with plan.      FINAL CLINICAL IMPRESSION(S) / ED DIAGNOSES   Final diagnoses:  Tracheostomy care (HCC)  Acute cystitis without hematuria     Rx / DC Orders   ED Discharge Orders          Ordered    cephALEXin (KEFLEX) 500 MG capsule  4 times daily        05/13/23 0327             Note:  This document was prepared using Dragon voice recognition software and may include unintentional dictation errors.   Chesley Noon, MD 05/13/23 [phone]

## 2023-05-14 ENCOUNTER — Encounter: Payer: Self-pay | Admitting: Internal Medicine

## 2023-05-15 ENCOUNTER — Encounter: Payer: Self-pay | Admitting: Family Medicine

## 2023-05-15 ENCOUNTER — Ambulatory Visit: Payer: Medicare Other | Admitting: Family Medicine

## 2023-05-15 VITALS — BP 137/75 | HR 91 | Temp 99.0°F | Ht 66.5 in | Wt 207.2 lb

## 2023-05-15 DIAGNOSIS — R7303 Prediabetes: Secondary | ICD-10-CM | POA: Diagnosis not present

## 2023-05-15 DIAGNOSIS — T783XXD Angioneurotic edema, subsequent encounter: Secondary | ICD-10-CM

## 2023-05-15 DIAGNOSIS — Z79899 Other long term (current) drug therapy: Secondary | ICD-10-CM

## 2023-05-15 DIAGNOSIS — B9620 Unspecified Escherichia coli [E. coli] as the cause of diseases classified elsewhere: Secondary | ICD-10-CM

## 2023-05-15 DIAGNOSIS — T781XXA Other adverse food reactions, not elsewhere classified, initial encounter: Secondary | ICD-10-CM | POA: Diagnosis not present

## 2023-05-15 DIAGNOSIS — E6609 Other obesity due to excess calories: Secondary | ICD-10-CM

## 2023-05-15 DIAGNOSIS — K219 Gastro-esophageal reflux disease without esophagitis: Secondary | ICD-10-CM

## 2023-05-15 DIAGNOSIS — E66811 Obesity, class 1: Secondary | ICD-10-CM

## 2023-05-15 DIAGNOSIS — E039 Hypothyroidism, unspecified: Secondary | ICD-10-CM | POA: Diagnosis not present

## 2023-05-15 DIAGNOSIS — D649 Anemia, unspecified: Secondary | ICD-10-CM

## 2023-05-15 DIAGNOSIS — Z931 Gastrostomy status: Secondary | ICD-10-CM

## 2023-05-15 DIAGNOSIS — I10 Essential (primary) hypertension: Secondary | ICD-10-CM

## 2023-05-15 DIAGNOSIS — I5032 Chronic diastolic (congestive) heart failure: Secondary | ICD-10-CM

## 2023-05-15 DIAGNOSIS — E538 Deficiency of other specified B group vitamins: Secondary | ICD-10-CM | POA: Diagnosis not present

## 2023-05-15 DIAGNOSIS — Z6832 Body mass index (BMI) 32.0-32.9, adult: Secondary | ICD-10-CM

## 2023-05-15 DIAGNOSIS — R600 Localized edema: Secondary | ICD-10-CM

## 2023-05-15 DIAGNOSIS — Z930 Tracheostomy status: Secondary | ICD-10-CM

## 2023-05-15 DIAGNOSIS — N390 Urinary tract infection, site not specified: Secondary | ICD-10-CM

## 2023-05-15 DIAGNOSIS — R4189 Other symptoms and signs involving cognitive functions and awareness: Secondary | ICD-10-CM

## 2023-05-15 LAB — CBC WITH DIFFERENTIAL/PLATELET
Basophils Absolute: 0 10*3/uL (ref 0.0–0.1)
Basophils Relative: 0.6 % (ref 0.0–3.0)
Eosinophils Absolute: 0.3 10*3/uL (ref 0.0–0.7)
Eosinophils Relative: 3.6 % (ref 0.0–5.0)
HCT: 30.9 % — ABNORMAL LOW (ref 36.0–46.0)
Hemoglobin: 10 g/dL — ABNORMAL LOW (ref 12.0–15.0)
Lymphocytes Relative: 33.9 % (ref 12.0–46.0)
Lymphs Abs: 2.5 10*3/uL (ref 0.7–4.0)
MCHC: 32.2 g/dL (ref 30.0–36.0)
MCV: 81.4 fL (ref 78.0–100.0)
Monocytes Absolute: 0.6 10*3/uL (ref 0.1–1.0)
Monocytes Relative: 7.9 % (ref 3.0–12.0)
Neutro Abs: 3.9 10*3/uL (ref 1.4–7.7)
Neutrophils Relative %: 54 % (ref 43.0–77.0)
Platelets: 391 10*3/uL (ref 150.0–400.0)
RBC: 3.8 Mil/uL — ABNORMAL LOW (ref 3.87–5.11)
RDW: 17.4 % — ABNORMAL HIGH (ref 11.5–15.5)
WBC: 7.3 10*3/uL (ref 4.0–10.5)

## 2023-05-15 LAB — COMPREHENSIVE METABOLIC PANEL
ALT: 12 U/L (ref 0–35)
AST: 22 U/L (ref 0–37)
Albumin: 3.8 g/dL (ref 3.5–5.2)
Alkaline Phosphatase: 80 U/L (ref 39–117)
BUN: 6 mg/dL (ref 6–23)
CO2: 27 meq/L (ref 19–32)
Calcium: 9.4 mg/dL (ref 8.4–10.5)
Chloride: 104 meq/L (ref 96–112)
Creatinine, Ser: 0.78 mg/dL (ref 0.40–1.20)
GFR: 75.93 mL/min (ref >60.00)
Glucose, Bld: 98 mg/dL (ref 70–99)
Potassium: 4.5 meq/L (ref 3.5–5.1)
Sodium: 139 meq/L (ref 135–145)
Total Bilirubin: 0.4 mg/dL (ref 0.2–1.2)
Total Protein: 6.6 g/dL (ref 6.0–8.3)

## 2023-05-15 LAB — URINE CULTURE: Culture: >=100000 — AB

## 2023-05-15 LAB — TSH: TSH: 2.39 u[IU]/mL (ref 0.35–5.50)

## 2023-05-15 LAB — VITAMIN B12: Vitamin B-12: 434 pg/mL (ref 211–911)

## 2023-05-15 LAB — HEMOGLOBIN A1C: Hgb A1c MFr Bld: 6.2 % (ref 4.6–6.5)

## 2023-05-15 MED ORDER — CYANOCOBALAMIN 1000 MCG/ML IJ SOLN
1000.0000 ug | Freq: Once | INTRAMUSCULAR | Status: AC
Start: 2023-05-15 — End: 2023-05-15
  Administered 2023-05-15: 1000 ug via INTRAMUSCULAR

## 2023-05-15 NOTE — Addendum Note (Signed)
Addended by: Roxy Manns A on: 05/15/2023 08:06 PM   Modules accepted: Orders

## 2023-05-15 NOTE — Assessment & Plan Note (Signed)
Hosp in [place]  Reviewed hospital records, lab results and studies in detail  Trach and also PEG tube in place Recently home from rehab   Going to keep trach until she sees allergist at [facility] in march  Overall doing well Has alpha gal allergy Now also avoiding gelatin   Call back and Er precautions noted in detail today

## 2023-05-15 NOTE — Assessment & Plan Note (Signed)
TSH today  Will see if we need to re start treatment  Feels fair  Did have traumatic fall with trach and PEG placement

## 2023-05-15 NOTE — Progress Notes (Addendum)
Subjective:    Patient ID: Sharon [name], female    DOB: [birth year], 73 y.o.   MRN: [id number]  HPI  Wt Readings from Last 3 Encounters:  05/15/23 207 lb 4 oz (94 kg)  02/09/23 118 lb 6.2 oz (53.7 kg)  01/09/23 211 lb 4 oz (95.8 kg)   32.95 kg/m  Vitals:   05/15/23 1151 05/15/23 1248  BP: (!) 142/96 137/75  Pulse: 91   Temp: 99 F (37.2 C)   SpO2: 97%    Pt presents for follow up of hospitalization for angioedema  In setting of alpha -gal syndrome   First hosp was 02/01/23  Had to have emergent nasal intubation and trach for severe resp failure in setting of angioedema  On vent temporarily  Discharged to SNF  Tracheostomy was done by Dr Andee Poles and Dr Jenne Campus on 10/15   Did have a pharmacy visit with Berenice Primas for med reconciliation  Did have discussion re: avoiding gelatin in light of alpha gal  Possible milk/dairy   She did have to return to ER on 1/19 for trach tube change after a malfunction (became loose and caused cough when lying down after being dislodged) Also dysuria   Has G tube also   Is at home now -out of rehab for 5 days  Getting stronger  Working on balance  Can make full strides when walking and can go up and down steps   HH will start soon   Janina Mayo was cleaned and adjusted -better now    Had recent positive urine culture for e coli- 2 types Resistent ot amp Treatment with cephalexin  Is improved -did take longer than she thought   Is drinking fluids    Has appointment with allergist in late march at [facility]   HTN bp is stable today  No cp or palpitations or headaches or edema  No side effects to medicines  BP Readings from Last 3 Encounters:  05/15/23 137/75  05/13/23 (!) 158/82  02/09/23 (!) 169/77    Pulse Readings from Last 3 Encounters:  05/15/23 91  05/13/23 88  02/09/23 66   Taking hydralazine 25 mg tid  Blood pressure has been elevated   Ankles and feet are swollen   Woozy at times   Noted some yellow  color -comes and goes  No palpitations  No chest pain   Has chronic burning in stomach - may be from PEG tube  Gets it out tomorrow  Takes pepcid 20 mg daily    Some brain fog-that is getting better   Needs a referral to neurology  Memory is bad  Some post trauma   Alcohol once in a while  Since home  (1.5 oz drink twice in past 5 days)  Not daily   Tylenol - took a few when uti hurt  Not since     Lab Results  Component Value Date   NA 139 05/15/2023   K 4.5 05/15/2023   CO2 27 05/15/2023   GLUCOSE 98 05/15/2023   BUN 6 05/15/2023   CREATININE 0.78 05/15/2023   CALCIUM 9.4 05/15/2023   GFR 75.93 05/15/2023   GFRNONAA >60 02/09/2023    Lab Results  Component Value Date   WBC 7.3 05/15/2023   HGB 10.0 (L) 05/15/2023   HCT 30.9 (L) 05/15/2023   MCV 81.4 05/15/2023   PLT 391.0 05/15/2023   Lab Results  Component Value Date   HGBA1C 6.2 05/15/2023   Lab Results  Component Value  Date   ALT 12 05/15/2023   AST 22 05/15/2023   ALKPHOS 80 05/15/2023   BILITOT 0.4 05/15/2023      Mood Zoloft 25 mg daily  No side effects or problems   No wheeze or shortness of breath  Some cough    Hypothyroidism  Pt has no clinical changes No change in energy level/ hair or skin/ edema and no tremor Lab Results  Component Value Date   TSH 2.39 05/15/2023    No thyroid replacement currently Wants to re check this       Patient Active Problem List   Diagnosis Date Noted   E. coli UTI (urinary tract infection) 05/15/2023   Brain fog 05/15/2023   S/P percutaneous endoscopic gastrostomy (PEG) tube placement (HCC) 05/15/2023   Tracheostomy status (HCC) 05/15/2023   Allergic reaction to alpha-gal 04/04/2023   Chronic diastolic heart failure (HCC) 01/09/2023   Snoring 01/09/2023   Vitamin B12 deficiency 01/09/2023   Current use of proton pump inhibitor 01/01/2023   Angioedema due to angiotensin converting enzyme inhibitor (ACE-I) 02/27/2022   SVT  (supraventricular tachycardia) (HCC) 02/22/2022   Urinary incontinence 12/09/2021   Pedal edema 07/26/2021   Angioedema 04/17/2021   Dizziness 08/23/2020   Fatigue 08/23/2020   Prediabetes 12/17/2019   Medicare annual wellness visit, subsequent 08/28/2018   Cough 01/28/2018   Welcome to Medicare preventive visit 08/22/2016   Obesity 10/05/2014   Anemia 09/24/2013   Palpitations 03/03/2013   Colon cancer screening 03/03/2013   Hypokalemia 10/09/2011   Routine general medical examination at a health care facility 10/02/2011   Vitamin D deficiency disease 12/28/2010   ARTHRALGIA 08/19/2007   Hypothyroidism 01/31/2007   Hyperlipidemia 01/31/2007   Anxiety state 01/31/2007   Essential hypertension 01/31/2007   GERD 01/31/2007   Past Medical History:  Diagnosis Date   Angio-edema    Anxiety    Cataract    GERD (gastroesophageal reflux disease)    Hyperlipidemia    Hypertension    Hypothyroidism    Vitamin D deficiency    Past Surgical History:  Procedure Laterality Date   CATARACT EXTRACTION W/ INTRAOCULAR LENS IMPLANT Right 12/29/2016   Dr. Mia Creek   CATARACT EXTRACTION W/ INTRAOCULAR LENS IMPLANT Left 01/15/2017   Dr. Marcial Pacas Bevis   COLONOSCOPY     INTUBATION-ENDOTRACHEAL WITH TRACHEOSTOMY STANDBY N/A 02/01/2023   Procedure: INTUBATION-ENDOTRACHEAL WITH TRACHEOSTOMY STANDBY;  Surgeon: Linus Salmons, MD;  Location: [facility] ORS;  Service: ENT;  Laterality: N/A;   LEEP     TRACHEOSTOMY TUBE PLACEMENT N/A 02/06/2023   Procedure: TRACHEOSTOMY;  Surgeon: Bud Face, MD;  Location: [facility] ORS;  Service: ENT;  Laterality: N/A;   TUBAL LIGATION     VEIN LIGATION AND STRIPPING     x2   Social History   Tobacco Use   Smoking status: Former    Current packs/day: 0.00    Types: Cigarettes    Quit date: 04/24/1996    Years since quitting: 27.0   Smokeless tobacco: Never  Vaping Use   Vaping status: Never Used  Substance Use Topics   Alcohol use: Yes     Alcohol/week: 4.0 standard drinks of alcohol    Types: 4 Shots of liquor per week    Comment: occasional   Drug use: No   Family History  Problem Relation Age of Onset   Transient ischemic attack Mother    Hypertension Mother    Hypertension Father    Colon cancer Neg Hx    Esophageal cancer  Neg Hx    Rectal cancer Neg Hx    Stomach cancer Neg Hx    Allergies  Allergen Reactions   Cozaar [Losartan] Swelling   Gelatin Anaphylaxis   Septra [Sulfamethoxazole-Trimethoprim] Anaphylaxis   Zinc Gelatin [Zinc] Anaphylaxis   Alpha-Gal    Amlodipine Besylate     REACTION: edema   Atorvastatin     REACTION: muscle pain   Beef Allergy    Cat Dander    Dust Mite Extract    Neomycin-Bacitracin Zn-Polymyx     REACTION: rash Per pt not sure if from meds or not   Current Outpatient Medications on File Prior to Visit  Medication Sig Dispense Refill   cephALEXin (KEFLEX) 500 MG capsule Take 1 capsule (500 mg total) by mouth 4 (four) times daily for 5 days. 20 capsule 0   EPINEPHrine 0.3 mg/0.3 mL IJ SOAJ injection Inject 0.3 mg into the muscle as needed.     famotidine (PEPCID) 20 MG tablet Place 1 tablet (20 mg total) into feeding tube daily.     hydrALAZINE (APRESOLINE) 25 MG tablet Take 25 mg by mouth every 8 (eight) hours.     ipratropium-albuterol (DUONEB) 0.5-2.5 (3) MG/3ML SOLN Take 3 mLs by nebulization every 4 (four) hours as needed.     sertraline (ZOLOFT) 25 MG tablet Take 25 mg by mouth daily.     No current facility-administered medications on file prior to visit.    Review of Systems  Constitutional:  Negative for activity change, appetite change, fatigue, fever and unexpected weight change.  HENT:  Negative for congestion, ear pain, rhinorrhea, sinus pressure and sore throat.   Eyes:  Negative for pain, redness and visual disturbance.  Respiratory:  Positive for cough. Negative for shortness of breath and wheezing.   Cardiovascular:  Positive for leg swelling. Negative  for chest pain and palpitations.  Gastrointestinal:  Negative for abdominal pain, blood in stool, constipation and diarrhea.  Endocrine: Negative for polydipsia and polyuria.  Genitourinary:  Negative for dysuria, frequency and urgency.  Musculoskeletal:  Negative for arthralgias, back pain and myalgias.  Skin:  Negative for pallor and rash.  Allergic/Immunologic: Negative for environmental allergies.  Neurological:  Negative for dizziness, syncope and headaches.  Hematological:  Negative for adenopathy. Does not bruise/bleed easily.  Psychiatric/Behavioral:  Positive for decreased concentration. Negative for confusion and dysphoric mood. The patient is not nervous/anxious.        Brain fog Short term memory change        Objective:   Physical Exam Constitutional:      General: She is not in acute distress.    Appearance: Normal appearance. She is well-developed. She is obese. She is not ill-appearing or diaphoretic.     Comments: Wheelchair   HENT:     Head: Normocephalic and atraumatic.     Mouth/Throat:     Mouth: Mucous membranes are moist.     Pharynx: Oropharynx is clear. No oropharyngeal exudate or posterior oropharyngeal erythema.     Comments: No swelling  Eyes:     General: No scleral icterus.       Right eye: No discharge.        Left eye: No discharge.     Conjunctiva/sclera: Conjunctivae normal.     Pupils: Pupils are equal, round, and reactive to light.  Neck:     Thyroid: No thyromegaly.     Vascular: No carotid bruit or JVD.     Comments: Janina Mayo site clean  Cardiovascular:  Rate and Rhythm: Normal rate and regular rhythm.     Heart sounds: Normal heart sounds.     No gallop.  Pulmonary:     Effort: Pulmonary effort is normal. No respiratory distress.     Breath sounds: Normal breath sounds. No stridor. No wheezing, rhonchi or rales.  Chest:     Chest wall: No tenderness.  Abdominal:     General: There is no distension or abdominal bruit.      Palpations: Abdomen is soft. There is no mass.     Tenderness: There is no abdominal tenderness. There is no guarding or rebound.     Comments: PEG site unremarkable   Musculoskeletal:     Cervical back: Normal range of motion and neck supple.     Right lower leg: Edema present.     Left lower leg: Edema present.     Comments: One plus pedal edema   Lymphadenopathy:     Cervical: No cervical adenopathy.  Skin:    General: Skin is warm and dry.     Coloration: Skin is not pale.     Findings: No rash.  Neurological:     Mental Status: She is alert.     Cranial Nerves: No cranial nerve deficit.     Coordination: Coordination normal.     Deep Tendon Reflexes: Reflexes are normal and symmetric. Reflexes normal.     Comments: Generalized weakness   Psychiatric:        Mood and Affect: Mood normal.           Assessment & Plan:   Problem List Items Addressed This Visit       Cardiovascular and Mediastinum   Essential hypertension   BP: 137/75  Better on 2nd check  Now taking hydralazine 25 mg tid Tolerating it   This was changed in hosp  Does c/o swollen feet and ankles (can be side effects)  Lab today  Consider follow up with cardiology         Relevant Medications   hydrALAZINE (APRESOLINE) 25 MG tablet   EPINEPHrine 0.3 mg/0.3 mL IJ SOAJ injection   Other Relevant Orders   TSH (Completed)   Comprehensive metabolic panel (Completed)   CBC with Differential/Platelet (Completed)   Ambulatory referral to Cardiology   Chronic diastolic heart failure (HCC)   No longer on B blocker or thiazide diuretic due to angioedema Some ankle swelling Is on hydralazine  Pending labs will likely ref for cardiol follow up with Dr Mariah Milling (referral done)  Unable to take some medications due to alpha gal allergy/severe       Relevant Medications   hydrALAZINE (APRESOLINE) 25 MG tablet   EPINEPHrine 0.3 mg/0.3 mL IJ SOAJ injection   Other Relevant Orders   Ambulatory referral  to Cardiology     Digestive   GERD   Some increase in epigastrig and esohp burning  Has trach and G tube Takes pepcid 20 mg daily   Will get PEG removed tomorrow as planned If no improvement will consider increase ot bid   In future may need to return to ppi /protinx  Want to avoid if possible         Endocrine   Hypothyroidism   TSH today  Will see if we need to re start treatment  Feels fair  Did have traumatic fall with trach and PEG placement       Relevant Orders   TSH (Completed)     Genitourinary   E.  coli UTI (urinary tract infection)   Recent -culture noted 2 types of e coli from ER Reviewed hospital records, lab results and studies in detail   On cephalexin and gradually clinically improving Discussed importance of fluid intake  Instructed to call if symptoms return         Other   Vitamin B12 deficiency   Never had a chance to start oral B12   Shot today  Level today Arlys John fog noted/ this may play role   Alcohol intake is minimal now       Relevant Orders   Vitamin B12 (Completed)   Tracheostomy status (HCC)   Placed by ENT during hosp in oct for angioedema with resp distress  Reviewed hospital records, lab results and studies in detail  Decided to keep this in place until her allergy status is figured out  Was adjusted recently for fit  Tolerating well  Also has PEG tube   Hopes to get this out in the future after angioedema (alpha gal) is figured out further (new allergist appointment in march at [facility])      S/P percutaneous endoscopic gastrostomy (PEG) tube placement (HCC)   Due to have this removed tomorrow  C/o of some epigastric and esoph burning  Will see if this resolves Taking pepcid 20 mg daily       Prediabetes   A1c today   Diet affected by alpha gal / trach/PEG  disc imp of low glycemic diet and wt loss to prevent DM2       Relevant Orders   Hemoglobin A1c (Completed)   Pedal edema   Suspect due to hydralazine   Encouraged to elevate feet when sitting  Consider knee high support socks       Relevant Orders   Comprehensive metabolic panel (Completed)   CBC with Differential/Platelet (Completed)   Ambulatory referral to Cardiology   Obesity   Some weight loss after trach and PEG tube  Alpha gal limits diet       Current use of proton pump inhibitor   No longer on protonix  May consider if GERd symptoms do not improve after removal of PEG      Brain fog   Worse since hosp for hypoxia and angioedema  Had trach placed  Was traumatic experience/does not remember some of it (was on vent and sedated)   This is slowly improved with better 02 status and nutrition Will continue to watch  Consider neuro /further eval and treatment if needed        Angioedema - Primary   Hosp in oct  Reviewed hospital records, lab results and studies in detail  Trach and also PEG tube in place Recently home from rehab   Going to keep trach until she sees allergist at [facility] in march  Overall doing well Has alpha gal allergy Now also avoiding gelatin   Call back and Er precautions noted in detail today        Anemia   Chronic and normocytic in past  Worse around hosp in oct   Re check today   Pt has declined GI work up in past       Relevant Orders   CBC with Differential/Platelet (Completed)   Allergic reaction to alpha-gal   Hosp in oct  Janina Mayo was placed due to severe angioedema and resp distress Now avoiding all meat and also gelatin  Reviewed visit with pharmacist re: this   No recent episodes Pt has visit with allergist  in mid march at [facility]

## 2023-05-15 NOTE — Assessment & Plan Note (Addendum)
No longer on B blocker or thiazide diuretic due to angioedema Some ankle swelling Is on hydralazine  Pending labs will likely ref for cardiol follow up with Dr Mariah Milling (referral done)  Unable to take some medications due to alpha gal allergy/severe

## 2023-05-15 NOTE — Assessment & Plan Note (Signed)
Chronic and normocytic in past  Worse around hosp in oct   Re check today   Pt has declined GI work up in past

## 2023-05-15 NOTE — Assessment & Plan Note (Signed)
A1c today   Diet affected by alpha gal / trach/PEG  disc imp of low glycemic diet and wt loss to prevent DM2

## 2023-05-15 NOTE — Assessment & Plan Note (Signed)
Recent -culture noted 2 types of e coli from ER Reviewed hospital records, lab results and studies in detail   On cephalexin and gradually clinically improving Discussed importance of fluid intake  Instructed to call if symptoms return

## 2023-05-15 NOTE — Patient Instructions (Addendum)
Get the PEG tube out as planned  If the abdominal/esophageal burning does not improve - let us know and we will go up on pepcid   See allergist as planned    I want to get you a follow up with Dr Mariah Milling (lets get your labs first)   Avoid excessive alcohol  Hoping brain fog continues to improve If urinary symptoms worsen let me know   Labs today

## 2023-05-15 NOTE — Assessment & Plan Note (Signed)
No longer on protonix  May consider if GERd symptoms do not improve after removal of PEG

## 2023-05-15 NOTE — Assessment & Plan Note (Signed)
Placed by ENT during hosp in oct for angioedema with resp distress  Reviewed hospital records, lab results and studies in detail  Decided to keep this in place until her allergy status is figured out  Was adjusted recently for fit  Tolerating well  Also has PEG tube   Hopes to get this out in the future after angioedema (alpha gal) is figured out further (new allergist appointment in march at [facility])

## 2023-05-15 NOTE — Assessment & Plan Note (Signed)
Never had a chance to start oral B12   Shot today  Level today Arlys John fog noted/ this may play role   Alcohol intake is minimal now

## 2023-05-15 NOTE — Assessment & Plan Note (Signed)
Hosp in [place] was placed due to severe angioedema and resp distress Now avoiding all meat and also gelatin  Reviewed visit with pharmacist re: this   No recent episodes Pt has visit with allergist in mid march at [facility]

## 2023-05-15 NOTE — Assessment & Plan Note (Signed)
Suspect due to hydralazine  Encouraged to elevate feet when sitting  Consider knee high support socks

## 2023-05-15 NOTE — Assessment & Plan Note (Signed)
Some weight loss after trach and PEG tube  Alpha gal limits diet

## 2023-05-15 NOTE — Assessment & Plan Note (Signed)
Some increase in epigastrig and esohp burning  Has trach and G tube Takes pepcid 20 mg daily   Will get PEG removed tomorrow as planned If no improvement will consider increase ot bid   In future may need to return to ppi /protinx  Want to avoid if possible

## 2023-05-15 NOTE — Assessment & Plan Note (Signed)
Worse since hosp for hypoxia and angioedema  Had trach placed  Was traumatic experience/does not remember some of it (was on vent and sedated)   This is slowly improved with better 02 status and nutrition Will continue to watch  Consider neuro /further eval and treatment if needed

## 2023-05-15 NOTE — Assessment & Plan Note (Signed)
Due to have this removed tomorrow  C/o of some epigastric and esoph burning  Will see if this resolves Taking pepcid 20 mg daily

## 2023-05-15 NOTE — Assessment & Plan Note (Signed)
BP: 137/75  Better on 2nd check  Now taking hydralazine 25 mg tid Tolerating it   This was changed in hosp  Does c/o swollen feet and ankles (can be side effects)  Lab today  Consider follow up with cardiology

## 2023-05-16 DIAGNOSIS — L980 Pyogenic granuloma: Secondary | ICD-10-CM | POA: Diagnosis not present

## 2023-05-16 DIAGNOSIS — R1311 Dysphagia, oral phase: Secondary | ICD-10-CM | POA: Diagnosis not present

## 2023-05-16 DIAGNOSIS — M795 Residual foreign body in soft tissue: Secondary | ICD-10-CM | POA: Diagnosis not present

## 2023-05-16 DIAGNOSIS — Z9911 Dependence on respirator [ventilator] status: Secondary | ICD-10-CM | POA: Diagnosis not present

## 2023-05-16 DIAGNOSIS — T783XXS Angioneurotic edema, sequela: Secondary | ICD-10-CM | POA: Diagnosis not present

## 2023-05-21 NOTE — Telephone Encounter (Signed)
Please have her drop off urine sample for urinalysis and culture when able  Thanks Encourage water intake  Thanks for letting me know

## 2023-05-22 ENCOUNTER — Other Ambulatory Visit: Payer: Self-pay

## 2023-05-22 ENCOUNTER — Emergency Department
Admission: EM | Admit: 2023-05-22 | Discharge: 2023-05-22 | Disposition: A | Discharge status: Home / Self Care | Payer: Medicare Other | Attending: Emergency Medicine | Admitting: Emergency Medicine

## 2023-05-22 ENCOUNTER — Encounter: Payer: Self-pay | Admitting: Family Medicine

## 2023-05-22 ENCOUNTER — Other Ambulatory Visit (INDEPENDENT_AMBULATORY_CARE_PROVIDER_SITE_OTHER): Payer: Medicare Other

## 2023-05-22 DIAGNOSIS — K148 Other diseases of tongue: Secondary | ICD-10-CM | POA: Diagnosis present

## 2023-05-22 DIAGNOSIS — B9620 Unspecified Escherichia coli [E. coli] as the cause of diseases classified elsewhere: Secondary | ICD-10-CM | POA: Diagnosis not present

## 2023-05-22 DIAGNOSIS — R300 Dysuria: Secondary | ICD-10-CM

## 2023-05-22 DIAGNOSIS — T783XXA Angioneurotic edema, initial encounter: Secondary | ICD-10-CM | POA: Diagnosis not present

## 2023-05-22 DIAGNOSIS — N390 Urinary tract infection, site not specified: Secondary | ICD-10-CM | POA: Diagnosis not present

## 2023-05-22 HISTORY — DX: Heart failure, unspecified: I50.9

## 2023-05-22 LAB — CBC WITH DIFFERENTIAL/PLATELET
Abs Immature Granulocytes: 0.02 10*3/uL (ref 0.00–0.07)
Basophils Absolute: 0 10*3/uL (ref 0.0–0.1)
Basophils Relative: 0 %
Eosinophils Absolute: 0.3 10*3/uL (ref 0.0–0.5)
Eosinophils Relative: 4 %
HCT: 33.9 % — ABNORMAL LOW (ref 36.0–46.0)
Hemoglobin: 10.3 g/dL — ABNORMAL LOW (ref 12.0–15.0)
Immature Granulocytes: 0 %
Lymphocytes Relative: 36 %
Lymphs Abs: 2.7 10*3/uL (ref 0.7–4.0)
MCH: 25.9 pg — ABNORMAL LOW (ref 26.0–34.0)
MCHC: 30.4 g/dL (ref 30.0–36.0)
MCV: 85.2 fL (ref 80.0–100.0)
Monocytes Absolute: 0.4 10*3/uL (ref 0.1–1.0)
Monocytes Relative: 6 %
Neutro Abs: 3.9 10*3/uL (ref 1.7–7.7)
Neutrophils Relative %: 54 %
Platelets: 433 10*3/uL — ABNORMAL HIGH (ref 150–400)
RBC: 3.98 MIL/uL (ref 3.87–5.11)
RDW: 15.7 % — ABNORMAL HIGH (ref 11.5–15.5)
WBC: 7.3 10*3/uL (ref 4.0–10.5)
nRBC: 0 % (ref 0.0–0.2)

## 2023-05-22 LAB — BASIC METABOLIC PANEL
Anion gap: 15 (ref 5–15)
BUN: 11 mg/dL (ref 8–23)
CO2: 24 mmol/L (ref 22–32)
Calcium: 9.5 mg/dL (ref 8.9–10.3)
Chloride: 102 mmol/L (ref 98–111)
Creatinine, Ser: 0.9 mg/dL (ref 0.44–1.00)
GFR, Estimated: >60 mL/min (ref >60)
Glucose, Bld: 124 mg/dL — ABNORMAL HIGH (ref 70–99)
Potassium: 3.4 mmol/L — ABNORMAL LOW (ref 3.5–5.1)
Sodium: 141 mmol/L (ref 135–145)

## 2023-05-22 LAB — POC URINALSYSI DIPSTICK (AUTOMATED)
Bilirubin, UA: NEGATIVE
Blood, UA: NEGATIVE
Glucose, UA: NEGATIVE
Ketones, UA: NEGATIVE
Leukocytes, UA: NEGATIVE
Nitrite, UA: NEGATIVE
Protein, UA: NEGATIVE
Spec Grav, UA: >=1.03 — AB (ref 1.010–1.025)
Urobilinogen, UA: 0.2 U/dL
pH, UA: 6 (ref 5.0–8.0)

## 2023-05-22 MED ORDER — FAMOTIDINE IN NACL 20-0.9 MG/50ML-% IV SOLN
20.0000 mg | Freq: Once | INTRAVENOUS | Status: AC
  Administered 2023-05-22: 20 mg via INTRAVENOUS
  Filled 2023-05-22: qty 50

## 2023-05-22 MED ORDER — METHYLPREDNISOLONE SODIUM SUCC 125 MG IJ SOLR
125.0000 mg | Freq: Once | INTRAMUSCULAR | Status: AC
  Administered 2023-05-22: 125 mg via INTRAVENOUS
  Filled 2023-05-22: qty 2

## 2023-05-22 MED ORDER — EPINEPHRINE 0.3 MG/0.3ML IJ SOAJ
0.3000 mg | Freq: Once | INTRAMUSCULAR | Status: AC
  Administered 2023-05-22: 0.3 mg via INTRAMUSCULAR
  Filled 2023-05-22: qty 0.3

## 2023-05-22 NOTE — ED Notes (Signed)
First nurse is gathering meds an checking that room 41 is ready for pt.

## 2023-05-22 NOTE — Discharge Instructions (Signed)
Please follow-up with the allergist as planned.  Please return for any severe worsening symptoms.

## 2023-05-22 NOTE — Telephone Encounter (Signed)
Spoke with spouse and he will bring pt in for a urine sample today and will try to encourage water

## 2023-05-22 NOTE — ED Provider Notes (Signed)
[facility] Provider Note    Event Date/Time   First MD Initiated Contact with Patient 05/22/23 1633     (approximate)   History   Angioedema   HPI NAZARENE [name] is a 73 y.o. female with history of alpha gal syndrome, angioedema, chronic tracheostomy presenting today for tongue swelling.  Patient states noticing some swelling to her tongue.  She took a nap for 2 hours and when she awoke noticed the tongue was slightly more swollen.  Has had issues with angioedema in the past and took Benadryl.  Came in for further evaluation given the tongue swelling had not improved.  She denies any difficulty breathing, other facial swelling, difficulty swallowing.  No chest pain or wheezing.  Chart review: Patient has multiple ED visits for angioedema in the past.  Frequently able to be discharged after medical treatment and observation period.  Patient and her husband report this as a very mild case which is not worsening.  In triage, patient was ordered steroids and EpiPen.     Physical Exam   Triage Vital Signs: ED Triage Vitals  Encounter Vitals Group     BP 05/22/23 1617 (!) 187/105     Systolic BP Percentile --      Diastolic BP Percentile --      Pulse Rate 05/22/23 1617 (!) 105     Resp 05/22/23 1617 18     Temp 05/22/23 1617 (!) 96.8 F (36 C)     Temp Source 05/22/23 1617 Axillary     SpO2 05/22/23 1617 100 %     Weight 05/22/23 1620 207 lb (93.9 kg)     Height 05/22/23 1620 5\' 6"  (1.676 m)     Head Circumference --      Peak Flow --      Pain Score 05/22/23 1616 0     Pain Loc --      Pain Education --      Exclude from Growth Chart --     Most recent vital signs: Vitals:   05/22/23 1617  BP: (!) 187/105  Pulse: (!) 105  Resp: 18  Temp: (!) 96.8 F (36 C)  SpO2: 100%   I have reviewed the vital signs. General:  Awake, alert, no acute distress. Head:  Normocephalic, Atraumatic. EENT:  PERRL, EOMI, Oral mucosa pink and moist, Neck is  supple.  Slightly edematous tongue but not completely filling oral airway.  No stridor. Cardiovascular: Regular rate, 2+ distal pulses. Respiratory:  Normal respiratory effort, symmetrical expansion, no distress.  No wheezing present.  Normal work of breathing. Extremities:  Moving all four extremities through full ROM without pain.   Neuro:  Alert and oriented.  Interacting appropriately.   Skin:  Warm, dry, no rash.   Psych: Appropriate affect.    ED Results / Procedures / Treatments   Labs (all labs ordered are listed, but only abnormal results are displayed) Labs Reviewed  BASIC METABOLIC PANEL - Abnormal; Notable for the following components:      Result Value   Potassium 3.4 (*)    Glucose, Bld 124 (*)    All other components within normal limits  CBC WITH DIFFERENTIAL/PLATELET - Abnormal; Notable for the following components:   Hemoglobin 10.3 (*)    HCT 33.9 (*)    MCH 25.9 (*)    RDW 15.7 (*)    Platelets 433 (*)    All other components within normal limits     EKG  RADIOLOGY    PROCEDURES:  Critical Care performed: Yes, see critical care procedure note(s)  .Critical Care  Performed by: Janith Lima, MD Authorized by: Janith Lima, MD   Critical care provider statement:    Critical care time (minutes):  30   Critical care was necessary to treat or prevent imminent or life-threatening deterioration of the following conditions: Angioedema.   Critical care was time spent personally by me on the following activities:  Development of treatment plan with patient or surrogate, discussions with consultants, evaluation of patient's response to treatment, examination of patient, ordering and review of laboratory studies, ordering and review of radiographic studies, ordering and performing treatments and interventions, pulse oximetry, re-evaluation of patient's condition and review of old charts   I assumed direction of critical care for this patient from another  provider in my specialty: no      MEDICATIONS ORDERED IN ED: Medications  EPINEPHrine (EPI-PEN) injection 0.3 mg (0.3 mg Intramuscular Given 05/22/23 1634)  methylPREDNISolone sodium succinate (SOLU-MEDROL) 125 mg/2 mL injection 125 mg (125 mg Intravenous Given 05/22/23 1633)  famotidine (PEPCID) IVPB 20 mg premix (20 mg Intravenous New Bag/Given 05/22/23 1800)     IMPRESSION / MDM / ASSESSMENT AND PLAN / ED COURSE  I reviewed the triage vital signs and the nursing notes.                              Differential diagnosis includes, but is not limited to, angioedema, alpha gal syndrome  Patient's presentation is most consistent with acute presentation with potential threat to life or bodily function.  Patient is a 73 year old female presenting today for concerns of angioedema with her history of alpha gal syndrome.  Tongue appears slightly edematous with husband agrees with.  No stridor, wheezing, or difficulty breathing at this time.  Given her history, Solu-Medrol and epinephrine was ordered while in the waiting room.  Vital signs otherwise appear stable especially from a respiratory standpoint.  Patient feeling well and near her baseline right now.  Will monitor in the ED for a couple hours to ensure no worsening of symptoms.  Patient was observed in the ED for over 3 hours with complete resolution of symptoms.  No continued tongue swelling or difficulty breathing.  Feels completely back to her baseline and wishes to go home at this time.  Many similar events in the past with no recurrence immediately in the same day.  Feel comfortable discharging her at this time and have her follow-up with her allergist as planned.  Given strict return precautions.  The patient is on the cardiac monitor to evaluate for evidence of arrhythmia and/or significant heart rate changes. Clinical Course as of 05/22/23 1909  Tue May 22, 2023  1908 Patient feeling significantly better.  No continued tongue  swelling.  No difficulty breathing.  Feels like she is completely back at her baseline and wishes to go home at this time.  She has been observed for over 3 hours with no worsening symptoms [DW]    Clinical Course User Index [DW] Janith Lima, MD     FINAL CLINICAL IMPRESSION(S) / ED DIAGNOSES   Final diagnoses:  Angioedema, initial encounter     Rx / DC Orders   ED Discharge Orders     None        Note:  This document was prepared using Dragon voice recognition software and may include unintentional dictation errors.  Janith Lima, MD 05/22/23 1910

## 2023-05-22 NOTE — ED Triage Notes (Signed)
Pt to ED from home with husband, hx alpha gal, unsure what could have eaten but began having R tongue swelling 2 hours ago. Took nap, woke up with swelling. Pt has trach. Able to speak, denies feeling any obstruction to airway. Voice is slightly hoarse. Listened to neck, no stridor heard. Lips not swollen.  Pt had 45mg  PO benadryl about 30 min ago. Called EDP Siadecki for orders.

## 2023-05-22 NOTE — ED Notes (Signed)
EDP at bedside. Pt placed on monitor.

## 2023-05-23 ENCOUNTER — Telehealth: Payer: Self-pay

## 2023-05-23 LAB — URINE CULTURE
MICRO NUMBER:: 16008116
SPECIMEN QUALITY:: ADEQUATE

## 2023-05-23 NOTE — Transitions of Care (Post Inpatient/ED Visit) (Signed)
   05/23/2023  Name: Sharon [name] MRN: [id number] DOB: [birth year]  Today's TOC FU Call Status: Today's TOC FU Call Status:: Successful TOC FU Call Completed TOC FU Call Complete Date: 05/23/23 Patient's Name and Date of Birth confirmed.  Transition Care Management Follow-up Telephone Call Date of Discharge: 05/22/23 Discharge Facility: [facility]) Type of Discharge: Emergency Department Reason for ED Visit: Other: (edema) How have you been since you were released from the hospital?: Better Any questions or concerns?: No  Items Reviewed: Did you receive and understand the discharge instructions provided?: Yes Medications obtained,verified, and reconciled?: Yes (Medications Reviewed) Any new allergies since your discharge?: No Dietary orders reviewed?: Yes Do you have support at home?: Yes People in Home: spouse  Medications Reviewed Today: Medications Reviewed Today     Reviewed by Karena Addison, LPN (Licensed Practical Nurse) on 05/23/23 at 1143  Med List Status: <None>   Medication Order Taking? Sig Documenting Provider Last Dose Status Informant  EPINEPHrine 0.3 mg/0.3 mL IJ SOAJ injection [id number] No Inject 0.3 mg into the muscle as needed. [provider] Taking Active   famotidine (PEPCID) 20 MG tablet [id number] No Place 1 tablet (20 mg total) into feeding tube daily. Erin Fulling, MD Taking Active   hydrALAZINE (APRESOLINE) 25 MG tablet [id number] No Take 25 mg by mouth every 8 (eight) hours. [provider] Taking Active   ipratropium-albuterol (DUONEB) 0.5-2.5 (3) MG/3ML SOLN [id number] No Take 3 mLs by nebulization every 4 (four) hours as needed. Erin Fulling, MD Taking Active   sertraline (ZOLOFT) 25 MG tablet [id number] No Take 25 mg by mouth daily. [provider] Taking Active             Home Care and Equipment/Supplies: Were Home Health Services Ordered?: NA Any new equipment or medical supplies  ordered?: NA  Functional Questionnaire: Do you need assistance with bathing/showering or dressing?: No Do you need assistance with meal preparation?: No Do you need assistance with eating?: No Do you have difficulty maintaining continence: No Do you need assistance with getting out of bed/getting out of a chair/moving?: No Do you have difficulty managing or taking your medications?: No  Follow up appointments reviewed: PCP Follow-up appointment confirmed?: NA Specialist Hospital Follow-up appointment confirmed?: Yes Date of Specialist follow-up appointment?: 06/29/23 Follow-Up Specialty Provider:: [facility] Do you need transportation to your follow-up appointment?: No Do you understand care options if your condition(s) worsen?: Yes-patient verbalized understanding    SIGNATURE Karena Addison, LPN [facility] Nurse Health Advisor Direct Dial [phone]

## 2023-05-24 ENCOUNTER — Telehealth: Payer: Self-pay

## 2023-05-24 NOTE — Telephone Encounter (Signed)
Called patient, she requested an appointment due to rapid heart beat. They have her scheduled for February 3rd, LVM for patient to call back to discuss further.   Left call back number.

## 2023-05-25 NOTE — Telephone Encounter (Signed)
 Sent mychart message

## 2023-05-25 NOTE — Progress Notes (Unsigned)
Cardiology Clinic Note   Date: 05/28/2023 ID: Kit, Brubacher 1951-02-23, MRN [id number]  Primary Cardiologist:  Julien Nordmann, MD  Chief Complaint   Sharon [name] is a 73 y.o. female who presents to the clinic today for evaluation of tachycardia.   Patient Profile   Sharon [name] is followed by Dr. Mariah Milling for the history outlined below.      Past medical history significant for: Palpitations/SVT. 14-day ZIO 01/09/2022: HR 52 to 193 bpm, average 81 bpm.  82 runs of SVT fastest 9 beats max rate 193 bpm, longest 19 beats average rate 157 bpm.  Rare ectopy. PAF.  Chronic diastolic heart failure. Echo 01/12/2022: EF 60 to 65%.  No RWMA.  Grade I DD.  Normal RV size/function.  Aortic valve sclerosis without stenosis. Hypertension. Hyperlipidemia. Lipid panel 01/02/2023: LDL 46, HDL 61, TG 241, total 144. GERD. Hypothyroidism. Angioedema.  In summary, patient was first evaluated by Dr. Mariah Milling on 09/21/2020 for lightheadedness and presyncope at the request of Dr. Milinda Antis.  Patient reported history of mitral valve prolapse in the 1980s.  She reported episodes of gait instability and lightheadedness.  No documented orthostasis.  It was felt her symptoms were due to general deconditioning gait instability.  Cardiac testing was deferred in favor of beginning a regular walking program.  She was seen in follow-up August 2023 for complaint of palpitations.  She wore a 14-day ZIO which showed a 2 runs of SVT as detailed above.  Echo showed normal LV/RV function as detailed above.  She was started on Lopressor and referred to EP.  Patient was last seen in the office by Dr. Mariah Milling on 05/08/2022 for 42-month follow-up.  She continued to complain of lightheadedness and neurology referral was recommended.  Patient's husband reported lightheadedness worse after intubation with general anesthesia for 4 days.  She did not want to use cane for stability.  Walking program and physical therapy for gait  stability was recommended.  Zetia was added for hyperlipidemia.  Patient underwent hospital admission to [facility] from 02/01/2023 to 02/09/2023 for recurrent angioedema.  She reported gradual tongue swelling and shortness of breath unresponsive to IM epinephrine.  She was taken emergently to the OR for transnasal endotracheal intubation.  She underwent tracheostomy on 02/06/2023.  She was discharged to [facility] on 02/09/2023.  Patient was evaluated in the ED on 05/12/2023 for trach tube dislodgment.  Respiratory therapy cleaned and adjusted tracheostomy with improvement in symptoms.  She was treated for a UTI and discharged follow-up with PCP.  Patient presented to [facility] ED on 05/22/2023 with tongue swelling.  She was treated with steroids and epinephrine with complete resolution of symptoms.  She was discharged home the same day.     History of Present Illness    Today, patient is accompanied by her husband. She reports palpitations described as heart racing and skipping. This has been an issue for her for years but she is concerned about it, as she read in her chart she has heart failure. She reports her heart rate stays elevated most of the time running >100 bpm with dips into the 90s. She finds it most bothersome at night when she is trying to sleep. She has associated dyspnea with it stating "I have learned to kind of breathe with it." While she was in the hospital she had a run of afib with controlled rate. She was not started on anticoagulation at that time and metoprolol was not restarted at discharge. She reports BP has  been elevated at home with SBP consistently in the 140s. She denies chest pain, pressure or tightness. She has lower extremity edema but no orthopnea or PND.     ROS: All other systems reviewed and are otherwise negative except as noted in History of Present Illness.  EKGs/Labs Reviewed    EKG Interpretation Date/Time:  Monday May 28 2023 10:02:33 EST Ventricular Rate:   101 PR Interval:  158 QRS Duration:  70 QT Interval:  356 QTC Calculation: 461 R Axis:   17  Text Interpretation: Sinus tachycardia with occasional Premature ventricular complexes Possible Inferior infarct , age undetermined When compared with ECG of 02-Feb-2023 14:44, Sinus rhythm has replaced Atrial fibrillation Borderline criteria for Inferior infarct are now Present ST no longer depressed in Anterior leads Confirmed by Carlos Levering [phone]) on 05/28/2023 10:10:18 AM   05/15/2023: ALT 12; AST 22 05/22/2023: BUN 11; Creatinine, Ser 0.90; Potassium 3.4; Sodium 141   05/22/2023: Hemoglobin 10.3; WBC 7.3   05/15/2023: TSH 2.39    Risk Assessment/Calculations     CHA2DS2-VASc Score = 4   This indicates a 4.8% annual risk of stroke. The patient's score is based upon: CHF History: 1 HTN History: 1 Diabetes History: 0 Stroke History: 0 Vascular Disease History: 0 Age Score: 1 Gender Score: 1    HYPERTENSION CONTROL Vitals:   05/28/23 0956 05/28/23 1233  BP: (!) 146/80 (!) 150/80    The patient's blood pressure is elevated above target today.  In order to address the patient's elevated BP: A new medication was prescribed today.           Physical Exam    VS:  BP (!) 150/80 (BP Location: Left Arm, Patient Position: Sitting, Cuff Size: Normal)   Pulse (!) 101   Wt 206 lb 12.8 oz (93.8 kg)   SpO2 98%   BMI 33.38 kg/m  , BMI Body mass index is 33.38 kg/m.  GEN: Well nourished, well developed, in no acute distress. Neck: No JVD or carotid bruits. Cardiac:  RRR. No murmurs. No rubs or gallops.   Respiratory:  Respirations regular and unlabored. Clear to auscultation without rales, wheezing or rhonchi. GI: Soft, nontender, nondistended. Extremities: Radials/DP/PT 2+ and equal bilaterally. No clubbing or cyanosis.  1+ pitting edema bilateral lower extremities. Skin: Warm and dry, no rash. Neuro: Strength intact.  Assessment & Plan   Palpitations/SVT/PAF 14-day ZIO  September 2023 showed 82 runs of SVT fastest 9 beats rate 193 bpm, longest 19 beats average rate 157 bpm.  Patient reports palpitations described as heart racing and skipping most bothersome at night. She is unsure if this feels the same as the palpitations she was experiencing in September. She had a run of afib documented on EKG during hospital admission in October. She was not started on anticoagulation and metoprolol was not restarted upon discharge.  Patient has a pending sleep study.  CHA2DS2-VASc 4.  EKG today shows sinus tachycardia with occasional PVCs. -Start Eliquis 5 mg twice daily and carvedilol 3.125 mg twice daily. -14-day ZIO for further evaluation.  Chronic diastolic heart failure Echo September 2023 showed normal LV/RV function, Grade I DD, no significant valvular abnormalities.  Patient reports lower extremity edema.  No orthopnea or PND.  She does have some associated dyspnea with palpitations.  1+ pitting edema bilateral lower extremities otherwise euvolemic and well compensated on exam. -Start carvedilol 3.125 mg twice daily. -Continue hydralazine. -Updated echo.  Hypertension BP today 146/80 on intake and 150/80 on my recheck.  Patient  reports lightheadedness and feeling of brain fog. -Continue hydralazine. -Add carvedilol as above.  Disposition: Eliquis 5 mg twice daily, carvedilol 3.125 mg twice daily.  Echo.  14-day ZIO.  Return in 6 to 8 weeks or sooner as needed.         Signed, Etta Grandchild. Courtlynn Holloman, DNP, NP-C

## 2023-05-28 ENCOUNTER — Ambulatory Visit: Payer: Medicare Other | Attending: Student | Admitting: Student

## 2023-05-28 ENCOUNTER — Ambulatory Visit: Payer: Medicare Other

## 2023-05-28 ENCOUNTER — Other Ambulatory Visit: Payer: Self-pay

## 2023-05-28 ENCOUNTER — Encounter: Payer: Self-pay | Admitting: Student

## 2023-05-28 VITALS — BP 150/80 | HR 101 | Wt 206.8 lb

## 2023-05-28 DIAGNOSIS — I480 Paroxysmal atrial fibrillation: Secondary | ICD-10-CM

## 2023-05-28 DIAGNOSIS — I110 Hypertensive heart disease with heart failure: Secondary | ICD-10-CM | POA: Insufficient documentation

## 2023-05-28 DIAGNOSIS — Z7901 Long term (current) use of anticoagulants: Secondary | ICD-10-CM | POA: Insufficient documentation

## 2023-05-28 DIAGNOSIS — I471 Supraventricular tachycardia, unspecified: Secondary | ICD-10-CM | POA: Diagnosis not present

## 2023-05-28 DIAGNOSIS — I509 Heart failure, unspecified: Secondary | ICD-10-CM | POA: Insufficient documentation

## 2023-05-28 DIAGNOSIS — I5032 Chronic diastolic (congestive) heart failure: Secondary | ICD-10-CM

## 2023-05-28 DIAGNOSIS — K148 Other diseases of tongue: Secondary | ICD-10-CM | POA: Diagnosis present

## 2023-05-28 DIAGNOSIS — R002 Palpitations: Secondary | ICD-10-CM | POA: Diagnosis not present

## 2023-05-28 DIAGNOSIS — I10 Essential (primary) hypertension: Secondary | ICD-10-CM | POA: Diagnosis not present

## 2023-05-28 DIAGNOSIS — T783XXA Angioneurotic edema, initial encounter: Secondary | ICD-10-CM | POA: Diagnosis not present

## 2023-05-28 DIAGNOSIS — E039 Hypothyroidism, unspecified: Secondary | ICD-10-CM | POA: Diagnosis not present

## 2023-05-28 MED ORDER — APIXABAN 5 MG PO TABS: 5.0000 mg | ORAL_TABLET | Freq: Two times a day (BID) | ORAL | Status: DC

## 2023-05-28 MED ORDER — CARVEDILOL 3.125 MG PO TABS: 3.1250 mg | ORAL_TABLET | Freq: Two times a day (BID) | ORAL | 3 refills | Status: DC

## 2023-05-28 MED ORDER — APIXABAN 5 MG PO TABS: 5.0000 mg | ORAL_TABLET | Freq: Two times a day (BID) | ORAL | 6 refills | Status: DC

## 2023-05-28 NOTE — Patient Instructions (Addendum)
Medication Instructions:  - Your physician has recommended you make the following change in your medication:   1) START Coreg (Carvedilol) 3.125 mg - take 1 tablet by mouth twice daily (or every 12 hours)    2) START Eliquis 5 mg: - take 1 tablet by mouth twice daily (or every 12 hours)   Samples Given: Eliquis 5 mg Lot: [id number] Exp: 10/23/23 # 2 boxes  (& 30 day free trial card)   *If you need a refill on your cardiac medications before your next appointment, please call your pharmacy*   Lab Work: - none ordered  If you have labs (blood work) drawn today and your tests are completely normal, you will receive your results only by: MyChart Message (if you have MyChart) OR A paper copy in the mail If you have any lab test that is abnormal or we need to change your treatment, we will call you to review the results.   Testing/Procedures:  1) Echocardiogram: - Your physician has requested that you have an echocardiogram. Echocardiography is a painless test that uses sound waves to create images of your heart. It provides your doctor with information about the size and shape of your heart and how well your heart's chambers and valves are working. This procedure takes approximately one hour. There are no restrictions for this procedure. Please do NOT wear cologne, perfume, aftershave, or lotions (deodorant is allowed). Please arrive 15 minutes prior to your appointment time.   There is a possibility that an IV may need to be started during your test to inject an image enhancing agent. This is done to obtain more optimal pictures of your heart. Therefore we ask that you do at least drink some water prior to coming in to hydrate your veins.    Please note: We ask at that you not bring children with you during ultrasound (echo/ vascular) testing. Due to room size and safety concerns, children are not allowed in the ultrasound rooms during exams. Our front office staff cannot provide  observation of children in our lobby area while testing is being conducted. An adult accompanying a patient to their appointment will only be allowed in the ultrasound room at the discretion of the ultrasound technician under special circumstances. We apologize for any inconvenience.   2) Heart Monitor:  Your physician has requested you wear a ZIO XT (heart) monitor for 14  days.  Your monitor will be mailed to your home address within 3-5 business days. This is sent via [employer] from [employer]. However, if you have not received your monitor after 5 business days please send [place] a MyChart message or call the office at [phone], so we may follow up on this for you.   This monitor is a medical device (single patch monitor) that records the heart's electrical activity. Doctors most often use these monitors to diagnose arrhythmias. Arrhythmias are problems with the speed or rhythm of the heartbeat.   [employer] supplies 1 patch per enrollment. Additional stickers are not available.  Please DO NOT apply the patch if you will be having a Nuclear Stress Test, Echocardiogram, Cardiac CT, Cardiac MRI, Chest X-ray during the period you would be wearing the monitor. The patch cannot be worn during these tests.  You cannot remove and re-apply the ZIO patch monitor.   Applying the Monitor: Once you receive your monitor, this will include a small razor, abrader, and 4 alcohol pads. Shave hair from upper left chest Rub abrader disc in  40 strokes over the left upper chest as indicated in your monitor instructions Clean area with 4 enclosed alcohol pads (there may be a mild & brief stinging sensation over the newly abraded area, but this is normal). Let dry Apply patch as indicated in monitor instructions. Patch will be placed under collarbone on the left side of the chest with arrow pointing upward. Rub adhesive wings for 2 minutes. Remove white label marked "1". Remove the white label marked "2".  Rub patch adhesive wings for an 2 minutes.  While looking in a mirror, press and release button in the center of the patch. You may hear a "click". A small green light will flash 4-6 times and then stop. This will be your indicator that the monitor has been turned on.  Wearing the Monitor: Avoid showering during the first 24 hours of wearing the monitor.  After 24 hours you may shower with the patch on. Take brief showers with your back facing the shower head.  Avoid excessive sweating to help maximize wear time. Do not submerge the device, no hot tubs, and no swimming pools. Keep any lotions or oils away from the patch. Press the button if you feel a symptom. You will hear a small click. Record date, time, and symptoms in the Patient Logbook or App.  Monitor Issues: Call [employer] Customer Care at [phone] if you have questions regarding your Zio Patch Monitor. Call them immediately if you see an orange/ amber colored light blinking on your monitor. If your monitor falls off and you cannot get this reapplied or if you need suggestions for securing your monitor call [employer] at [phone].   Returning the Monitor: Once you have completed wearing your monitor, follow instructions on the last 2 pages of the Patient Logbook. Stick monitor patch on to the last page of the Patient Logbook.  Place Patient Logbook with monitor in the return box provided. Use locking tab on box and tape box closed securely. The return box has pre-paid postage on it.  Place the return box in the regular [place] Mail box as soon as possible It will take anywhere from 1-2 weeks for your provider to receive and review your results once you mail this back. If for some reason you have misplaced your return box then call our office and we can provide another box and/or mail it off for you.   Billing  and Patient Assistance Program Information: We have supplied [employer] with any of your insurance information  on file for billing purposes. [employer] offers a sliding scale Patient Assistance Program for patients that do not have insurance, or whose insurance does not completely cover the cost of the ZIO monitor. You must apply for the Patient Assistance Program to qualify for this discounted rate. To apply, please call [employer] at [phone], select option 1, ask to apply for the Patient Assistance Program. [employer] will ask your household income, and how many people are in your household. They will quote your out-of-pocket cost based on that information. [employer] will also be able to set up for a 26-month, interest-free payment plan if needed.     Follow-Up: At [facility], you and your health needs are our priority.  As part of our continuing mission to provide you with exceptional heart care, we have created designated Provider Care Teams.  These Care Teams include your primary Cardiologist (physician) and Advanced Practice Providers (APPs -  Physician Assistants and Nurse Practitioners) who all work together to provide you  with the care you need, when you need it.  We recommend signing up for the patient portal called "MyChart".  Sign up information is provided on this After Visit Summary.  MyChart is used to connect with patients for Virtual Visits (Telemedicine).  Patients are able to view lab/test results, encounter notes, upcoming appointments, etc.  Non-urgent messages can be sent to your provider as well.   To learn more about what you can do with MyChart, go to [email or web address].    Your next appointment:   6-8 week(s)  Provider:   You may see Julien Nordmann, MD or one of the following Advanced Practice Providers on your designated Care Team:    Carlos Levering, NP    Other Instructions  Carvedilol Tablets What is this medication? CARVEDILOL (KAR ve dil ol) treats high blood pressure and heart failure. It may also be used to prevent further damage after a heart attack. It  works by lowering your blood pressure and heart rate, making it easier for your heart to pump blood to the rest of your body. It belongs to a group of medications called beta blockers. This medicine may be used for other purposes; ask your health care provider or pharmacist if you have questions. COMMON BRAND NAME(S): Coreg What should I tell my care team before I take this medication? They need to know if you have any of these conditions: Circulation problems Diabetes History of heart attack or heart disease Liver disease Lung or breathing disease, such as asthma Pheochromocytoma Slow or irregular heartbeat Thyroid disease An unusual or allergic reaction to carvedilol, other medications, foods, dyes, or preservatives Pregnant or trying to get pregnant Breastfeeding How should I use this medication? Take this medication by mouth. Take it as directed on the prescription label at the same time every day. Take it with food. Keep taking it unless your care team tells you to stop. Talk to your care team about the use of this medication in children. Special care may be needed. Overdosage: If you think you have taken too much of this medicine contact a poison control center or emergency room at once. NOTE: This medicine is only for you. Do not share this medicine with others. What if I miss a dose? If you miss a dose, take it as soon as you can. If it is almost time for your next dose, take only that dose. Do not take double or extra doses. What may interact with this medication? This medication may interact with the following: Certain medications for blood pressure, heart disease, irregular heartbeat Certain medications for depression, such as fluoxetine or paroxetine Certain medications for diabetes, such as glipizide or glyburide Cimetidine Clonidine Cyclosporine Digoxin MAOIs, such as Carbex, Eldepryl, Marplan, Nardil, and Parnate Reserpine Rifampin This list may not describe all  possible interactions. Give your health care provider a list of all the medicines, herbs, non-prescription drugs, or dietary supplements you use. Also tell them if you smoke, drink alcohol, or use illegal drugs. Some items may interact with your medicine. What should I watch for while using this medication? Visit your care team for regular checks on your progress. Check your blood pressure as directed. Know what your blood pressure should be and when to contact your care team. This medication may affect your coordination, reaction time, or judgment. Do not drive or operate machinery until you know how this medication affects you. Sit up or stand slowly to reduce the risk of dizzy or fainting spells. Drinking alcohol with  this medication can increase the risk of these side effects. Do not suddenly stop taking this medication. This may increase your risk of side effects, such as chest pain and heart attack. If you no longer need to take this medication, your care team will lower the dose slowly over time to decrease the risk of side effects. If you are going to need surgery or a procedure, tell your care team that you are using this medication. This medication may affect blood glucose levels. It can also mask the symptoms of low blood sugar, such as a rapid heartbeat and tremors. If you have diabetes, it is important to check your blood sugar often while you are taking this medication. Do not treat yourself for coughs, colds, or pain while you are using this medication without asking your care team for advice. Some medications may increase your blood pressure. What side effects may I notice from receiving this medication? Side effects that you should report to your care team as soon as possible: Allergic reactions--skin rash, itching, hives, swelling of the face, lips, tongue, or throat Heart failure--shortness of breath, swelling of the ankles, feet, or hands, sudden weight gain, unusual weakness or  fatigue Low blood pressure--dizziness, feeling faint or lightheaded, blurry vision Raynaud's--cool, numb, or painful fingers or toes that may change color from pale, to blue, to red Slow heartbeat--dizziness, feeling faint or lightheaded, confusion, trouble breathing, unusual weakness or fatigue Worsening mood, feelings of depression Side effects that usually do not require medical attention (report to your care team if they continue or are bothersome): Change in sex drive or performance Diarrhea Dizziness Fatigue Headache This list may not describe all possible side effects. Call your doctor for medical advice about side effects. You may report side effects to FDA at 1-800-FDA-1088. Where should I keep my medication? Keep out of the reach of children and pets. Store at room temperature between 20 and 25 degrees C (68 and 77 degrees F). Protect from moisture. Keep the container tightly closed. Throw away any unused medication after the expiration date. NOTE: This sheet is a summary. It may not cover all possible information. If you have questions about this medicine, talk to your doctor, pharmacist, or health care provider.  2024 Elsevier/Gold Standard (2022-04-10 00:00:00)    Apixaban Tablets What is this medication? APIXABAN (a PIX a ban) prevents and treats blood clots. It is also used to lower the risk of stroke in people with AFib (atrial fibrillation). It belongs to a group of medications called blood thinners. This medicine may be used for other purposes; ask your health care provider or pharmacist if you have questions. COMMON BRAND NAME(S): Eliquis What should I tell my care team before I take this medication? They need to know if you have any of these conditions: Antiphospholipid antibody syndrome Bleeding disorder History of bleeding in the brain History of blood clots History of stomach bleeding Kidney disease Liver disease Mechanical heart valve Spinal surgery An  unusual or allergic reaction to apixaban, other medications, foods, dyes, or preservatives Pregnant or trying to get pregnant Breastfeeding How should I use this medication? Take this medication by mouth. For your therapy to work as well as possible, take each dose exactly as prescribed on the prescription label. Do not skip doses. Skipping doses or stopping this medication can increase your risk of a blood clot or stroke. Keep taking this medication unless your care team tells you to stop. Take it as directed on the prescription label at  the same time every day. You can take it with or without food. If it upsets your stomach, take it with food. A special MedGuide will be given to you by the pharmacist with each prescription and refill. Be sure to read this information carefully each time. Talk to your care team about the use of this medication in children. Special care may be needed. Overdosage: If you think you have taken too much of this medicine contact a poison control center or emergency room at once. NOTE: This medicine is only for you. Do not share this medicine with others. What if I miss a dose? If you miss a dose, take it as soon as you can. If it is almost time for your next dose, take only that dose. Do not take double or extra doses. What may interact with this medication? Aspirin and aspirin-like medications Certain medications for fungal infections, such as itraconazole or ketoconazole Certain medications for seizures, such as carbamazepine or phenytoin Certain medications that prevent or treat blood clots, such as enoxaparin, dalteparin, heparin, warfarin Clarithromycin NSAIDs, medications for pain and inflammation, such as ibuprofen or naproxen Rifampin Ritonavir St. John's wort This list may not describe all possible interactions. Give your health care provider a list of all the medicines, herbs, non-prescription drugs, or dietary supplements you use. Also tell them if you  smoke, drink alcohol, or use illegal drugs. Some items may interact with your medicine. What should I watch for while using this medication? Visit your care team for regular checks on your progress. Your condition will be monitored carefully while you are receiving this medication. You may need blood work while taking this medication. Avoid sports and activities that might cause injury while you are using this medication. Severe falls or injuries can cause unseen bleeding. Be careful when using sharp tools or knives. Consider using an Neurosurgeon. Take special care brushing or flossing your teeth. Report any injuries, bruising, or red spots on the skin to your care team. If you are going to need surgery or other procedure, tell your care team that you are taking this medication. Wear a medical ID bracelet or chain. Carry a card that describes your condition. List the medications and doses you take on the card. What side effects may I notice from receiving this medication? Side effects that you should report to your care team as soon as possible: Allergic reactions--skin rash, itching, hives, swelling of the face, lips, tongue, or throat Bleeding--bloody or black, tar-like stools, vomiting blood or brown material that looks like coffee grounds, red or dark brown urine, small red or purple spots on the skin, unusual bruising or bleeding Bleeding in the brain--severe headache, stiff neck, confusion, dizziness, change in vision, numbness or weakness of the face, arm, or leg, trouble speaking, trouble walking, vomiting Heavy periods This list may not describe all possible side effects. Call your doctor for medical advice about side effects. You may report side effects to FDA at 1-800-FDA-1088. Where should I keep my medication? Keep out of the reach of children and pets. Store at room temperature between 20 and 25 degrees C (68 and 77 degrees F). Get rid of any unused medication after the expiration  date. To get rid of medications that are no longer needed or expired: Take the medication to a medication take-back program. Check with your pharmacy or law enforcement to find a location. If you cannot return the medication, check the label or package insert to see if the medication  should be thrown out in the garbage or flushed down the toilet. If you are not sure, ask your care team. If it is safe to put in the trash, empty the medication out of the container. Mix the medication with cat litter, dirt, coffee grounds, or other unwanted substance. Seal the mixture in a bag or container. Put it in the trash. NOTE: This sheet is a summary. It may not cover all possible information. If you have questions about this medicine, talk to your doctor, pharmacist, or health care provider.  2024 Elsevier/Gold Standard (2022-01-09 00:00:00)     Echocardiogram An echocardiogram is a test that uses sound waves to make images of your heart. This way of making images is often called ultrasound. The images from this test can help find out many things about your heart, including: The size and shape of your heart. The strength of your heart muscle and how well it's working. The size, thickness, and movement of your heart's walls. How your heart valves are working. Problems such as: A tumor or a growth from an infection around the heart valves. Areas of heart muscle that aren't working well because of poor blood flow or injury from a heart attack. An aneurysm. This is a weak or damaged part of an artery wall. An artery is a blood vessel. Tell a health care provider about: Any allergies you have. All medicines you're taking, including vitamins, herbs, eye drops, creams, and over-the-counter medicines. Any bleeding problems you have. Any surgeries you've had. Any medical problems you have. Whether you're pregnant or may be pregnant. What are the risks? Your health care provider will talk with you about risks.  These may include an allergic reaction to IV dye that may be used during the test. What happens before the test? You don't need to do anything to get ready for this test. You may eat and drink normally. What happens during the test?  You'll take off your clothes from the waist up and put on a hospital gown. Sticky patches called electrodes may be placed on your chest. These will be connected to a machine that monitors your heart rate and rhythm. You'll lie down on a table for the exam. A wand covered in gel will be moved over your chest. Sound waves from the wand will go to your heart and bounce back--or "echo" back. The sound waves will go to a computer that uses them to make images of your heart. The images can be viewed on a monitor. The images will also be recorded on the computer so your provider can look at them later. You may be asked to change positions or hold your breath for a short time. This makes it easier to get different views or better views of your heart. In some cases, you may be given a dye through an IV. The IV is put into one of your veins. This dye can make the areas of your heart easier to see. The procedure may vary among providers and hospitals. What can I expect after the test? You may return to your normal diet, activities, and medicines unless your provider tells you not to. If an IV was placed for the test, it will be removed. It's up to you to get the results of your test. Ask your provider, or the department that's doing the test, when your results will be ready. This information is not intended to replace advice given to you by your health care provider. Make sure you  discuss any questions you have with your health care provider. Document Revised: 06/09/2022 Document Reviewed: 06/09/2022 Elsevier Patient Education  2024 [employer].

## 2023-05-28 NOTE — ED Triage Notes (Signed)
Pt arrives via POV with CC of tongue swelling and diarrhea that started prior to arrival. Pt started Coreg and Eliquis today. Recent hx of same. Speech sounds slightly different than normal due to tongue swelling.

## 2023-05-29 ENCOUNTER — Emergency Department
Admission: EM | Admit: 2023-05-29 | Discharge: 2023-05-29 | Disposition: A | Discharge status: Home / Self Care | Payer: Medicare Other | Attending: Emergency Medicine | Admitting: Emergency Medicine

## 2023-05-29 DIAGNOSIS — T783XXA Angioneurotic edema, initial encounter: Secondary | ICD-10-CM

## 2023-05-29 LAB — BASIC METABOLIC PANEL
Anion gap: 12 (ref 5–15)
BUN: 11 mg/dL (ref 8–23)
CO2: 22 mmol/L (ref 22–32)
Calcium: 9.9 mg/dL (ref 8.9–10.3)
Chloride: 103 mmol/L (ref 98–111)
Creatinine, Ser: 0.92 mg/dL (ref 0.44–1.00)
GFR, Estimated: >60 mL/min (ref >60)
Glucose, Bld: 138 mg/dL — ABNORMAL HIGH (ref 70–99)
Potassium: 3.8 mmol/L (ref 3.5–5.1)
Sodium: 137 mmol/L (ref 135–145)

## 2023-05-29 LAB — CBC WITH DIFFERENTIAL/PLATELET
Abs Immature Granulocytes: 0.02 10*3/uL (ref 0.00–0.07)
Basophils Absolute: 0 10*3/uL (ref 0.0–0.1)
Basophils Relative: 0 %
Eosinophils Absolute: 0.4 10*3/uL (ref 0.0–0.5)
Eosinophils Relative: 5 %
HCT: 33.6 % — ABNORMAL LOW (ref 36.0–46.0)
Hemoglobin: 10.2 g/dL — ABNORMAL LOW (ref 12.0–15.0)
Immature Granulocytes: 0 %
Lymphocytes Relative: 34 %
Lymphs Abs: 3.1 10*3/uL (ref 0.7–4.0)
MCH: 25.1 pg — ABNORMAL LOW (ref 26.0–34.0)
MCHC: 30.4 g/dL (ref 30.0–36.0)
MCV: 82.8 fL (ref 80.0–100.0)
Monocytes Absolute: 0.7 10*3/uL (ref 0.1–1.0)
Monocytes Relative: 8 %
Neutro Abs: 4.8 10*3/uL (ref 1.7–7.7)
Neutrophils Relative %: 53 %
Platelets: 529 10*3/uL — ABNORMAL HIGH (ref 150–400)
RBC: 4.06 MIL/uL (ref 3.87–5.11)
RDW: 15.8 % — ABNORMAL HIGH (ref 11.5–15.5)
WBC: 9 10*3/uL (ref 4.0–10.5)
nRBC: 0 % (ref 0.0–0.2)

## 2023-05-29 MED ORDER — FAMOTIDINE IN NACL 20-0.9 MG/50ML-% IV SOLN
20.0000 mg | Freq: Once | INTRAVENOUS | Status: AC
  Administered 2023-05-29: 20 mg via INTRAVENOUS
  Filled 2023-05-29: qty 50

## 2023-05-29 MED ORDER — DIPHENHYDRAMINE HCL 50 MG/ML IJ SOLN
25.0000 mg | Freq: Once | INTRAMUSCULAR | Status: AC
  Administered 2023-05-29: 25 mg via INTRAVENOUS

## 2023-05-29 MED ORDER — DIPHENHYDRAMINE HCL 50 MG/ML IJ SOLN
25.0000 mg | Freq: Once | INTRAMUSCULAR | Status: AC
  Administered 2023-05-29: 25 mg via INTRAVENOUS
  Filled 2023-05-29: qty 1

## 2023-05-29 MED ORDER — METHYLPREDNISOLONE SODIUM SUCC 125 MG IJ SOLR
INTRAMUSCULAR | Status: AC
  Administered 2023-05-29: 125 mg via INTRAVENOUS
  Filled 2023-05-29: qty 2

## 2023-05-29 MED ORDER — PREDNISONE 10 MG (21) PO TBPK: ORAL_TABLET | ORAL | 0 refills | Status: DC

## 2023-05-29 MED ORDER — METHYLPREDNISOLONE SODIUM SUCC 125 MG IJ SOLR: 125.0000 mg | Freq: Once | INTRAMUSCULAR | Status: AC

## 2023-05-29 MED ORDER — DIPHENHYDRAMINE HCL 50 MG/ML IJ SOLN: 50.0000 mg | Freq: Once | INTRAMUSCULAR | Status: DC

## 2023-05-29 MED ORDER — METHYLPREDNISOLONE SODIUM SUCC 125 MG IJ SOLR: 125.0000 mg | Freq: Once | INTRAMUSCULAR | Status: DC

## 2023-05-29 MED ORDER — FAMOTIDINE 20 MG PO TABS: 20.0000 mg | ORAL_TABLET | Freq: Two times a day (BID) | ORAL | 0 refills | Status: DC

## 2023-05-29 NOTE — Discharge Instructions (Addendum)
Please hold your Eliquis and carvedilol and talk to your cardiologist regarding further recommendations.  You may continue over-the-counter Benadryl 50 mg every 8 hours.

## 2023-05-29 NOTE — ED Provider Notes (Addendum)
[facility] Provider Note    Event Date/Time   First MD Initiated Contact with Patient 05/29/23 [id number]     (approximate)   History   Oral Swelling   HPI  Sharon [name] is a 73 y.o. female with history of hypertension, hyperlipidemia, CHF, recurrent angioedema who presents to the emergency department stating that her tongue started swelling around 11 PM.  She took her first doses of Eliquis  and carvedilol  that were prescribed to her by her cardiologist tonight.  She states that is the only new thing that she has done today.  No other new exposures.  No difficulty swallowing, breathing but does have some trouble talking currently.  No rash.  States normally symptoms improved with Benadryl , Solu-Medrol , Pepcid .  She is not on an ACE inhibitor.   History provided by patient, husband.    Past Medical History:  Diagnosis Date   Angio-edema    Anxiety    Cataract    CHF (congestive heart failure) (HCC)    GERD (gastroesophageal reflux disease)    Hyperlipidemia    Hypertension    Hypothyroidism    Vitamin D  deficiency     Past Surgical History:  Procedure Laterality Date   CATARACT EXTRACTION W/ INTRAOCULAR LENS IMPLANT Right 12/29/2016   Dr. Evalene Raw   CATARACT EXTRACTION W/ INTRAOCULAR LENS IMPLANT Left 01/15/2017   Dr. Evalene Bevis   COLONOSCOPY     INTUBATION-ENDOTRACHEAL WITH TRACHEOSTOMY STANDBY N/A 02/01/2023   Procedure: INTUBATION-ENDOTRACHEAL WITH TRACHEOSTOMY STANDBY;  Surgeon: Herminio Miu, MD;  Location: [facility] ORS;  Service: ENT;  Laterality: N/A;   LEEP     TRACHEOSTOMY TUBE PLACEMENT N/A 02/06/2023   Procedure: TRACHEOSTOMY;  Surgeon: Milissa Hamming, MD;  Location: [facility] ORS;  Service: ENT;  Laterality: N/A;   TUBAL LIGATION     VEIN LIGATION AND STRIPPING     x2    MEDICATIONS:  Prior to Admission medications   Medication Sig Start Date End Date Taking? Authorizing Provider  apixaban  (ELIQUIS ) 5 MG TABS tablet Take  1 tablet (5 mg total) by mouth 2 (two) times daily. 05/28/23   Loistine Sober, NP  apixaban  (ELIQUIS ) 5 MG TABS tablet Take 1 tablet (5 mg total) by mouth 2 (two) times daily. 05/28/23   Loistine Sober, NP  carvedilol  (COREG ) 3.125 MG tablet Take 1 tablet (3.125 mg total) by mouth 2 (two) times daily. 05/28/23 08/26/23  Loistine Sober, NP  EPINEPHrine  0.3 mg/0.3 mL IJ SOAJ injection Inject 0.3 mg into the muscle as needed.    [provider]  famotidine  (PEPCID ) 20 MG tablet Place 1 tablet (20 mg total) into feeding tube daily. 02/10/23   Kasa, Kurian, MD  hydrALAZINE  (APRESOLINE ) 25 MG tablet Take 25 mg by mouth every 8 (eight) hours. 05/11/23   [provider]  ipratropium-albuterol  (DUONEB) 0.5-2.5 (3) MG/3ML SOLN Take 3 mLs by nebulization every 4 (four) hours as needed. 02/09/23   Kasa, Kurian, MD  sertraline  (ZOLOFT ) 25 MG tablet Take 25 mg by mouth daily.    [provider]  vitamin B-12 (CYANOCOBALAMIN ) 100 MCG tablet Take 100 mcg by mouth daily.    [provider]    Physical Exam   Triage Vital Signs: ED Triage Vitals  Encounter Vitals Group     BP 05/28/23 2358 (!) 165/100     Systolic BP Percentile --      Diastolic BP Percentile --      Pulse Rate 05/28/23 2358 (!) 108  Resp 05/28/23 2358 19     Temp 05/28/23 2358 98.7 F (37.1 C)     Temp Source 05/28/23 2358 Oral     SpO2 05/28/23 2358 100 %     Weight 05/28/23 2359 206 lb (93.4 kg)     Height 05/28/23 2359 5' 6 (1.676 m)     Head Circumference --      Peak Flow --      Pain Score 05/28/23 2359 0     Pain Loc --      Pain Education --      Exclude from Growth Chart --     Most recent vital signs: Vitals:   05/28/23 2358 05/29/23 0100  BP: (!) 165/100 (!) 177/78  Pulse: (!) 108   Resp: 19   Temp: 98.7 F (37.1 C)   SpO2: 100%     CONSTITUTIONAL: Alert, responds appropriately to questions. Well-appearing; well-nourished HEAD: Normocephalic, atraumatic EYES:  Conjunctivae clear, pupils appear equal, sclera nonicteric ENT: normal nose; moist mucous membranes, airway patent, tongue swollen, muffled voice due to tongue swelling, no stridor, no trismus or drooling NECK: Supple, normal ROM, trach in place CARD: RRR; S1 and S2 appreciated RESP: Normal chest excursion without splinting or tachypnea; breath sounds clear and equal bilaterally; no wheezes, no rhonchi, no rales, no hypoxia or respiratory distress, speaking full sentences ABD/GI: Non-distended; soft, non-tender, no rebound, no guarding, no peritoneal signs BACK: The back appears normal EXT: Normal ROM in all joints; no deformity noted, no edema SKIN: Normal color for age and race; warm; no rash on exposed skin, no urticaria NEURO: Moves all extremities equally, normal speech PSYCH: The patient's mood and manner are appropriate.   ED Results / Procedures / Treatments   LABS: (all labs ordered are listed, but only abnormal results are displayed) Labs Reviewed  BASIC METABOLIC PANEL - Abnormal; Notable for the following components:      Result Value   Glucose, Bld 138 (*)    All other components within normal limits  CBC WITH DIFFERENTIAL/PLATELET - Abnormal; Notable for the following components:   Hemoglobin 10.2 (*)    HCT 33.6 (*)    MCH 25.1 (*)    RDW 15.8 (*)    Platelets 529 (*)    All other components within normal limits     EKG:  RADIOLOGY: My personal review and interpretation of imaging:    I have personally reviewed all radiology reports.   No results found.   PROCEDURES:  Critical Care performed: Yes, see critical care procedure note(s)   CRITICAL CARE Performed by: Josette Sink   Total critical care time: 45 minutes  Critical care time was exclusive of separately billable procedures and treating other patients.  Critical care was necessary to treat or prevent imminent or life-threatening deterioration.  Critical care was time spent personally by me  on the following activities: development of treatment plan with patient and/or surrogate as well as nursing, discussions with consultants, evaluation of patient's response to treatment, examination of patient, obtaining history from patient or surrogate, ordering and performing treatments and interventions, ordering and review of laboratory studies, ordering and review of radiographic studies, pulse oximetry and re-evaluation of patient's condition.   SABRA1-3 Lead EKG Interpretation  Performed by: Melville Engen, Josette SAILOR, DO Authorized by: Lea Walbert, Josette SAILOR, DO     Interpretation: abnormal     ECG rate:  108   ECG rate assessment: tachycardic     Rhythm: sinus tachycardia     Ectopy:  none     Conduction: normal       IMPRESSION / MDM / ASSESSMENT AND PLAN / ED COURSE  I reviewed the triage vital signs and the nursing notes.    Patient here with complaints of angioedema.  History of the same.  Just started Eliquis  and carvedilol  about an hour before symptoms started.  The patient is on the cardiac monitor to evaluate for evidence of arrhythmia and/or significant heart rate changes.   DIFFERENTIAL DIAGNOSIS (includes but not limited to):   Allergic reaction, angioedema, no sign of airway obstruction   Patient's presentation is most consistent with acute presentation with potential threat to life or bodily function.   PLAN: Will give IV Benadryl , Pepcid , Solu-Medrol  and monitor.  Basic labs pending.  Patient has recurrent history of the same.  Patient states she does not feel she needs epinephrine  at this time.   MEDICATIONS GIVEN IN ED: Medications  diphenhydrAMINE  (BENADRYL ) injection 25 mg (25 mg Intravenous Given 05/29/23 0029)  methylPREDNISolone  sodium succinate (SOLU-MEDROL ) 125 mg/2 mL injection 125 mg (125 mg Intravenous Given 05/29/23 0029)  famotidine  (PEPCID ) IVPB 20 mg premix (0 mg Intravenous Stopped 05/29/23 0322)  diphenhydrAMINE  (BENADRYL ) injection 25 mg (25 mg Intravenous Given  05/29/23 0104)     ED COURSE: Patient monitored for about 3 hours after IV medications.  She reports feeling better and her tongue swelling has improved but has not completely resolved.  Have offered her admission the patient states that this has happened to her many times before and that she feels like her tongue swelling will continue to improve like it has had previously after IV medications in the ED and she is comfortable with discharge home.  They will hold her carvedilol  and Eliquis  and follow-up with cardiology for further recommendations.  Will discharge on steroid taper and have them continue Benadryl , Pepcid .  Discussed return precautions.  Screening labs show stable mild anemia.  Normal electrolytes and creatinine.  At this time, I do not feel there is any life-threatening condition present. I reviewed all nursing notes, vitals, pertinent previous records.  All lab and urine results, EKGs, imaging ordered have been independently reviewed and interpreted by myself.  I reviewed all available radiology reports from any imaging ordered this visit.  Based on my assessment, I feel the patient is safe to be discharged home without further emergent workup and can continue workup as an outpatient as needed. Discussed all findings, treatment plan as well as usual and customary return precautions.  They verbalize understanding and are comfortable with this plan.  Outpatient follow-up has been provided as needed.  All questions have been answered.    CONSULTS:  none   OUTSIDE RECORDS REVIEWED: Reviewed last cardiology note.       FINAL CLINICAL IMPRESSION(S) / ED DIAGNOSES   Final diagnoses:  Angioedema, initial encounter     Rx / DC Orders   ED Discharge Orders          Ordered    predniSONE  (STERAPRED UNI-PAK 21 TAB) 10 MG (21) TBPK tablet        05/29/23 0252    famotidine  (PEPCID ) 20 MG tablet  2 times daily        05/29/23 0252             Note:  This document was  prepared using Dragon voice recognition software and may include unintentional dictation errors.   Ferry Matthis, Josette SAILOR, DO 05/29/23 0407    Vicke Plotner, Josette SAILOR, DO 05/29/23  0407  

## 2023-05-30 ENCOUNTER — Telehealth: Payer: Self-pay | Admitting: Student

## 2023-05-30 ENCOUNTER — Telehealth: Payer: Self-pay

## 2023-05-30 ENCOUNTER — Other Ambulatory Visit: Payer: Self-pay

## 2023-05-30 MED ORDER — METOPROLOL TARTRATE 25 MG PO TABS: 25.0000 mg | ORAL_TABLET | Freq: Two times a day (BID) | ORAL | 1 refills | Status: DC

## 2023-05-30 NOTE — Telephone Encounter (Signed)
 Attempted to contact patient to discuss message.  LVM to call back.  Left call back number.

## 2023-05-30 NOTE — Telephone Encounter (Signed)
 Pt c/o medication issue:  1. Name of Medication: Coreg  and Eliquis   2. How are you currently taking this medication (dosage and times per day)?   3. Are you having a reaction (difficulty breathing--STAT)?   4. What is your medication issue? Husband said she had an allergic reaction to these medicine on Monday night(05-28-23) patient had to go to [facility]  ER,  Husband wants to know what medicine does she need to take, since she can not take Eliquis  and Coreg ?

## 2023-05-30 NOTE — Telephone Encounter (Signed)
 Attempted to contact to discuss. LVM. Left call back number.

## 2023-05-30 NOTE — Telephone Encounter (Signed)
 Patient returned call.  Explained message from NP.   Patient will go back to Metoprolol  Tartrate 25 mg twice daily.   Updated medication list.   Removed Eliquis  and Carvedilol .  Thanks!

## 2023-05-30 NOTE — Telephone Encounter (Signed)
 Received call back from husband. Notes in epic from recent ED visit. Patient husband states they received medications and took them Monday night, by 11:00 that night she began to have severe swelling- face, hands, tongue. The only new medications were the Carvedilol  and Eliquis . Husband stated she fully improved starting yesterday. She is back to her normal self- they have not given her anymore of either medication, and are not sure where else to go from here.   They do have her on a current prednisone  pack-   Advised I would route a message to NP who recently seen patient as well as MD.   Husband verbalized understanding, thankful for call back.

## 2023-05-30 NOTE — Transitions of Care (Post Inpatient/ED Visit) (Signed)
   05/30/2023  Name: [name] MRN: [id number] DOB: [birth year]  Today's TOC FU Call Status: Today's TOC FU Call Status:: Unsuccessful Call (1st Attempt) Unsuccessful Call (1st Attempt) Date: 05/30/23  Attempted to reach the patient regarding the most recent Inpatient/ED visit.  Follow Up Plan: Additional outreach attempts will be made to reach the patient to complete the Transitions of Care (Post Inpatient/ED visit) call.   Signature Julian Lemmings, LPN [facility] Nurse Health Advisor Direct Dial [phone]

## 2023-05-30 NOTE — Telephone Encounter (Signed)
 PT called back with provider questions- left message for callback

## 2023-06-01 NOTE — Transitions of Care (Post Inpatient/ED Visit) (Signed)
   06/01/2023  Name: Sharon [name] MRN: [id number] DOB: August 12, 1950  Today's TOC FU Call Status: Today's TOC FU Call Status:: Successful TOC FU Call Completed Unsuccessful Call (1st Attempt) Date: 05/30/23 [facility] FU Call Complete Date: 06/01/23 Patient's Name and Date of Birth confirmed.  Transition Care Management Follow-up Telephone Call Date of Discharge: 05/29/23 Discharge Facility[facility] Type of Discharge: Emergency Department Reason for ED Visit: Other: (edema) How have you been since you were released from the hospital?: Better Any questions or concerns?: No  Items Reviewed: Did you receive and understand the discharge instructions provided?: Yes Medications obtained,verified, and reconciled?: Yes (Medications Reviewed) Any new allergies since your discharge?: No Dietary orders reviewed?: Yes Do you have support at home?: Yes People in Home: spouse  Medications Reviewed Today: Medications Reviewed Today     Reviewed by Emmitt Pan, LPN (Licensed Practical Nurse) on 06/01/23 at 1037  Med List Status: <None>   Medication Order Taking? Sig Documenting Provider Last Dose Status Informant  EPINEPHrine  0.3 mg/0.3 mL IJ SOAJ injection [id number] No Inject 0.3 mg into the muscle as needed. [provider] Taking Active   famotidine  (PEPCID ) 20 MG tablet [id number]  Take 1 tablet (20 mg total) by mouth 2 (two) times daily for 5 days. Ward, Josette SAILOR, DO  Active   hydrALAZINE  (APRESOLINE ) 25 MG tablet [id number] No Take 25 mg by mouth every 8 (eight) hours. [provider] Taking Active   ipratropium-albuterol  (DUONEB) 0.5-2.5 (3) MG/3ML SOLN [id number] No Take 3 mLs by nebulization every 4 (four) hours as needed. Kasa, Kurian, MD Taking Active   metoprolol  tartrate (LOPRESSOR ) 25 MG tablet [id number]  Take 1 tablet (25 mg total) by mouth 2 (two) times daily. Loistine Sober, NP  Active   predniSONE  (STERAPRED UNI-PAK 21 TAB) 10  MG (21) TBPK tablet [id number]  Take as directed Ward, Josette SAILOR, DO  Active   sertraline  (ZOLOFT ) 25 MG tablet [id number] No Take 25 mg by mouth daily. [provider] Taking Active   vitamin B-12 (CYANOCOBALAMIN ) 100 MCG tablet [id number] No Take 100 mcg by mouth daily. [provider] Taking Active             Home Care and Equipment/Supplies: Were Home Health Services Ordered?: NA Any new equipment or medical supplies ordered?: NA  Functional Questionnaire: Do you need assistance with bathing/showering or dressing?: No Do you need assistance with meal preparation?: No Do you need assistance with eating?: No Do you have difficulty maintaining continence: No Do you need assistance with getting out of bed/getting out of a chair/moving?: No Do you have difficulty managing or taking your medications?: No  Follow up appointments reviewed: PCP Follow-up appointment confirmed?: No (declined appt) MD Provider Line [phone] Given: No Specialist Hospital Follow-up appointment confirmed?: NA Do you need transportation to your follow-up appointment?: No Do you understand care options if your condition(s) worsen?: Yes-patient verbalized understanding    SIGNATURE Pan Emmitt, LPN [facility] Nurse Health Advisor Direct Dial [phone]

## 2023-06-08 ENCOUNTER — Other Ambulatory Visit: Payer: Self-pay

## 2023-06-08 ENCOUNTER — Emergency Department
Admission: EM | Admit: 2023-06-08 | Discharge: 2023-06-08 | Disposition: A | Discharge status: Home / Self Care | Payer: Medicare Other | Attending: Emergency Medicine | Admitting: Emergency Medicine

## 2023-06-08 ENCOUNTER — Other Ambulatory Visit: Payer: Self-pay | Admitting: Family Medicine

## 2023-06-08 DIAGNOSIS — R220 Localized swelling, mass and lump, head: Secondary | ICD-10-CM | POA: Diagnosis present

## 2023-06-08 DIAGNOSIS — T7840XA Allergy, unspecified, initial encounter: Secondary | ICD-10-CM | POA: Insufficient documentation

## 2023-06-08 MED ORDER — FAMOTIDINE 10 MG PO TABS: 10.0000 mg | ORAL_TABLET | Freq: Two times a day (BID) | ORAL | 0 refills | Status: DC

## 2023-06-08 MED ORDER — FAMOTIDINE IN NACL 20-0.9 MG/50ML-% IV SOLN
20.0000 mg | Freq: Once | INTRAVENOUS | Status: AC
  Administered 2023-06-08: 20 mg via INTRAVENOUS
  Filled 2023-06-08: qty 50

## 2023-06-08 MED ORDER — PREDNISONE 10 MG (21) PO TBPK: ORAL_TABLET | ORAL | 0 refills | Status: DC

## 2023-06-08 MED ORDER — HYDRALAZINE HCL 25 MG PO TABS: 25.0000 mg | ORAL_TABLET | Freq: Three times a day (TID) | ORAL | 1 refills | Status: DC

## 2023-06-08 MED ORDER — SODIUM CHLORIDE 0.9 % IV BOLUS
1000.0000 mL | Freq: Once | INTRAVENOUS | Status: AC
  Administered 2023-06-08: 1000 mL via INTRAVENOUS

## 2023-06-08 MED ORDER — METHYLPREDNISOLONE SODIUM SUCC 125 MG IJ SOLR
125.0000 mg | Freq: Once | INTRAMUSCULAR | Status: AC
  Administered 2023-06-08: 125 mg via INTRAVENOUS
  Filled 2023-06-08: qty 2

## 2023-06-08 MED ORDER — SERTRALINE HCL 25 MG PO TABS: 25.0000 mg | ORAL_TABLET | Freq: Every day | ORAL | 1 refills | Status: DC

## 2023-06-08 MED ORDER — DIPHENHYDRAMINE HCL 50 MG/ML IJ SOLN
25.0000 mg | Freq: Once | INTRAMUSCULAR | Status: AC
  Administered 2023-06-08: 25 mg via INTRAVENOUS
  Filled 2023-06-08: qty 1

## 2023-06-08 NOTE — ED Provider Triage Note (Signed)
Emergency Medicine Provider Triage Evaluation Note  Sharon [name] , a 73 y.o. female  was evaluated in triage.  Pt complains of allergic reaction, has Alphagan allergies, has trach due to severe reactions, husband gave her Benadryl and Allegra at home got a little better, however still having swelling in her throat and her tongue does not feel swollen.  No rashes time.  Review of Systems  Positive:  Negative:   Physical Exam  BP (!) 167/93 (BP Location: Left Arm)   Pulse 79   Temp (!) 97.4 F (36.3 C) (Oral)   Resp 18   SpO2 100%  Gen:   Awake, no distress   Resp:  Normal effort  MSK:   Moves extremities without difficulty  Other:    Medical Decision Making  Medically screening exam initiated at 8:50 PM.  Appropriate orders placed.  Sharon [name] was informed that the remainder of the evaluation will be completed by another provider, this initial triage assessment does not replace that evaluation, and the importance of remaining in the ED until their evaluation is complete.  Due to patient's history we will go ahead and have them put IV in her allergy protocols with Solu-Medrol, Benadryl, Pepcid initiated, 1 L normal saline   Faythe Ghee, PA-C 06/08/23 2051

## 2023-06-08 NOTE — Telephone Encounter (Signed)
Copied from CRM [id number]. Topic: Clinical - Medication Refill >> Jun 08, 2023 10:51 AM Marica Otter wrote: Most Recent Primary Care Visit:  Provider: [facility]  Department: [facility]  Visit Type: LAB VISIT  Date: 05/22/2023  Medication: hydrALAZINE (APRESOLINE) 25 MG tablet, sertraline (ZOLOFT) 25 MG tablet  Has the patient contacted their pharmacy? Yes, Pharmacy states they sent a request no request in system (Agent: If no, request that the patient contact the pharmacy for the refill. If patient does not wish to contact the pharmacy document the reason why and proceed with request.) (Agent: If yes, when and what did the pharmacy advise?)  Is this the correct pharmacy for this prescription? Yes If no, delete pharmacy and type the correct one.  This is the patient's preferred pharmacy:  [facility] [phone] [facility] [place] - [address] [place] [address] Phone: [phone] Fax: [phone]    Has the prescription been filled recently? No  Is the patient out of the medication? Yes  Has the patient been seen for an appointment in the last year OR does the patient have an upcoming appointment? Yes  Can we respond through MyChart? No  Agent: Please be advised that Rx refills may take up to 3 business days. We ask that you follow-up with your pharmacy.

## 2023-06-08 NOTE — ED Provider Notes (Signed)
[facility] Provider Note    Event Date/Time   First MD Initiated Contact with Patient 06/08/23 2115     (approximate)   History   Allergic Reaction   HPI  Sharon [name] is a 73 y.o. female with history of Alphagan allergy presents emergency department with swelling of the face and throat.  Patient has a trach from previous allergic reactions where she had to be intubated.  Husband gave her Allegra and Benadryl.  States she did have a little relief with that, her tongue is not swollen throat is not as bad but still feels like it is slightly constricted.  No wheezing      Physical Exam   Triage Vital Signs: ED Triage Vitals [06/08/23 2042]  Encounter Vitals Group     BP (!) 167/93     Systolic BP Percentile      Diastolic BP Percentile      Pulse Rate 79     Resp 18     Temp (!) 97.4 F (36.3 C)     Temp Source Oral     SpO2 100 %     Weight      Height      Head Circumference      Peak Flow      Pain Score 0     Pain Loc      Pain Education      Exclude from Growth Chart     Most recent vital signs: Vitals:   06/08/23 2042 06/08/23 2159  BP: (!) 167/93 (!) 172/106  Pulse: 79 68  Resp: 18 17  Temp: (!) 97.4 F (36.3 C)   SpO2: 100% 99%     General: Awake, no distress.   CV:  Good peripheral perfusion. regular rate and  rhythm Resp:  Normal effort. Lungs CTA Abd:  No distention.   Other:      ED Results / Procedures / Treatments   Labs (all labs ordered are listed, but only abnormal results are displayed) Labs Reviewed - No data to display   EKG     RADIOLOGY     PROCEDURES:   Procedures Chief Complaint  Patient presents with   Allergic Reaction      MEDICATIONS ORDERED IN ED: Medications  sodium chloride 0.9 % bolus 1,000 mL (0 mLs Intravenous Stopped 06/08/23 2159)  methylPREDNISolone sodium succinate (SOLU-MEDROL) 125 mg/2 mL injection 125 mg (125 mg Intravenous Given 06/08/23 2107)  famotidine  (PEPCID) IVPB 20 mg premix (0 mg Intravenous Stopped 06/08/23 2149)  diphenhydrAMINE (BENADRYL) injection 25 mg (25 mg Intravenous Given 06/08/23 2108)     IMPRESSION / MDM / ASSESSMENT AND PLAN / ED COURSE  I reviewed the triage vital signs and the nursing notes.                              Differential diagnosis includes, but is not limited to, anaphylaxis, allergic reaction, edema, angioedema  Patient's presentation is most consistent with acute presentation with potential threat to life or bodily function.   With no wheezing, patient does have trach, we will go ahead and do allergy protocols with Solu-Medrol, Pepcid, Benadryl and normal saline 1 L IV.  Patient is feeling much better after medications.  Sent a prescription for Sterapred and Pepcid to her pharmacy.  They do have EpiPen at home.  Did instruct husband to return emergency department if she is worsening.  They  are in agreement treatment plan.  Patient is discharged stable condition.      FINAL CLINICAL IMPRESSION(S) / ED DIAGNOSES   Final diagnoses:  Allergic reaction, initial encounter     Rx / DC Orders   ED Discharge Orders          Ordered    predniSONE (STERAPRED UNI-PAK 21 TAB) 10 MG (21) TBPK tablet        06/08/23 2144    famotidine (PEPCID AC) 10 MG tablet  2 times daily        06/08/23 2144             Note:  This document was prepared using Dragon voice recognition software and may include unintentional dictation errors.    Faythe Ghee, PA-C 06/08/23 2227    Trinna Post, MD 06/08/23 2251

## 2023-06-08 NOTE — Telephone Encounter (Signed)
Last OV was hospital f/u on 05/15/23

## 2023-06-08 NOTE — ED Triage Notes (Signed)
Pt c/o allergic reaction to alpha-gal today around 2 hours ago. Pt reports oral swelling, denies difficulty swallowing or breathing, denies hives/ itching, N/V. Pt is AOX4, NAD noted. Pt has tracheostomy, is able to speak in full sentences. Pt took 50 mg benadryl and allegra prior to arrival.

## 2023-06-08 NOTE — Discharge Instructions (Signed)
Benadryl 25 mg every 6 hours as needed Allegra 180 mg/day Pepcid twice daily Steroid pack as prescribed

## 2023-06-21 ENCOUNTER — Other Ambulatory Visit: Payer: Self-pay | Admitting: Unknown Physician Specialty

## 2023-06-21 DIAGNOSIS — T7840XA Allergy, unspecified, initial encounter: Secondary | ICD-10-CM | POA: Diagnosis not present

## 2023-06-25 ENCOUNTER — Other Ambulatory Visit: Payer: Self-pay

## 2023-06-25 ENCOUNTER — Encounter
Admission: RE | Admit: 2023-06-25 | Discharge: 2023-06-25 | Disposition: A | Discharge status: Home / Self Care | Payer: Medicare Other | Source: Ambulatory Visit | Attending: Unknown Physician Specialty | Admitting: Unknown Physician Specialty

## 2023-06-25 ENCOUNTER — Telehealth: Payer: Self-pay | Admitting: *Deleted

## 2023-06-25 ENCOUNTER — Ambulatory Visit: Payer: Medicare Other | Attending: Student

## 2023-06-25 DIAGNOSIS — I5032 Chronic diastolic (congestive) heart failure: Secondary | ICD-10-CM | POA: Diagnosis not present

## 2023-06-25 HISTORY — DX: Deficiency of other specified B group vitamins: E53.8

## 2023-06-25 HISTORY — DX: Chronic diastolic (congestive) heart failure: I50.32

## 2023-06-25 HISTORY — DX: Unspecified urinary incontinence: R32

## 2023-06-25 HISTORY — DX: Supraventricular tachycardia, unspecified: I47.10

## 2023-06-25 HISTORY — DX: Essential (primary) hypertension: I10

## 2023-06-25 LAB — ECHOCARDIOGRAM COMPLETE
AV Mean grad: 3 mmHg
AV Peak grad: 5.7 mmHg
Ao pk vel: 1.19 m/s
Area-P 1/2: 3.65 cm2
S' Lateral: 3.1 cm

## 2023-06-25 NOTE — Telephone Encounter (Signed)
 Copied from CRM [id number]. Topic: Clinical - Medication Question >> Jun 25, 2023  4:18 PM Isabell A wrote: Reason for CRM: Karrah Mangini, spouse states [facility] told him the pt should be on cholesterol medication - he is not aware of which medication that is. Fayrene Fearing would like to speak to a nurse about this.

## 2023-06-25 NOTE — Telephone Encounter (Signed)
 She was on crestor in past and I think chose to stop it but not sure why  She sees cardiology as well  The cholesterol control is to reduce risk of vascular issues - MI/stroke in the future   Lab Results  Component Value Date   CHOL 144 01/02/2023   HDL 61.00 01/02/2023   LDLCALC 46 01/02/2023   LDLDIRECT 111.0 12/28/2021   TRIG 241 (H) 02/05/2023   CHOLHDL 2 01/02/2023  This lab was done when on crestor and it was very well controlled

## 2023-06-25 NOTE — Patient Instructions (Signed)
 Your procedure is scheduled on: Tuesday, March 4 Report to the Registration Desk on the 1st floor of the [facility] To find out your arrival time, please call [phone] between 1PM - 3PM on: Monday, March 3 If your arrival time is 6:00 am, do not arrive before that time as the [facility] entrance doors do not open until 6:00 am.  REMEMBER: Instructions that are not followed completely may result in serious medical risk, up to and including death; or upon the discretion of your surgeon and anesthesiologist your surgery may need to be rescheduled.  Do not eat or drink after midnight the night before surgery.  No gum chewing or hard candies.  One week prior to surgery: Stop Anti-inflammatories (NSAIDS) such as Advil, Aleve, Ibuprofen, Motrin, Naproxen, Naprosyn and Aspirin based products such as Excedrin, Goody's Powder, BC Powder. Stop ANY OVER THE COUNTER supplements until after surgery.  You may however, continue to take Tylenol if needed for pain up until the day of surgery.  Continue taking all of your other prescription medications up until the day of surgery.  ON THE DAY OF SURGERY ONLY TAKE THESE MEDICATIONS WITH SIPS OF WATER:  famotidine (PEPCID)  hydrALAZINE  ipratropium-albuterol (DUONEB) nebulizer metoprolol tartrate  sertraline (ZOLOFT)   No Alcohol for 24 hours before or after surgery.  No Smoking including e-cigarettes for 24 hours before surgery.  No chewable tobacco products for at least 6 hours before surgery.  No nicotine patches on the day of surgery.  Do not use any "recreational" drugs for at least a week (preferably 2 weeks) before your surgery.  Please be advised that the combination of cocaine and anesthesia may have negative outcomes, up to and including death. If you test positive for cocaine, your surgery will be cancelled.  On the morning of surgery brush your teeth with toothpaste and water, you may rinse your mouth with mouthwash if you  wish. Do not swallow any toothpaste or mouthwash.  Do not wear jewelry, make-up, hairpins, clips or nail polish.  For welded (permanent) jewelry: bracelets, anklets, waist bands, etc.  Please have this removed prior to surgery.  If it is not removed, there is a chance that hospital personnel will need to cut it off on the day of surgery.  Do not wear lotions, powders, or perfumes.   Do not shave body hair from the neck down 48 hours before surgery.  Contact lenses, hearing aids and dentures may not be worn into surgery.  Do not bring valuables to the hospital. [facility] is not responsible for any missing/lost belongings or valuables.   Notify your doctor if there is any change in your medical condition (cold, fever, infection).  Wear comfortable clothing (specific to your surgery type) to the hospital.  If you are being discharged the day of surgery, you will not be allowed to drive home. You will need a responsible individual to drive you home and stay with you for 24 hours after surgery.   If you are taking public transportation, you will need to have a responsible individual with you.  Please call the Pre-admissions Testing Dept. at [phone] if you have any questions about these instructions.  Surgery Visitation Policy:  Patients having surgery or a procedure may have two visitors.  Children under the age of 52 must have an adult with them who is not the patient.

## 2023-06-26 ENCOUNTER — Observation Stay
Admission: RE | Admit: 2023-06-26 | Discharge: 2023-06-28 | Disposition: A | Discharge status: Home / Self Care | Payer: Medicare Other | Attending: Osteopathic Medicine | Admitting: Osteopathic Medicine

## 2023-06-26 ENCOUNTER — Encounter
Admission: RE | Disposition: A | Discharge status: Home / Self Care | Payer: Self-pay | Source: Home / Self Care | Attending: Osteopathic Medicine

## 2023-06-26 ENCOUNTER — Ambulatory Visit: Admitting: Certified Registered"

## 2023-06-26 ENCOUNTER — Ambulatory Visit: Admitting: Urgent Care

## 2023-06-26 ENCOUNTER — Inpatient Hospital Stay

## 2023-06-26 ENCOUNTER — Encounter: Payer: Self-pay | Admitting: Unknown Physician Specialty

## 2023-06-26 ENCOUNTER — Other Ambulatory Visit: Payer: Self-pay

## 2023-06-26 DIAGNOSIS — R1310 Dysphagia, unspecified: Secondary | ICD-10-CM | POA: Insufficient documentation

## 2023-06-26 DIAGNOSIS — I700 Atherosclerosis of aorta: Secondary | ICD-10-CM | POA: Diagnosis not present

## 2023-06-26 DIAGNOSIS — E039 Hypothyroidism, unspecified: Secondary | ICD-10-CM | POA: Insufficient documentation

## 2023-06-26 DIAGNOSIS — J668 Airway disease due to other specific organic dusts: Secondary | ICD-10-CM | POA: Diagnosis not present

## 2023-06-26 DIAGNOSIS — I10 Essential (primary) hypertension: Secondary | ICD-10-CM | POA: Diagnosis not present

## 2023-06-26 DIAGNOSIS — J988 Other specified respiratory disorders: Secondary | ICD-10-CM | POA: Diagnosis not present

## 2023-06-26 DIAGNOSIS — Z87891 Personal history of nicotine dependence: Secondary | ICD-10-CM | POA: Diagnosis not present

## 2023-06-26 DIAGNOSIS — R491 Aphonia: Secondary | ICD-10-CM | POA: Diagnosis present

## 2023-06-26 DIAGNOSIS — D72118 Other hypereosinophilic syndrome: Secondary | ICD-10-CM | POA: Insufficient documentation

## 2023-06-26 DIAGNOSIS — Z930 Tracheostomy status: Secondary | ICD-10-CM | POA: Diagnosis not present

## 2023-06-26 DIAGNOSIS — Z91040 Latex allergy status: Secondary | ICD-10-CM | POA: Diagnosis not present

## 2023-06-26 DIAGNOSIS — E785 Hyperlipidemia, unspecified: Secondary | ICD-10-CM | POA: Diagnosis not present

## 2023-06-26 DIAGNOSIS — Z79899 Other long term (current) drug therapy: Secondary | ICD-10-CM | POA: Insufficient documentation

## 2023-06-26 DIAGNOSIS — I5032 Chronic diastolic (congestive) heart failure: Secondary | ICD-10-CM | POA: Diagnosis not present

## 2023-06-26 DIAGNOSIS — I110 Hypertensive heart disease with heart failure: Secondary | ICD-10-CM | POA: Insufficient documentation

## 2023-06-26 DIAGNOSIS — Z430 Encounter for attention to tracheostomy: Secondary | ICD-10-CM | POA: Diagnosis not present

## 2023-06-26 DIAGNOSIS — R918 Other nonspecific abnormal finding of lung field: Secondary | ICD-10-CM | POA: Diagnosis not present

## 2023-06-26 DIAGNOSIS — I5033 Acute on chronic diastolic (congestive) heart failure: Secondary | ICD-10-CM | POA: Diagnosis not present

## 2023-06-26 HISTORY — PX: TRACHEOSTOMY TUBE PLACEMENT: SHX814

## 2023-06-26 LAB — BLOOD GAS, VENOUS
Acid-Base Excess: 4.4 mmol/L — ABNORMAL HIGH (ref 0.0–2.0)
Bicarbonate: 26.7 mmol/L (ref 20.0–28.0)
O2 Saturation: 99.4 %
Patient temperature: 37
pCO2, Ven: 32 mmHg — ABNORMAL LOW (ref 44–60)
pH, Ven: 7.53 — ABNORMAL HIGH (ref 7.25–7.43)
pO2, Ven: 203 mmHg — ABNORMAL HIGH (ref 32–45)

## 2023-06-26 LAB — TYPE AND SCREEN
ABO/RH(D): A POS
Antibody Screen: NEGATIVE

## 2023-06-26 LAB — CBC WITH DIFFERENTIAL/PLATELET
Abs Immature Granulocytes: 0.03 10*3/uL (ref 0.00–0.07)
Basophils Absolute: 0 10*3/uL (ref 0.0–0.1)
Basophils Relative: 0 %
Eosinophils Absolute: 0.2 10*3/uL (ref 0.0–0.5)
Eosinophils Relative: 3 %
HCT: 26.2 % — ABNORMAL LOW (ref 36.0–46.0)
Hemoglobin: 7.9 g/dL — ABNORMAL LOW (ref 12.0–15.0)
Immature Granulocytes: 0 %
Lymphocytes Relative: 22 %
Lymphs Abs: 1.7 10*3/uL (ref 0.7–4.0)
MCH: 25.3 pg — ABNORMAL LOW (ref 26.0–34.0)
MCHC: 30.2 g/dL (ref 30.0–36.0)
MCV: 84 fL (ref 80.0–100.0)
Monocytes Absolute: 0.6 10*3/uL (ref 0.1–1.0)
Monocytes Relative: 8 %
Neutro Abs: 5.1 10*3/uL (ref 1.7–7.7)
Neutrophils Relative %: 67 %
Platelets: 298 10*3/uL (ref 150–400)
RBC: 3.12 MIL/uL — ABNORMAL LOW (ref 3.87–5.11)
RDW: 17.8 % — ABNORMAL HIGH (ref 11.5–15.5)
WBC: 7.6 10*3/uL (ref 4.0–10.5)
nRBC: 0 % (ref 0.0–0.2)

## 2023-06-26 LAB — COMPREHENSIVE METABOLIC PANEL
ALT: 8 U/L (ref 0–44)
AST: 14 U/L — ABNORMAL LOW (ref 15–41)
Albumin: 3.3 g/dL — ABNORMAL LOW (ref 3.5–5.0)
Alkaline Phosphatase: 54 U/L (ref 38–126)
Anion gap: 8 (ref 5–15)
BUN: 9 mg/dL (ref 8–23)
CO2: 24 mmol/L (ref 22–32)
Calcium: 9.2 mg/dL (ref 8.9–10.3)
Chloride: 105 mmol/L (ref 98–111)
Creatinine, Ser: 0.84 mg/dL (ref 0.44–1.00)
GFR, Estimated: >60 mL/min (ref >60)
Glucose, Bld: 107 mg/dL — ABNORMAL HIGH (ref 70–99)
Potassium: 3.9 mmol/L (ref 3.5–5.1)
Sodium: 137 mmol/L (ref 135–145)
Total Bilirubin: 0.7 mg/dL (ref 0.0–1.2)
Total Protein: 6.3 g/dL — ABNORMAL LOW (ref 6.5–8.1)

## 2023-06-26 LAB — MAGNESIUM: Magnesium: 2 mg/dL (ref 1.7–2.4)

## 2023-06-26 LAB — PROTIME-INR
INR: 1 (ref 0.8–1.2)
Prothrombin Time: 13.4 s (ref 11.4–15.2)

## 2023-06-26 LAB — MRSA NEXT GEN BY PCR, NASAL: MRSA by PCR Next Gen: NOT DETECTED

## 2023-06-26 LAB — APTT: aPTT: 25 s (ref 24–36)

## 2023-06-26 LAB — PHOSPHORUS: Phosphorus: 3.4 mg/dL (ref 2.5–4.6)

## 2023-06-26 SURGERY — CREATION, TRACHEOSTOMY
Anesthesia: General

## 2023-06-26 MED ORDER — CHLORHEXIDINE GLUCONATE CLOTH 2 % EX PADS
6.0000 | MEDICATED_PAD | Freq: Every day | CUTANEOUS | Status: DC
  Administered 2023-06-26: 6 via TOPICAL

## 2023-06-26 MED ORDER — LIDOCAINE HCL (CARDIAC) PF 100 MG/5ML IV SOSY
PREFILLED_SYRINGE | INTRAVENOUS | Status: DC | PRN
  Administered 2023-06-26: 80 mg via INTRAVENOUS

## 2023-06-26 MED ORDER — FENTANYL CITRATE (PF) 100 MCG/2ML IJ SOLN
INTRAMUSCULAR | Status: DC | PRN
Start: 2023-06-26 — End: 2023-06-26
  Administered 2023-06-26: 25 ug via INTRAVENOUS
  Administered 2023-06-26: 50 ug via INTRAVENOUS
  Administered 2023-06-26: 25 ug via INTRAVENOUS

## 2023-06-26 MED ORDER — ROCURONIUM BROMIDE 100 MG/10ML IV SOLN
INTRAVENOUS | Status: DC | PRN
Start: 2023-06-26 — End: 2023-06-26
  Administered 2023-06-26: 50 mg via INTRAVENOUS

## 2023-06-26 MED ORDER — PANTOPRAZOLE SODIUM 40 MG IV SOLR
40.0000 mg | Freq: Every day | INTRAVENOUS | Status: DC
  Administered 2023-06-26 – 2023-06-27 (×2): 40 mg via INTRAVENOUS
  Filled 2023-06-26 (×2): qty 10

## 2023-06-26 MED ORDER — SUCCINYLCHOLINE CHLORIDE 200 MG/10ML IV SOSY
PREFILLED_SYRINGE | INTRAVENOUS | Status: DC | PRN
  Administered 2023-06-26: 100 mg via INTRAVENOUS

## 2023-06-26 MED ORDER — LIDOCAINE-EPINEPHRINE 1 %-1:100000 IJ SOLN
INTRAMUSCULAR | Status: DC | PRN
  Administered 2023-06-26: 3 mL

## 2023-06-26 MED ORDER — OXYCODONE HCL 5 MG PO TABS: 5.0000 mg | ORAL_TABLET | Freq: Once | ORAL | Status: DC | PRN

## 2023-06-26 MED ORDER — KETOROLAC TROMETHAMINE 30 MG/ML IJ SOLN
30.0000 mg | Freq: Once | INTRAMUSCULAR | Status: DC
  Filled 2023-06-26: qty 1

## 2023-06-26 MED ORDER — ACETAMINOPHEN 325 MG PO TABS
650.0000 mg | ORAL_TABLET | Freq: Four times a day (QID) | ORAL | Status: DC | PRN
  Filled 2023-06-26: qty 2

## 2023-06-26 MED ORDER — PROPOFOL 10 MG/ML IV BOLUS
INTRAVENOUS | Status: DC | PRN
  Administered 2023-06-26: 150 mg via INTRAVENOUS

## 2023-06-26 MED ORDER — SUGAMMADEX SODIUM 200 MG/2ML IV SOLN
INTRAVENOUS | Status: DC | PRN
  Administered 2023-06-26: 200 mg via INTRAVENOUS

## 2023-06-26 MED ORDER — PROPOFOL 1000 MG/100ML IV EMUL
INTRAVENOUS | Status: AC
  Filled 2023-06-26: qty 100

## 2023-06-26 MED ORDER — FENTANYL CITRATE (PF) 100 MCG/2ML IJ SOLN
INTRAMUSCULAR | Status: AC
  Filled 2023-06-26: qty 2

## 2023-06-26 MED ORDER — CHLORHEXIDINE GLUCONATE 0.12 % MT SOLN: 15.0000 mL | Freq: Once | OROMUCOSAL | Status: DC

## 2023-06-26 MED ORDER — MIDAZOLAM HCL 2 MG/2ML IJ SOLN
INTRAMUSCULAR | Status: AC
  Filled 2023-06-26: qty 2

## 2023-06-26 MED ORDER — LIDOCAINE-EPINEPHRINE 1 %-1:100000 IJ SOLN
INTRAMUSCULAR | Status: AC
  Filled 2023-06-26: qty 1

## 2023-06-26 MED ORDER — HYDRALAZINE HCL 20 MG/ML IJ SOLN: 10.0000 mg | Freq: Four times a day (QID) | INTRAMUSCULAR | Status: DC | PRN

## 2023-06-26 MED ORDER — ORAL CARE MOUTH RINSE: 15.0000 mL | Freq: Once | OROMUCOSAL | Status: DC

## 2023-06-26 MED ORDER — OXYCODONE HCL 5 MG/5ML PO SOLN: 5.0000 mg | Freq: Once | ORAL | Status: DC | PRN

## 2023-06-26 MED ORDER — CALCIUM CARBONATE ANTACID 500 MG PO CHEW
1.0000 | CHEWABLE_TABLET | Freq: Three times a day (TID) | ORAL | Status: DC | PRN
  Administered 2023-06-26: 200 mg via ORAL
  Filled 2023-06-26: qty 1

## 2023-06-26 MED ORDER — DOCUSATE SODIUM 100 MG PO CAPS: 100.0000 mg | ORAL_CAPSULE | Freq: Two times a day (BID) | ORAL | Status: DC | PRN

## 2023-06-26 MED ORDER — CHLORHEXIDINE GLUCONATE 0.12 % MT SOLN
OROMUCOSAL | Status: AC
Start: 2023-06-26 — End: ?
  Filled 2023-06-26: qty 15

## 2023-06-26 MED ORDER — LACTATED RINGERS IV SOLN: INTRAVENOUS | Status: DC

## 2023-06-26 MED ORDER — ORAL CARE MOUTH RINSE
15.0000 mL | OROMUCOSAL | Status: DC
  Administered 2023-06-26 – 2023-06-28 (×3): 15 mL via OROMUCOSAL

## 2023-06-26 MED ORDER — IPRATROPIUM-ALBUTEROL 0.5-2.5 (3) MG/3ML IN SOLN: 3.0000 mL | RESPIRATORY_TRACT | Status: DC | PRN

## 2023-06-26 MED ORDER — ORAL CARE MOUTH RINSE: 15.0000 mL | OROMUCOSAL | Status: DC | PRN

## 2023-06-26 MED ORDER — ONDANSETRON HCL 4 MG/2ML IJ SOLN
INTRAMUSCULAR | Status: DC | PRN
  Administered 2023-06-26: 4 mg via INTRAVENOUS

## 2023-06-26 MED ORDER — FENTANYL CITRATE (PF) 100 MCG/2ML IJ SOLN: 25.0000 ug | INTRAMUSCULAR | Status: DC | PRN

## 2023-06-26 MED ORDER — POLYETHYLENE GLYCOL 3350 17 G PO PACK: 17.0000 g | PACK | Freq: Every day | ORAL | Status: DC | PRN

## 2023-06-26 SURGICAL SUPPLY — 33 items
BLADE SURG 15 STRL LF DISP TIS (BLADE) ×2 IMPLANT
BLADE SURG SZ11 CARB STEEL (BLADE) ×2 IMPLANT
CORD BIP STRL DISP 12FT (MISCELLANEOUS) IMPLANT
ELECT CAUTERY BLADE TIP 2.5 (TIP) ×1 IMPLANT
ELECT REM PT RETURN 9FT ADLT (ELECTROSURGICAL) ×1 IMPLANT
ELECTRODE CAUTERY BLDE TIP 2.5 (TIP) ×2 IMPLANT
ELECTRODE REM PT RTRN 9FT ADLT (ELECTROSURGICAL) ×2 IMPLANT
FORCEPS JEWEL BIP 4-3/4 STR (INSTRUMENTS) IMPLANT
GAUZE 4X4 16PLY ~~LOC~~+RFID DBL (SPONGE) ×2 IMPLANT
GLOVE BIO SURGEON STRL SZ7.5 (GLOVE) ×2 IMPLANT
GOWN STRL REUS W/ TWL LRG LVL3 (GOWN DISPOSABLE) ×6 IMPLANT
HEMOSTAT SURGICEL 2X3 (HEMOSTASIS) IMPLANT
HLDR TRACH TUBE NECKBAND 18 (MISCELLANEOUS) ×2 IMPLANT
LABEL OR SOLS (LABEL) ×2 IMPLANT
MANIFOLD NEPTUNE II (INSTRUMENTS) ×2 IMPLANT
NS IRRIG 500ML POUR BTL (IV SOLUTION) ×2 IMPLANT
PACK HEAD/NECK (MISCELLANEOUS) ×2 IMPLANT
SHEARS HARMONIC 9CM CVD (BLADE) ×2 IMPLANT
SOL PREP PVP 2OZ (MISCELLANEOUS) ×1 IMPLANT
SOLUTION PREP PVP 2OZ (MISCELLANEOUS) ×2 IMPLANT
SPONGE DRAIN TRACH 4X4 STRL 2S (GAUZE/BANDAGES/DRESSINGS) ×2 IMPLANT
SPONGE KITTNER 5P (MISCELLANEOUS) ×2 IMPLANT
SPONGE VERSALON 4X4 4PLY (MISCELLANEOUS) ×2 IMPLANT
SUCTION TUBE FRAZIER 10FR DISP (SUCTIONS) ×2 IMPLANT
SUT ETHILON 2 0 FS 18 (SUTURE) ×2 IMPLANT
SUT SILK 2 0 SH (SUTURE) ×2 IMPLANT
SUT SILK 2-0 18XBRD TIE 12 (SUTURE) IMPLANT
SUT VIC AB 3-0 PS2 18XBRD (SUTURE) ×2 IMPLANT
TRAP FLUID SMOKE EVACUATOR (MISCELLANEOUS) ×2 IMPLANT
TUBE TRACH 6.0 CUFF FLEX (MISCELLANEOUS) IMPLANT
TUBE TRACH FLEX 4.0 CUFF (MISCELLANEOUS) IMPLANT
TUBE TRACH FLEX 8.5 CUFF (MISCELLANEOUS) IMPLANT
WATER STERILE IRR 500ML POUR (IV SOLUTION) ×2 IMPLANT

## 2023-06-26 NOTE — Progress Notes (Signed)
 PHARMACY CONSULT NOTE - FOLLOW UP  Pharmacy Consult for Electrolyte Monitoring and Replacement   Recent Labs: Potassium (mmol/L)  Date Value  06/26/2023 3.9   Magnesium (mg/dL)  Date Value  16/01/9603 2.0   Calcium (mg/dL)  Date Value  54/12/8117 9.2   Albumin (g/dL)  Date Value  14/78/2956 3.3 (L)   Phosphorus (mg/dL)  Date Value  21/30/8657 3.4   Sodium (mmol/L)  Date Value  06/26/2023 137   Assessment: BC is a 73 yo female who presented to [facility] for tracheostomy revision complex. Pharmacy was consulted for manage this patient's electrolytes while they're in the ICU for overnight observation.   Goal of Therapy:  Electrolytes WNL  Plan:  No replacement required at this time Monitor electrolytes with AM labs   Effie Shy, PharmD Pharmacy Resident  06/26/2023 4:21 PM

## 2023-06-26 NOTE — Anesthesia Procedure Notes (Addendum)
 Procedure Name: Intubation Date/Time: 06/26/2023 7:30 AM  Performed by: Mohammed Kindle, CRNAPre-anesthesia Checklist: Patient identified, Emergency Drugs available, Suction available and Patient being monitored Patient Re-evaluated:Patient Re-evaluated prior to induction Oxygen Delivery Method: Circle system utilized Preoxygenation: Pre-oxygenation with 100% oxygen Induction Type: IV induction and Rapid sequence Laryngoscope Size: McGrath and 3 Grade View: Grade I Tube type: Oral Tube size: 6.0 mm Number of attempts: 1 Airway Equipment and Method: Stylet and Oral airway Placement Confirmation: ETT inserted through vocal cords under direct vision, positive ETCO2 and breath sounds checked- equal and bilateral Secured at: 22 cm Tube secured with: Tape Dental Injury: Teeth and Oropharynx as per pre-operative assessment  Difficulty Due To: Difficulty was anticipated

## 2023-06-26 NOTE — Anesthesia Postprocedure Evaluation (Signed)
 Anesthesia Post Note  Patient: Sharon [name]  Procedure(s) Performed: TRACHEOSTOMY REVISON COMPLEX  Patient location during evaluation: PACU Anesthesia Type: General Level of consciousness: awake and alert Pain management: pain level controlled Vital Signs Assessment: post-procedure vital signs reviewed and stable Respiratory status: spontaneous breathing, nonlabored ventilation and respiratory function stable (face mask over trach) Cardiovascular status: blood pressure returned to baseline and stable Postop Assessment: no apparent nausea or vomiting Anesthetic complications: yes   Encounter Notable Events  Notable Event Outcome Phase Comment  Difficult to intubate - expected  Intraprocedure Filed from anesthesia note documentation.     Last Vitals:  Vitals:   06/26/23 0937 06/26/23 1002  BP: (!) 165/92 (!) 155/76  Pulse: 83 83  Resp: (!) 29 10  Temp: 37.1 C   SpO2: 100% 100%    Last Pain:  Vitals:   06/26/23 0937  TempSrc: Oral  PainSc: 0-No pain                 Cleda Mccreedy Cardin Nitschke

## 2023-06-26 NOTE — Progress Notes (Signed)
 RT SPENT 15 MINUTES WITH PATIENT AND PT'S HUSBAND GOING OVER HOW TO SUCTION PT'S TRACH,HOW TO REPLACE INNER CANNULA AND GAUZE UNDER TRACH.PT''S HUSBAND IS NOW COMFORTABLE WITH THESE TASKS FOR HOME.

## 2023-06-26 NOTE — Transfer of Care (Signed)
 Immediate Anesthesia Transfer of Care Note  Patient: Sharon [name]  Procedure(s) Performed: TRACHEOSTOMY REVISON COMPLEX  Patient Location: PACU  Anesthesia Type:General  Level of Consciousness: awake, drowsy, and patient cooperative  Airway & Oxygen Therapy: Patient Spontanous Breathing and Patient connected to tracheostomy mask oxygen  Post-op Assessment: Report given to RN and Post -op Vital signs reviewed and stable  Post vital signs: Reviewed and stable  Last Vitals:  Vitals Value Taken Time  BP 141/75 06/26/23 0817  Temp 36.1 C 06/26/23 0819  Pulse 85 06/26/23 0824  Resp 15 06/26/23 0824  SpO2 98 % 06/26/23 0824  Vitals shown include unfiled device data.  Last Pain:  Vitals:   06/26/23 0639  TempSrc: Temporal  PainSc: 0-No pain      Patients Stated Pain Goal: 0 (06/26/23 0102)  Complications:  Encounter Notable Events  Notable Event Outcome Phase Comment  Difficult to intubate - expected  Intraprocedure Filed from anesthesia note documentation.

## 2023-06-26 NOTE — Telephone Encounter (Signed)
 Left VM requesting pt to call the office back

## 2023-06-26 NOTE — H&P (Signed)
 The patient's history has been reviewed, patient examined, no change in status, stable for surgery.  Questions were answered to the patients satisfaction.

## 2023-06-26 NOTE — Anesthesia Preprocedure Evaluation (Signed)
 Anesthesia Evaluation  Patient identified by MRN, date of birth, ID band Patient awake    Reviewed: Allergy & Precautions, NPO status , Patient's Chart, lab work & pertinent test results  History of Anesthesia Complications Negative for: history of anesthetic complications  Airway Mallampati: III  TM Distance: <3 FB Neck ROM: full    Dental  (+) Chipped   Pulmonary neg pulmonary ROS, neg shortness of breath, former smoker   Pulmonary exam normal        Cardiovascular Exercise Tolerance: Good hypertension, +CHF  (-) Past MI Normal cardiovascular exam     Neuro/Psych   Anxiety     negative neurological ROS     GI/Hepatic Neg liver ROS,GERD  Controlled,,  Endo/Other  Hypothyroidism    Renal/GU      Musculoskeletal   Abdominal   Peds  Hematology negative hematology ROS (+)   Anesthesia Other Findings Past Medical History: No date: Angio-edema No date: Anxiety No date: Cataract No date: CHF (congestive heart failure) (HCC) No date: Chronic heart failure with preserved ejection fraction (HCC) No date: Essential hypertension No date: GERD (gastroesophageal reflux disease) No date: Hyperlipidemia No date: Hypothyroidism No date: SVT (supraventricular tachycardia) (HCC) 01/2023: Tracheostomy in place [facility]) No date: Urinary incontinence No date: Vitamin B 12 deficiency No date: Vitamin D deficiency  Past Surgical History: 12/29/2016: CATARACT EXTRACTION W/ INTRAOCULAR LENS IMPLANT; Right     Comment:  Dr. Mia Creek 01/15/2017: CATARACT EXTRACTION W/ INTRAOCULAR LENS IMPLANT; Left     Comment:  Dr. Mia Creek No date: COLONOSCOPY 02/01/2023: INTUBATION-ENDOTRACHEAL WITH TRACHEOSTOMY STANDBY; N/A     Comment:  Procedure: INTUBATION-ENDOTRACHEAL WITH TRACHEOSTOMY               STANDBY;  Surgeon: Linus Salmons, MD;  Location: [facility]               ORS;  Service: ENT;  Laterality: N/A; No date:  LEEP 01/2023: PEG TUBE PLACEMENT 04/2023: PEG TUBE REMOVAL 02/06/2023: TRACHEOSTOMY TUBE PLACEMENT; N/A     Comment:  Procedure: TRACHEOSTOMY;  Surgeon: Bud Face,               MD;  Location: [facility] ORS;  Service: ENT;  Laterality: N/A; No date: TUBAL LIGATION No date: VEIN LIGATION AND STRIPPING     Comment:  x2  BMI    Body Mass Index: 32.60 kg/m      Reproductive/Obstetrics negative OB ROS                             Anesthesia Physical Anesthesia Plan  ASA: 3  Anesthesia Plan: General ETT   Post-op Pain Management:    Induction: Intravenous  PONV Risk Score and Plan: Ondansetron, Dexamethasone, Midazolam and Treatment may vary due to age or medical condition  Airway Management Planned: Oral ETT  Additional Equipment:   Intra-op Plan:   Post-operative Plan: Extubation in OR  Informed Consent: I have reviewed the patients History and Physical, chart, labs and discussed the procedure including the risks, benefits and alternatives for the proposed anesthesia with the patient or authorized representative who has indicated his/her understanding and acceptance.     Dental Advisory Given  Plan Discussed with: Anesthesiologist, CRNA and Surgeon  Anesthesia Plan Comments: (Patient consented for risks of anesthesia including but not limited to:  - adverse reactions to medications - damage to eyes, teeth, lips or other oral mucosa - nerve damage due to positioning  -  sore throat or hoarseness - Damage to heart, brain, nerves, lungs, other parts of body or loss of life  Patient voiced understanding and assent.)       Anesthesia Quick Evaluation

## 2023-06-26 NOTE — Care Management CC44 (Signed)
 Condition Code 44 Documentation Completed  Patient Details  Name: Sharon [name] MRN: [id number] Date of Birth: 01/15/1951   Condition Code 44 given:  Yes Patient signature on Condition Code 44 notice:  Yes Documentation of 2 MD's agreement:  Yes Code 44 added to claim:  Yes    Garret Reddish, RN 06/26/2023, 12:53 PM

## 2023-06-26 NOTE — Progress Notes (Signed)
 Informed pt's husband via voice mail, pt will be staying overnight for observation in ICU

## 2023-06-26 NOTE — Plan of Care (Signed)
   Problem: Clinical Measurements: Goal: Will remain free from infection Outcome: Progressing Goal: Diagnostic test results will improve Outcome: Progressing Goal: Respiratory complications will improve Outcome: Progressing Goal: Cardiovascular complication will be avoided Outcome: Progressing   Problem: Activity: Goal: Risk for activity intolerance will decrease Outcome: Progressing

## 2023-06-26 NOTE — Evaluation (Addendum)
 Clinical/Bedside Swallow Evaluation Patient Details  Name: Sharon [name] MRN: [id number] Date of Birth: 09-30-1950  Today's Date: 06/26/2023 Time: SLP Start Time (ACUTE ONLY): 1550 SLP Stop Time (ACUTE ONLY): 1640 SLP Time Calculation (min) (ACUTE ONLY): 50 min  Past Medical History:  Past Medical History:  Diagnosis Date   Angio-edema    Anxiety    Cataract    CHF (congestive heart failure) (HCC)    Chronic heart failure with preserved ejection fraction (HCC)    Essential hypertension    GERD (gastroesophageal reflux disease)    Hyperlipidemia    Hypothyroidism    SVT (supraventricular tachycardia) (HCC)    Tracheostomy in place [facility]) 01/2023   Urinary incontinence    Vitamin B 12 deficiency    Vitamin D deficiency    Past Surgical History:  Past Surgical History:  Procedure Laterality Date   CATARACT EXTRACTION W/ INTRAOCULAR LENS IMPLANT Right 12/29/2016   Dr. Mia Creek   CATARACT EXTRACTION W/ INTRAOCULAR LENS IMPLANT Left 01/15/2017   Dr. Marcial Pacas Bevis   COLONOSCOPY     INTUBATION-ENDOTRACHEAL WITH TRACHEOSTOMY STANDBY N/A 02/01/2023   Procedure: INTUBATION-ENDOTRACHEAL WITH TRACHEOSTOMY STANDBY;  Surgeon: Linus Salmons, MD;  Location: [facility] ORS;  Service: ENT;  Laterality: N/A;   LEEP     PEG TUBE PLACEMENT  01/2023   PEG TUBE REMOVAL  04/2023   TRACHEOSTOMY TUBE PLACEMENT N/A 02/06/2023   Procedure: TRACHEOSTOMY;  Surgeon: Bud Face, MD;  Location: [facility] ORS;  Service: ENT;  Laterality: N/A;   TUBAL LIGATION     VEIN LIGATION AND STRIPPING     x2   HPI:  Pt is a 73 y.o. female with PMHx significant for recurrent angioedema requiring permanent Tracheostomy on February 06, 2023, who is admitted foor Tracheostomy revision. Husband stated "it(trach) fell out twice".   Past medical history includes Alphagan allergy and recurrent episodes of angioedema requiring Tracheostomy for airway protection, GERD, obesity, CHF, HTN, HLD, hypothyroidism, and SVT.    Head CT:  No evidence of an acute infarct or intracranial hemorrhage.  2. Moderate ventriculomegaly, query communicating/normal pressure  hydrocephalus.  3. Mild chronic small vessel ischemic disease.   CXR: No consolidation, pneumothorax or effusion. No edema. Slight left  midlung presumed scar atelectasis.    Assessment / Plan / Recommendation  Clinical Impression   Pt seen for PMV assessment and BSE this pm. Pt was awake, aphonic secondary to trachostomy but mouthed for all communication; followed all instructions. OF NOTE: when able to wear the PMV for several seconds, pt verbally communicated appropriately. Husband arrived during session. Pt A/O x4.  Pt continues on TC O2 support s/p Tracheostomy Revision this morning by ENT(ENT arrived at end of session).   PMV Eval: Explained the use and wear of the PMV to pt; trach and stoma area inspected; ensured Cuff was deflated b/f attempting finger occlusion. Pt was unable to redirect airflow superiorly for vocalization but instead mouthed. W/ continued finger occlusion attempt, she felt inhalation/exhalation(breathing) was restricted. Attempted placement of the PMV next. Pt's respiratory expiratory effort appeared min increased; again pt stated it felt "difficult" to exhale, to breath comfortably. PMV was removed, and pt endorsed a more comfortable inhalation/exhalation respiratory pattern.  RR: 19-21, O2 sats 99-100%, HR upper 80s before/during/post session. FiO2 50% at 10L per chart.  Shiley #6, Cuffed; cuff deflated at baseline. Noted min-mod bloody secretions/phlegm at stoma/trach during coughing, which were cleaned.   Pt appears to unable to comfortably tolerate PMV placement today w/out overt respiratory discomfort.  Suspect impact from recent tracheostomy/surgery this AM; secretions. Much education was given on PMV use/wear, MUST have Cuff deflation for PMV wear, checking and removing the air from the balloon b/f placing, placing/removing the PMV, and  care of the PMV. Discussed that it should be worn for all eating/drinking; and can be worn w/ Therapies. MUST NOT be worn when sleeping. Will discuss further tomorrow.   NSG/MD updated.      BSE:  Pt seen for assessment of swallowing. She is unable to wear the PMV comfortably at this time for verbal communication, nor for po intake. See session noted above.    Pt explained general aspiration precautions and agreed verbally to the need for following them especially sitting upright for all oral intake and Reducing Distractions/No Talking w/ food and drink in mouth.   Pt appears to present w/ functional oropharyngeal phase swallowing w/ No overt, clinical s/s of aspiration noted during oral intake; no neuromuscular deficits noted. Pt consumed po trials w/ no decline in respiratory function/status during/post oral intake. Pt was unable to wear the PMV for po intake/trials this session.  Pt assisted w/ sitting up and using pillows behind back for a forward position. Pt given trials of thin liquids, ice chips, and purees/softened solids. No overt, clinical s/s of aspiration were noted w/ any consistency; No coughing, respiratory status remained at her Baseline, O2 sats 99-100%; HR 89. Pt fed self w/ setup support. Oral phase appeared [facility] for bolus management, mastication, and timely A-P transfer for swallowing. Oral clearing achieved w/ all consistencies. Pt has natural Dentition for mastication.  OM Exam appeared [facility] w/ no unilateral weakness.   Discussed pt's presentation w/ pt/Husband then MD/NSG. Recommendation was made to initiate an oral diet w/ general aspiration precautions; ongoing assessment of use of PMV in hopes to wear w/ oral intake/meals when able to.   Recommend a Regular diet w/ gravies added to moisten foods; Thin liquids. Recommend general aspiration precautions; Pills WHOLE in Puree for safer swallowing. Tray setup and positioning assistance Upright for meals. ST services will f/u w/ pt  for toleration of diet and education on precautions while admitted. NSG/MD agreed. Precautions posted at bedside, chart. Menu provided to pt/Husband.  SLP Visit Diagnosis: Dysphagia, unspecified (R13.10) (Tracheostomy present w/out PMV placed)    Aspiration Risk  Mild aspiration risk;Risk for inadequate nutrition/hydration (reduced when following precautions)    Diet Recommendation   Thin;Age appropriate regular (cut/moistened foods) = a Regular diet w/ gravies added to moisten foods; Thin liquids. Recommend general aspiration precautions. Tray setup and positioning assistance Upright for meals.  Medication Administration: Whole meds with puree    Other  Recommendations Recommended Consults:  (ENT following) Oral Care Recommendations: Oral care BID;Patient independent with oral care    Recommendations for follow up therapy are one component of a multi-disciplinary discharge planning process, led by the attending physician.  Recommendations may be updated based on patient status, additional functional criteria and insurance authorization.  Follow up Recommendations No SLP follow up (at d/c)      Assistance Recommended at Discharge  intermittent  Functional Status Assessment Patient has had a recent decline in their functional status and demonstrates the ability to make significant improvements in function in a reasonable and predictable amount of time.  Frequency and Duration min 1 x/week  1 week       Prognosis Prognosis for improved oropharyngeal function: Good Barriers/Prognosis Comment: Tracheostomy present w/out PMV placed(unable to tolerate wear currently)  Swallow Study   General Date of Onset: 06/25/23 HPI: Pt is a 73 y.o. female with PMHx significant for recurrent angioedema requiring permanent Tracheostomy on February 06, 2023, who is admitted foor Tracheostomy revision. Husband stated "it(trach) fell out twice".   Past medical history includes Alphagan allergy and  recurrent episodes of angioedema requiring Tracheostomy for airway protection, GERD, obesity, CHF, HTN, HLD, hypothyroidism, and SVT.   Head CT:  No evidence of an acute infarct or intracranial hemorrhage.  2. Moderate ventriculomegaly, query communicating/normal pressure  hydrocephalus.  3. Mild chronic small vessel ischemic disease.   CXR: No consolidation, pneumothorax or effusion. No edema. Slight left  midlung presumed scar atelectasis. Type of Study: Bedside Swallow Evaluation Previous Swallow Assessment: 2022, 2023 prior to tracheostomy then Diet Prior to this Study: NPO (regular diet at home per report) Temperature Spikes Noted: No (wbc 7.1) Respiratory Status: Trach;Trach Collar (10L) Trach Size and Type: Cuff;#6;Deflated;With PMSV not in place History of Recent Intubation: No Behavior/Cognition: Alert;Cooperative;Pleasant mood Oral Cavity Assessment: Within Functional Limits Oral Care Completed by SLP: Yes Oral Cavity - Dentition: Adequate natural dentition Vision: Functional for self-feeding Self-Feeding Abilities: Able to feed self;Needs set up Patient Positioning: Upright in bed (supported positioning) Baseline Vocal Quality:  (WFL w/ PMV placed but unable to tolerated PMV; aphonic w/out PMV) Volitional Cough: Strong (not as effective w/out PMV placed - more at the trach level) Volitional Swallow: Able to elicit    Oral/Motor/Sensory Function Overall Oral Motor/Sensory Function: Within functional limits   Ice Chips Ice chips: Within functional limits Presentation: Spoon (4 trials) Other Comments: coughed x1 when mouthing/talking w/ trial held orally   Thin Liquid Thin Liquid: Within functional limits Presentation: Cup;Self Fed (~15+ trials) Other Comments: single sips slowly -- reduced distractions/no talking    Nectar Thick Nectar Thick Liquid: Not tested   Honey Thick Honey Thick Liquid: Not tested   Puree Puree: Within functional limits Presentation: Self Fed;Spoon (4  ozs)   Solid     Solid: Within functional limits Presentation: Self Fed;Spoon (8 trials) Other Comments: moistened       Jerilynn Som, MS, CCC-SLP Speech Language Pathologist Rehab Services; [facility] [phone] (ascom) Davinder Haff 06/26/2023,6:06 PM

## 2023-06-26 NOTE — Op Note (Signed)
 06/26/2023  8:12 AM    Sharon [name]  [id number]   Pre-Op Dx: airway obstruction  Post-op Dx: SAME  Proc: Revision tracheostomy  Surg:  Davina Poke  Assistant: Vought  Anes:  GOT  EBL: Less than 10 cc  Comp: None  Findings: Significant anterior neck scarring  Procedure: Bar was identified in the holding area taken the operating room placed in supine position.  After general trach anesthesia the neck was gently extended.  The previous tracheostomy site which was identified.  This is a completely healed over with significant granulation tissue.  A local anesthetic of 1% lidocaine with 1 over thousand in separate effort is used to inject the anterior trachea at the previous site.  A total of 3 cc was used.  The neck was then prepped and draped sterilely.  Short sharp scissors were used to dissect and removed the granulation tissue and scarring.  This was taken down to the anterior wall of the trachea.  A curved hemostat was then used to gently enter the tracheal lumen.  The tracheal spreader was then used to open the previous tracheostomy site up.  This gave excellent visualization of the endotracheal tube.  The endotracheal tube was gently moved outward just above the cuff.  A 4 cuffed Shiley tracheostomy tube was introduced into the airway.  Attempts at ventilation through the #4 were unsuccessful.  Therefore the #4 was removed and a #6 was placed.  With this larger lumen the ventilation was much improved.  A flexible laryngoscope was then introduced through the tracheostomy tube into the trachea and was found to be in excellent position above the carina.  There was no evidence of significant bleeding.  The tracheostomy tube was then sutured in position using interrupted 3-0 nylon.  Soft trach tie was then applied.  The patient was then awakened in the operating room to the cover him in stable condition.    Dispo:   Good  Plan: Plan for overnight admission possible discharge  to home tomorrow.  Davina Poke  06/26/2023 8:12 AM

## 2023-06-26 NOTE — Progress Notes (Signed)
 06/26/2023 4:49 PM  Sharon [name] [id number]  Post-Op Day 0    Temp:  [97 F (36.1 C)-98.8 F (37.1 C)] 98.8 F (37.1 C) (03/04 0937) Pulse Rate:  [68-92] 92 (03/04 1600) Resp:  [10-29] 16 (03/04 1600) BP: (127-165)/(69-93) 158/76 (03/04 1600) SpO2:  [96 %-100 %] 100 % (03/04 1600) FiO2 (%):  [50 %] 50 % (03/04 0856) Weight:  [91.6 kg-93.3 kg] 93.3 kg (03/04 0937),     Intake/Output Summary (Last 24 hours) at 06/26/2023 1649 Last data filed at 06/26/2023 4098 Gross per 24 hour  Intake 300 ml  Output --  Net 300 ml    Results for orders placed or performed during the hospital encounter of 06/26/23 (from the past 24 hours)  MRSA Next Gen by PCR, Nasal     Status: None   Collection Time: 06/26/23  9:40 AM   Specimen: Nasal Mucosa; Nasal Swab  Result Value Ref Range   MRSA by PCR Next Gen NOT DETECTED NOT DETECTED  CBC with Differential/Platelet     Status: Abnormal   Collection Time: 06/26/23  3:24 PM  Result Value Ref Range   WBC 7.6 4.0 - 10.5 K/uL   RBC 3.12 (L) 3.87 - 5.11 MIL/uL   Hemoglobin 7.9 (L) 12.0 - 15.0 g/dL   HCT 11.9 (L) 14.7 - 82.9 %   MCV 84.0 80.0 - 100.0 fL   MCH 25.3 (L) 26.0 - 34.0 pg   MCHC 30.2 30.0 - 36.0 g/dL   RDW 56.2 (H) 13.0 - 86.5 %   Platelets 298 150 - 400 K/uL   nRBC 0.0 0.0 - 0.2 %   Neutrophils Relative % 67 %   Neutro Abs 5.1 1.7 - 7.7 K/uL   Lymphocytes Relative 22 %   Lymphs Abs 1.7 0.7 - 4.0 K/uL   Monocytes Relative 8 %   Monocytes Absolute 0.6 0.1 - 1.0 K/uL   Eosinophils Relative 3 %   Eosinophils Absolute 0.2 0.0 - 0.5 K/uL   Basophils Relative 0 %   Basophils Absolute 0.0 0.0 - 0.1 K/uL   Immature Granulocytes 0 %   Abs Immature Granulocytes 0.03 0.00 - 0.07 K/uL  Protime-INR     Status: None   Collection Time: 06/26/23  3:24 PM  Result Value Ref Range   Prothrombin Time 13.4 11.4 - 15.2 seconds   INR 1.0 0.8 - 1.2  APTT     Status: None   Collection Time: 06/26/23  3:24 PM  Result Value Ref Range   aPTT 25 24 -  36 seconds  Type and screen If need to transfuse blood products please use the blood administration order set     Status: None   Collection Time: 06/26/23  3:24 PM  Result Value Ref Range   ABO/RH(D) A POS    Antibody Screen NEG    Sample Expiration      06/29/2023,2359 Performed at [facility], [address] [place], [place] [address]   Comprehensive metabolic panel     Status: Abnormal   Collection Time: 06/26/23  3:24 PM  Result Value Ref Range   Sodium 137 135 - 145 mmol/L   Potassium 3.9 3.5 - 5.1 mmol/L   Chloride 105 98 - 111 mmol/L   CO2 24 22 - 32 mmol/L   Glucose, Bld 107 (H) 70 - 99 mg/dL   BUN 9 8 - 23 mg/dL   Creatinine, Ser 6.29 0.44 - 1.00 mg/dL   Calcium 9.2 8.9 - 52.8 mg/dL  Total Protein 6.3 (L) 6.5 - 8.1 g/dL   Albumin 3.3 (L) 3.5 - 5.0 g/dL   AST 14 (L) 15 - 41 U/L   ALT 8 0 - 44 U/L   Alkaline Phosphatase 54 38 - 126 U/L   Total Bilirubin 0.7 0.0 - 1.2 mg/dL   GFR, Estimated >16 >10 mL/min   Anion gap 8 5 - 15  Magnesium     Status: None   Collection Time: 06/26/23  3:24 PM  Result Value Ref Range   Magnesium 2.0 1.7 - 2.4 mg/dL  Phosphorus     Status: None   Collection Time: 06/26/23  3:24 PM  Result Value Ref Range   Phosphorus 3.4 2.5 - 4.6 mg/dL    SUBJECTIVE: Doing well awake and alert  OBJECTIVE: Trach site clean and dry balloon deflated  IMPRESSION: Status post revision tracheostomy for persistent angioedema  PLAN: Have consulted transition of care team for tracheostomy tube supplies and teaching.  Anticipate approximately 48 hours in the hospital prior to discharge.  Family is aware.  Likely be able to discharge her from the ICU tomorrow out to the floor.  Once transitional care has been arranged patient may be discharged to home.  Davina Poke 06/26/2023, 4:49 PM

## 2023-06-26 NOTE — Consult Note (Signed)
 NAME:  Sharon [name], MRN:  [id number], DOB:  1950/10/07, LOS: 0 ADMISSION DATE:  06/26/2023, CONSULTATION DATE:  06/26/23 REFERRING MD:  Dr. Jenne Campus, CHIEF COMPLAINT:  Tracheostomy revision   Brief Pt Description / Synopsis:  73 y.o. female with PMHx significant for recurrent angioedema requiring Tracheostomy on February 06, 2023, who is admitted following Tracheostomy revision.  History of Present Illness:  Sharon [name] is a 73 y.o. female with a past medical history of Alphagan allergy and recurrent episodes of angioedema requiring Tracheostomy for airway protection, CHF, HTN, HLD, hypothyroidism, and SVT who presents to [facility] on 06/26/23 for Tracheostomy revision.  She was admitted to ICU post procedure. PCCM asked to consult for assistance with managing multiple medical co-morbidities and if she were to need mechanical ventilation.  Please see "Significant Hospital Events" section below for full detailed hospital course.   Pertinent  Medical History   Past Medical History:  Diagnosis Date   Angio-edema    Anxiety    Cataract    CHF (congestive heart failure) (HCC)    Chronic heart failure with preserved ejection fraction (HCC)    Essential hypertension    GERD (gastroesophageal reflux disease)    Hyperlipidemia    Hypothyroidism    SVT (supraventricular tachycardia) (HCC)    Tracheostomy in place [facility]) 01/2023   Urinary incontinence    Vitamin B 12 deficiency    Vitamin D deficiency     Micro Data:  3/4: MRSA PCR>> negative  Antimicrobials:   Anti-infectives (From admission, onward)    None       Significant Hospital Events: Including procedures, antibiotic start and stop dates in addition to other pertinent events   3/4: Admitted to ICU following Trach revision, PCCM consulted.  Pt refusing lab draws.  Wanting to eat, will have Speech perform official swallow evaluation.  Likely can discharge home tomorrow.  Interim History / Subjective:  As outlined above  under significant hospital events  Objective   Blood pressure (!) 141/79, pulse 85, temperature (!) 97 F (36.1 C), resp. rate 13, height 5\' 6"  (1.676 m), weight 91.6 kg, SpO2 97%.        Intake/Output Summary (Last 24 hours) at 06/26/2023 0857 Last data filed at 06/26/2023 8295 Gross per 24 hour  Intake 300 ml  Output --  Net 300 ml   Filed Weights   06/26/23 6213  Weight: 91.6 kg    Examination: General: Chronically ill-appearing female, sitting in bed, on trach collar, no acute distress HENT: Atraumatic, normocephalic, neck supple, no JVD, newly placed tracheostomy midline clean dry and intact Lungs: Clear breath sounds throughout, even, nonlabored, normal effort Cardiovascular: Regular rate and rhythm, S1-S2, no murmurs, rubs, gallops Abdomen: Soft, nontender, nondistended, no guarding or rebound tenderness, bowel sounds positive x 4 Extremities: Normal bulk and tone, no deformities, no cyanosis, trace edema bilateral lower extremities Neuro: Awake and alert, oriented x 4, moves all extremities to commands, no focal deficits, pupils PERRLA GU: Deferred  Resolved Hospital Problem list     Assessment & Plan:   #Tracheostomy Revision -ENT following, appreciate input ~  site care and assessments as per ENT -Supplemental O2 as needed to maintain O2 sats >92% -Follow intermittent Chest X-ray & ABG as needed -Bronchodilators prn -Pulmonary toilet as able -Will obtain Speech consult for clearance for resuming PO intake  #Hypertension #Chronic HFpEF without acute exacerbation #Hyperlipidemia PMHx: SVT Echocardiogram 06/25/23: LVEF 60-65%, mild LVH, Grade I DD, RV systolic function is normal -Continuous cardiac monitoring -Maintain  MAP >65 -Prn Hydralazine for SBP >170 -Resume home Hydralazine and Metoprolol once able to tolerate PO -Diuresis as BP and renal function permits      Best Practice (right click and "Reselect all SmartList Selections" daily)   Diet/type:  NPO DVT prophylaxis: SCD GI prophylaxis: H2B Lines: N/A Foley:  N/A Code Status:  full code Last date of multidisciplinary goals of care discussion [3/4]  3/4: Pt  updated at bedside on plan of care.  Labs   CBC: No results for input(s): "WBC", "NEUTROABS", "HGB", "HCT", "MCV", "PLT" in the last 168 hours.  Basic Metabolic Panel: No results for input(s): "NA", "K", "CL", "CO2", "GLUCOSE", "BUN", "CREATININE", "CALCIUM", "MG", "PHOS" in the last 168 hours. GFR: CrCl cannot be calculated (Patient's most recent lab result is older than the maximum 21 days allowed.). No results for input(s): "PROCALCITON", "WBC", "LATICACIDVEN" in the last 168 hours.  Liver Function Tests: No results for input(s): "AST", "ALT", "ALKPHOS", "BILITOT", "PROT", "ALBUMIN" in the last 168 hours. No results for input(s): "LIPASE", "AMYLASE" in the last 168 hours. No results for input(s): "AMMONIA" in the last 168 hours.  ABG    Component Value Date/Time   PHART 7.5 (H) 02/07/2023 1414   PCO2ART 29 (L) 02/07/2023 1414   PO2ART 76 (L) 02/07/2023 1414   HCO3 22.6 02/07/2023 1414   ACIDBASEDEF 1.4 04/17/2021 1817   O2SAT 97.3 02/07/2023 1414     Coagulation Profile: No results for input(s): "INR", "PROTIME" in the last 168 hours.  Cardiac Enzymes: No results for input(s): "CKTOTAL", "CKMB", "CKMBINDEX", "TROPONINI" in the last 168 hours.  HbA1C: Hgb A1c MFr Bld  Date/Time Value Ref Range Status  05/15/2023 01:05 PM 6.2 4.6 - 6.5 % Final    Comment:    Glycemic Control Guidelines for People with Diabetes:Non Diabetic:  <6%Goal of Therapy: <7%Additional Action Suggested:  >8%   01/02/2023 10:22 AM 6.2 4.6 - 6.5 % Final    Comment:    Glycemic Control Guidelines for People with Diabetes:Non Diabetic:  <6%Goal of Therapy: <7%Additional Action Suggested:  >8%     CBG: No results for input(s): "GLUCAP" in the last 168 hours.  Review of Systems:   Positives in BOLD: Denies all complaints Gen:  Denies fever, chills, weight change, fatigue, night sweats HEENT: Denies blurred vision, double vision, hearing loss, tinnitus, sinus congestion, rhinorrhea, sore throat, neck stiffness, dysphagia PULM: Denies shortness of breath, cough, sputum production, hemoptysis, wheezing CV: Denies chest pain, edema, orthopnea, paroxysmal nocturnal dyspnea, palpitations GI: Denies abdominal pain, nausea, vomiting, diarrhea, hematochezia, melena, constipation, change in bowel habits GU: Denies dysuria, hematuria, polyuria, oliguria, urethral discharge Endocrine: Denies hot or cold intolerance, polyuria, polyphagia or appetite change Derm: Denies rash, dry skin, scaling or peeling skin change Heme: Denies easy bruising, bleeding, bleeding gums Neuro: Denies headache, numbness, weakness, slurred speech, loss of memory or consciousness   Past Medical History:  She,  has a past medical history of Angio-edema, Anxiety, Cataract, CHF (congestive heart failure) (HCC), Chronic heart failure with preserved ejection fraction (HCC), Essential hypertension, GERD (gastroesophageal reflux disease), Hyperlipidemia, Hypothyroidism, SVT (supraventricular tachycardia) (HCC), Tracheostomy in place [facility]) (01/2023), Urinary incontinence, Vitamin B 12 deficiency, and Vitamin D deficiency.   Surgical History:   Past Surgical History:  Procedure Laterality Date   CATARACT EXTRACTION W/ INTRAOCULAR LENS IMPLANT Right 12/29/2016   Dr. Mia Creek   CATARACT EXTRACTION W/ INTRAOCULAR LENS IMPLANT Left 01/15/2017   Dr. Mia Creek   COLONOSCOPY     INTUBATION-ENDOTRACHEAL WITH TRACHEOSTOMY  STANDBY N/A 02/01/2023   Procedure: INTUBATION-ENDOTRACHEAL WITH TRACHEOSTOMY STANDBY;  Surgeon: Linus Salmons, MD;  Location: [facility] ORS;  Service: ENT;  Laterality: N/A;   LEEP     PEG TUBE PLACEMENT  01/2023   PEG TUBE REMOVAL  04/2023   TRACHEOSTOMY TUBE PLACEMENT N/A 02/06/2023   Procedure: TRACHEOSTOMY;  Surgeon: Bud Face, MD;  Location: [facility] ORS;  Service: ENT;  Laterality: N/A;   TUBAL LIGATION     VEIN LIGATION AND STRIPPING     x2     Social History:   reports that she quit smoking about 27 years ago. Her smoking use included cigarettes. She has never used smokeless tobacco. She reports current alcohol use of about 4.0 standard drinks of alcohol per week. She reports that she does not use drugs.   Family History:  Her family history includes Hypertension in her father and mother; Transient ischemic attack in her mother. There is no history of Colon cancer, Esophageal cancer, Rectal cancer, or Stomach cancer.   Allergies Allergies  Allergen Reactions   Cozaar [Losartan] Swelling   Gelatin Anaphylaxis   Septra [Sulfamethoxazole-Trimethoprim] Anaphylaxis   Zinc Gelatin [Zinc] Anaphylaxis   Alpha-Gal    Amlodipine Besylate     REACTION: edema   Atorvastatin     REACTION: muscle pain   Beef Allergy    Cat Dander    Dust Mite Extract    Neomycin-Bacitracin Zn-Polymyx     REACTION: rash Per pt not sure if from meds or not   Latex      Home Medications  Prior to Admission medications   Medication Sig Start Date End Date Taking? Authorizing Provider  EPINEPHrine 0.3 mg/0.3 mL IJ SOAJ injection Inject 0.3 mg into the muscle as needed.   Yes [provider]  famotidine (PEPCID) 20 MG tablet Take 1 tablet (20 mg total) by mouth 2 (two) times daily for 5 days. 05/29/23 06/26/23 Yes Ward, Baxter Hire N, DO  fluticasone (FLONASE) 50 MCG/ACT nasal spray Place 2 sprays into both nostrils daily as needed for rhinitis.   Yes [provider]  hydrALAZINE (APRESOLINE) 25 MG tablet Take 1 tablet (25 mg total) by mouth every 8 (eight) hours. Patient taking differently: Take 25 mg by mouth 3 (three) times daily. 06/08/23  Yes Tower, Marne A, MD  ipratropium-albuterol (DUONEB) 0.5-2.5 (3) MG/3ML SOLN Take 3 mLs by nebulization every 4 (four) hours as needed. 02/09/23  Yes Erin Fulling, MD   metoprolol tartrate (LOPRESSOR) 25 MG tablet Take 1 tablet (25 mg total) by mouth 2 (two) times daily. 05/30/23 08/28/23 Yes Wittenborn, Deborah, NP  rosuvastatin (CRESTOR) 20 MG tablet Take 20 mg by mouth at bedtime. 05/08/23  Yes [provider]  sertraline (ZOLOFT) 25 MG tablet Take 1 tablet (25 mg total) by mouth daily. 06/08/23  Yes Tower, Audrie Gallus, MD  vitamin B-12 (CYANOCOBALAMIN) 500 MCG tablet Take 500 mcg by mouth daily.   Yes [provider]     Critical care time: 50 minutes     Harlon Ditty, AGACNP-BC [facility] Prefer epic messenger for cross cover needs If after hours, please call E-link

## 2023-06-27 ENCOUNTER — Encounter: Payer: Self-pay | Admitting: Unknown Physician Specialty

## 2023-06-27 ENCOUNTER — Telehealth: Payer: Self-pay | Admitting: Family Medicine

## 2023-06-27 DIAGNOSIS — J988 Other specified respiratory disorders: Secondary | ICD-10-CM

## 2023-06-27 DIAGNOSIS — I5032 Chronic diastolic (congestive) heart failure: Secondary | ICD-10-CM | POA: Diagnosis not present

## 2023-06-27 DIAGNOSIS — J668 Airway disease due to other specific organic dusts: Secondary | ICD-10-CM | POA: Diagnosis not present

## 2023-06-27 DIAGNOSIS — R1310 Dysphagia, unspecified: Secondary | ICD-10-CM | POA: Diagnosis not present

## 2023-06-27 DIAGNOSIS — I110 Hypertensive heart disease with heart failure: Secondary | ICD-10-CM | POA: Diagnosis not present

## 2023-06-27 DIAGNOSIS — Z79899 Other long term (current) drug therapy: Secondary | ICD-10-CM | POA: Diagnosis not present

## 2023-06-27 DIAGNOSIS — E785 Hyperlipidemia, unspecified: Secondary | ICD-10-CM | POA: Diagnosis not present

## 2023-06-27 DIAGNOSIS — D72118 Other hypereosinophilic syndrome: Secondary | ICD-10-CM | POA: Diagnosis not present

## 2023-06-27 DIAGNOSIS — Z91040 Latex allergy status: Secondary | ICD-10-CM | POA: Diagnosis not present

## 2023-06-27 DIAGNOSIS — Z87891 Personal history of nicotine dependence: Secondary | ICD-10-CM | POA: Diagnosis not present

## 2023-06-27 DIAGNOSIS — E039 Hypothyroidism, unspecified: Secondary | ICD-10-CM | POA: Diagnosis not present

## 2023-06-27 DIAGNOSIS — Z930 Tracheostomy status: Secondary | ICD-10-CM | POA: Diagnosis not present

## 2023-06-27 LAB — CBC
HCT: 26.1 % — ABNORMAL LOW (ref 36.0–46.0)
Hemoglobin: 7.9 g/dL — ABNORMAL LOW (ref 12.0–15.0)
MCH: 25.4 pg — ABNORMAL LOW (ref 26.0–34.0)
MCHC: 30.3 g/dL (ref 30.0–36.0)
MCV: 83.9 fL (ref 80.0–100.0)
Platelets: 171 10*3/uL (ref 150–400)
RBC: 3.11 MIL/uL — ABNORMAL LOW (ref 3.87–5.11)
RDW: 17.7 % — ABNORMAL HIGH (ref 11.5–15.5)
WBC: 9.1 10*3/uL (ref 4.0–10.5)
nRBC: 0 % (ref 0.0–0.2)

## 2023-06-27 LAB — MAGNESIUM: Magnesium: 1.9 mg/dL (ref 1.7–2.4)

## 2023-06-27 LAB — RENAL FUNCTION PANEL
Albumin: 3.2 g/dL — ABNORMAL LOW (ref 3.5–5.0)
Anion gap: 8 (ref 5–15)
BUN: 9 mg/dL (ref 8–23)
CO2: 26 mmol/L (ref 22–32)
Calcium: 9 mg/dL (ref 8.9–10.3)
Chloride: 103 mmol/L (ref 98–111)
Creatinine, Ser: 0.96 mg/dL (ref 0.44–1.00)
GFR, Estimated: >60 mL/min (ref >60)
Glucose, Bld: 137 mg/dL — ABNORMAL HIGH (ref 70–99)
Phosphorus: 3 mg/dL (ref 2.5–4.6)
Potassium: 3.8 mmol/L (ref 3.5–5.1)
Sodium: 137 mmol/L (ref 135–145)

## 2023-06-27 MED ORDER — SERTRALINE HCL 50 MG PO TABS
25.0000 mg | ORAL_TABLET | Freq: Every day | ORAL | Status: DC
  Administered 2023-06-27 – 2023-06-28 (×2): 25 mg via ORAL
  Filled 2023-06-27 (×2): qty 1

## 2023-06-27 MED ORDER — SERTRALINE HCL 20 MG/ML PO CONC: 25.0000 mg | Freq: Every day | ORAL | Status: DC

## 2023-06-27 NOTE — Telephone Encounter (Signed)
 Copied from CRM [id number]. Topic: General - Other >> Jun 27, 2023 10:50 AM Fredrich Romans wrote: Reason for CRM: Patients husband returned call from [employer]. He stated that he knows what the  cholesterol medication was for ,but was wondering do she still need to take it.She is currently in the hospital and they are needing to know what medications she takes.

## 2023-06-27 NOTE — Progress Notes (Signed)
 PHARMACY CONSULT NOTE - FOLLOW UP  Pharmacy Consult for Electrolyte Monitoring and Replacement   Recent Labs: Potassium (mmol/L)  Date Value  06/27/2023 3.8   Magnesium (mg/dL)  Date Value  16/01/9603 1.9   Calcium (mg/dL)  Date Value  54/12/8117 9.0   Albumin (g/dL)  Date Value  14/78/2956 3.2 (L)   Phosphorus (mg/dL)  Date Value  21/30/8657 3.0   Sodium (mmol/L)  Date Value  06/27/2023 137   Assessment: BC is a 73 yo female who presented to [facility] for tracheostomy revision complex. Pharmacy was consulted for manage this patient's electrolytes while they're in the ICU for overnight observation.   Goal of Therapy:  Electrolytes WNL  Plan:  No replacement required at this time Monitor electrolytes with AM labs   Lowella Bandy, PharmD 06/27/2023 11:33 AM

## 2023-06-27 NOTE — Progress Notes (Signed)
 Pt refusing labs at this time. Pt educated on the importance of labs. ICU NP notified. Pt AxO. Vitals stable at this time.

## 2023-06-27 NOTE — Progress Notes (Signed)
 06/27/2023 8:11 AM  Clarisa Fling [id number]  Post-Op Day 1    Temp:  [97 F (36.1 C)-100.5 F (38.1 C)] 99.6 F (37.6 C) (03/05 0400) Pulse Rate:  [68-115] 88 (03/05 0700) Resp:  [10-29] 20 (03/05 0700) BP: (127-165)/(63-125) 139/65 (03/05 0700) SpO2:  [96 %-100 %] 100 % (03/05 0700) FiO2 (%):  [30 %-50 %] 30 % (03/05 0400) Weight:  [82.3 kg-93.3 kg] 82.3 kg (03/05 0500),     Intake/Output Summary (Last 24 hours) at 06/27/2023 0811 Last data filed at 06/27/2023 0456 Gross per 24 hour  Intake 660 ml  Output 1600 ml  Net -940 ml    Results for orders placed or performed during the hospital encounter of 06/26/23 (from the past 24 hours)  MRSA Next Gen by PCR, Nasal     Status: None   Collection Time: 06/26/23  9:40 AM   Specimen: Nasal Mucosa; Nasal Swab  Result Value Ref Range   MRSA by PCR Next Gen NOT DETECTED NOT DETECTED  CBC with Differential/Platelet     Status: Abnormal   Collection Time: 06/26/23  3:24 PM  Result Value Ref Range   WBC 7.6 4.0 - 10.5 K/uL   RBC 3.12 (L) 3.87 - 5.11 MIL/uL   Hemoglobin 7.9 (L) 12.0 - 15.0 g/dL   HCT 40.9 (L) 81.1 - 91.4 %   MCV 84.0 80.0 - 100.0 fL   MCH 25.3 (L) 26.0 - 34.0 pg   MCHC 30.2 30.0 - 36.0 g/dL   RDW 78.2 (H) 95.6 - 21.3 %   Platelets 298 150 - 400 K/uL   nRBC 0.0 0.0 - 0.2 %   Neutrophils Relative % 67 %   Neutro Abs 5.1 1.7 - 7.7 K/uL   Lymphocytes Relative 22 %   Lymphs Abs 1.7 0.7 - 4.0 K/uL   Monocytes Relative 8 %   Monocytes Absolute 0.6 0.1 - 1.0 K/uL   Eosinophils Relative 3 %   Eosinophils Absolute 0.2 0.0 - 0.5 K/uL   Basophils Relative 0 %   Basophils Absolute 0.0 0.0 - 0.1 K/uL   Immature Granulocytes 0 %   Abs Immature Granulocytes 0.03 0.00 - 0.07 K/uL  Protime-INR     Status: None   Collection Time: 06/26/23  3:24 PM  Result Value Ref Range   Prothrombin Time 13.4 11.4 - 15.2 seconds   INR 1.0 0.8 - 1.2  APTT     Status: None   Collection Time: 06/26/23  3:24 PM  Result Value Ref Range    aPTT 25 24 - 36 seconds  Type and screen If need to transfuse blood products please use the blood administration order set     Status: None   Collection Time: 06/26/23  3:24 PM  Result Value Ref Range   ABO/RH(D) A POS    Antibody Screen NEG    Sample Expiration      06/29/2023,2359 Performed at [facility], [address] [place], [place] [address]   Comprehensive metabolic panel     Status: Abnormal   Collection Time: 06/26/23  3:24 PM  Result Value Ref Range   Sodium 137 135 - 145 mmol/L   Potassium 3.9 3.5 - 5.1 mmol/L   Chloride 105 98 - 111 mmol/L   CO2 24 22 - 32 mmol/L   Glucose, Bld 107 (H) 70 - 99 mg/dL   BUN 9 8 - 23 mg/dL   Creatinine, Ser 8.46 0.44 - 1.00 mg/dL   Calcium 9.2 8.9 - 10.3  mg/dL   Total Protein 6.3 (L) 6.5 - 8.1 g/dL   Albumin 3.3 (L) 3.5 - 5.0 g/dL   AST 14 (L) 15 - 41 U/L   ALT 8 0 - 44 U/L   Alkaline Phosphatase 54 38 - 126 U/L   Total Bilirubin 0.7 0.0 - 1.2 mg/dL   GFR, Estimated >84 >69 mL/min   Anion gap 8 5 - 15  Magnesium     Status: None   Collection Time: 06/26/23  3:24 PM  Result Value Ref Range   Magnesium 2.0 1.7 - 2.4 mg/dL  Phosphorus     Status: None   Collection Time: 06/26/23  3:24 PM  Result Value Ref Range   Phosphorus 3.4 2.5 - 4.6 mg/dL  Blood gas, venous     Status: Abnormal   Collection Time: 06/26/23  3:27 PM  Result Value Ref Range   pH, Ven 7.53 (H) 7.25 - 7.43   pCO2, Ven 32 (L) 44 - 60 mmHg   pO2, Ven 203 (H) 32 - 45 mmHg   Bicarbonate 26.7 20.0 - 28.0 mmol/L   Acid-Base Excess 4.4 (H) 0.0 - 2.0 mmol/L   O2 Saturation 99.4 %   Patient temperature 37.0    Collection site VENOUS     SUBJECTIVE: Awake and alert eating breakfast this morning.  With some instruction she is able to move some air around the trach to speak.  OBJECTIVE: Janina Mayo site appears intact and patent.  IMPRESSION: Status post revision tracheostomy for recurrent angioedema  PLAN: I have put in a consult for transfer of care for  home health consultation and tracheostomy tube supplies for home use.  Respiratory therapy has been working with family about suctioning techniques.  Speech therapy will be back today to try Passy-Muir valve.  Eventually we can downsize the trach to a cuffless 6 which will also improve her voice.  Anticipate that she can be transferred from the ICU out to the floor today, and once home health and trach teaching has been performed she could be discharged to home.  Patient is in understanding.  She can get out of bed and ambulate today.  Sharon [name] 06/27/2023, 8:11 AM

## 2023-06-27 NOTE — Progress Notes (Signed)
 Report called to [facility], for transfer to room 122B.

## 2023-06-27 NOTE — TOC Progression Note (Addendum)
 Transition of Care [facility]) - Progression Note    Patient Details  Name: Sharon [name] MRN: [id number] Date of Birth: 1950/12/27  Transition of Care [facility]) CM/SW Contact  Garret Reddish, RN Phone Number: 06/27/2023, 3:26 PM  Clinical Narrative:    Chart reviewed.  I have spoken with patient husband Tonjia Parillo.  He informs me that prior to admission  Mrs. Thalman lived at home with him.  He reports that prior to admission Mr. Orpah Clinton reports that patient had been in the hospital from early October till late January.  Noted from October admission patient was discharged to [facility].  Husband informs me that patient has been home since late January.  She reports that patient did have trach but did not have any home health or support coming out to the home.  Noted that husband has had some education for trach care while Mrs. Monaco has been in the hospital.  I have informed Mr. Hecht that [facility] will assist with obtaining trach supplies, home 02, and home health if possible.    I have reached out to several agencies, [address], [place], [place], [place], [place], [place], and [address].  No agency is able to accept home health referral.  I have sent order form to [employer] to provide trach supplies for patient.  Patient will also require home 02 on discharge.    [employer] is working on ordering trach supplies, and home 02 for home use.    I have asked Staff nurse to obtain 02 sats to see if patient qualifies for home 02.    TOC will continue to follow for discharge planning.           Expected Discharge Plan and Services                                               Social Determinants of Health (SDOH) Interventions SDOH Screenings   Food Insecurity: No Food Insecurity (06/26/2023)  Housing: Patient Unable To Answer (06/26/2023)  Transportation Needs: Patient Unable To Answer (06/26/2023)  Utilities: Patient Unable To Answer (06/26/2023)  Alcohol Screen: Low Risk  (02/21/2022)   Depression (PHQ2-9): Low Risk  (05/15/2023)  Financial Resource Strain: Low Risk  (02/21/2022)  Physical Activity: Insufficiently Active (02/21/2022)  Social Connections: Patient Unable To Answer (06/26/2023)  Stress: No Stress Concern Present (02/21/2022)  Tobacco Use: Medium Risk (06/26/2023)    Readmission Risk Interventions     No data to display

## 2023-06-27 NOTE — Telephone Encounter (Signed)
 I would like her to continue it  Can get back on it when discharged from the hospital   Thanks for the heads up

## 2023-06-27 NOTE — Telephone Encounter (Signed)
 Duplicate message, messages pasted on phone note regarding this

## 2023-06-27 NOTE — Progress Notes (Addendum)
 Speech Language Pathology Treatment: Dysphagia;Passy Muir Speaking valve  Patient Details Name: Sharon [name] MRN: [id number] DOB: 03-05-1951 Today's Date: 06/27/2023 Time: 2229-7989 SLP Time Calculation (min) (ACUTE ONLY): 45 min  Assessment / Plan / Recommendation Clinical Impression  Pt seen for ongoing PMV assessment and toleration of oral intake this am. Pt was awake, aphonic secondary to trachostomy but mouthed and/or used finger occlusion for communication. Pt A/O x4. Husband present.  Pt continues on TC O2 support s/p Tracheostomy Revision by ENT yesterday.   PMV tx: Explained the use and wear of the PMV to pt; trach and stoma area inspected; ensured Cuff was deflated b/f attempting finger occlusion. Pt was able to redirect airflow superiorly for vocalization/verbalization. Attempts were brief but effective. Attempted placement of the PMV next. Pt was able to tolerate initial wear of the PMV redirecting airflow and attaining appropriate vocalization for verbal communication. However, as she continued to wear the PMV, noted min respiratory expiratory effort increased; again pt stated it felt "difficult" to exhale, to breath comfortably. PMV was removed, and pt endorsed a more comfortable inhalation/exhalation respiratory pattern w/out the PMV on.  RR: 20, O2 sats 99-100%, HR upper 80s before/during/post session. FiO2 50% at 10L per chart.  Shiley #6, Cuffed; cuff deflated at baseline. Noted min improved secretions/phlegm at stoma/trach.   Pt appears to unable to fully tolerate PMV placement for comfortable inhalation/exhalation today w/out experiencing min respiratory discomfort. Suspect impact from recent tracheostomy/surgery and potential edema, secretions still. Encouraged PMV wear as she feels comfortable but to remove it when fatigued/not comfortable. Much education was given on PMV use/wear, MUST have Cuff deflation for PMV wear, checking and removing the air from the balloon b/f  placing, placing/removing the PMV, not wearing the PMV when drowsy/sleepy, and care of the PMV. Encouraged her to monitor her breathing while wearing and removing it when needing rest from it. Discussed that it should be worn for all eating/drinking when possible; and can be worn w/ discussions w/ MDs/Therapists re: POC.  MD/NSG updated. Pt and Husband agreed; Husband was shown donning/doffing which he stated he was familiar w/. Precautions posted in room, chart.       Dysphagia tx:  Pt seen for ongoing assessment of swallowing and toleration of oral diet. She has not been wearing the PMV w/ meals at this time d/t discomfort. See session noted above. ANS at her Baseline.    Pt explained general aspiration precautions and agreed verbally to the need for following them -- highlighted need to sit upright for all oral intake as she was reclined in the bed. This would help reduce risk for aspiration and also improve breathing.    Pt appears to present w/ functional oropharyngeal phase swallowing w/ No overt, clinical s/s of aspiration noted during oral intake despite not wearing the PMV during oral intake. Pt consumed po trials w/ no decline in respiratory function/status during/post oral intake. Risk for aspiration w/ oral intake appears reduced. Pt assisted w/ sitting up and using pillows behind back for a forward position. Pt consumed trials of thin liquids post finishing her breakfast meal. No overt, clinical s/s of aspiration were noted w/ thin liquids. No coughing, respiratory status remained at her Baseline. Pt fed self w/ setup support. Oral phase appeared [facility] for bolus management and timely A-P transfer for swallowing.    Discussed pt's presentation w/ pt/Husband then MD/NSG. Recommend continue current oral diet w/ general aspiration precautions; ongoing assessment of use of PMV in hopes to wear  w/ oral intake/meals when able to more "normalize" the swallow w/ a tracheostomy.   Recommend a Regular  diet w/ gravies added to moisten foods; Thin liquids. Recommend general aspiration precautions; Pills WHOLE in Puree for safer swallowing. Tray setup and positioning assistance Upright for meals. ST services will f/u w/ pt for toleration of diet and education on precautions while admitted. NSG/MD agreed. Precautions posted at bedside, chart.       HPI HPI: Pt is a 73 y.o. female with PMHx significant for recurrent angioedema requiring permanent Tracheostomy on February 06, 2023, who is admitted foor Tracheostomy revision. Husband stated "it(trach) fell out twice".   Past medical history includes Alphagan allergy and recurrent episodes of angioedema requiring Tracheostomy for airway protection, GERD, obesity, CHF, HTN, HLD, hypothyroidism, and SVT.   Head CT:  No evidence of an acute infarct or intracranial hemorrhage.  2. Moderate ventriculomegaly, query communicating/normal pressure  hydrocephalus.  3. Mild chronic small vessel ischemic disease.   CXR: No consolidation, pneumothorax or effusion. No edema. Slight left  midlung presumed scar atelectasis.      SLP Plan  Continue with current plan of care (w/ PMV management)      Recommendations for follow up therapy are one component of a multi-disciplinary discharge planning process, led by the attending physician.  Recommendations may be updated based on patient status, additional functional criteria and insurance authorization.    Recommendations  Diet recommendations: Regular;Thin liquid Liquids provided via: Cup Medication Administration: Whole meds with puree Supervision: Patient able to self feed Compensations: Minimize environmental distractions;Slow rate;Small sips/bites Postural Changes and/or Swallow Maneuvers: Out of bed for meals;Seated upright 90 degrees;Upright 30-60 min after meal      Patient may use Passy-Muir Speech Valve: Caregiver trained to provide supervision;During PO intake/meals;During all waking hours (remove during  sleep) (when pt feels comfortable for wearing) PMSV Supervision: Intermittent MD: Please consider changing trach tube to : Smaller size;Cuffless (ENT monitoring)          (tbd) Oral care BID;Patient independent with oral care   Set up Supervision/Assistance Dysphagia, unspecified (R13.10);Aphonia (R49.1) (tracheostomy)     Continue with current plan of care (w/ PMV management)        Jerilynn Som, MS, CCC-SLP Speech Language Pathologist Rehab Services; [facility] -  [phone] (ascom) Griffey Nicasio  06/27/2023, 12:48 PM

## 2023-06-27 NOTE — Hospital Course (Addendum)
 Hospital course / significant events:  Sharon [name] is a 73 y.o. female with a past medical history of Alphagan allergy and recurrent episodes of angioedema requiring Tracheostomy for airway protection, CHF, HTN, HLD, hypothyroidism, and SVT who presents to [facility] on 06/26/23 for Tracheostomy revision. 03/04: Admitted to ICU following Trach revision, PCCM consulted. Pt refusing lab draws. Wanting to eat, will have Speech perform official swallow evaluation --> cleared for diet. Per ENT, anticipate obs in hospital 48h then discharge home once trach supplies / home health in place. Pt/husband received trach care education w/ [facility]RT. 03/05: transfer to hospitalist service. TOC working to get her supplies for trach/O2 but unable to confirm these for home - discharge held      Consultants:  ENT  Procedures/Surgeries: 03/04 Tracheostomy revision       ASSESSMENT & PLAN:   #Tracheostomy Revision ENT following Supplemental O2 as needed to maintain O2 sats >92% Awaiting DME / home health    #Hypertension #Chronic HFpEF without acute exacerbation #Hyperlipidemia PMHx: SVT Echocardiogram 06/25/23: LVEF 60-65%, mild LVH, Grade I DD, RV systolic function is normal Continuous cardiac monitoring 24h - ok to dc now  Prn Hydralazine for SBP >170 Resume home Hydralazine and Metoprolol once able to tolerate PO Diuresis as BP and renal function permits    oveweight based on BMI: Body mass index is 29.28 kg/m.  Underweight - under 18  overweight - 25 to 29 obese - 30 or more Class 1 obesity: BMI of 30.0 to 34 Class 2 obesity: BMI of 35.0 to 39 Class 3 obesity: BMI of 40.0 to 49 Super Morbid Obesity: BMI 50-59 Super-super Morbid Obesity: BMI 60+ Significantly low or high BMI is associated with higher medical risk.  Weight management advised as adjunct to other disease management and risk reduction treatments    DVT prophylaxis: lovenox  IV fluids: no continuous IV fluids  Nutrition: regular  diet  Central lines / invasive devices: tracheostomy  Code Status: FULL CODE ACP documentation reviewed:  none on file in [facility]  TOC needs: trach supplies and home health / DME Barriers to dispo / significant pending items:  unable to secure trach supplies and home health / DME today but probably will have all this in place tomorrow per [facility]

## 2023-06-27 NOTE — Progress Notes (Signed)
 This patient, (and husband) have requested to be discharged today. Sharon [name] supplies are being delivered by the company, [employer], today, (except for oxygen) and they feel comfortable with everything they need to do. Confirmed with Lucille Passy in RT that trach teaching is done. Pt has been on room air, walking around room, and sat is 93-95%. After discussion with Dr. Jenne Campus, Dr. Lyn Hollingshead, and Lucendia Herrlich from [facility], the recommendation is to be transferred out of ICU, and be discharged tomorrow once supplies required for home care are confirmed to have been delivered.

## 2023-06-27 NOTE — Progress Notes (Signed)
 Pt transported to room 122B via WC on telemetry accompanied by this RN and pt husband. Handoff completed at bedside with Evaristo Bury and NT.

## 2023-06-27 NOTE — Progress Notes (Signed)
 PROGRESS NOTE    Sharon [name]   ZOX:[id number] DOB: 12/06/1950  DOA: 06/26/2023 Date of Service: 06/27/23 which is hospital day 1  PCP: Judy Pimple, MD    Hospital course / significant events:  Sharon [name] is a 73 y.o. female with a past medical history of Alphagan allergy and recurrent episodes of angioedema requiring Tracheostomy for airway protection, CHF, HTN, HLD, hypothyroidism, and SVT who presents to [facility] on 06/26/23 for Tracheostomy revision. 03/04: Admitted to ICU following Trach revision, PCCM consulted. Pt refusing lab draws. Wanting to eat, will have Speech perform official swallow evaluation --> cleared for diet. Per ENT, anticipate obs in hospital 48h then discharge home once trach supplies / home health in place. Pt/husband received trach care education w/ RRT. 03/05: transfer to hospitalist service. TOC working to get her supplies for trach/O2 but unable to confirm these for home - discharge held      Consultants:  ENT  Procedures/Surgeries: 03/04 Tracheostomy revision       ASSESSMENT & PLAN:   #Tracheostomy Revision ENT following Supplemental O2 as needed to maintain O2 sats >92% Awaiting DME / home health    #Hypertension #Chronic HFpEF without acute exacerbation #Hyperlipidemia PMHx: SVT Echocardiogram 06/25/23: LVEF 60-65%, mild LVH, Grade I DD, RV systolic function is normal Continuous cardiac monitoring 24h - ok to dc now  Prn Hydralazine for SBP >170 Resume home Hydralazine and Metoprolol once able to tolerate PO Diuresis as BP and renal function permits    oveweight based on BMI: Body mass index is 29.28 kg/m.  Underweight - under 18  overweight - 25 to 29 obese - 30 or more Class 1 obesity: BMI of 30.0 to 34 Class 2 obesity: BMI of 35.0 to 39 Class 3 obesity: BMI of 40.0 to 49 Super Morbid Obesity: BMI 50-59 Super-super Morbid Obesity: BMI 60+ Significantly low or high BMI is associated with higher medical risk.  Weight  management advised as adjunct to other disease management and risk reduction treatments    DVT prophylaxis: lovenox  IV fluids: no continuous IV fluids  Nutrition: regular diet  Central lines / invasive devices: tracheostomy  Code Status: FULL CODE ACP documentation reviewed:  none on file in [facility]  TOC needs: trach supplies and home health / DME Barriers to dispo / significant pending items:  unable to secure trach supplies and home health / DME today but probably will have all this in place tomorrow per TOC              Subjective / Brief ROS:  Patient reports feeling better this morning, eager for discharge home  Denies CP/SOB.  Pain controlled.  Denies new weakness.  Tolerating diet.  Reports no concerns w/ urination/defecation.   Family Communication: husband at bedside on rounds     Objective Findings:  Vitals:   06/27/23 0700 06/27/23 0800 06/27/23 0900 06/27/23 0910  BP: 139/65 (!) 156/90  129/74  Pulse: 88 (!) 107 (!) 106 100  Resp: 20 20 19 15   Temp:   98.4 F (36.9 C)   TempSrc:   Oral   SpO2: 100% 100% 100% 100%  Weight:      Height:        Intake/Output Summary (Last 24 hours) at 06/27/2023 1645 Last data filed at 06/27/2023 1014 Gross per 24 hour  Intake 360 ml  Output 1600 ml  Net -1240 ml   Filed Weights   06/26/23 0639 06/26/23 0937 06/27/23 0500  Weight: 91.6 kg 93.3  kg 82.3 kg    Examination:  Physical Exam Constitutional:      General: She is not in acute distress. Cardiovascular:     Rate and Rhythm: Normal rate and regular rhythm.  Pulmonary:     Effort: Pulmonary effort is normal.     Breath sounds: Normal breath sounds.  Abdominal:     Palpations: Abdomen is soft.  Musculoskeletal:     Right lower leg: No edema.     Left lower leg: No edema.  Skin:    General: Skin is warm and dry.  Neurological:     General: No focal deficit present.     Mental Status: She is alert and oriented to person, place, and time.   Psychiatric:        Mood and Affect: Mood normal.        Behavior: Behavior normal.          Scheduled Medications:   Chlorhexidine Gluconate Cloth  6 each Topical Daily   ketorolac  30 mg Intravenous Once   mouth rinse  15 mL Mouth Rinse 4 times per day   pantoprazole (PROTONIX) IV  40 mg Intravenous QHS   sertraline  25 mg Oral Daily    Continuous Infusions:   PRN Medications:  acetaminophen, calcium carbonate, docusate sodium, hydrALAZINE, ipratropium-albuterol, mouth rinse, polyethylene glycol  Antimicrobials from admission:  Anti-infectives (From admission, onward)    None           Data Reviewed:  I have personally reviewed the following...  CBC: Recent Labs  Lab 06/26/23 1524 06/27/23 0955  WBC 7.6 9.1  NEUTROABS 5.1  --   HGB 7.9* 7.9*  HCT 26.2* 26.1*  MCV 84.0 83.9  PLT 298 171   Basic Metabolic Panel: Recent Labs  Lab 06/26/23 1524 06/27/23 0955  NA 137 137  K 3.9 3.8  CL 105 103  CO2 24 26  GLUCOSE 107* 137*  BUN 9 9  CREATININE 0.84 0.96  CALCIUM 9.2 9.0  MG 2.0 1.9  PHOS 3.4 3.0   GFR: Estimated Creatinine Clearance: 57.3 mL/min (by C-G formula based on SCr of 0.96 mg/dL). Liver Function Tests: Recent Labs  Lab 06/26/23 1524 06/27/23 0955  AST 14*  --   ALT 8  --   ALKPHOS 54  --   BILITOT 0.7  --   PROT 6.3*  --   ALBUMIN 3.3* 3.2*   No results for input(s): "LIPASE", "AMYLASE" in the last 168 hours. No results for input(s): "AMMONIA" in the last 168 hours. Coagulation Profile: Recent Labs  Lab 06/26/23 1524  INR 1.0   Cardiac Enzymes: No results for input(s): "CKTOTAL", "CKMB", "CKMBINDEX", "TROPONINI" in the last 168 hours. BNP (last 3 results) No results for input(s): "PROBNP" in the last 8760 hours. HbA1C: No results for input(s): "HGBA1C" in the last 72 hours. CBG: No results for input(s): "GLUCAP" in the last 168 hours. Lipid Profile: No results for input(s): "CHOL", "HDL", "LDLCALC", "TRIG",  "CHOLHDL", "LDLDIRECT" in the last 72 hours. Thyroid Function Tests: No results for input(s): "TSH", "T4TOTAL", "FREET4", "T3FREE", "THYROIDAB" in the last 72 hours. Anemia Panel: No results for input(s): "VITAMINB12", "FOLATE", "FERRITIN", "TIBC", "IRON", "RETICCTPCT" in the last 72 hours. Most Recent Urinalysis On File:     Component Value Date/Time   COLORURINE YELLOW (A) 05/13/2023 0232   APPEARANCEUR HAZY (A) 05/13/2023 0232   APPEARANCEUR Clear 01/15/2023 1302   LABSPEC 1.004 (L) 05/13/2023 0232   PHURINE 6.0 05/13/2023 0232   GLUCOSEU  NEGATIVE 05/13/2023 0232   HGBUR NEGATIVE 05/13/2023 0232   HGBUR large 01/31/2007 1019   BILIRUBINUR Negative 05/22/2023 1158   BILIRUBINUR Negative 01/15/2023 1302   KETONESUR NEGATIVE 05/13/2023 0232   PROTEINUR Negative 05/22/2023 1158   PROTEINUR NEGATIVE 05/13/2023 0232   UROBILINOGEN 0.2 05/22/2023 1158   UROBILINOGEN 0.2 01/31/2007 1019   NITRITE Negative 05/22/2023 1158   NITRITE NEGATIVE 05/13/2023 0232   LEUKOCYTESUR Negative 05/22/2023 1158   LEUKOCYTESUR MODERATE (A) 05/13/2023 0232   Sepsis Labs: @LABRCNTIP (procalcitonin:4,lacticidven:4) Microbiology: Recent Results (from the past 240 hours)  MRSA Next Gen by PCR, Nasal     Status: None   Collection Time: 06/26/23  9:40 AM   Specimen: Nasal Mucosa; Nasal Swab  Result Value Ref Range Status   MRSA by PCR Next Gen NOT DETECTED NOT DETECTED Final    Comment: (NOTE) The GeneXpert MRSA Assay (FDA approved for NASAL specimens only), is one component of a comprehensive MRSA colonization surveillance program. It is not intended to diagnose MRSA infection nor to guide or monitor treatment for MRSA infections. Test performance is not FDA approved in patients less than 51 years old. Performed at [facility], [address] [place], [place] [address]       Radiology Studies last 3 days: [facility] Chest [facility] 1 View Result Date: 06/26/2023 CLINICAL DATA:  Status post  tracheostomy revision EXAM: PORTABLE CHEST 1 VIEW COMPARISON:  X-ray 02/04/2023. FINDINGS: Placement of a tracheostomy tube with tip seen proximally 5.5 cm above the carina. Underinflation. Normal cardiopericardial silhouette when adjusting for level of inflation. Calcified aorta. No consolidation, pneumothorax or effusion. No edema. Slight left midlung presumed scar atelectasis. Overlapping cardiac leads. IMPRESSION: Placement of a tracheostomy tube. Slight left midlung scar or atelectasis. Electronically Signed   By: Karen Kays M.D.   On: 06/26/2023 11:07   ECHOCARDIOGRAM COMPLETE Result Date: 06/25/2023    ECHOCARDIOGRAM REPORT   Patient Name:   Sharon [name] Date of Exam: 06/25/2023 Medical Rec #:  [id number]         Height:       66.0 in Accession #:    [id number]        Weight:       206.0 lb Date of Birth:  12-19-1950         BSA:          2.025 m Patient Age:    72 years          BP:           150/80 mmHg Patient Gender: F                 HR:           70 bpm. Exam Location:  [place] Procedure: 2D Echo, Cardiac Doppler and Color Doppler (Both Spectral and Color            Flow Doppler were utilized during procedure). Indications:    I50.23 Acute on chronic systolic (congestive) heart failure  History:        Patient has prior history of Echocardiogram examinations, most                 recent 01/12/2022. CHF, Arrythmias:Atrial Fibrillation and                 Tachycardia, Signs/Symptoms:Edema and Shortness of Breath; Risk                 Factors:Hypertension, Dyslipidemia and Former Smoker.  Sonographer:  Ilda Mori MHA, BS, RDCS Referring Phys: [id number] [facility]  Sonographer Comments: Suboptimal apical window and patient is obese. Image acquisition challenging due to patient body habitus and Declined IV for UEA. IMPRESSIONS  1. Left ventricular ejection fraction, by estimation, is 60 to 65%. The left ventricle has normal function. The left ventricle has no regional wall motion  abnormalities. There is mild left ventricular hypertrophy. Left ventricular diastolic parameters are consistent with Grade I diastolic dysfunction (impaired relaxation).  2. Right ventricular systolic function is normal. The right ventricular size is normal.  3. The mitral valve is normal in structure. No evidence of mitral valve regurgitation.  4. The aortic valve is tricuspid. Aortic valve regurgitation is not visualized. Aortic valve sclerosis/calcification is present, without any evidence of aortic stenosis.  5. The inferior vena cava is normal in size with greater than 50% respiratory variability, suggesting right atrial pressure of 3 mmHg. FINDINGS  Left Ventricle: Left ventricular ejection fraction, by estimation, is 60 to 65%. The left ventricle has normal function. The left ventricle has no regional wall motion abnormalities. Strain was performed and the global longitudinal strain is indeterminate. The left ventricular internal cavity size was normal in size. There is mild left ventricular hypertrophy. Left ventricular diastolic parameters are consistent with Grade I diastolic dysfunction (impaired relaxation). Right Ventricle: The right ventricular size is normal. No increase in right ventricular wall thickness. Right ventricular systolic function is normal. Left Atrium: Left atrial size was normal in size. Right Atrium: Right atrial size was normal in size. Pericardium: There is no evidence of pericardial effusion. Mitral Valve: The mitral valve is normal in structure. No evidence of mitral valve regurgitation. Tricuspid Valve: The tricuspid valve is normal in structure. Tricuspid valve regurgitation is not demonstrated. Aortic Valve: The aortic valve is tricuspid. Aortic valve regurgitation is not visualized. Aortic valve sclerosis/calcification is present, without any evidence of aortic stenosis. Aortic valve mean gradient measures 3.0 mmHg. Aortic valve peak gradient measures 5.7 mmHg. Pulmonic Valve:  The pulmonic valve was normal in structure. Pulmonic valve regurgitation is not visualized. Aorta: The aortic root is normal in size and structure. Venous: The inferior vena cava is normal in size with greater than 50% respiratory variability, suggesting right atrial pressure of 3 mmHg. IAS/Shunts: No atrial level shunt detected by color flow Doppler. Additional Comments: 3D was performed not requiring image post processing on an independent workstation and was indeterminate.  LEFT VENTRICLE PLAX 2D LVIDd:         3.80 cm Diastology LVIDs:         3.10 cm LV e' medial:    3.92 cm/s LV PW:         1.10 cm LV E/e' medial:  13.8 LV IVS:        1.10 cm LV e' lateral:   4.03 cm/s                        LV E/e' lateral: 13.4  RIGHT VENTRICLE RV S prime:     13.70 cm/s TAPSE (M-mode): 2.3 cm LEFT ATRIUM             Index LA diam:        4.50 cm 2.22 cm/m LA Vol (A2C):   23.0 ml 11.36 ml/m LA Vol (A4C):   62.9 ml 31.06 ml/m LA Biplane Vol: 39.6 ml 19.55 ml/m  AORTIC VALVE AV Vmax:           119.00 cm/s AV  Vmean:          83.400 cm/s AV VTI:            0.254 m AV Peak Grad:      5.7 mmHg AV Mean Grad:      3.0 mmHg LVOT Vmax:         79.70 cm/s LVOT Vmean:        53.500 cm/s LVOT VTI:          0.168 m LVOT/AV VTI ratio: 0.66  AORTA Ao Root diam: 3.20 cm Ao Asc diam:  3.40 cm MITRAL VALVE MV Area (PHT): 3.65 cm    SHUNTS MV Decel Time: 208 msec    Systemic VTI: 0.17 m MV E velocity: 54.00 cm/s MV A velocity: 77.00 cm/s MV E/A ratio:  0.70 Debbe Odea MD Electronically signed by Debbe Odea MD Signature Date/Time: 06/25/2023/4:46:52 PM    Final        Time spent: 50 min     Sunnie Nielsen, DO [facility] 06/27/2023, 4:45 PM    Dictation software may have been used to generate the above note. Typos may occur and escape review in typed/dictated notes. Please contact Dr Lyn Hollingshead directly for clarity if needed.  Staff may message me via secure chat in Epic  but this may not receive an  immediate response,  please page me for urgent matters!  If 7PM-7AM, please contact night coverage [email or web address]

## 2023-06-28 DIAGNOSIS — R1310 Dysphagia, unspecified: Secondary | ICD-10-CM | POA: Diagnosis not present

## 2023-06-28 DIAGNOSIS — I110 Hypertensive heart disease with heart failure: Secondary | ICD-10-CM | POA: Diagnosis not present

## 2023-06-28 DIAGNOSIS — I5032 Chronic diastolic (congestive) heart failure: Secondary | ICD-10-CM | POA: Diagnosis not present

## 2023-06-28 DIAGNOSIS — E785 Hyperlipidemia, unspecified: Secondary | ICD-10-CM | POA: Diagnosis not present

## 2023-06-28 DIAGNOSIS — E039 Hypothyroidism, unspecified: Secondary | ICD-10-CM | POA: Diagnosis not present

## 2023-06-28 DIAGNOSIS — Z91040 Latex allergy status: Secondary | ICD-10-CM | POA: Diagnosis not present

## 2023-06-28 DIAGNOSIS — Z87891 Personal history of nicotine dependence: Secondary | ICD-10-CM | POA: Diagnosis not present

## 2023-06-28 DIAGNOSIS — Z79899 Other long term (current) drug therapy: Secondary | ICD-10-CM | POA: Diagnosis not present

## 2023-06-28 DIAGNOSIS — D72118 Other hypereosinophilic syndrome: Secondary | ICD-10-CM | POA: Diagnosis not present

## 2023-06-28 DIAGNOSIS — Z930 Tracheostomy status: Secondary | ICD-10-CM | POA: Diagnosis not present

## 2023-06-28 DIAGNOSIS — J668 Airway disease due to other specific organic dusts: Secondary | ICD-10-CM | POA: Diagnosis not present

## 2023-06-28 NOTE — Progress Notes (Signed)
 Per Dr Wynonia Musty tele monitoring

## 2023-06-28 NOTE — Plan of Care (Signed)
  Problem: Education: Goal: Knowledge of General Education information will improve Description: Including pain rating scale, medication(s)/side effects and non-pharmacologic comfort measures Outcome: Progressing   Problem: Pain Managment: Goal: General experience of comfort will improve and/or be controlled Outcome: Progressing

## 2023-06-28 NOTE — Progress Notes (Signed)
 Transferred to [facility] this AM without difficulties. Pt and husband notified of PT consult at 0825,due to weakness during nightshift. Sharon [name] does not wish to wait for PT, requesting to be discharged. Husband comfortable to resume care at home.

## 2023-06-28 NOTE — Progress Notes (Signed)
 PHARMACY CONSULT NOTE - FOLLOW UP  Pharmacy Consult for Electrolyte Monitoring and Replacement   Recent Labs: Potassium (mmol/L)  Date Value  06/27/2023 3.8   Magnesium (mg/dL)  Date Value  60/45/4098 1.9   Calcium (mg/dL)  Date Value  11/91/4782 9.0   Albumin (g/dL)  Date Value  95/62/1308 3.2 (L)   Phosphorus (mg/dL)  Date Value  65/78/4696 3.0   Sodium (mmol/L)  Date Value  06/27/2023 137   Assessment: BC is a 73 yo female who presented to [facility] for tracheostomy revision complex. Pharmacy was consulted for manage this patient's electrolytes while they're in the ICU for overnight observation.   Goal of Therapy:  Electrolytes WNL  Plan:  No replacement required at this time Pharmacy will sign off given patient is no longer in the ICU  Merryl Hacker, PharmD 06/28/2023 7:20 AM

## 2023-06-28 NOTE — Discharge Summary (Signed)
 06/28/2023 8:19 AM  Sharon [name] [id number]  Post-Op Day 2    Temp:  [98.2 F (36.8 C)-99.7 F (37.6 C)] 98.2 F (36.8 C) (03/06 0759) Pulse Rate:  [95-119] 99 (03/06 0759) Resp:  [15-19] 18 (03/06 0759) BP: (129-157)/(74-87) 154/82 (03/06 0759) SpO2:  [94 %-100 %] 99 % (03/06 0759) FiO2 (%):  [28 %] 28 % (03/06 0408) Weight:  [80.8 kg] 80.8 kg (03/06 0500),     Intake/Output Summary (Last 24 hours) at 06/28/2023 0819 Last data filed at 06/28/2023 0420 Gross per 24 hour  Intake 120 ml  Output --  Net 120 ml    Results for orders placed or performed during the hospital encounter of 06/26/23 (from the past 24 hours)  CBC     Status: Abnormal   Collection Time: 06/27/23  9:55 AM  Result Value Ref Range   WBC 9.1 4.0 - 10.5 K/uL   RBC 3.11 (L) 3.87 - 5.11 MIL/uL   Hemoglobin 7.9 (L) 12.0 - 15.0 g/dL   HCT 40.9 (L) 81.1 - 91.4 %   MCV 83.9 80.0 - 100.0 fL   MCH 25.4 (L) 26.0 - 34.0 pg   MCHC 30.3 30.0 - 36.0 g/dL   RDW 78.2 (H) 95.6 - 21.3 %   Platelets 171 150 - 400 K/uL   nRBC 0.0 0.0 - 0.2 %  Renal function panel     Status: Abnormal   Collection Time: 06/27/23  9:55 AM  Result Value Ref Range   Sodium 137 135 - 145 mmol/L   Potassium 3.8 3.5 - 5.1 mmol/L   Chloride 103 98 - 111 mmol/L   CO2 26 22 - 32 mmol/L   Glucose, Bld 137 (H) 70 - 99 mg/dL   BUN 9 8 - 23 mg/dL   Creatinine, Ser 0.86 0.44 - 1.00 mg/dL   Calcium 9.0 8.9 - 57.8 mg/dL   Phosphorus 3.0 2.5 - 4.6 mg/dL   Albumin 3.2 (L) 3.5 - 5.0 g/dL   GFR, Estimated >46 >96 mL/min   Anion gap 8 5 - 15  Magnesium     Status: None   Collection Time: 06/27/23  9:55 AM  Result Value Ref Range   Magnesium 1.9 1.7 - 2.4 mg/dL    SUBJECTIVE:  Doing well transferred to the floor.   OBJECTIVE:  Trache site clean and dry  IMPRESSION:  s/p trache  PLAN:  DC to home.  They now have all of the trache supplies at home.  F/U 1 week  Davina Poke 06/28/2023, 8:19 AM

## 2023-06-28 NOTE — TOC Progression Note (Signed)
 Transition of Care [facility]) - Progression Note    Patient Details  Name: Sharon [name] MRN: [id number] Date of Birth: [birth year]  [facility] CM/SW Contact  Garret Reddish, RN Phone Number: 06/28/2023, 11:01 AM  Clinical Narrative:    Chart reviewed.  Confirmed with [employer] that patient has received trach supplies at home. Patient did not qualify for home 02. Patient and Spouse made aware that home health agency was not able to be obtained.  Husband feels comfortable caring for patient at home.            Expected Discharge Plan and Services         Expected Discharge Date: 06/28/23                                     Social Determinants of Health (SDOH) Interventions SDOH Screenings   Food Insecurity: No Food Insecurity (06/26/2023)  Housing: Patient Unable To Answer (06/26/2023)  Transportation Needs: Patient Unable To Answer (06/26/2023)  Utilities: Patient Unable To Answer (06/26/2023)  Alcohol Screen: Low Risk  (02/21/2022)  Depression (PHQ2-9): Low Risk  (05/15/2023)  Financial Resource Strain: Low Risk  (02/21/2022)  Physical Activity: Insufficiently Active (02/21/2022)  Social Connections: Patient Unable To Answer (06/26/2023)  Stress: No Stress Concern Present (02/21/2022)  Tobacco Use: Medium Risk (06/26/2023)    Readmission Risk Interventions     No data to display

## 2023-06-29 DIAGNOSIS — R002 Palpitations: Secondary | ICD-10-CM | POA: Diagnosis not present

## 2023-06-29 MED ORDER — ROSUVASTATIN CALCIUM 20 MG PO TABS: 20.0000 mg | ORAL_TABLET | Freq: Every day | ORAL | 1 refills | Status: DC

## 2023-06-29 NOTE — Telephone Encounter (Signed)
 Spoke with spouse and he does want pt to restart med. Rx sent to pharmacy

## 2023-07-02 ENCOUNTER — Telehealth: Payer: Self-pay

## 2023-07-02 NOTE — Transitions of Care (Post Inpatient/ED Visit) (Signed)
   07/02/2023  Name: Sharon [name] MRN: [id number] DOB: [birth year]  Today's TOC FU Call Status: Today's TOC FU Call Status:: Successful TOC FU Call Completed TOC FU Call Complete Date: 07/02/23 Patient's Name and Date of Birth confirmed.  Transition Care Management Follow-up Telephone Call Date of Discharge: 06/28/23 Discharge Facility: [facility]) Type of Discharge: Inpatient Admission Primary Inpatient Discharge Diagnosis:: TRACHEOSTOMY REVISON COMPLEX How have you been since you were released from the hospital?: Better Any questions or concerns?: No  Items Reviewed: Did you receive and understand the discharge instructions provided?: Yes Medications obtained,verified, and reconciled?: Yes (Medications Reviewed) Any new allergies since your discharge?: No Dietary orders reviewed?: NA Do you have support at home?: Yes People in Home: significant other  Medications Reviewed Today: Medications Reviewed Today     Reviewed by Leigh Aurora, CMA (Certified Medical Assistant) on 07/02/23 at 1601  Med List Status: <None>   Medication Order Taking? Sig Documenting Provider Last Dose Status Informant  EPINEPHrine 0.3 mg/0.3 mL IJ SOAJ injection [id number] No Inject 0.3 mg into the muscle as needed. [provider] 10/23/2022 Active Spouse/Significant Other  famotidine (PEPCID) 20 MG tablet [id number] No Take 1 tablet (20 mg total) by mouth 2 (two) times daily for 5 days. Ward, Layla Maw, DO 06/26/2023 Expired 06/26/23 2359 Spouse/Significant Other  fluticasone (FLONASE) 50 MCG/ACT nasal spray [id number] No Place 2 sprays into both nostrils daily as needed for rhinitis. [provider] Past Month Active Spouse/Significant Other  hydrALAZINE (APRESOLINE) 25 MG tablet [id number] No Take 1 tablet (25 mg total) by mouth every 8 (eight) hours.  Patient taking differently: Take 25 mg by mouth 3 (three) times daily.   Tower, [place] A, MD 06/26/2023  Morning Active Spouse/Significant Other  ipratropium-albuterol (DUONEB) 0.5-2.5 (3) MG/3ML SOLN [id number] No Take 3 mLs by nebulization every 4 (four) hours as needed. Erin Fulling, MD 06/26/2023 Morning Active Spouse/Significant Other  metoprolol tartrate (LOPRESSOR) 25 MG tablet [id number] No Take 1 tablet (25 mg total) by mouth 2 (two) times daily. Carlos Levering, NP 06/26/2023  5:30 AM Active Spouse/Significant Other  rosuvastatin (CRESTOR) 20 MG tablet [id number]  Take 1 tablet (20 mg total) by mouth at bedtime. Tower, Audrie Gallus, MD  Active   sertraline (ZOLOFT) 25 MG tablet [id number] No Take 1 tablet (25 mg total) by mouth daily. Tower, Audrie Gallus, MD 06/26/2023 Morning Active Spouse/Significant Other  vitamin B-12 (CYANOCOBALAMIN) 500 MCG tablet [id number] No Take 500 mcg by mouth daily. [provider] Past Week Active Spouse/Significant Other            Home Care and Equipment/Supplies: Were Home Health Services Ordered?: NA Any new equipment or medical supplies ordered?: NA  Functional Questionnaire: Do you need assistance with bathing/showering or dressing?: No Do you need assistance with meal preparation?: No Do you need assistance with eating?: No Do you have difficulty maintaining continence: No Do you need assistance with getting out of bed/getting out of a chair/moving?: No Do you have difficulty managing or taking your medications?: No  Follow up appointments reviewed: PCP Follow-up appointment confirmed?: NA Specialist Hospital Follow-up appointment confirmed?: Yes Date of Specialist follow-up appointment?: 07/10/23 Follow-Up Specialty Provider:: Carlos Levering, NP Do you need transportation to your follow-up appointment?: No Do you understand care options if your condition(s) worsen?: Yes-patient verbalized understanding    SIGNATURE  Agnes Lawrence, CMA (AAMA)  [facility]- [facility] Program [phone]

## 2023-07-07 NOTE — Progress Notes (Unsigned)
 Cardiology Clinic Note   Date: 07/10/2023 ID: ELESHIA WOOLEY, DOB 07/25/1950, MRN [id number]  Primary Cardiologist:  Julien Nordmann, MD  Chief Complaint   Sharon [name] is a 73 y.o. female who presents to the clinic today for follow up after testing.   Patient Profile   Sharon [name] is followed by Dr. Mariah Milling for the history outlined below.       Past medical history significant for: Palpitations/SVT. 14-day ZIO 05/28/2023: HR 48 to 145 bpm, average 73 bpm.  Predominantly sinus rhythm.  6 runs of SVT fastest 5 beats max rate 145 bpm, longest 14 beats average rate 118 bpm.  Rare ectopy.  No A-fib noted. Chronic diastolic heart failure. Echo 06/25/2023: EF 60 to 65%.  No RWMA.  Mild LVH.  Grade I DD.  Normal RV size/function.  Aortic valve sclerosis/calcification without stenosis. Hypertension. Hyperlipidemia. Lipid panel 01/02/2023: LDL 46, HDL 61, TG 241, total 144. GERD. Hypothyroidism. Angioedema/alpha gal.  In summary, patient was first evaluated by Dr. Mariah Milling on 09/21/2020 for lightheadedness and presyncope at the request of Dr. Milinda Antis.  Patient reported history of mitral valve prolapse in the 1980s.  She reported episodes of gait instability and lightheadedness.  No documented orthostasis.  It was felt her symptoms were due to general deconditioning gait instability.  Cardiac testing was deferred in favor of beginning a regular walking program.  She was seen in follow-up August 2023 for complaint of palpitations.  She wore a 14-day ZIO which showed a 2 runs of SVT as detailed above.  Echo showed normal LV/RV function as detailed above.  She was started on Lopressor and referred to EP.  Patient was last seen in the office by Dr. Mariah Milling on 05/08/2022 for 49-month follow-up.  She continued to complain of lightheadedness and neurology referral was recommended.  Patient's husband reported lightheadedness worse after intubation with general anesthesia for 4 days.  She did not want to  use cane for stability.  Walking program and physical therapy for gait stability was recommended.  Zetia was added for hyperlipidemia.  Patient underwent hospital admission to [facility] from 02/01/2023 to 02/09/2023 for recurrent angioedema.  She reported gradual tongue swelling and shortness of breath unresponsive to IM epinephrine.  She was taken emergently to the OR for transnasal endotracheal intubation.  She underwent tracheostomy on 02/06/2023.  She was discharged to [facility] on 02/09/2023.  Patient was evaluated in the ED on 05/12/2023 for trach tube dislodgment.  Respiratory therapy cleaned and adjusted tracheostomy with improvement in symptoms.  She was treated for a UTI and discharged follow-up with PCP.  Patient presented to [facility] ED on 05/22/2023 with tongue swelling.  She was treated with steroids and epinephrine with complete resolution of symptoms.  She was discharged home the same day.   Patient was last seen in the office by me on 05/28/2023 for evaluation of tachycardia.  Patient reported palpitations described as heart racing and skipping for years but became concerned when she read in her chart that she has heart failure.  Heart rate stays elevated most of the time generally > 100 bpm with dips into the 90s.  She finds it most bothersome at night when she is trying to sleep.  She reported associated dyspnea.  During her recent hospital admission she had an EKG that demonstrated A-fib.  She was started on Eliquis and carvedilol and 14-day ZIO was ordered.  Unfortunately after starting Eliquis she developed tongue swelling and diarrhea and was treated in the ED.  Given her reaction she was instructed to stop Eliquis and return to prior dose of metoprolol tartrate.  Patient treated in the ED on 06/08/2023 for allergic reaction.  On 06/26/2023 she underwent revision of her tracheostomy.     History of Present Illness    Today, patient is accompanied by her husband. She reports overall she is about the  same. She continues to have palpitations described as heart racing and skipping for years. She denies chest pain, pressure or tightness. She has episodes of dizziness, wooziness, and fatigue that are unchanged from previous. She does PT on her own at home. She ambulates with a walker. She has been working with PCP to stop some medications that were felt to be unnecessary. She as a visit coming up with an allergist to hopefully address her multiple allergies so she can the trach removed eventually. Discussed results of heart monitor and echo in detail. All questions answered.     ROS: All other systems reviewed and are otherwise negative except as noted in History of Present Illness.  EKGs/Labs Reviewed    EKG Interpretation Date/Time:  Tuesday July 10 2023 11:07:33 EDT Ventricular Rate:  72 PR Interval:  162 QRS Duration:  78 QT Interval:  416 QTC Calculation: 455 R Axis:   39  Text Interpretation: Normal sinus rhythm Normal ECG When compared with ECG of 28-May-2023 10:02, Premature ventricular complexes are no longer Present Borderline criteria for Inferior infarct are no longer Present Nonspecific T wave abnormality no longer evident in Inferior leads Nonspecific T wave abnormality no longer evident in Anterior leads Confirmed by Carlos Levering [id number]) on 07/10/2023 11:19:56 AM   06/26/2023: ALT 8; AST 14 06/27/2023: BUN 9; Creatinine, Ser 0.96; Potassium 3.8; Sodium 137   06/27/2023: Hemoglobin 7.9; WBC 9.1   05/15/2023: TSH 2.39    Physical Exam    VS:  BP 122/78 (BP Location: Left Arm, Patient Position: Sitting, Cuff Size: Normal)   Pulse 72   Ht 5\' 6"  (1.676 m)   Wt 200 lb 12.8 oz (91.1 kg)   SpO2 95%   BMI 32.41 kg/m  , BMI Body mass index is 32.41 kg/m.  GEN: Well nourished, well developed, in no acute distress. Neck: No JVD or carotid bruits. Cardiac:  RRR. No murmurs. No rubs or gallops.   Respiratory:  Respirations regular and unlabored. Clear to auscultation without  rales, wheezing or rhonchi. GI: Soft, nontender, nondistended. Extremities: Radials/DP/PT 2+ and equal bilaterally. No clubbing or cyanosis. No edema.   Skin: Warm and dry, no rash. Neuro: Strength intact.  Assessment & Plan   Palpitations/SVT 14-day ZIO February 2025 showed 6 runs of SVT, no A-fib noted.  Patient reports unchanged palpitations. Will not try different AC, as patient had severe reaction to Eliquis and monitor did not show afib. HR 72 today. EKG today shows NSR with no ectopy.  -Continue metoprolol.   Chronic diastolic heart failure Echo March 2025 showed normal LV/RV function, mild LVH, Grade I DD, no significant valvular abnormalities.  Patient reports lower extremity edema.  No orthopnea or PND.  She does have some associated dyspnea with palpitations which is unchanged from previous. Euvolemic and well compensated on exam.  -Continue hydralazine, metoprolol.   Hypertension BP today 122/78. She has chronic lightheadedness and dizziness.  -Continue hydralazine, metoprolol.  Disposition: Return in 6 months or sooner as needed.          Signed, Etta Grandchild. Breonna Gafford, DNP, NP-C

## 2023-07-08 DIAGNOSIS — R002 Palpitations: Secondary | ICD-10-CM

## 2023-07-09 ENCOUNTER — Other Ambulatory Visit: Payer: Self-pay | Admitting: Family Medicine

## 2023-07-10 ENCOUNTER — Ambulatory Visit: Payer: Medicare Other | Attending: Student | Admitting: Student

## 2023-07-10 ENCOUNTER — Encounter: Payer: Self-pay | Admitting: Student

## 2023-07-10 VITALS — BP 122/78 | HR 72 | Ht 66.0 in | Wt 200.8 lb

## 2023-07-10 DIAGNOSIS — I471 Supraventricular tachycardia, unspecified: Secondary | ICD-10-CM

## 2023-07-10 DIAGNOSIS — I10 Essential (primary) hypertension: Secondary | ICD-10-CM | POA: Diagnosis not present

## 2023-07-10 DIAGNOSIS — R002 Palpitations: Secondary | ICD-10-CM | POA: Diagnosis not present

## 2023-07-10 DIAGNOSIS — I5032 Chronic diastolic (congestive) heart failure: Secondary | ICD-10-CM

## 2023-07-10 NOTE — Patient Instructions (Signed)
 Medication Instructions:  Your Physician recommend you continue on your current medication as directed.    *If you need a refill on your cardiac medications before your next appointment, please call your pharmacy*   Lab Work: None ordered at this time    Follow-Up: At [facility], you and your health needs are our priority.  As part of our continuing mission to provide you with exceptional heart care, we have created designated Provider Care Teams.  These Care Teams include your primary Cardiologist (physician) and Advanced Practice Providers (APPs -  Physician Assistants and Nurse Practitioners) who all work together to provide you with the care you need, when you need it.   Your next appointment:   6 month(s)  Provider:   You may see Julien Nordmann, MD or Carlos Levering, NP

## 2023-07-10 NOTE — Telephone Encounter (Signed)
 Looks like 10 tabs was given in hospital on 05/29/23, please advise

## 2023-07-12 ENCOUNTER — Other Ambulatory Visit: Payer: Self-pay | Admitting: Family Medicine

## 2023-07-12 DIAGNOSIS — Z430 Encounter for attention to tracheostomy: Secondary | ICD-10-CM | POA: Diagnosis not present

## 2023-07-18 DIAGNOSIS — T783XXA Angioneurotic edema, initial encounter: Secondary | ICD-10-CM | POA: Diagnosis not present

## 2023-07-18 DIAGNOSIS — Z91018 Allergy to other foods: Secondary | ICD-10-CM | POA: Diagnosis not present

## 2023-07-19 ENCOUNTER — Telehealth: Payer: Self-pay | Admitting: Family Medicine

## 2023-07-19 MED ORDER — FAMOTIDINE 20 MG PO TABS: 20.0000 mg | ORAL_TABLET | Freq: Two times a day (BID) | ORAL | 2 refills | Status: AC

## 2023-07-19 NOTE — Telephone Encounter (Signed)
 Please ask pt what is going on with sertraline? What side effects? Thanks

## 2023-07-19 NOTE — Addendum Note (Signed)
 Addended by: Roxy Manns A on: 07/19/2023 02:05 PM   Modules accepted: Orders

## 2023-07-19 NOTE — Telephone Encounter (Signed)
 Prescription Request  07/19/2023  LOV: 05/15/2023  What is the name of the medication or equipment? famotidine (PEPCID) 20 MG tablet   Have you contacted your pharmacy to request a refill? No   Which pharmacy would you like this sent to?  [id number] [name], [place] - [address] [place] [address] Phone: [phone] Fax: [phone]    Patient notified that their request is being sent to the clinical staff for review and that they should receive a response within 2 business days.   Please advise at Mobile [phone] (mobile)    Patient husband Fayrene Fearing also wanted to speak with someone in regards to her sertraline (ZOLOFT) 25 MG tablet. He stated that she is experiencing a lot of side effects from it and they need to change the medication. Please advise. Thank you!

## 2023-07-19 NOTE — Telephone Encounter (Signed)
 See last comment regarding pt's zoloft

## 2023-07-20 NOTE — Telephone Encounter (Signed)
 Spouse notified of Dr. Royden Purl comments and f/u appt scheduled

## 2023-07-20 NOTE — Telephone Encounter (Signed)
 Thanks for letting me know  Anxiety medicines all have fairly similar side effect profiles but it sounds like we need to entertain something new  We will need to talk in some detail to do this Please schedule appointment when able  Thanks for the allergist update also , that sounds promising

## 2023-07-20 NOTE — Telephone Encounter (Signed)
 Spoke with spouse and he said on top of the tongue swelling/ allergic issues pt has been having dizziness, nausea, vomiting, fatigue, sleeping problems and they have discussed this with PCP at last few appts. Pt recently saw a specialist at [facility] and they don't think she has alpha gal they. They gave her a new diet to slowly start eating meat again, but now they are looking into other options that may be causing the allergy issues. They are still running more test. Spouse said when he looked at all of the side eff of the zoloft it listed most of the issues pt is dealing with now. Spouse just wants to have her try something different that doesn't have all the of side effs as the Zoloft list to see if that will help her feel better. Pt has been on zoloft 20+ years but at 1st she was on 100 mg but  has been weaned down to 25 mg of Zoloft now. Spouse said she still does get anxiety and agitation some especially when riding in a car or going out in public so pt does need some med to help but he just wants her to try an alt med

## 2023-07-23 ENCOUNTER — Encounter: Payer: Self-pay | Admitting: Family Medicine

## 2023-07-23 ENCOUNTER — Ambulatory Visit (INDEPENDENT_AMBULATORY_CARE_PROVIDER_SITE_OTHER): Admitting: Family Medicine

## 2023-07-23 VITALS — BP 106/64 | HR 71 | Temp 99.3°F | Ht 66.0 in | Wt 210.0 lb

## 2023-07-23 DIAGNOSIS — T783XXD Angioneurotic edema, subsequent encounter: Secondary | ICD-10-CM | POA: Diagnosis not present

## 2023-07-23 DIAGNOSIS — F411 Generalized anxiety disorder: Secondary | ICD-10-CM

## 2023-07-23 DIAGNOSIS — R42 Dizziness and giddiness: Secondary | ICD-10-CM

## 2023-07-23 DIAGNOSIS — R052 Subacute cough: Secondary | ICD-10-CM

## 2023-07-23 DIAGNOSIS — R4189 Other symptoms and signs involving cognitive functions and awareness: Secondary | ICD-10-CM | POA: Diagnosis not present

## 2023-07-23 DIAGNOSIS — D649 Anemia, unspecified: Secondary | ICD-10-CM

## 2023-07-23 NOTE — Assessment & Plan Note (Signed)
 Noted Hb down to 7.9 in early march after trache revision  Baseline in 10s  ? If this was re checked by allergist  If not-needs re check

## 2023-07-23 NOTE — Assessment & Plan Note (Addendum)
 Seeing new allergist at [facility] ? If not alpha gal after all  Had some testing and planning to gradually re introduce some meat products  Reviewed notes today

## 2023-07-23 NOTE — Patient Instructions (Addendum)
 Stop the sertaline entirely - if you feel bad /worse mood please let me know   I want to see if sleep/ need to sleep /sleepiness change  I want to see if dizziness improves   Monitor everything - mood and symptom wise   If cough does not continue to improve of if you notice a fever (watch your temp) please let us know   Stay as active (physically and mentally) as you can be

## 2023-07-23 NOTE — Assessment & Plan Note (Signed)
 Past depression /anx and panic  Used to take sertraline 200 mg daily with wellbutrin  Now just on sertraiine 25 mg  Anx causes irritability/anger and occational agitation but no depression symptoms   Will try holding this- ? If it adds to dizziness/ brain fog and sedation   Will update after holding for a month   Consider counseling or other therapy if needed

## 2023-07-23 NOTE — Assessment & Plan Note (Signed)
 Wondering if sertraline plays role Will hold it and report back   May be worth re checking cbc as well if not done by allergist / very low hb during trache revision

## 2023-07-23 NOTE — Assessment & Plan Note (Signed)
 Ongoing  Also unrestful sleep  Did have OSA eval planned but that was put off due to trache (which should help)  ? If from sertraline Is better now with lower dose Will stop this and see of brain fog and dizziness and sedation improve

## 2023-07-23 NOTE — Assessment & Plan Note (Signed)
 Had some productive cough s/p trache revision  Per husband improved now  Reassuring resp exam today  Call back and Er precautions noted in detail today

## 2023-07-23 NOTE — Progress Notes (Signed)
 Subjective:    Patient ID: Sharon [name], female    DOB: 1950/08/20, 73 y.o.   MRN: [id number]  HPI  Wt Readings from Last 3 Encounters:  07/23/23 210 lb (95.3 kg)  07/10/23 200 lb 12.8 oz (91.1 kg)  06/28/23 178 lb 2.1 oz (80.8 kg)   33.89 kg/m  Vitals:   07/23/23 1223  BP: 106/64  Pulse: 71  Temp: 99.3 F (37.4 C)  SpO2: 98%    Pt presents for follow up mood Also noted temp 99.3  (was hot when getting ready this am)   No new symptoms in past few days   Has a cough since she had re done trach - 3 weeks Getting better now  Not as productive (had colored phlegm from trache but that is better now)    Some issues with pollen   History of anxiety  Taking zoloft 25 mg (used to be on 200 mg)  Also bupropion in past   Gradually decreased her medication over time  Thought it played a role in some of her symptoms    Has been on medicine for mood about 25 years  Around menopause time   ? What her diagnosis was  Several times in past was dx with depression    Symptoms now : anxiety / no depression  Panic disorder also   Gets agitated when frustrated at times- quick  Some irritability  Also angry   Does not cry  Not a big worrier   Tends to be more tired and sluggish than hyper  Does not sleep well  Can sleep all day long /never feels rested   Was supposed to be tested for sleep apnea but then got the trache      Triggers  Riding on highway   (does not drive currently)   Last time she has mental health counseling - over 20 years ago /in 1990s    Is constantly dizzy  Perhaps some cognitive slow down (has had multiple CT scans to rule out stroke)  Has had cardiac work up   Lab Results  Component Value Date   TSH 2.39 05/15/2023   Labs done when trach was revised   Lab Results  Component Value Date   WBC 9.1 06/27/2023   HGB 7.9 (L) 06/27/2023   HCT 26.1 (L) 06/27/2023   MCV 83.9 06/27/2023   PLT 171 06/27/2023    Lab Results   Component Value Date   NA 137 06/27/2023   K 3.8 06/27/2023   CO2 26 06/27/2023   GLUCOSE 137 (H) 06/27/2023   BUN 9 06/27/2023   CREATININE 0.96 06/27/2023   CALCIUM 9.0 06/27/2023   GFR 75.93 05/15/2023   GFRNONAA >60 06/27/2023        Patient Active Problem List   Diagnosis Date Noted   Airway obstruction 06/26/2023   Tracheostomy tube present (HCC) 06/26/2023   Brain fog 05/15/2023   S/P percutaneous endoscopic gastrostomy (PEG) tube placement (HCC) 05/15/2023   Tracheostomy status (HCC) 05/15/2023   Allergic reaction to alpha-gal 04/04/2023   Chronic diastolic heart failure (HCC) 01/09/2023   Snoring 01/09/2023   Vitamin B12 deficiency 01/09/2023   Current use of proton pump inhibitor 01/01/2023   Angioedema due to angiotensin converting enzyme inhibitor (ACE-I) 02/27/2022   SVT (supraventricular tachycardia) (HCC) 02/22/2022   Urinary incontinence 12/09/2021   Pedal edema 07/26/2021   Angioedema 04/17/2021   Dizziness 08/23/2020   Fatigue 08/23/2020   Prediabetes 12/17/2019   Medicare annual  wellness visit, subsequent 08/28/2018   Cough 01/28/2018   Welcome to Medicare preventive visit 08/22/2016   Obesity 10/05/2014   Anemia 09/24/2013   Palpitations 03/03/2013   Colon cancer screening 03/03/2013   Hypokalemia 10/09/2011   Routine general medical examination at a health care facility 10/02/2011   Vitamin D deficiency disease 12/28/2010   ARTHRALGIA 08/19/2007   Hypothyroidism 01/31/2007   Hyperlipidemia 01/31/2007   GAD (generalized anxiety disorder) 01/31/2007   Essential hypertension 01/31/2007   GERD 01/31/2007   Past Medical History:  Diagnosis Date   Angio-edema    Anxiety    Cataract    CHF (congestive heart failure) (HCC)    Chronic heart failure with preserved ejection fraction (HCC)    Essential hypertension    GERD (gastroesophageal reflux disease)    Hyperlipidemia    Hypothyroidism    SVT (supraventricular tachycardia) (HCC)     Tracheostomy in place (HCC) 01/2023   Urinary incontinence    Vitamin B 12 deficiency    Vitamin D deficiency    Past Surgical History:  Procedure Laterality Date   CATARACT EXTRACTION W/ INTRAOCULAR LENS IMPLANT Right 12/29/2016   Dr. Mia Creek   CATARACT EXTRACTION W/ INTRAOCULAR LENS IMPLANT Left 01/15/2017   Dr. Marcial Pacas Bevis   COLONOSCOPY     INTUBATION-ENDOTRACHEAL WITH TRACHEOSTOMY STANDBY N/A 02/01/2023   Procedure: INTUBATION-ENDOTRACHEAL WITH TRACHEOSTOMY STANDBY;  Surgeon: Linus Salmons, MD;  Location: [facility];  Service: ENT;  Laterality: N/A;   LEEP     PEG TUBE PLACEMENT  01/2023   PEG TUBE REMOVAL  04/2023   TRACHEOSTOMY TUBE PLACEMENT N/A 02/06/2023   Procedure: TRACHEOSTOMY;  Surgeon: Bud Face, MD;  Location: [facility];  Service: ENT;  Laterality: N/A;   TRACHEOSTOMY TUBE PLACEMENT N/A 06/26/2023   Procedure: TRACHEOSTOMY REVISON COMPLEX;  Surgeon: Linus Salmons, MD;  Location: [facility];  Service: ENT;  Laterality: N/A;   TUBAL LIGATION     VEIN LIGATION AND STRIPPING     x2   Social History   Tobacco Use   Smoking status: Former    Current packs/day: 0.00    Types: Cigarettes    Quit date: 04/24/1996    Years since quitting: 27.2   Smokeless tobacco: Never  Vaping Use   Vaping status: Never Used  Substance Use Topics   Alcohol use: Yes    Alcohol/week: 4.0 standard drinks of alcohol    Types: 4 Shots of liquor per week    Comment: occasional   Drug use: No   Family History  Problem Relation Age of Onset   Transient ischemic attack Mother    Hypertension Mother    Hypertension Father    Colon cancer Neg Hx    Esophageal cancer Neg Hx    Rectal cancer Neg Hx    Stomach cancer Neg Hx    Allergies  Allergen Reactions   Cozaar [Losartan] Swelling   Eliquis [Apixaban] Swelling   Gelatin Anaphylaxis   Septra [Sulfamethoxazole-Trimethoprim] Anaphylaxis   Zinc Gelatin [Zinc] Anaphylaxis   Alpha-Gal    Amlodipine Besylate      REACTION: edema   Atorvastatin     REACTION: muscle pain   Beef Allergy    Cat Dander    Dust Mite Extract    Neomycin-Bacitracin Zn-Polymyx     REACTION: rash Per pt not sure if from meds or not   Latex    Current Outpatient Medications on File Prior to Visit  Medication Sig Dispense Refill   EPINEPHrine 0.3 mg/0.3  mL IJ SOAJ injection Inject 0.3 mg into the muscle as needed.     famotidine (PEPCID) 20 MG tablet Take 1 tablet (20 mg total) by mouth 2 (two) times daily. 180 tablet 2   fexofenadine (ALLEGRA) 180 MG tablet Take 360 mg by mouth 2 (two) times daily.     fluticasone (FLONASE) 50 MCG/ACT nasal spray Place 2 sprays into both nostrils daily as needed for rhinitis.     hydrALAZINE (APRESOLINE) 25 MG tablet Take 1 tablet (25 mg total) by mouth every 8 (eight) hours. (Patient taking differently: Take 25 mg by mouth 3 (three) times daily.) 270 tablet 1   ipratropium-albuterol (DUONEB) 0.5-2.5 (3) MG/3ML SOLN Take 3 mLs by nebulization every 4 (four) hours as needed.     metoprolol tartrate (LOPRESSOR) 25 MG tablet Take 1 tablet (25 mg total) by mouth 2 (two) times daily. 180 tablet 1   rosuvastatin (CRESTOR) 20 MG tablet TAKE 1 TABLET BY MOUTH DAILY 90 tablet 1   vitamin B-12 (CYANOCOBALAMIN) 500 MCG tablet Take 500 mcg by mouth daily.     No current facility-administered medications on file prior to visit.    Review of Systems  Constitutional:  Positive for fatigue. Negative for activity change, appetite change, fever and unexpected weight change.  HENT:  Negative for congestion, ear pain, rhinorrhea, sinus pressure and sore throat.   Eyes:  Negative for pain, redness and visual disturbance.  Respiratory:  Positive for cough. Negative for shortness of breath and wheezing.   Cardiovascular:  Negative for chest pain and palpitations.       Palpitations are improved   Gastrointestinal:  Negative for abdominal pain, blood in stool, constipation and diarrhea.  Endocrine: Negative  for polydipsia and polyuria.  Genitourinary:  Negative for dysuria, frequency and urgency.  Musculoskeletal:  Negative for arthralgias, back pain and myalgias.  Skin:  Negative for pallor and rash.  Allergic/Immunologic: Negative for environmental allergies.  Neurological:  Negative for dizziness, syncope and headaches.  Hematological:  Negative for adenopathy. Does not bruise/bleed easily.  Psychiatric/Behavioral:  Positive for decreased concentration and sleep disturbance. Negative for behavioral problems, confusion, dysphoric mood, hallucinations and suicidal ideas. The patient is nervous/anxious.        Occational feels agitated if she gets frustrated by something        Objective:   Physical Exam Constitutional:      General: She is not in acute distress.    Appearance: Normal appearance. She is well-developed. She is obese. She is not ill-appearing or diaphoretic.  HENT:     Head: Normocephalic and atraumatic.     Mouth/Throat:     Mouth: Mucous membranes are moist.     Pharynx: Oropharynx is clear.  Eyes:     General: No scleral icterus.       Right eye: No discharge.        Left eye: No discharge.     Conjunctiva/sclera: Conjunctivae normal.     Pupils: Pupils are equal, round, and reactive to light.  Neck:     Thyroid: No thyromegaly.     Vascular: No carotid bruit or JVD.     Comments: Trache in place  Cardiovascular:     Rate and Rhythm: Normal rate and regular rhythm.     Heart sounds: Normal heart sounds.     No gallop.  Pulmonary:     Effort: Pulmonary effort is normal. No respiratory distress.     Breath sounds: Normal breath sounds. No wheezing or rales.  Abdominal:     General: There is no distension or abdominal bruit.     Palpations: Abdomen is soft.  Musculoskeletal:     Cervical back: Normal range of motion and neck supple.     Right lower leg: No edema.     Left lower leg: No edema.  Lymphadenopathy:     Cervical: No cervical adenopathy.  Skin:     General: Skin is warm and dry.     Coloration: Skin is not pale.     Findings: No rash.     Comments: Fair   Neurological:     Mental Status: She is alert.     Coordination: Coordination normal.     Deep Tendon Reflexes: Reflexes are normal and symmetric. Reflexes normal.  Psychiatric:        Attention and Perception: Attention normal.        Mood and Affect: Mood normal. Affect is blunt.        Cognition and Memory: Cognition normal.     Comments: Candidly discusses symptoms and stressors    Affect is somewhat blunted/baseline   Does not remember time in hospital            Assessment & Plan:   Problem List Items Addressed This Visit       Other   GAD (generalized anxiety disorder) - Primary   Past depression /anx and panic  Used to take sertraline 200 mg daily with wellbutrin  Now just on sertraiine 25 mg  Anx causes irritability/anger and occational agitation but no depression symptoms   Will try holding this- ? If it adds to dizziness/ brain fog and sedation   Will update after holding for a month   Consider counseling or other therapy if needed       Dizziness   Wondering if sertraline plays role Will hold it and report back   May be worth re checking cbc as well if not done by allergist / very low hb during trache revision       Cough   Had some productive cough s/p trache revision  Per husband improved now  Reassuring resp exam today  Call back and Er precautions noted in detail today        Brain fog   Ongoing  Also unrestful sleep  Did have OSA eval planned but that was put off due to trache (which should help)  ? If from sertraline Is better now with lower dose Will stop this and see of brain fog and dizziness and sedation improve      Angioedema   Seeing new allergist at [facility] ? If not alpha gal after all  Had some testing and planning to gradually re introduce some meat products  Reviewed notes today      Anemia   Noted Hb down to  7.9 in early march after trache revision  Baseline in 10s  ? If this was re checked by allergist  If not-needs re check

## 2023-07-24 ENCOUNTER — Telehealth: Payer: Self-pay | Admitting: *Deleted

## 2023-07-24 DIAGNOSIS — D649 Anemia, unspecified: Secondary | ICD-10-CM

## 2023-07-24 DIAGNOSIS — I10 Essential (primary) hypertension: Secondary | ICD-10-CM

## 2023-07-24 NOTE — Telephone Encounter (Signed)
 Spoke with spouse and advised him of Dr. Royden Purl comments. He said the allergist only did testing to see what she is allergic too. Spouse was okay bringing pt back in for lab appt. Appt scheduled 07/26/23, please place orders

## 2023-07-24 NOTE — Telephone Encounter (Signed)
-----   Message from [facility] sent at 07/23/2023  1:22 PM EDT ----- I went back and reviewed old labs when they left along with allergist note  Her Hb was lower than usual after the trache revision - I was wondering if her allergist re checked that ?  If not I want to set up some labs Anemia could cause worse fatigue and cold feeling, thanks

## 2023-07-24 NOTE — Telephone Encounter (Signed)
The orders are in.

## 2023-07-26 ENCOUNTER — Other Ambulatory Visit (INDEPENDENT_AMBULATORY_CARE_PROVIDER_SITE_OTHER)

## 2023-07-26 ENCOUNTER — Encounter: Payer: Self-pay | Admitting: Unknown Physician Specialty

## 2023-07-26 ENCOUNTER — Encounter: Payer: Self-pay | Admitting: Family Medicine

## 2023-07-26 ENCOUNTER — Telehealth: Payer: Self-pay

## 2023-07-26 DIAGNOSIS — I10 Essential (primary) hypertension: Secondary | ICD-10-CM

## 2023-07-26 DIAGNOSIS — D649 Anemia, unspecified: Secondary | ICD-10-CM

## 2023-07-26 DIAGNOSIS — D509 Iron deficiency anemia, unspecified: Secondary | ICD-10-CM

## 2023-07-26 LAB — CBC WITH DIFFERENTIAL/PLATELET
Basophils Absolute: 0.1 10*3/uL (ref 0.0–0.1)
Basophils Relative: 0.7 % (ref 0.0–3.0)
Eosinophils Absolute: 0.5 10*3/uL (ref 0.0–0.7)
Eosinophils Relative: 6.2 % — ABNORMAL HIGH (ref 0.0–5.0)
HCT: 25 % — ABNORMAL LOW (ref 36.0–46.0)
Hemoglobin: 7.7 g/dL — CL (ref 12.0–15.0)
Lymphocytes Relative: 31.3 % (ref 12.0–46.0)
Lymphs Abs: 2.3 10*3/uL (ref 0.7–4.0)
MCHC: 30.9 g/dL (ref 30.0–36.0)
MCV: 72.8 fl — ABNORMAL LOW (ref 78.0–100.0)
Monocytes Absolute: 0.6 10*3/uL (ref 0.1–1.0)
Monocytes Relative: 8.1 % (ref 3.0–12.0)
Neutro Abs: 4 10*3/uL (ref 1.4–7.7)
Neutrophils Relative %: 53.7 % (ref 43.0–77.0)
Platelets: 153 10*3/uL (ref 150.0–400.0)
RBC: 3.43 Mil/uL — ABNORMAL LOW (ref 3.87–5.11)
RDW: 19.1 % — ABNORMAL HIGH (ref 11.5–15.5)
WBC: 7.4 10*3/uL (ref 4.0–10.5)

## 2023-07-26 LAB — COMPREHENSIVE METABOLIC PANEL WITH GFR
ALT: 5 U/L (ref 0–35)
AST: 13 U/L (ref 0–37)
Albumin: 3.8 g/dL (ref 3.5–5.2)
Alkaline Phosphatase: 62 U/L (ref 39–117)
BUN: 9 mg/dL (ref 6–23)
CO2: 27 meq/L (ref 19–32)
Calcium: 9.3 mg/dL (ref 8.4–10.5)
Chloride: 104 meq/L (ref 96–112)
Creatinine, Ser: 0.9 mg/dL (ref 0.40–1.20)
GFR: 63.86 mL/min (ref >60.00)
Glucose, Bld: 99 mg/dL (ref 70–99)
Potassium: 4 meq/L (ref 3.5–5.1)
Sodium: 137 meq/L (ref 135–145)
Total Bilirubin: 0.4 mg/dL (ref 0.2–1.2)
Total Protein: 6.7 g/dL (ref 6.0–8.3)

## 2023-07-26 LAB — IRON: Iron: 21 ug/dL — ABNORMAL LOW (ref 42–145)

## 2023-07-26 LAB — FERRITIN: Ferritin: 5.1 ng/mL — ABNORMAL LOW (ref 10.0–291.0)

## 2023-07-26 MED ORDER — POLYSACCHARIDE IRON COMPLEX 150 MG PO CAPS: 150.0000 mg | ORAL_CAPSULE | Freq: Two times a day (BID) | ORAL | 1 refills | Status: DC

## 2023-07-26 NOTE — Telephone Encounter (Signed)
Aware, see result note 

## 2023-07-26 NOTE — Telephone Encounter (Signed)
-----   Message from [facility] sent at 07/26/2023  3:29 PM EDT ----- Spouse notified of lab results and Dr. Royden Purl comments. Pt is only taking B12 supplements, no iron. She has never had an iron or blood transfusion. Spouse said pt hasn't mentioned having any blood in stool but he will double check with her and if she has he will send a message back to let us know, but he is pretty sure she hasn't had any

## 2023-07-26 NOTE — Telephone Encounter (Signed)
 CRITICAL VALUE STICKER  CRITICAL VALUE: Hemoglobin 7.7  RECEIVER (on-site recipient of call): Shekela Goodridge   DATE & TIME NOTIFIED: 04/03 12:00  MESSENGER (representative from lab): Hope   MD NOTIFIED: Dr. Milinda Antis  TIME OF NOTIFICATION: 12:05

## 2023-07-26 NOTE — Telephone Encounter (Signed)
 I sent in niferex to start bid with food   If it causes constipation take miralax and /or stool softener over the counter   I also would like to refer to hematology to see if you would qualify for iron infusions since this is so low  Let me know if that is ok  Also what location you prefer   Thanks  Then will make plan re: when to re check

## 2023-07-27 NOTE — Telephone Encounter (Signed)
 Spoke with spouse advise of med and Rx sent to pharmacy. He will get med today. They are okay with referral. He would like pt to see someone in [place] if possible. I advised him if he hasn't received a call within the next few days to let us know

## 2023-08-07 ENCOUNTER — Encounter: Payer: Self-pay | Admitting: Oncology

## 2023-08-07 ENCOUNTER — Inpatient Hospital Stay: Attending: Oncology | Admitting: Oncology

## 2023-08-07 ENCOUNTER — Inpatient Hospital Stay

## 2023-08-07 VITALS — BP 148/88 | HR 76 | Temp 97.7°F | Resp 20 | Ht 66.0 in | Wt 207.0 lb

## 2023-08-07 DIAGNOSIS — E538 Deficiency of other specified B group vitamins: Secondary | ICD-10-CM | POA: Diagnosis not present

## 2023-08-07 DIAGNOSIS — D509 Iron deficiency anemia, unspecified: Secondary | ICD-10-CM | POA: Insufficient documentation

## 2023-08-07 DIAGNOSIS — D508 Other iron deficiency anemias: Secondary | ICD-10-CM

## 2023-08-07 DIAGNOSIS — Z79899 Other long term (current) drug therapy: Secondary | ICD-10-CM | POA: Insufficient documentation

## 2023-08-07 DIAGNOSIS — I10 Essential (primary) hypertension: Secondary | ICD-10-CM | POA: Diagnosis not present

## 2023-08-07 DIAGNOSIS — D649 Anemia, unspecified: Secondary | ICD-10-CM | POA: Diagnosis not present

## 2023-08-07 NOTE — Progress Notes (Signed)
 Hematology/Oncology Consult note [facility] [phone] [phone]  Patient Care Team: Tower, Audrie Gallus, MD as PCP - General (Family Medicine) Antonieta Iba, MD as PCP - Cardiology (Cardiology) Creig Hines, MD as Consulting Physician (Hematology and Oncology)   Name of the patient: Sharon [name]  [id number]  Sep 01, 1950    Reason for referral-iron deficiency anemia   Referring physician-Dr. Milinda Antis  Date of visit: 08/07/23   History of presenting illness-patient is a 73 year old female with a past medical history significant for hypertension hyperlipidemia hypothyroidism, history of CHF Who has been referred for iron deficiency anemia.  Labs from 07/26/2023 showed white count of 7.4, H&H of 7.7/25 with an MCV of 72.8 and a platelet count of 153.  Ferritin levels were low at 5.1.  Last colonoscopy was 10 years ago which shows diverticulosis in the sigmoid colon.  B12 levels from January 2025 normal at 434.  She denies any blood loss in her stool or urine.  Denies any dark melanotic stools.  Denies any vaginal bleeding.  She has been taking oral iron without any side effects.  Patient has had history of anaphylaxis and throat swelling to multiple medications and she is still getting workup for the same.  Because of multiple hospitalizations she has required a tracheostomy to protect her airway.  ECOG PS- 2  Pain scale- 0   Review of systems- Review of Systems  Constitutional:  Positive for malaise/fatigue. Negative for chills, fever and weight loss.  HENT:  Negative for congestion, ear discharge and nosebleeds.   Eyes:  Negative for blurred vision.  Respiratory:  Negative for cough, hemoptysis, sputum production, shortness of breath and wheezing.   Cardiovascular:  Negative for chest pain, palpitations, orthopnea and claudication.  Gastrointestinal:  Negative for abdominal pain, blood in stool, constipation, diarrhea, heartburn, melena,  nausea and vomiting.  Genitourinary:  Negative for dysuria, flank pain, frequency, hematuria and urgency.  Musculoskeletal:  Negative for back pain, joint pain and myalgias.  Skin:  Negative for rash.  Neurological:  Positive for dizziness. Negative for tingling, focal weakness, seizures, weakness and headaches.  Endo/Heme/Allergies:  Does not bruise/bleed easily.  Psychiatric/Behavioral:  Negative for depression and suicidal ideas. The patient does not have insomnia.     Allergies  Allergen Reactions   Cozaar [Losartan] Swelling   Eliquis [Apixaban] Swelling   Gelatin Anaphylaxis   Septra [Sulfamethoxazole-Trimethoprim] Anaphylaxis   Zinc Gelatin [Zinc] Anaphylaxis   Alpha-Gal    Amlodipine Besylate     REACTION: edema   Atorvastatin     REACTION: muscle pain   Beef Allergy    Cat Dander    Dust Mite Extract    Neomycin-Bacitracin Zn-Polymyx     REACTION: rash Per pt not sure if from meds or not   Latex     Patient Active Problem List   Diagnosis Date Noted   Airway obstruction 06/26/2023   Tracheostomy tube present (HCC) 06/26/2023   Brain fog 05/15/2023   S/P percutaneous endoscopic gastrostomy (PEG) tube placement (HCC) 05/15/2023   Tracheostomy status (HCC) 05/15/2023   Allergic reaction to alpha-gal 04/04/2023   Chronic diastolic heart failure (HCC) 01/09/2023   Snoring 01/09/2023   Vitamin B12 deficiency 01/09/2023   Current use of proton pump inhibitor 01/01/2023   Angioedema due to angiotensin converting enzyme inhibitor (ACE-I) 02/27/2022   SVT (supraventricular tachycardia) (HCC) 02/22/2022   Urinary incontinence 12/09/2021   Pedal edema 07/26/2021   Angioedema 04/17/2021   Dizziness 08/23/2020   Fatigue  08/23/2020   Prediabetes 12/17/2019   Medicare annual wellness visit, subsequent 08/28/2018   Cough 01/28/2018   Welcome to Medicare preventive visit 08/22/2016   Obesity 10/05/2014   Anemia 09/24/2013   Palpitations 03/03/2013   Colon cancer  screening 03/03/2013   Hypokalemia 10/09/2011   Routine general medical examination at a health care facility 10/02/2011   Vitamin D deficiency disease 12/28/2010   ARTHRALGIA 08/19/2007   Hypothyroidism 01/31/2007   Hyperlipidemia 01/31/2007   GAD (generalized anxiety disorder) 01/31/2007   Essential hypertension 01/31/2007   GERD 01/31/2007     Past Medical History:  Diagnosis Date   Angio-edema    Anxiety    Cataract    CHF (congestive heart failure) (HCC)    Chronic heart failure with preserved ejection fraction (HCC)    Essential hypertension    GERD (gastroesophageal reflux disease)    Hyperlipidemia    Hypothyroidism    SVT (supraventricular tachycardia) (HCC)    Tracheostomy in place (HCC) 01/2023   Urinary incontinence    Vitamin B 12 deficiency    Vitamin D deficiency      Past Surgical History:  Procedure Laterality Date   CATARACT EXTRACTION W/ INTRAOCULAR LENS IMPLANT Right 12/29/2016   Dr. Ronni Colace   CATARACT EXTRACTION W/ INTRAOCULAR LENS IMPLANT Left 01/15/2017   Dr. Emeterio Hansen Bevis   COLONOSCOPY     INTUBATION-ENDOTRACHEAL WITH TRACHEOSTOMY STANDBY N/A 02/01/2023   Procedure: INTUBATION-ENDOTRACHEAL WITH TRACHEOSTOMY STANDBY;  Surgeon: Lesly Raspberry, MD;  Location: [facility];  Service: ENT;  Laterality: N/A;   LEEP     PEG TUBE PLACEMENT  01/2023   PEG TUBE REMOVAL  04/2023   TRACHEOSTOMY TUBE PLACEMENT N/A 02/06/2023   Procedure: TRACHEOSTOMY;  Surgeon: Rogers Clayman, MD;  Location: [facility];  Service: ENT;  Laterality: N/A;   TRACHEOSTOMY TUBE PLACEMENT N/A 06/26/2023   Procedure: TRACHEOSTOMY REVISON COMPLEX;  Surgeon: Lesly Raspberry, MD;  Location: [facility];  Service: ENT;  Laterality: N/A;   TUBAL LIGATION     VEIN LIGATION AND STRIPPING     x2    Social History   Socioeconomic History   Marital status: Married    Spouse name: Royston Cornea   Number of children: 2   Years of education: Not on file   Highest education level: Not on  file  Occupational History   Not on file  Tobacco Use   Smoking status: Former    Current packs/day: 0.00    Types: Cigarettes    Quit date: 04/24/1996    Years since quitting: 27.3   Smokeless tobacco: Never  Vaping Use   Vaping status: Never Used  Substance and Sexual Activity   Alcohol use: Yes    Alcohol/week: 4.0 standard drinks of alcohol    Types: 4 Shots of liquor per week    Comment: occasional   Drug use: No   Sexual activity: Not Currently  Other Topics Concern   Not on file  Social History Narrative   Not on file   Social Drivers of Health   Financial Resource Strain: Low Risk  (02/21/2022)   Overall Financial Resource Strain (CARDIA)    Difficulty of Paying Living Expenses: Not hard at all  Food Insecurity: No Food Insecurity (08/07/2023)   Hunger Vital Sign    Worried About Running Out of Food in the Last Year: Never true    Ran Out of Food in the Last Year: Never true  Transportation Needs: No Transportation Needs (08/07/2023)   PRAPARE - Transportation  Lack of Transportation (Medical): No    Lack of Transportation (Non-Medical): No  Physical Activity: Insufficiently Active (02/21/2022)   Exercise Vital Sign    Days of Exercise per Week: 5 days    Minutes of Exercise per Session: 10 min  Stress: No Stress Concern Present (02/21/2022)   [employer] of Occupational Health - Occupational Stress Questionnaire    Feeling of Stress : Not at all  Social Connections: Patient Unable To Answer (06/26/2023)   Social Connection and Isolation Panel [NHANES]    Frequency of Communication with Friends and Family: Patient unable to answer    Frequency of Social Gatherings with Friends and Family: Patient unable to answer    Attends Religious Services: Patient unable to answer    Active Member of Clubs or Organizations: Patient unable to answer    Attends Banker Meetings: Patient unable to answer    Marital Status: Patient unable to answer  Intimate  Partner Violence: Not At Risk (08/07/2023)   Humiliation, Afraid, Rape, and Kick questionnaire    Fear of Current or Ex-Partner: No    Emotionally Abused: No    Physically Abused: No    Sexually Abused: No     Family History  Problem Relation Age of Onset   Transient ischemic attack Mother    Hypertension Mother    Hypertension Father    Colon cancer Neg Hx    Esophageal cancer Neg Hx    Rectal cancer Neg Hx    Stomach cancer Neg Hx      Current Outpatient Medications:    EPINEPHrine 0.3 mg/0.3 mL IJ SOAJ injection, Inject 0.3 mg into the muscle as needed., Disp: , Rfl:    famotidine (PEPCID) 20 MG tablet, Take 1 tablet (20 mg total) by mouth 2 (two) times daily., Disp: 180 tablet, Rfl: 2   fexofenadine (ALLEGRA) 180 MG tablet, Take 360 mg by mouth 2 (two) times daily., Disp: , Rfl:    fluticasone (FLONASE) 50 MCG/ACT nasal spray, Place 2 sprays into both nostrils daily as needed for rhinitis., Disp: , Rfl:    hydrALAZINE (APRESOLINE) 25 MG tablet, Take 1 tablet (25 mg total) by mouth every 8 (eight) hours. (Patient taking differently: Take 25 mg by mouth 3 (three) times daily.), Disp: 270 tablet, Rfl: 1   ipratropium-albuterol (DUONEB) 0.5-2.5 (3) MG/3ML SOLN, Take 3 mLs by nebulization every 4 (four) hours as needed., Disp: , Rfl:    iron polysaccharides (NIFEREX) 150 MG capsule, Take 1 capsule (150 mg total) by mouth 2 (two) times daily., Disp: 60 capsule, Rfl: 1   metoprolol tartrate (LOPRESSOR) 25 MG tablet, Take 1 tablet (25 mg total) by mouth 2 (two) times daily., Disp: 180 tablet, Rfl: 1   rosuvastatin (CRESTOR) 20 MG tablet, TAKE 1 TABLET BY MOUTH DAILY, Disp: 90 tablet, Rfl: 1   vitamin B-12 (CYANOCOBALAMIN) 500 MCG tablet, Take 500 mcg by mouth daily., Disp: , Rfl:    Physical exam:  Vitals:   08/07/23 1112  BP: (!) 148/88  Pulse: 76  Resp: 20  Temp: 97.7 F (36.5 C)  TempSrc: Tympanic  SpO2: 99%  Weight: 207 lb (93.9 kg)  Height: 5\' 6"  (1.676 m)   Physical  Exam Neck:     Comments: trachesotomy Cardiovascular:     Rate and Rhythm: Normal rate and regular rhythm.     Heart sounds: Normal heart sounds.  Pulmonary:     Effort: Pulmonary effort is normal.     Breath sounds: Normal breath  sounds.  Abdominal:     General: Bowel sounds are normal.     Palpations: Abdomen is soft.  Skin:    General: Skin is warm and dry.  Neurological:     Mental Status: She is alert and oriented to person, place, and time.           Latest Ref Rng & Units 07/26/2023    9:53 AM  CMP  Glucose 70 - 99 mg/dL 99   BUN 6 - 23 mg/dL 9   Creatinine 4.09 - 8.11 mg/dL 9.14   Sodium 782 - 956 mEq/L 137   Potassium 3.5 - 5.1 mEq/L 4.0   Chloride 96 - 112 mEq/L 104   CO2 19 - 32 mEq/L 27   Calcium 8.4 - 10.5 mg/dL 9.3   Total Protein 6.0 - 8.3 g/dL 6.7   Total Bilirubin 0.2 - 1.2 mg/dL 0.4   Alkaline Phos 39 - 117 U/L 62   AST 0 - 37 U/L 13   ALT 0 - 35 U/L 5       Latest Ref Rng & Units 07/26/2023    9:53 AM  CBC  WBC 4.0 - 10.5 K/uL 7.4   Hemoglobin 12.0 - 15.0 g/dL 7.7 Repeated and verified X2.   Hematocrit 36.0 - 46.0 % 25.0 aL   Platelets 150.0 - 400.0 K/uL 153.0     No images are attached to the encounter.  No results found.  Assessment and plan- Patient is a 73 y.o. female referred for iron deficiency anemia  Patient's hemoglobin was Close to 12 1 year ago and since then it has gradually drifted down and presently at 7.7.  Labs are indicative of iron deficiency.  We will proceed with 5 doses of Venofer and I will plan to give her premedications with Solu-Medrol and cetirizine given her history of allergy and anaphylaxis to multiple medications.  Discussed risks and benefits of IV iron including all but not limited to possible risk of infusion anaphylactic reaction.  Patient understands and agrees to proceed as planned.  CBC ferritin and iron studies and B12 levels in 2 months and I will see her thereafter.  Patient would like to hold off on GI  evaluation at this time given that she has ongoing workup for angioedema going on.  If there is a consistent decrease in her iron and hemoglobin requiring multiple rounds of IV iron she is willing to consider GI evaluation down the line   Thank you for this kind referral and the opportunity to participate in the care of this patient   Visit Diagnosis 1. Other iron deficiency anemia   2. Anemia, unspecified type   3. Essential hypertension   4. Vitamin B12 deficiency     Dr. Seretha Dance, MD, MPH [facility] at [facility] [phone] 08/07/2023

## 2023-08-14 ENCOUNTER — Inpatient Hospital Stay

## 2023-08-14 VITALS — BP 144/67 | HR 76 | Temp 96.3°F | Resp 18

## 2023-08-14 DIAGNOSIS — D509 Iron deficiency anemia, unspecified: Secondary | ICD-10-CM | POA: Diagnosis not present

## 2023-08-14 DIAGNOSIS — Z79899 Other long term (current) drug therapy: Secondary | ICD-10-CM | POA: Diagnosis not present

## 2023-08-14 DIAGNOSIS — E538 Deficiency of other specified B group vitamins: Secondary | ICD-10-CM | POA: Diagnosis not present

## 2023-08-14 DIAGNOSIS — D508 Other iron deficiency anemias: Secondary | ICD-10-CM

## 2023-08-14 MED ORDER — METHYLPREDNISOLONE SODIUM SUCC 125 MG IJ SOLR
40.0000 mg | INTRAMUSCULAR | Status: DC
  Administered 2023-08-14: 40 mg via INTRAVENOUS
  Filled 2023-08-14: qty 2

## 2023-08-14 MED ORDER — IRON SUCROSE 20 MG/ML IV SOLN
200.0000 mg | Freq: Once | INTRAVENOUS | Status: AC
  Administered 2023-08-14: 200 mg via INTRAVENOUS
  Filled 2023-08-14: qty 10

## 2023-08-14 MED ORDER — FAMOTIDINE IN NACL 20-0.9 MG/50ML-% IV SOLN
20.0000 mg | INTRAVENOUS | Status: DC
  Administered 2023-08-14: 20 mg via INTRAVENOUS
  Filled 2023-08-14: qty 50

## 2023-08-15 ENCOUNTER — Ambulatory Visit (INDEPENDENT_AMBULATORY_CARE_PROVIDER_SITE_OTHER)
Admission: RE | Admit: 2023-08-15 | Discharge: 2023-08-15 | Disposition: A | Discharge status: Home / Self Care | Source: Ambulatory Visit | Attending: Family Medicine | Admitting: Family Medicine

## 2023-08-15 ENCOUNTER — Ambulatory Visit (INDEPENDENT_AMBULATORY_CARE_PROVIDER_SITE_OTHER): Admitting: Family Medicine

## 2023-08-15 ENCOUNTER — Encounter: Payer: Self-pay | Admitting: Family Medicine

## 2023-08-15 VITALS — BP 124/78 | HR 94 | Temp 98.5°F | Ht 66.0 in | Wt 206.0 lb

## 2023-08-15 DIAGNOSIS — R059 Cough, unspecified: Secondary | ICD-10-CM | POA: Diagnosis not present

## 2023-08-15 DIAGNOSIS — D509 Iron deficiency anemia, unspecified: Secondary | ICD-10-CM | POA: Diagnosis not present

## 2023-08-15 DIAGNOSIS — R42 Dizziness and giddiness: Secondary | ICD-10-CM

## 2023-08-15 DIAGNOSIS — K219 Gastro-esophageal reflux disease without esophagitis: Secondary | ICD-10-CM

## 2023-08-15 DIAGNOSIS — R5382 Chronic fatigue, unspecified: Secondary | ICD-10-CM

## 2023-08-15 DIAGNOSIS — R052 Subacute cough: Secondary | ICD-10-CM

## 2023-08-15 DIAGNOSIS — M546 Pain in thoracic spine: Secondary | ICD-10-CM | POA: Insufficient documentation

## 2023-08-15 MED ORDER — PANTOPRAZOLE SODIUM 40 MG PO TBEC: 40.0000 mg | DELAYED_RELEASE_TABLET | Freq: Every day | ORAL | 3 refills | Status: DC

## 2023-08-15 MED ORDER — DOXYCYCLINE HYCLATE 100 MG PO TABS: 100.0000 mg | ORAL_TABLET | Freq: Two times a day (BID) | ORAL | 0 refills | Status: DC

## 2023-08-15 MED ORDER — PROMETHAZINE-DM 6.25-15 MG/5ML PO SYRP: 5.0000 mL | ORAL_SOLUTION | Freq: Three times a day (TID) | ORAL | 0 refills | Status: DC | PRN

## 2023-08-15 NOTE — Assessment & Plan Note (Signed)
 In hopes this will improve with iron  infusions / improvement in iron  def

## 2023-08-15 NOTE — Assessment & Plan Note (Signed)
 Worse recently  May be fueling sub acute cough  Recommend avoiding triggers if possible and avoid getting overly full or eating before bed   Will add back protonix  40 mg daily  Continue pepcid   Update if no improvement in next several weeks

## 2023-08-15 NOTE — Assessment & Plan Note (Signed)
 Hb in 7 range most recently  Has seen heme and started oral iron  / also venofer  infusions Expect improvement in energy level and dizziness as this improves Will continue to follow   May need to consider GI work up down the road once stabilized

## 2023-08-15 NOTE — Assessment & Plan Note (Signed)
 Bilateral/ muscular/spasm  No neuro symptoms  Reassuring exam  Some chest wall tenderness on exam  Suspect due to severe cough and straining   Recommend heat as tolerated Gentle stretching  Massage if helpful   Working on cough control to see if this helps   Update if not starting to improve in a week or if worsening  Call back and Er precautions noted in detail today

## 2023-08-15 NOTE — Progress Notes (Signed)
 Subjective:    Patient ID: Sharon [name], female    DOB: Jul 24, 1950, 73 y.o.   MRN: [id number]  HPI  Wt Readings from Last 3 Encounters:  08/15/23 206 lb (93.4 kg)  08/07/23 207 lb (93.9 kg)  07/23/23 210 lb (95.3 kg)   33.25 kg/m  Vitals:   08/15/23 1437  BP: 124/78  Pulse: 94  Temp: 98.5 F (36.9 C)  SpO2: 100%   Pt presents for c/o  Cough  Back pain/spasms  Dizziness   Last visit noted cough since her trache revision 3 wk prior  It was improving then  Had some white mucous from her trache  Getting worse  Not as constant as it was  Now worse when lying down / esp at night  Also when getting up   Mucous has turned thicker / yellow to green  Husband suctions and changes trache tubes  Occational speck of blood  Does not think she has been wheezing  No fever  Clears throat frequently   Tried mucinex dm -did not help    Had heartburn/indigestion all the time  Worse recently  Triggered by sweets      Send message to Dr Silvestre Drum- waiting on a response     Back spasms  On and off throughout the years  Can happen in neck, mid back and low back  Mid back is probably the worst  Comes and goes  Perhaps every 30 minutes  Both sides equally  No radition to limbs  Dull pain usually  Takes tylenol  -no help  Massage helps briefly  Heat and ice don't help (has tried heating bad)  Triggered by movement of any kind  Best when flat on back   Pain is midline today   Per pt history of arthritis in her back   No pain to take a deep breath   Cxr DG Chest 2 View Result Date: 08/15/2023 CLINICAL DATA:  increasing cough /prod of green mucous in pt with a trache EXAM: CHEST - 2 VIEW COMPARISON:  06/26/2023 FINDINGS: Lungs are clear.  Stable tracheostomy device. Heart size and mediastinal contours are within normal limits. Aortic Atherosclerosis (ICD10-170.0). No effusion. Visualized bones unremarkable. IMPRESSION: No acute cardiopulmonary disease.  Electronically Signed   By: Nicoletta Barrier M.D.   On: 08/15/2023 15:59   Reassuring      Dizziness Wondered if sertraline  caused this (along with brain fog)  At last visit instructed to hold it and report back  Also dx with severe anemia (being treated with venofer )  which could add, also on oral iron    GERD Previously on protonix   Now taking pepcid  20 mg bid    BP Readings from Last 3 Encounters:  08/15/23 124/78  08/14/23 (!) 144/67  08/07/23 (!) 148/88   Pulse Readings from Last 3 Encounters:  08/15/23 94  08/14/23 76  08/07/23 76       Patient Active Problem List   Diagnosis Date Noted   Thoracic back pain 08/15/2023   Iron  deficiency anemia 08/07/2023   Airway obstruction 06/26/2023   Tracheostomy tube present (HCC) 06/26/2023   Brain fog 05/15/2023   S/P percutaneous endoscopic gastrostomy (PEG) tube placement (HCC) 05/15/2023   Tracheostomy status (HCC) 05/15/2023   Allergic reaction to alpha-gal 04/04/2023   Chronic diastolic heart failure (HCC) 01/09/2023   Snoring 01/09/2023   Vitamin B12 deficiency 01/09/2023   Current use of proton pump inhibitor 01/01/2023   Angioedema due to angiotensin converting enzyme inhibitor (ACE-I)  02/27/2022   SVT (supraventricular tachycardia) (HCC) 02/22/2022   Urinary incontinence 12/09/2021   Pedal edema 07/26/2021   Angioedema 04/17/2021   Dizziness 08/23/2020   Fatigue 08/23/2020   Prediabetes 12/17/2019   Medicare annual wellness visit, subsequent 08/28/2018   Cough 01/28/2018   Welcome to Medicare preventive visit 08/22/2016   Obesity 10/05/2014   Anemia 09/24/2013   Palpitations 03/03/2013   Colon cancer screening 03/03/2013   Hypokalemia 10/09/2011   Routine general medical examination at a health care facility 10/02/2011   Vitamin D  deficiency disease 12/28/2010   ARTHRALGIA 08/19/2007   Hypothyroidism 01/31/2007   Hyperlipidemia 01/31/2007   GAD (generalized anxiety disorder) 01/31/2007   Essential  hypertension 01/31/2007   GERD 01/31/2007   Past Medical History:  Diagnosis Date   Angio-edema    Anxiety    Cataract    CHF (congestive heart failure) (HCC)    Chronic heart failure with preserved ejection fraction (HCC)    Essential hypertension    GERD (gastroesophageal reflux disease)    Hyperlipidemia    Hypothyroidism    SVT (supraventricular tachycardia) (HCC)    Tracheostomy in place (HCC) 01/2023   Urinary incontinence    Vitamin B 12 deficiency    Vitamin D  deficiency    Past Surgical History:  Procedure Laterality Date   CATARACT EXTRACTION W/ INTRAOCULAR LENS IMPLANT Right 12/29/2016   Dr. Ronni Colace   CATARACT EXTRACTION W/ INTRAOCULAR LENS IMPLANT Left 01/15/2017   Dr. Emeterio Hansen Bevis   COLONOSCOPY     INTUBATION-ENDOTRACHEAL WITH TRACHEOSTOMY STANDBY N/A 02/01/2023   Procedure: INTUBATION-ENDOTRACHEAL WITH TRACHEOSTOMY STANDBY;  Surgeon: Lesly Raspberry, MD;  Location: [facility];  Service: ENT;  Laterality: N/A;   LEEP     PEG TUBE PLACEMENT  01/2023   PEG TUBE REMOVAL  04/2023   TRACHEOSTOMY TUBE PLACEMENT N/A 02/06/2023   Procedure: TRACHEOSTOMY;  Surgeon: Rogers Clayman, MD;  Location: [facility];  Service: ENT;  Laterality: N/A;   TRACHEOSTOMY TUBE PLACEMENT N/A 06/26/2023   Procedure: TRACHEOSTOMY REVISON COMPLEX;  Surgeon: Lesly Raspberry, MD;  Location: [facility];  Service: ENT;  Laterality: N/A;   TUBAL LIGATION     VEIN LIGATION AND STRIPPING     x2   Social History   Tobacco Use   Smoking status: Former    Current packs/day: 0.00    Types: Cigarettes    Quit date: 04/24/1996    Years since quitting: 27.3   Smokeless tobacco: Never  Vaping Use   Vaping status: Never Used  Substance Use Topics   Alcohol use: Yes    Alcohol/week: 4.0 standard drinks of alcohol    Types: 4 Shots of liquor per week    Comment: occasional   Drug use: No   Family History  Problem Relation Age of Onset   Transient ischemic attack Mother    Hypertension  Mother    Hypertension Father    Colon cancer Neg Hx    Esophageal cancer Neg Hx    Rectal cancer Neg Hx    Stomach cancer Neg Hx    Allergies  Allergen Reactions   Cozaar  [Losartan ] Swelling   Eliquis  [Apixaban ] Swelling   Gelatin Anaphylaxis   Septra  [Sulfamethoxazole -Trimethoprim ] Anaphylaxis   Zinc Gelatin [Zinc] Anaphylaxis   Alpha-Gal    Amlodipine Besylate     REACTION: edema   Atorvastatin     REACTION: muscle pain   Beef Allergy    Cat Dander    Dust Mite Extract    Neomycin-Bacitracin Zn-Polymyx  REACTION: rash Per pt not sure if from meds or not   Latex    Current Outpatient Medications on File Prior to Visit  Medication Sig Dispense Refill   EPINEPHrine  0.3 mg/0.3 mL IJ SOAJ injection Inject 0.3 mg into the muscle as needed.     famotidine  (PEPCID ) 20 MG tablet Take 1 tablet (20 mg total) by mouth 2 (two) times daily. 180 tablet 2   fexofenadine (ALLEGRA) 180 MG tablet Take 360 mg by mouth 2 (two) times daily.     fluticasone  (FLONASE ) 50 MCG/ACT nasal spray Place 2 sprays into both nostrils daily as needed for rhinitis.     hydrALAZINE  (APRESOLINE ) 25 MG tablet Take 1 tablet (25 mg total) by mouth every 8 (eight) hours. (Patient taking differently: Take 25 mg by mouth 3 (three) times daily.) 270 tablet 1   ipratropium-albuterol  (DUONEB) 0.5-2.5 (3) MG/3ML SOLN Take 3 mLs by nebulization every 4 (four) hours as needed.     iron  polysaccharides (NIFEREX) 150 MG capsule Take 1 capsule (150 mg total) by mouth 2 (two) times daily. 60 capsule 1   metoprolol  tartrate (LOPRESSOR ) 25 MG tablet Take 1 tablet (25 mg total) by mouth 2 (two) times daily. 180 tablet 1   rosuvastatin  (CRESTOR ) 20 MG tablet TAKE 1 TABLET BY MOUTH DAILY 90 tablet 1   vitamin B-12 (CYANOCOBALAMIN ) 500 MCG tablet Take 500 mcg by mouth daily.     No current facility-administered medications on file prior to visit.    Review of Systems  Constitutional:  Positive for fatigue. Negative for  activity change, appetite change, fever and unexpected weight change.  HENT:  Negative for congestion, ear pain, rhinorrhea, sinus pressure and sore throat.   Eyes:  Negative for pain, redness and visual disturbance.  Respiratory:  Positive for cough. Negative for choking, shortness of breath, wheezing and stridor.        Green mucous from trache  Cardiovascular:  Negative for chest pain and palpitations.  Gastrointestinal:  Negative for abdominal pain, blood in stool, constipation and diarrhea.       Heartburn Indigestion  Much worse   Endocrine: Negative for polydipsia and polyuria.  Genitourinary:  Negative for dysuria, frequency and urgency.  Musculoskeletal:  Positive for back pain. Negative for arthralgias and myalgias.  Skin:  Negative for pallor and rash.  Allergic/Immunologic: Negative for environmental allergies.  Neurological:  Positive for light-headedness. Negative for dizziness, syncope and headaches.  Hematological:  Negative for adenopathy. Does not bruise/bleed easily.  Psychiatric/Behavioral:  Negative for decreased concentration and dysphoric mood. The patient is not nervous/anxious.        Objective:   Physical Exam Constitutional:      General: She is not in acute distress.    Appearance: Normal appearance. She is well-developed. She is obese. She is not ill-appearing or diaphoretic.  HENT:     Head: Normocephalic and atraumatic.     Right Ear: Tympanic membrane and ear canal normal.     Left Ear: Tympanic membrane and ear canal normal.     Nose:     Comments: Boggy nares     Mouth/Throat:     Mouth: Mucous membranes are moist.     Pharynx: Oropharynx is clear.  Eyes:     General: No scleral icterus.       Right eye: No discharge.        Left eye: No discharge.     Conjunctiva/sclera: Conjunctivae normal.     Pupils: Pupils are equal, round,  and reactive to light.  Neck:     Thyroid : No thyromegaly.     Vascular: No carotid bruit or JVD.   Cardiovascular:     Rate and Rhythm: Regular rhythm. Tachycardia present.     Heart sounds: Normal heart sounds.     No gallop.  Pulmonary:     Effort: Pulmonary effort is normal. No respiratory distress.     Breath sounds: Normal breath sounds. No stridor. No wheezing, rhonchi or rales.     Comments: Good air exch Trache appears normal   No wheeze even on forced exp No crackles   Tender chest (AP)  No crepitus or step off of ribs  Chest:     Chest wall: Tenderness present.  Abdominal:     General: There is no distension or abdominal bruit.     Palpations: Abdomen is soft. There is no mass.     Tenderness: There is no abdominal tenderness. There is no guarding or rebound.  Musculoskeletal:     Cervical back: Normal range of motion and neck supple. No tenderness.     Right lower leg: No edema.     Left lower leg: No edema.     Comments: Some tenderness on either side of TS medial to scapula  Some tenderness of chest wall ant-post as well  No crepitus Normal rom    Lymphadenopathy:     Cervical: No cervical adenopathy.  Skin:    General: Skin is warm and dry.     Coloration: Skin is not jaundiced or pale.     Findings: No bruising, erythema or rash.  Neurological:     Mental Status: She is alert.     Coordination: Coordination normal.     Deep Tendon Reflexes: Reflexes are normal and symmetric. Reflexes normal.  Psychiatric:        Mood and Affect: Mood normal.           Assessment & Plan:   Problem List Items Addressed This Visit       Digestive   GERD   Worse recently  May be fueling sub acute cough  Recommend avoiding triggers if possible and avoid getting overly full or eating before bed   Will add back protonix  40 mg daily  Continue pepcid   Update if no improvement in next several weeks       Relevant Medications   pantoprazole  (PROTONIX ) 40 MG tablet     Other   Thoracic back pain   Bilateral/ muscular/spasm  No neuro symptoms  Reassuring  exam  Some chest wall tenderness on exam  Suspect due to severe cough and straining   Recommend heat as tolerated Gentle stretching  Massage if helpful   Working on cough control to see if this helps   Update if not starting to improve in a week or if worsening  Call back and Er precautions noted in detail today         Fatigue   Hope this will improve with iron  replacement        Dizziness   In hopes this will improve with iron  infusions / improvement in iron  def        Cough - Primary   Purulent sputum from trache-has steadily worsened  Reassuring exam but cough is severe  No wheezing or signs of reactive airways  Cxr : no infiltrate or acute changes   Recent worsening of acid reflux may add to this   Will continue pepcid  and add back  protonix  40 mg once daily in am  Avoid triggers   For purulent mucous- cover with doxycycline  for possible bacterial bronchitis  For cough- trial of promethazine  dm with caution of sedation   Update if not starting to improve in a week or if worsening   Call back and Er precautions noted in detail today         Relevant Orders   DG Chest 2 View (Completed)   Anemia   Hb in 7 range most recently  Has seen heme and started oral iron  / also venofer  infusions Expect improvement in energy level and dizziness as this improves Will continue to follow   May need to consider GI work up down the road once stabilized

## 2023-08-15 NOTE — Assessment & Plan Note (Signed)
 Purulent sputum from trache-has steadily worsened  Reassuring exam but cough is severe  No wheezing or signs of reactive airways  Cxr : no infiltrate or acute changes   Recent worsening of acid reflux may add to this   Will continue pepcid  and add back protonix  40 mg once daily in am  Avoid triggers   For purulent mucous- cover with doxycycline  for possible bacterial bronchitis  For cough- trial of promethazine  dm with caution of sedation   Update if not starting to improve in a week or if worsening   Call back and Er precautions noted in detail today

## 2023-08-15 NOTE — Patient Instructions (Addendum)
 Continue the generic pepcid    Add generic protonix  40 mg once daily in the am   Try to avoid foods/drinks that trigger acid reflux when you can   Continue to use warm compresses on your back for pain/spasm  Try to stretch   Chest xray today  We will make a plan when that reading comes back   Keep up a good fluid intake

## 2023-08-15 NOTE — Assessment & Plan Note (Signed)
 Hope this will improve with iron  replacement

## 2023-08-21 ENCOUNTER — Inpatient Hospital Stay

## 2023-08-21 VITALS — BP 139/76 | HR 74 | Temp 97.1°F | Resp 18

## 2023-08-21 DIAGNOSIS — E538 Deficiency of other specified B group vitamins: Secondary | ICD-10-CM | POA: Diagnosis not present

## 2023-08-21 DIAGNOSIS — D509 Iron deficiency anemia, unspecified: Secondary | ICD-10-CM | POA: Diagnosis not present

## 2023-08-21 DIAGNOSIS — D508 Other iron deficiency anemias: Secondary | ICD-10-CM

## 2023-08-21 DIAGNOSIS — Z79899 Other long term (current) drug therapy: Secondary | ICD-10-CM | POA: Diagnosis not present

## 2023-08-21 MED ORDER — SODIUM CHLORIDE 0.9 % IV SOLN
INTRAVENOUS | Status: DC
  Filled 2023-08-21: qty 250

## 2023-08-21 MED ORDER — SODIUM CHLORIDE 0.9% FLUSH
10.0000 mL | Freq: Once | INTRAVENOUS | Status: DC | PRN
Start: 2023-08-21 — End: 2023-08-21
  Filled 2023-08-21: qty 10

## 2023-08-21 MED ORDER — IRON SUCROSE 20 MG/ML IV SOLN
200.0000 mg | Freq: Once | INTRAVENOUS | Status: AC
  Administered 2023-08-21: 200 mg via INTRAVENOUS
  Filled 2023-08-21: qty 10

## 2023-08-21 MED ORDER — METHYLPREDNISOLONE SODIUM SUCC 125 MG IJ SOLR
40.0000 mg | INTRAMUSCULAR | Status: DC
  Administered 2023-08-21: 40 mg via INTRAVENOUS
  Filled 2023-08-21: qty 2

## 2023-08-21 MED ORDER — FAMOTIDINE IN NACL 20-0.9 MG/50ML-% IV SOLN
20.0000 mg | INTRAVENOUS | Status: DC
  Administered 2023-08-21: 20 mg via INTRAVENOUS
  Filled 2023-08-21: qty 50

## 2023-08-21 NOTE — Patient Instructions (Signed)

## 2023-08-28 ENCOUNTER — Inpatient Hospital Stay: Attending: Oncology

## 2023-08-28 VITALS — BP 147/67 | HR 78 | Temp 96.9°F | Resp 18

## 2023-08-28 DIAGNOSIS — D508 Other iron deficiency anemias: Secondary | ICD-10-CM

## 2023-08-28 DIAGNOSIS — D509 Iron deficiency anemia, unspecified: Secondary | ICD-10-CM | POA: Diagnosis not present

## 2023-08-28 MED ORDER — IRON SUCROSE 20 MG/ML IV SOLN
200.0000 mg | Freq: Once | INTRAVENOUS | Status: AC
  Administered 2023-08-28: 200 mg via INTRAVENOUS

## 2023-08-28 MED ORDER — METHYLPREDNISOLONE SODIUM SUCC 125 MG IJ SOLR
40.0000 mg | INTRAMUSCULAR | Status: DC
  Administered 2023-08-28: 40 mg via INTRAVENOUS

## 2023-08-28 MED ORDER — FAMOTIDINE IN NACL 20-0.9 MG/50ML-% IV SOLN
20.0000 mg | INTRAVENOUS | Status: DC
  Administered 2023-08-28: 20 mg via INTRAVENOUS

## 2023-09-02 ENCOUNTER — Other Ambulatory Visit: Payer: Self-pay | Admitting: Student

## 2023-09-03 ENCOUNTER — Other Ambulatory Visit: Payer: Self-pay | Admitting: Family Medicine

## 2023-09-03 NOTE — Telephone Encounter (Signed)
 Last filled on 08/15/23 #118 mL/ 0 refill, last OV was for a cough on 08/15/23

## 2023-09-03 NOTE — Telephone Encounter (Signed)
 How is her cough? (Before I refill this)

## 2023-09-04 ENCOUNTER — Inpatient Hospital Stay

## 2023-09-04 VITALS — BP 167/82 | HR 66 | Temp 98.2°F | Resp 18

## 2023-09-04 DIAGNOSIS — D508 Other iron deficiency anemias: Secondary | ICD-10-CM

## 2023-09-04 DIAGNOSIS — D509 Iron deficiency anemia, unspecified: Secondary | ICD-10-CM | POA: Diagnosis not present

## 2023-09-04 MED ORDER — METHYLPREDNISOLONE SODIUM SUCC 125 MG IJ SOLR
40.0000 mg | INTRAMUSCULAR | Status: DC
  Administered 2023-09-04: 40 mg via INTRAVENOUS
  Filled 2023-09-04: qty 2

## 2023-09-04 MED ORDER — FAMOTIDINE IN NACL 20-0.9 MG/50ML-% IV SOLN
20.0000 mg | INTRAVENOUS | Status: DC
  Administered 2023-09-04: 20 mg via INTRAVENOUS
  Filled 2023-09-04: qty 50

## 2023-09-04 MED ORDER — IRON SUCROSE 20 MG/ML IV SOLN
200.0000 mg | Freq: Once | INTRAVENOUS | Status: AC
  Administered 2023-09-04: 200 mg via INTRAVENOUS
  Filled 2023-09-04: qty 10

## 2023-09-04 NOTE — Telephone Encounter (Signed)
 Spoke with spouse he said pt still does have a cough but it is getting better. Pt can use a cough drop during the day and it helps but pt's cough is worse at night and will keep her up sometimes so spouse only give her the cough med at night to help with cough and that helps her sleep better.

## 2023-09-10 ENCOUNTER — Other Ambulatory Visit: Payer: Self-pay | Admitting: *Deleted

## 2023-09-10 ENCOUNTER — Ambulatory Visit: Payer: Self-pay

## 2023-09-10 MED ORDER — TIZANIDINE HCL 4 MG PO TABS: 4.0000 mg | ORAL_TABLET | Freq: Three times a day (TID) | ORAL | 0 refills | Status: DC | PRN

## 2023-09-10 NOTE — Telephone Encounter (Signed)
Pt's spouse notified of Dr. Marliss Coots comments

## 2023-09-10 NOTE — Telephone Encounter (Signed)
 Copied from CRM [id number]. Topic: Clinical - Red Word Triage >> Sep 10, 2023  8:36 AM Sharon [name] wrote: Red Word that prompted transfer to Nurse Triage: Worsening symptoms back spams. Previously seen getting worse.    Chief Complaint: Back pain, middle. No availability today. Appointment tomorrow. Asking for muscle relaxer or pain medicine. Symptoms: Pain Frequency: Sunday Pertinent Negatives: Patient denies  Disposition: [] ED /[] Urgent Care (no appt availability in office) / [] Appointment(In office/virtual)/ []  [facility]/ [] Home Care/ [] Refused Recommended Disposition /[] [facility]/ []  Follow-up with PCP Additional Notes: Please advise pt.   Reason for Disposition  [1] SEVERE back pain (e.g., excruciating, unable to do any normal activities) AND [2] not improved 2 hours after pain medicine  Answer Assessment - Initial Assessment Questions 1. ONSET: "When did the pain begin?"      Sunday 2. LOCATION: "Where does it hurt?" (upper, mid or lower back)     middle 3. SEVERITY: "How bad is the pain?"  (e.g., Scale 1-10; mild, moderate, or severe)   - MILD (1-3): Doesn't interfere with normal activities.    - MODERATE (4-7): Interferes with normal activities or awakens from sleep.    - SEVERE (8-10): Excruciating pain, unable to do any normal activities.      Severe 4. PATTERN: "Is the pain constant?" (e.g., yes, no; constant, intermittent)      constant 5. RADIATION: "Does the pain shoot into your legs or somewhere else?"     no 6. CAUSE:  "What do you think is causing the back pain?"      unsure 7. BACK OVERUSE:  "Any recent lifting of heavy objects, strenuous work or exercise?"     no 8. MEDICINES: "What have you taken so far for the pain?" (e.g., nothing, acetaminophen , NSAIDS)     OTC 9. NEUROLOGIC SYMPTOMS: "Do you have any weakness, numbness, or problems with bowel/bladder control?"     NO 10. OTHER SYMPTOMS: "Do you have any other symptoms?" (e.g.,  fever, abdomen pain, burning with urination, blood in urine)       No 11. PREGNANCY: "Is there any chance you are pregnant?" "When was your last menstrual period?"       no  Protocols used: Back Pain-A-AH

## 2023-09-10 NOTE — Telephone Encounter (Signed)
 Triage nurse asked these questions in prev note. Appt scheduled tomorrow

## 2023-09-10 NOTE — Telephone Encounter (Signed)
 Please get some more details Any cp or pain with breathing  Worse on one side? Urinary symptoms?  Fever or other?   In past had some thoracic back pain from coughing-is this the same or different?   Thanks

## 2023-09-10 NOTE — Addendum Note (Signed)
 Addended by: Deri Fleet A on: 09/10/2023 10:55 AM   Modules accepted: Orders

## 2023-09-10 NOTE — Addendum Note (Signed)
 Addended by: Queenie Brunet on: 09/10/2023 03:05 PM   Modules accepted: Orders

## 2023-09-10 NOTE — Telephone Encounter (Signed)
 I sent in some tizanadine/ muscle relaxer Use caution of sedation  We will see you as planned   Agree with ER/UC precautions

## 2023-09-11 ENCOUNTER — Ambulatory Visit: Payer: Self-pay | Admitting: Family Medicine

## 2023-09-11 ENCOUNTER — Inpatient Hospital Stay

## 2023-09-11 ENCOUNTER — Ambulatory Visit (INDEPENDENT_AMBULATORY_CARE_PROVIDER_SITE_OTHER)
Admission: RE | Admit: 2023-09-11 | Discharge: 2023-09-11 | Disposition: A | Discharge status: Home / Self Care | Source: Ambulatory Visit | Attending: Family Medicine | Admitting: Family Medicine

## 2023-09-11 ENCOUNTER — Encounter: Payer: Self-pay | Admitting: Family Medicine

## 2023-09-11 ENCOUNTER — Ambulatory Visit (INDEPENDENT_AMBULATORY_CARE_PROVIDER_SITE_OTHER): Admitting: Family Medicine

## 2023-09-11 VITALS — BP 154/79 | HR 67 | Temp 97.5°F

## 2023-09-11 VITALS — BP 150/90 | HR 94 | Temp 99.5°F | Ht 66.0 in | Wt 209.1 lb

## 2023-09-11 DIAGNOSIS — D508 Other iron deficiency anemias: Secondary | ICD-10-CM

## 2023-09-11 DIAGNOSIS — D509 Iron deficiency anemia, unspecified: Secondary | ICD-10-CM | POA: Diagnosis not present

## 2023-09-11 DIAGNOSIS — K449 Diaphragmatic hernia without obstruction or gangrene: Secondary | ICD-10-CM | POA: Diagnosis not present

## 2023-09-11 DIAGNOSIS — M4184 Other forms of scoliosis, thoracic region: Secondary | ICD-10-CM | POA: Diagnosis not present

## 2023-09-11 DIAGNOSIS — M546 Pain in thoracic spine: Secondary | ICD-10-CM | POA: Diagnosis not present

## 2023-09-11 DIAGNOSIS — M4856XA Collapsed vertebra, not elsewhere classified, lumbar region, initial encounter for fracture: Secondary | ICD-10-CM | POA: Diagnosis not present

## 2023-09-11 DIAGNOSIS — Z930 Tracheostomy status: Secondary | ICD-10-CM | POA: Diagnosis not present

## 2023-09-11 DIAGNOSIS — K219 Gastro-esophageal reflux disease without esophagitis: Secondary | ICD-10-CM

## 2023-09-11 DIAGNOSIS — R052 Subacute cough: Secondary | ICD-10-CM | POA: Diagnosis not present

## 2023-09-11 DIAGNOSIS — R058 Other specified cough: Secondary | ICD-10-CM | POA: Diagnosis not present

## 2023-09-11 DIAGNOSIS — M47814 Spondylosis without myelopathy or radiculopathy, thoracic region: Secondary | ICD-10-CM | POA: Diagnosis not present

## 2023-09-11 LAB — POC COVID19 BINAXNOW: SARS Coronavirus 2 Ag: NEGATIVE

## 2023-09-11 MED ORDER — FAMOTIDINE IN NACL 20-0.9 MG/50ML-% IV SOLN
20.0000 mg | INTRAVENOUS | Status: DC
  Administered 2023-09-11: 20 mg via INTRAVENOUS
  Filled 2023-09-11: qty 50

## 2023-09-11 MED ORDER — SODIUM CHLORIDE 0.9 % IV SOLN
INTRAVENOUS | Status: DC | PRN
  Filled 2023-09-11: qty 250

## 2023-09-11 MED ORDER — IRON SUCROSE 20 MG/ML IV SOLN
200.0000 mg | Freq: Once | INTRAVENOUS | Status: AC
  Administered 2023-09-11: 200 mg via INTRAVENOUS
  Filled 2023-09-11: qty 10

## 2023-09-11 MED ORDER — IPRATROPIUM-ALBUTEROL 0.5-2.5 (3) MG/3ML IN SOLN: 3.0000 mL | RESPIRATORY_TRACT | 1 refills | Status: AC | PRN

## 2023-09-11 MED ORDER — METHYLPREDNISOLONE SODIUM SUCC 125 MG IJ SOLR
40.0000 mg | INTRAMUSCULAR | Status: DC
  Administered 2023-09-11: 40 mg via INTRAVENOUS
  Filled 2023-09-11: qty 2

## 2023-09-11 MED ORDER — PANTOPRAZOLE SODIUM 40 MG PO TBEC: 40.0000 mg | DELAYED_RELEASE_TABLET | Freq: Every day | ORAL | 3 refills | Status: AC

## 2023-09-11 NOTE — Patient Instructions (Addendum)
 Chest xray today  Thoracic spine xray today  We will reach out with results    We may consider PT for the back  Also pulmonary for the cough   In meantime if symptoms worsen let us  know   Continue flonase   You can use nasal saline also if needed for congestion

## 2023-09-11 NOTE — Progress Notes (Signed)
 Subjective:    Patient ID: Sharon [name], female    DOB: January 01, 1951, 73 y.o.   MRN: [id number]  HPI  Wt Readings from Last 3 Encounters:  09/11/23 209 lb 2 oz (94.9 kg)  08/15/23 206 lb (93.4 kg)  08/07/23 207 lb (93.9 kg)   33.75 kg/m  Vitals:   09/11/23 1446  BP: (!) 150/90  Pulse: 94  Temp: 99.5 F (37.5 C)  SpO2: 97%    Pt presents with c/o  Back pain  Cough  Congestion   Last visit pt had thoracic back pain worsened by cough  Had some chest tenderness on exam  We sent in muscle relaxer this week   Per pt had this back pain before 20 years ago  Back spasms  Always muscular  Mid back  Both sides but worse on the left   Worsened by : twisting/turning  Changing position - lying to standing   Improved by massage , heat helps just a little  Tizandidine helped  Makes her woozy  Sometimes better flat on back or left side   Deep breath has no effect  Cough no effect      For cough at last visit we added back protonix  40 mg daily to control GERD Also taking pepcid   Her cxr at that time was normal   We covered the purulent mucous with doxycycline  -  Prometh  dm for cough   She did improve on the doxycycline   A week later started to worsen again  A lot of nasal congestion / nose and sinuses  Cannot breathe well  Lot of mucous from trache-primarily yellow A little blood   GERD - improved with protonix  /but not the cough   Xrays today  DG Thoracic Spine 2 View Result Date: 09/11/2023 CLINICAL DATA:  Mid back pain between shoulder blades EXAM: THORACIC SPINE 2 VIEWS COMPARISON:  08/15/2023 chest x-ray, chest x-ray  06/23/2018 FINDINGS: Tracheostomy tube is noted. Minimal thoracic scoliosis. Vertebral body heights are grossly maintained minimal chronic wedging of mid to lower thoracic vertebra. Mild to moderate mid to lower degenerative osteophytes. Hiatal hernia IMPRESSION: Mild degenerative changes. Mild chronic wedging deformity of mid to lower  thoracic vertebra. No acute osseous abnormality Electronically Signed   By: Esmeralda Hedge M.D.   On: 09/11/2023 16:38   DG Chest 2 View Result Date: 09/11/2023 CLINICAL DATA:  Productive cough EXAM: CHEST - 2 VIEW COMPARISON:  08/15/2023 FINDINGS: Tracheostomy tube tip is about 5.4 cm superior to carina. No acute airspace disease, pleural effusion or pneumothorax. Stable cardiomediastinal silhouette with aortic atherosclerosis. No pneumothorax IMPRESSION: No active cardiopulmonary disease. Electronically Signed   By: Esmeralda Hedge M.D.   On: 09/11/2023 16:36   DG Chest 2 View Result Date: 08/15/2023 CLINICAL DATA:  increasing cough /prod of green mucous in pt with a trache EXAM: CHEST - 2 VIEW COMPARISON:  06/26/2023 FINDINGS: Lungs are clear.  Stable tracheostomy device. Heart size and mediastinal contours are within normal limits. Aortic Atherosclerosis (ICD10-170.0). No effusion. Visualized bones unremarkable. IMPRESSION: No acute cardiopulmonary disease. Electronically Signed   By: Nicoletta Barrier M.D.   On: 08/15/2023 15:59    Covid test negative Results for orders placed or performed in visit on 09/11/23  POC COVID-19 BinaxNow   Collection Time: 09/11/23  3:29 PM  Result Value Ref Range   SARS Coronavirus 2 Ag Negative Negative      Patient Active Problem List   Diagnosis Date Noted   Thoracic back pain  08/15/2023   Iron  deficiency anemia 08/07/2023   Airway obstruction 06/26/2023   Tracheostomy tube present (HCC) 06/26/2023   Brain fog 05/15/2023   S/P percutaneous endoscopic gastrostomy (PEG) tube placement (HCC) 05/15/2023   Tracheostomy status (HCC) 05/15/2023   Allergic reaction to alpha-gal 04/04/2023   Chronic diastolic heart failure (HCC) 01/09/2023   Snoring 01/09/2023   Vitamin B12 deficiency 01/09/2023   Current use of proton pump inhibitor 01/01/2023   Angioedema due to angiotensin converting enzyme inhibitor (ACE-I) 02/27/2022   SVT (supraventricular tachycardia) (HCC)  02/22/2022   Urinary incontinence 12/09/2021   Pedal edema 07/26/2021   Angioedema 04/17/2021   Dizziness 08/23/2020   Fatigue 08/23/2020   Prediabetes 12/17/2019   Medicare annual wellness visit, subsequent 08/28/2018   Cough 01/28/2018   Welcome to Medicare preventive visit 08/22/2016   Obesity 10/05/2014   Anemia 09/24/2013   Palpitations 03/03/2013   Colon cancer screening 03/03/2013   Hypokalemia 10/09/2011   Routine general medical examination at a health care facility 10/02/2011   Vitamin D  deficiency disease 12/28/2010   ARTHRALGIA 08/19/2007   Hypothyroidism 01/31/2007   Hyperlipidemia 01/31/2007   GAD (generalized anxiety disorder) 01/31/2007   Essential hypertension 01/31/2007   GERD 01/31/2007   Past Medical History:  Diagnosis Date   Angio-edema    Anxiety    Cataract    CHF (congestive heart failure) (HCC)    Chronic heart failure with preserved ejection fraction (HCC)    Essential hypertension    GERD (gastroesophageal reflux disease)    Hyperlipidemia    Hypothyroidism    SVT (supraventricular tachycardia) (HCC)    Tracheostomy in place (HCC) 01/2023   Urinary incontinence    Vitamin B 12 deficiency    Vitamin D  deficiency    Past Surgical History:  Procedure Laterality Date   CATARACT EXTRACTION W/ INTRAOCULAR LENS IMPLANT Right 12/29/2016   Dr. Ronni Colace   CATARACT EXTRACTION W/ INTRAOCULAR LENS IMPLANT Left 01/15/2017   Dr. Emeterio Hansen Bevis   COLONOSCOPY     INTUBATION-ENDOTRACHEAL WITH TRACHEOSTOMY STANDBY N/A 02/01/2023   Procedure: INTUBATION-ENDOTRACHEAL WITH TRACHEOSTOMY STANDBY;  Surgeon: Lesly Raspberry, MD;  Location: [facility];  Service: ENT;  Laterality: N/A;   LEEP     PEG TUBE PLACEMENT  01/2023   PEG TUBE REMOVAL  04/2023   TRACHEOSTOMY TUBE PLACEMENT N/A 02/06/2023   Procedure: TRACHEOSTOMY;  Surgeon: Rogers Clayman, MD;  Location: [facility];  Service: ENT;  Laterality: N/A;   TRACHEOSTOMY TUBE PLACEMENT N/A 06/26/2023    Procedure: TRACHEOSTOMY REVISON COMPLEX;  Surgeon: Lesly Raspberry, MD;  Location: [facility];  Service: ENT;  Laterality: N/A;   TUBAL LIGATION     VEIN LIGATION AND STRIPPING     x2   Social History   Tobacco Use   Smoking status: Former    Current packs/day: 0.00    Types: Cigarettes    Quit date: 04/24/1996    Years since quitting: 27.4   Smokeless tobacco: Never  Vaping Use   Vaping status: Never Used  Substance Use Topics   Alcohol use: Yes    Alcohol/week: 4.0 standard drinks of alcohol    Types: 4 Shots of liquor per week    Comment: occasional   Drug use: No   Family History  Problem Relation Age of Onset   Transient ischemic attack Mother    Hypertension Mother    Hypertension Father    Colon cancer Neg Hx    Esophageal cancer Neg Hx    Rectal cancer Neg  Hx    Stomach cancer Neg Hx    Allergies  Allergen Reactions   Cozaar  [Losartan ] Swelling   Eliquis  [Apixaban ] Swelling   Gelatin Anaphylaxis   Septra  [Sulfamethoxazole -Trimethoprim ] Anaphylaxis   Zinc Gelatin [Zinc] Anaphylaxis   Alpha-Gal    Amlodipine Besylate     REACTION: edema   Atorvastatin     REACTION: muscle pain   Beef Allergy    Cat Dander    Dust Mite Extract    Neomycin-Bacitracin Zn-Polymyx     REACTION: rash Per pt not sure if from meds or not   Latex    Current Outpatient Medications on File Prior to Visit  Medication Sig Dispense Refill   EPINEPHrine  0.3 mg/0.3 mL IJ SOAJ injection Inject 0.3 mg into the muscle as needed.     famotidine  (PEPCID ) 20 MG tablet Take 1 tablet (20 mg total) by mouth 2 (two) times daily. 180 tablet 2   fexofenadine (ALLEGRA) 180 MG tablet Take 360 mg by mouth 2 (two) times daily.     fluticasone  (FLONASE ) 50 MCG/ACT nasal spray Place 2 sprays into both nostrils daily as needed for rhinitis.     hydrALAZINE  (APRESOLINE ) 25 MG tablet Take 1 tablet (25 mg total) by mouth every 8 (eight) hours. (Patient taking differently: Take 25 mg by mouth 3 (three) times  daily.) 270 tablet 1   iron  polysaccharides (NIFEREX) 150 MG capsule Take 1 capsule (150 mg total) by mouth 2 (two) times daily. 60 capsule 1   metoprolol  tartrate (LOPRESSOR ) 25 MG tablet TAKE 1 TABLET BY MOUTH TWICE A DAY 180 tablet 3   promethazine -dextromethorphan (PROMETHAZINE -DM) 6.25-15 MG/5ML syrup Take 5 mLs by mouth at bedtime as needed for cough. Caution of sedation 118 mL 0   rosuvastatin  (CRESTOR ) 20 MG tablet TAKE 1 TABLET BY MOUTH DAILY 90 tablet 1   tiZANidine  (ZANAFLEX ) 4 MG tablet Take 1 tablet (4 mg total) by mouth every 8 (eight) hours as needed for muscle spasms (back pain/ spasm). Caution of sedation 20 tablet 0   vitamin B-12 (CYANOCOBALAMIN ) 500 MCG tablet Take 500 mcg by mouth daily.     No current facility-administered medications on file prior to visit.    Review of Systems  Constitutional:  Positive for fatigue. Negative for activity change, appetite change, fever and unexpected weight change.  HENT:  Positive for congestion and postnasal drip. Negative for ear pain, rhinorrhea, sinus pressure, sinus pain and sore throat.   Eyes:  Negative for pain, redness and visual disturbance.  Respiratory:  Positive for cough. Negative for choking, chest tightness, shortness of breath, wheezing and stridor.   Cardiovascular:  Negative for chest pain and palpitations.  Gastrointestinal:  Negative for abdominal pain, blood in stool, constipation and diarrhea.  Endocrine: Negative for polydipsia and polyuria.  Genitourinary:  Negative for dysuria, frequency and urgency.  Musculoskeletal:  Positive for back pain. Negative for arthralgias and myalgias.  Skin:  Negative for pallor and rash.  Allergic/Immunologic: Negative for environmental allergies.  Neurological:  Negative for dizziness, syncope and headaches.  Hematological:  Negative for adenopathy. Does not bruise/bleed easily.  Psychiatric/Behavioral:  Positive for behavioral problems. Negative for decreased concentration and  dysphoric mood. The patient is not nervous/anxious.        Gets frustrated over medical problems        Objective:   Physical Exam Constitutional:      General: She is not in acute distress.    Appearance: Normal appearance. She is well-developed. She is obese.  She is not ill-appearing or diaphoretic.  HENT:     Head: Normocephalic and atraumatic.     Nose: Congestion and rhinorrhea present.     Mouth/Throat:     Mouth: Mucous membranes are moist.     Comments: Trache tube is mildly congested  Eyes:     Conjunctiva/sclera: Conjunctivae normal.     Pupils: Pupils are equal, round, and reactive to light.  Neck:     Thyroid : No thyromegaly.     Vascular: No carotid bruit or JVD.  Cardiovascular:     Rate and Rhythm: Normal rate and regular rhythm.     Heart sounds: Normal heart sounds.     No gallop.  Pulmonary:     Effort: Pulmonary effort is normal. No respiratory distress.     Breath sounds: Normal breath sounds. No stridor. No wheezing, rhonchi or rales.     Comments: Few upper airway sounds Otherwise clear  Chest:     Chest wall: No tenderness.  Abdominal:     General: There is no distension or abdominal bruit.     Palpations: Abdomen is soft.     Tenderness: There is no abdominal tenderness.  Musculoskeletal:     Cervical back: Normal range of motion and neck supple.     Right lower leg: No edema.     Left lower leg: No edema.  Lymphadenopathy:     Cervical: No cervical adenopathy.  Skin:    General: Skin is warm and dry.     Coloration: Skin is not pale.     Findings: No rash.  Neurological:     Mental Status: She is alert.     Coordination: Coordination normal.     Deep Tendon Reflexes: Reflexes are normal and symmetric. Reflexes normal.  Psychiatric:        Mood and Affect: Mood normal.           Assessment & Plan:   Problem List Items Addressed This Visit       Digestive   GERD   Acid reflux symptoms (heartburn) much improved with re  introduction of protonix  40 mg Will continue this  Encouraged to avoid triggers Continue pepcid    Unfortunately cough is still present       Relevant Medications   pantoprazole  (PROTONIX ) 40 MG tablet     Other   Tracheostomy tube present (HCC)   ? Adding to cough  In place for recurrent anaphylaxis - no recent flares  Hopes to get this out in the near future  Current allergist has pt on higher dose of allegra 360 mg bid       Relevant Orders   DG Chest 2 View (Completed)   Ambulatory referral to Pulmonology   Thoracic back pain   Ongoing  On and off in the past  On xray some chronic minimal wedging of mid to lower TS  This may add to problem No radicular symptoms  Encouraged to try tizanadine at smaller dose due to sedation   Will refer to PT   Call back and Er precautions noted in detail today        Relevant Orders   DG Thoracic Spine 2 View (Completed)   Ambulatory referral to Physical Therapy   Cough - Primary   Ongoing With purulent sputum from trache-briefly improved and now worse again  Cxr clear Lung exam reassuring  On max treatment for GERD (that is improved but not cough) Will refer to pulmonary for further eval  Relevant Orders   DG Chest 2 View (Completed)   POC COVID-19 BinaxNow (Completed)   Ambulatory referral to Pulmonology

## 2023-09-11 NOTE — Assessment & Plan Note (Signed)
?   Adding to cough  In place for recurrent anaphylaxis - no recent flares  Hopes to get this out in the near future  Current allergist has pt on higher dose of allegra 360 mg bid

## 2023-09-11 NOTE — Assessment & Plan Note (Signed)
 Acid reflux symptoms (heartburn) much improved with re introduction of protonix  40 mg Will continue this  Encouraged to avoid triggers Continue pepcid    Unfortunately cough is still present

## 2023-09-11 NOTE — Assessment & Plan Note (Signed)
 Ongoing With purulent sputum from trache-briefly improved and now worse again  Cxr clear Lung exam reassuring  On max treatment for GERD (that is improved but not cough) Will refer to pulmonary for further eval

## 2023-09-11 NOTE — Assessment & Plan Note (Signed)
 Ongoing  On and off in the past  On xray some chronic minimal wedging of mid to lower TS  This may add to problem No radicular symptoms  Encouraged to try tizanadine at smaller dose due to sedation   Will refer to PT   Call back and Er precautions noted in detail today

## 2023-09-13 NOTE — Telephone Encounter (Signed)
 Copied from CRM [id number]. Topic: Clinical - Lab/Test Results >> Sep 12, 2023  4:31 PM Chuck Crater wrote: Reason for CRM: Mr Merlino is returning a call from [place] regarding imaging results.

## 2023-09-20 ENCOUNTER — Other Ambulatory Visit: Payer: Self-pay | Admitting: Family Medicine

## 2023-10-08 ENCOUNTER — Other Ambulatory Visit

## 2023-10-08 ENCOUNTER — Ambulatory Visit: Admitting: Oncology

## 2023-10-09 ENCOUNTER — Encounter: Payer: Self-pay | Admitting: Oncology

## 2023-10-09 ENCOUNTER — Inpatient Hospital Stay: Attending: Oncology

## 2023-10-09 ENCOUNTER — Inpatient Hospital Stay (HOSPITAL_BASED_OUTPATIENT_CLINIC_OR_DEPARTMENT_OTHER): Admitting: Oncology

## 2023-10-09 ENCOUNTER — Ambulatory Visit: Payer: Self-pay | Admitting: Oncology

## 2023-10-09 ENCOUNTER — Other Ambulatory Visit: Payer: Self-pay

## 2023-10-09 DIAGNOSIS — I10 Essential (primary) hypertension: Secondary | ICD-10-CM

## 2023-10-09 DIAGNOSIS — D508 Other iron deficiency anemias: Secondary | ICD-10-CM

## 2023-10-09 DIAGNOSIS — E538 Deficiency of other specified B group vitamins: Secondary | ICD-10-CM

## 2023-10-09 DIAGNOSIS — Z79899 Other long term (current) drug therapy: Secondary | ICD-10-CM | POA: Insufficient documentation

## 2023-10-09 DIAGNOSIS — D649 Anemia, unspecified: Secondary | ICD-10-CM

## 2023-10-09 DIAGNOSIS — D509 Iron deficiency anemia, unspecified: Secondary | ICD-10-CM | POA: Diagnosis not present

## 2023-10-09 LAB — CBC WITH DIFFERENTIAL/PLATELET
Abs Immature Granulocytes: 0.05 10*3/uL (ref 0.00–0.07)
Basophils Absolute: 0 10*3/uL (ref 0.0–0.1)
Basophils Relative: 0 %
Eosinophils Absolute: 0.2 10*3/uL (ref 0.0–0.5)
Eosinophils Relative: 3 %
HCT: 37.9 % (ref 36.0–46.0)
Hemoglobin: 11.8 g/dL — ABNORMAL LOW (ref 12.0–15.0)
Immature Granulocytes: 1 %
Lymphocytes Relative: 28 %
Lymphs Abs: 2.6 10*3/uL (ref 0.7–4.0)
MCH: 26.7 pg (ref 26.0–34.0)
MCHC: 31.1 g/dL (ref 30.0–36.0)
MCV: 85.7 fL (ref 80.0–100.0)
Monocytes Absolute: 0.8 10*3/uL (ref 0.1–1.0)
Monocytes Relative: 8 %
Neutro Abs: 5.5 10*3/uL (ref 1.7–7.7)
Neutrophils Relative %: 60 %
Platelets: 278 10*3/uL (ref 150–400)
RBC: 4.42 MIL/uL (ref 3.87–5.11)
RDW: 24.4 % — ABNORMAL HIGH (ref 11.5–15.5)
WBC: 9.1 10*3/uL (ref 4.0–10.5)
nRBC: 0 % (ref 0.0–0.2)

## 2023-10-09 LAB — IRON AND TIBC
Iron: 96 ug/dL (ref 28–170)
Saturation Ratios: 24 % (ref 10.4–31.8)
TIBC: 406 ug/dL (ref 250–450)
UIBC: 310 ug/dL

## 2023-10-09 LAB — FERRITIN: Ferritin: 41 ng/mL (ref 11–307)

## 2023-10-09 LAB — VITAMIN B12: Vitamin B-12: 359 pg/mL (ref 180–914)

## 2023-10-09 NOTE — Progress Notes (Signed)
 Hematology/Oncology Consult note [facility]  [phone] [phone]  Patient Care Team: Tower, Manley Seeds, MD as PCP - General (Family Medicine) Devorah Fonder, MD as PCP - Cardiology (Cardiology) Avonne Boettcher, MD as Consulting Physician (Hematology and Oncology)   Name of the patient: Sharon [name]  [id number]  Oct 05, 1950   Date of visit: 10/09/23  Diagnosis-iron  deficiency anemia  Chief complaint/ Reason for visit-routine follow up iron  deficiency anemia  Heme/Onc history: patient is a 73 year old female with a past medical history significant for hypertension hyperlipidemia hypothyroidism, history of CHF Who has been referred for iron  deficiency anemia.  Labs from 07/26/2023 showed white count of 7.4, H&H of 7.7/25 with an MCV of 72.8 and a platelet count of 153.  Ferritin levels were low at 5.1.  Last colonoscopy was 10 years ago which shows diverticulosis in the sigmoid colon.  B12 levels from January 2025 normal at 434.  She denies any blood loss in her stool or urine.  Denies any dark melanotic stools.  Denies any vaginal bleeding.  She has been taking oral iron  without any side effects.   Patient has had history of anaphylaxis and throat swelling to multiple medications and she is still getting workup for the same.  Because of multiple hospitalizations she has required a tracheostomy to protect her airway.   She tolerated Venofer  without any significant side effects  Interval history-she reports improvement in her energy levels after receiving IV iron .  Her tracheostomy is going to be taken out soon.  ECOG PS- 1 Pain scale- 0   Review of systems- Review of Systems  Constitutional:  Negative for chills, fever, malaise/fatigue and weight loss.  HENT:  Negative for congestion, ear discharge and nosebleeds.   Eyes:  Negative for blurred vision.  Respiratory:  Negative for cough, hemoptysis, sputum production, shortness of breath  and wheezing.   Cardiovascular:  Negative for chest pain, palpitations, orthopnea and claudication.  Gastrointestinal:  Negative for abdominal pain, blood in stool, constipation, diarrhea, heartburn, melena, nausea and vomiting.  Genitourinary:  Negative for dysuria, flank pain, frequency, hematuria and urgency.  Musculoskeletal:  Negative for back pain, joint pain and myalgias.  Skin:  Negative for rash.  Neurological:  Negative for dizziness, tingling, focal weakness, seizures, weakness and headaches.  Endo/Heme/Allergies:  Does not bruise/bleed easily.  Psychiatric/Behavioral:  Negative for depression and suicidal ideas. The patient does not have insomnia.       Allergies  Allergen Reactions   Cozaar  [Losartan ] Swelling   Eliquis  [Apixaban ] Swelling   Gelatin Anaphylaxis   Septra  [Sulfamethoxazole -Trimethoprim ] Anaphylaxis   Zinc Gelatin [Zinc] Anaphylaxis   Alpha-Gal    Amlodipine Besylate     REACTION: edema   Atorvastatin     REACTION: muscle pain   Beef Allergy    Cat Dander    Dust Mite Extract    Neomycin-Bacitracin Zn-Polymyx     REACTION: rash Per pt not sure if from meds or not   Latex      Past Medical History:  Diagnosis Date   Angio-edema    Anxiety    Cataract    CHF (congestive heart failure) (HCC)    Chronic heart failure with preserved ejection fraction (HCC)    Essential hypertension    GERD (gastroesophageal reflux disease)    Hyperlipidemia    Hypothyroidism    SVT (supraventricular tachycardia) (HCC)    Tracheostomy in place [facility]) 01/2023   Urinary incontinence    Vitamin B  12 deficiency    Vitamin D  deficiency      Past Surgical History:  Procedure Laterality Date   CATARACT EXTRACTION W/ INTRAOCULAR LENS IMPLANT Right 12/29/2016   Dr. Ronni Colace   CATARACT EXTRACTION W/ INTRAOCULAR LENS IMPLANT Left 01/15/2017   Dr. Emeterio Hansen Bevis   COLONOSCOPY     INTUBATION-ENDOTRACHEAL WITH TRACHEOSTOMY STANDBY N/A 02/01/2023   Procedure:  INTUBATION-ENDOTRACHEAL WITH TRACHEOSTOMY STANDBY;  Surgeon: Lesly Raspberry, MD;  Location: [facility] ORS;  Service: ENT;  Laterality: N/A;   LEEP     PEG TUBE PLACEMENT  01/2023   PEG TUBE REMOVAL  04/2023   TRACHEOSTOMY TUBE PLACEMENT N/A 02/06/2023   Procedure: TRACHEOSTOMY;  Surgeon: Rogers Clayman, MD;  Location: [facility] ORS;  Service: ENT;  Laterality: N/A;   TRACHEOSTOMY TUBE PLACEMENT N/A 06/26/2023   Procedure: TRACHEOSTOMY REVISON COMPLEX;  Surgeon: Lesly Raspberry, MD;  Location: [facility] ORS;  Service: ENT;  Laterality: N/A;   TUBAL LIGATION     VEIN LIGATION AND STRIPPING     x2    Social History   Socioeconomic History   Marital status: Married    Spouse name: Royston Cornea   Number of children: 2   Years of education: Not on file   Highest education level: Not on file  Occupational History   Not on file  Tobacco Use   Smoking status: Former    Current packs/day: 0.00    Types: Cigarettes    Quit date: 04/24/1996    Years since quitting: 27.4   Smokeless tobacco: Never  Vaping Use   Vaping status: Never Used  Substance and Sexual Activity   Alcohol use: Yes    Alcohol/week: 4.0 standard drinks of alcohol    Types: 4 Shots of liquor per week    Comment: occasional   Drug use: No   Sexual activity: Not Currently  Other Topics Concern   Not on file  Social History Narrative   Not on file   Social Drivers of Health   Financial Resource Strain: Low Risk  (02/21/2022)   Overall Financial Resource Strain (CARDIA)    Difficulty of Paying Living Expenses: Not hard at all  Food Insecurity: No Food Insecurity (08/07/2023)   Hunger Vital Sign    Worried About Running Out of Food in the Last Year: Never true    Ran Out of Food in the Last Year: Never true  Transportation Needs: No Transportation Needs (08/07/2023)   PRAPARE - Administrator, Civil Service (Medical): No    Lack of Transportation (Non-Medical): No  Physical Activity: Insufficiently Active (02/21/2022)    Exercise Vital Sign    Days of Exercise per Week: 5 days    Minutes of Exercise per Session: 10 min  Stress: No Stress Concern Present (02/21/2022)   [employer] of Occupational Health - Occupational Stress Questionnaire    Feeling of Stress : Not at all  Social Connections: Patient Unable To Answer (06/26/2023)   Social Connection and Isolation Panel    Frequency of Communication with Friends and Family: Patient unable to answer    Frequency of Social Gatherings with Friends and Family: Patient unable to answer    Attends Religious Services: Patient unable to answer    Active Member of Clubs or Organizations: Patient unable to answer    Attends Banker Meetings: Patient unable to answer    Marital Status: Patient unable to answer  Intimate Partner Violence: Not At Risk (08/07/2023)   Humiliation, Afraid, Rape, and Kick  questionnaire    Fear of Current or Ex-Partner: No    Emotionally Abused: No    Physically Abused: No    Sexually Abused: No    Family History  Problem Relation Age of Onset   Transient ischemic attack Mother    Hypertension Mother    Hypertension Father    Colon cancer Neg Hx    Esophageal cancer Neg Hx    Rectal cancer Neg Hx    Stomach cancer Neg Hx      Current Outpatient Medications:    EPINEPHrine  0.3 mg/0.3 mL IJ SOAJ injection, Inject 0.3 mg into the muscle as needed., Disp: , Rfl:    famotidine  (PEPCID ) 20 MG tablet, Take 1 tablet (20 mg total) by mouth 2 (two) times daily., Disp: 180 tablet, Rfl: 2   fexofenadine (ALLEGRA) 180 MG tablet, Take 360 mg by mouth 2 (two) times daily., Disp: , Rfl:    fluticasone  (FLONASE ) 50 MCG/ACT nasal spray, Place 2 sprays into both nostrils daily as needed for rhinitis., Disp: , Rfl:    hydrALAZINE  (APRESOLINE ) 25 MG tablet, Take 1 tablet (25 mg total) by mouth every 8 (eight) hours. (Patient taking differently: Take 25 mg by mouth 3 (three) times daily.), Disp: 270 tablet, Rfl: 1    ipratropium-albuterol  (DUONEB) 0.5-2.5 (3) MG/3ML SOLN, Take 3 mLs by nebulization every 4 (four) hours as needed., Disp: 120 mL, Rfl: 1   iron  polysaccharides (NIFEREX) 150 MG capsule, Take 1 capsule (150 mg total) by mouth 2 (two) times daily., Disp: 60 capsule, Rfl: 1   metoprolol  tartrate (LOPRESSOR ) 25 MG tablet, TAKE 1 TABLET BY MOUTH TWICE A DAY, Disp: 180 tablet, Rfl: 3   pantoprazole  (PROTONIX ) 40 MG tablet, Take 1 tablet (40 mg total) by mouth daily. In am at least 30 minutes before food/drink or medicines, Disp: 90 tablet, Rfl: 3   promethazine -dextromethorphan (PROMETHAZINE -DM) 6.25-15 MG/5ML syrup, Take 5 mLs by mouth at bedtime as needed for cough. Caution of sedation, Disp: 118 mL, Rfl: 0   rosuvastatin  (CRESTOR ) 20 MG tablet, TAKE 1 TABLET BY MOUTH DAILY, Disp: 90 tablet, Rfl: 0   tiZANidine  (ZANAFLEX ) 4 MG tablet, Take 1 tablet (4 mg total) by mouth every 8 (eight) hours as needed for muscle spasms (back pain/ spasm). Caution of sedation, Disp: 20 tablet, Rfl: 0   vitamin B-12 (CYANOCOBALAMIN ) 500 MCG tablet, Take 500 mcg by mouth daily., Disp: , Rfl:   Physical exam:  Vitals:   10/09/23 1117  BP: (!) 169/103  Pulse: 65  Resp: 18  Temp: 97.9 F (36.6 C)  TempSrc: Tympanic  SpO2: 98%  Weight: 217 lb 4.8 oz (98.6 kg)  Height: 5' 6 (1.676 m)   Physical Exam HENT:     Mouth/Throat:     Comments: Tracheostomy in place  Cardiovascular:     Rate and Rhythm: Normal rate and regular rhythm.     Heart sounds: Normal heart sounds.  Pulmonary:     Effort: Pulmonary effort is normal.     Breath sounds: Normal breath sounds.  Abdominal:     General: Bowel sounds are normal.     Palpations: Abdomen is soft.   Skin:    General: Skin is warm and dry.   Neurological:     Mental Status: She is alert and oriented to person, place, and time.      I have personally reviewed labs listed below:    Latest Ref Rng & Units 07/26/2023    9:53 AM  CMP  Glucose 70 - 99 mg/dL 99    BUN 6 - 23 mg/dL 9   Creatinine 2.95 - 6.21 mg/dL 3.08   Sodium 657 - 846 mEq/L 137   Potassium 3.5 - 5.1 mEq/L 4.0   Chloride 96 - 112 mEq/L 104   CO2 19 - 32 mEq/L 27   Calcium  8.4 - 10.5 mg/dL 9.3   Total Protein 6.0 - 8.3 g/dL 6.7   Total Bilirubin 0.2 - 1.2 mg/dL 0.4   Alkaline Phos 39 - 117 U/L 62   AST 0 - 37 U/L 13   ALT 0 - 35 U/L 5       Latest Ref Rng & Units 10/09/2023   10:45 AM  CBC  WBC 4.0 - 10.5 K/uL 9.1   Hemoglobin 12.0 - 15.0 g/dL 96.2   Hematocrit 95.2 - 46.0 % 37.9   Platelets 150 - 400 K/uL 278    I have personally reviewed Radiology images listed below: No images are attached to the encounter.  DG Thoracic Spine 2 View Result Date: 09/11/2023 CLINICAL DATA:  Mid back pain between shoulder blades EXAM: THORACIC SPINE 2 VIEWS COMPARISON:  08/15/2023 chest x-ray, chest x-ray  06/23/2018 FINDINGS: Tracheostomy tube is noted. Minimal thoracic scoliosis. Vertebral body heights are grossly maintained minimal chronic wedging of mid to lower thoracic vertebra. Mild to moderate mid to lower degenerative osteophytes. Hiatal hernia IMPRESSION: Mild degenerative changes. Mild chronic wedging deformity of mid to lower thoracic vertebra. No acute osseous abnormality Electronically Signed   By: Esmeralda Hedge M.D.   On: 09/11/2023 16:38   DG Chest 2 View Result Date: 09/11/2023 CLINICAL DATA:  Productive cough EXAM: CHEST - 2 VIEW COMPARISON:  08/15/2023 FINDINGS: Tracheostomy tube tip is about 5.4 cm superior to carina. No acute airspace disease, pleural effusion or pneumothorax. Stable cardiomediastinal silhouette with aortic atherosclerosis. No pneumothorax IMPRESSION: No active cardiopulmonary disease. Electronically Signed   By: Esmeralda Hedge M.D.   On: 09/11/2023 16:36     Assessment and plan- Patient is a 73 y.o. female here for a routine Follow-up of iron  deficiency anemia  After receiving IV iron  patient's hemoglobin has improved from 7.7 presently to 11.8.   Ferritin levels have improved from 5.1-41.  However given that they are less than 50 still we will plan to give her 3 more doses of Venofer .  She will be receiving Solu-Medrol  as premed as well.  She has tolerated Venofer  well recently.  CBC ferritin and iron  studies in 3 and 6 months and I will see her back in 6 months   Visit Diagnosis 1. Other iron  deficiency anemia      Dr. Seretha Dance, MD, MPH [facility] [phone] 10/09/2023 4:24 PM

## 2023-10-09 NOTE — Progress Notes (Signed)
 Patient reports she's been having dizziness and is actually having a spell this morning. This has been going on for quite sometime now and it comes and goes. She also states she has never been treated for this matter.  Patient also reports she's have some discomfort with urination.

## 2023-10-10 ENCOUNTER — Telehealth: Payer: Self-pay | Admitting: *Deleted

## 2023-10-10 NOTE — Telephone Encounter (Signed)
-----   Message from Avonne Boettcher sent at 10/09/2023  4:22 PM EDT ----- Ferritin is less than 50.  I would recommend Venofer  x 3 ----- Message ----- From: Interface, [facility] [place] Sent: 10/09/2023  11:18 AM EDT To: Avonne Boettcher, MD

## 2023-10-10 NOTE — Telephone Encounter (Signed)
 Per Dr. Randy Buttery Ferritin is less than 50. I would recommend Venofer  x 3.  Outbound call; voice message left. Will try again later.

## 2023-10-10 NOTE — Telephone Encounter (Signed)
 I called the home [phone] and I did not get an answer so I left a voicemail stating Ferritin is less than 50. I would recommend Venofer  x 3.  I also said in the message that when you get this information you can give us  a call if she wants to get Venofer  for 3 times.

## 2023-10-11 DIAGNOSIS — T783XXA Angioneurotic edema, initial encounter: Secondary | ICD-10-CM | POA: Diagnosis not present

## 2023-10-11 DIAGNOSIS — Z430 Encounter for attention to tracheostomy: Secondary | ICD-10-CM | POA: Diagnosis not present

## 2023-10-11 NOTE — Telephone Encounter (Signed)
 Per Valinda Gault 10/10/23 I called the home [phone] and I did not get an answer so I left a voicemail stating Ferritin is less than 50. I would recommend Venofer  x 3.  I also said in the message that when you get this information you can give us  a call if she wants to get Venofer  for 3 times.  Will try contacting again shortly.

## 2023-10-15 ENCOUNTER — Encounter: Payer: Self-pay | Admitting: Oncology

## 2023-10-15 ENCOUNTER — Inpatient Hospital Stay

## 2023-10-15 NOTE — Telephone Encounter (Signed)
 Venofer  infusions scheduled; no further follow up needed.

## 2023-10-16 DIAGNOSIS — Z430 Encounter for attention to tracheostomy: Secondary | ICD-10-CM | POA: Diagnosis not present

## 2023-10-16 DIAGNOSIS — T783XXD Angioneurotic edema, subsequent encounter: Secondary | ICD-10-CM | POA: Diagnosis not present

## 2023-10-22 ENCOUNTER — Inpatient Hospital Stay

## 2023-10-22 VITALS — BP 152/89 | HR 72 | Temp 98.8°F | Resp 18

## 2023-10-22 DIAGNOSIS — D508 Other iron deficiency anemias: Secondary | ICD-10-CM

## 2023-10-22 DIAGNOSIS — Z79899 Other long term (current) drug therapy: Secondary | ICD-10-CM | POA: Diagnosis not present

## 2023-10-22 DIAGNOSIS — D509 Iron deficiency anemia, unspecified: Secondary | ICD-10-CM | POA: Diagnosis not present

## 2023-10-22 MED ORDER — METHYLPREDNISOLONE SODIUM SUCC 40 MG IJ SOLR
40.0000 mg | INTRAMUSCULAR | Status: DC
  Administered 2023-10-22: 40 mg via INTRAVENOUS
  Filled 2023-10-22: qty 1

## 2023-10-22 MED ORDER — IRON SUCROSE 20 MG/ML IV SOLN
200.0000 mg | INTRAVENOUS | Status: DC
  Administered 2023-10-22: 200 mg via INTRAVENOUS
  Filled 2023-10-22: qty 10

## 2023-10-22 NOTE — Patient Instructions (Signed)

## 2023-10-24 ENCOUNTER — Encounter: Payer: Self-pay | Admitting: Student in an Organized Health Care Education/Training Program

## 2023-10-24 ENCOUNTER — Ambulatory Visit (INDEPENDENT_AMBULATORY_CARE_PROVIDER_SITE_OTHER): Admitting: Student in an Organized Health Care Education/Training Program

## 2023-10-24 VITALS — BP 148/92 | HR 65 | Temp 97.1°F | Ht 66.0 in | Wt 217.8 lb

## 2023-10-24 DIAGNOSIS — Z930 Tracheostomy status: Secondary | ICD-10-CM

## 2023-10-24 DIAGNOSIS — Z87891 Personal history of nicotine dependence: Secondary | ICD-10-CM

## 2023-10-24 DIAGNOSIS — R0602 Shortness of breath: Secondary | ICD-10-CM | POA: Diagnosis not present

## 2023-10-24 NOTE — Progress Notes (Signed)
 Assessment & Plan:   1. Shortness of breath 2. 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  She is presenting today for the evaluation of cough as well as some shortness of breath.  Patient is also inquiring about tracheostomy tube removal.  Discussed with the patient that most of her symptoms are secondary to the tracheostomy tube, though they appear to be manageable.  Explained the risk of having catastrophic respiratory failure and inability to secure an airway secondary to her recurrent angioedema.  While she has not had an episode in 3 months, it appears that she is in discussion with her allergist to decrease the dose of her antihistamines. I would recommend maintaining the tracheostomy tube until at least her anti-histamine dose is stable and she does not show any recurrence of her underlying condition.  I did explain to the patient that I would not be the one removing the tracheostomy tube as it was placed by Dr. Herminio and have recommended that she discuss this further with him.  As to workup for her cough, I will obtain a CT scan of the neck to assess for malpositioning of the tracheostomy tube as well as for any tracheal stenosis. I will also obtain a CT scan of the chest to rule out any bronchiectasis.  Finally, I will send in an order for nebulizer machine to help with albuterol  delivery.   - CT CHEST WO CONTRAST; Future - CT SOFT TISSUE NECK WO CONTRAST; Future - AMB REFERRAL FOR DME - Follow-up with ENT for trach management  Return in about 4 weeks (around 11/21/2023).  I spent 45 minutes caring for this patient today, including preparing to see the patient, obtaining a medical history , reviewing a separately obtained history, performing a medically appropriate examination and/or evaluation, counseling and  educating the patient/family/caregiver, ordering medications, tests, or procedures, documenting clinical information in the electronic health record, and independently interpreting results (not separately reported/billed) and communicating results to the patient/family/caregiver  Belva November, MD [facility]   End of visit medications:  No orders of the defined types were placed in this encounter.    Current Outpatient Medications:    EPINEPHrine  0.3 mg/0.3 mL IJ SOAJ injection, Inject 0.3 mg into the muscle as needed., Disp: , Rfl:    famotidine  (PEPCID ) 20 MG tablet, Take 1 tablet (20 mg total) by mouth 2 (two) times daily., Disp: 180 tablet, Rfl: 2   fexofenadine (ALLEGRA) 180 MG tablet, Take 360 mg by mouth 2 (two) times daily., Disp: , Rfl:    fluticasone  (FLONASE ) 50 MCG/ACT nasal spray, Place 2 sprays into both nostrils daily as needed for rhinitis., Disp: , Rfl:    hydrALAZINE  (APRESOLINE ) 25 MG tablet, Take 1 tablet (25 mg total) by mouth every 8 (eight) hours. (Patient taking differently: Take 25 mg by mouth 3 (three) times daily.), Disp: 270 tablet, Rfl: 1   ipratropium-albuterol  (DUONEB) 0.5-2.5 (3) MG/3ML SOLN, Take 3 mLs by nebulization every 4 (four) hours as needed., Disp: 120 mL, Rfl: 1   iron  polysaccharides (NIFEREX) 150 MG capsule, Take 1 capsule (150 mg total) by mouth 2 (two) times daily., Disp: 60 capsule, Rfl: 1   metoprolol  tartrate (LOPRESSOR ) 25 MG tablet, TAKE 1 TABLET BY MOUTH TWICE A DAY, Disp: 180 tablet, Rfl: 3   pantoprazole  (PROTONIX ) 40 MG tablet, Take 1 tablet (40 mg total)  by mouth daily. In am at least 30 minutes before food/drink or medicines, Disp: 90 tablet, Rfl: 3   promethazine -dextromethorphan (PROMETHAZINE -DM) 6.25-15 MG/5ML syrup, Take 5 mLs by mouth at bedtime as needed for cough. Caution of sedation, Disp: 118 mL, Rfl: 0   rosuvastatin  (CRESTOR ) 20 MG tablet, TAKE 1 TABLET BY MOUTH DAILY, Disp: 90 tablet, Rfl: 0   tiZANidine   (ZANAFLEX ) 4 MG tablet, Take 1 tablet (4 mg total) by mouth every 8 (eight) hours as needed for muscle spasms (back pain/ spasm). Caution of sedation, Disp: 20 tablet, Rfl: 0   vitamin B-12 (CYANOCOBALAMIN ) 500 MCG tablet, Take 500 mcg by mouth daily., Disp: , Rfl:    Subjective:   PATIENT ID: Sharon [name] DOB: [birth year], MRN: [id number]  Chief Complaint  Patient presents with   Consult    Cough since she got the trach in 02/06/2023. Wheezing. No SOB    HPI  Patient is a 73 year old [name] with a past medical history of angioedema with recurrent respiratory failure status post tracheostomy tube placement who presents to clinic to establish care.  Patient has had multiple presentations and hospitalizations secondary to recurrent angioedema.  She has had 24 presentations to the ED and hospital since 2023 for angioedema.  She has required multiple intubations to secure her airway and to manage her hypoxic respiratory failure.  She was admitted in October 2024 with similar presentation.  During this presentation, she was noted to have significant angioedema in her mouth and tongue and while not in respiratory distress, she was at high risk for respiratory failure and loss of airway.  ENT was consulted and she underwent tracheostomy tube placement on 02/06/2023.  She since had her tracheostomy tube exchanged and currently has a cuffless Shiley.  Presents today for the evaluation of the ostomy tube as well as a chief complaint of cough.  She reports that the cough has been present since her tracheostomy tube has been placed.  The cough is mostly dry but at times brings up some sputum.  She feels she is unable to sleep secondary to this. She is inquiring about the possibility of having the tracheostomy tube removed.  She has been followed closely by her ENT Dr. Herminio who has counseled her against decannulation.  Patient has established care with an allergist at [facility] where she was  started on high-dose antihistamines.  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  Allergist is considering decreasing the dose of her antihistamines.  They have recommended she discuss tracheostomy tube removal with ENT.  Ancillary information including prior medications, full medical/surgical/family/social histories, and PFTs (when available) are listed below and have been reviewed.   Review of Systems  Constitutional:  Negative for chills and fever.  Respiratory:  Positive for cough and shortness of breath. Negative for hemoptysis, sputum production and wheezing.   Cardiovascular:  Negative for chest pain.     Objective:   Vitals:   10/24/23 1441  BP: (!) 148/92  Pulse: 65  Temp: (!) 97.1 F (36.2 C)  SpO2: 99%  Weight: 217 lb 12.8 oz (98.8 kg)  Height: 5' 6 (1.676 m)   99% on RA BMI Readings from Last 3 Encounters:  10/24/23 35.15 kg/m  10/09/23 35.07 kg/m  09/11/23 33.75 kg/m   Wt Readings from Last 3 Encounters:  10/24/23 217 lb 12.8 oz (98.8 kg)  10/09/23 217 lb 4.8 oz (98.6 kg)  09/11/23 209 lb  2 oz (94.9 kg)    Physical Exam Constitutional:      General: She is not in acute distress.    Appearance: She is obese. She is not ill-appearing.  Neck:     Comments: Tracheostomy tube in place, insertion site is clean Cardiovascular:     Rate and Rhythm: Normal rate and regular rhythm.     Pulses: Normal pulses.     Heart sounds: Normal heart sounds.  Pulmonary:     Effort: Pulmonary effort is normal.     Breath sounds: Normal breath sounds. No wheezing or rales.  Neurological:     Mental Status: She is alert.     Ancillary Information    Past Medical History:  Diagnosis Date   Angio-edema    Anxiety    Cataract    CHF (congestive heart failure) (HCC)    Chronic heart failure with preserved ejection fraction (HCC)    Essential hypertension    GERD  (gastroesophageal reflux disease)    Hyperlipidemia    Hypothyroidism    SVT (supraventricular tachycardia) (HCC)    Tracheostomy in place [facility]) 01/2023   Urinary incontinence    Vitamin B 12 deficiency    Vitamin D  deficiency      Family History  Problem Relation Age of Onset   Transient ischemic attack Mother    Hypertension Mother    Hypertension Father    Colon cancer Neg Hx    Esophageal cancer Neg Hx    Rectal cancer Neg Hx    Stomach cancer Neg Hx      Past Surgical History:  Procedure Laterality Date   CATARACT EXTRACTION W/ INTRAOCULAR LENS IMPLANT Right 12/29/2016   Dr. Evalene Raw   CATARACT EXTRACTION W/ INTRAOCULAR LENS IMPLANT Left 01/15/2017   Dr. Evalene Bevis   COLONOSCOPY     INTUBATION-ENDOTRACHEAL WITH TRACHEOSTOMY STANDBY N/A 02/01/2023   Procedure: INTUBATION-ENDOTRACHEAL WITH TRACHEOSTOMY STANDBY;  Surgeon: Herminio Miu, MD;  Location: [facility] ORS;  Service: ENT;  Laterality: N/A;   LEEP     PEG TUBE PLACEMENT  01/2023   PEG TUBE REMOVAL  04/2023   TRACHEOSTOMY TUBE PLACEMENT N/A 02/06/2023   Procedure: TRACHEOSTOMY;  Surgeon: Milissa Hamming, MD;  Location: [facility] ORS;  Service: ENT;  Laterality: N/A;   TRACHEOSTOMY TUBE PLACEMENT N/A 06/26/2023   Procedure: TRACHEOSTOMY REVISON COMPLEX;  Surgeon: Herminio Miu, MD;  Location: [facility] ORS;  Service: ENT;  Laterality: N/A;   TUBAL LIGATION     VEIN LIGATION AND STRIPPING     x2    Social History   Socioeconomic History   Marital status: Married    Spouse name: Lynwood   Number of children: 2   Years of education: Not on file   Highest education level: Not on file  Occupational History   Not on file  Tobacco Use   Smoking status: Former    Current packs/day: 0.00    Types: Cigarettes    Quit date: 04/24/1996    Years since quitting: 27.5   Smokeless tobacco: Never  Vaping Use   Vaping status: Never Used  Substance and Sexual Activity   Alcohol use: Yes    Alcohol/week: 4.0 standard  drinks of alcohol    Types: 4 Shots of liquor per week    Comment: occasional   Drug use: No   Sexual activity: Not Currently  Other Topics Concern   Not on file  Social History Narrative   Not on file   Social Drivers of Health  Financial Resource Strain: Low Risk  (02/21/2022)   Overall Financial Resource Strain (CARDIA)    Difficulty of Paying Living Expenses: Not hard at all  Food Insecurity: No Food Insecurity (08/07/2023)   Hunger Vital Sign    Worried About Running Out of Food in the Last Year: Never true    Ran Out of Food in the Last Year: Never true  Transportation Needs: No Transportation Needs (08/07/2023)   PRAPARE - Administrator, Civil Service (Medical): No    Lack of Transportation (Non-Medical): No  Physical Activity: Insufficiently Active (02/21/2022)   Exercise Vital Sign    Days of Exercise per Week: 5 days    Minutes of Exercise per Session: 10 min  Stress: No Stress Concern Present (02/21/2022)   [employer] of Occupational Health - Occupational Stress Questionnaire    Feeling of Stress : Not at all  Social Connections: Patient Unable To Answer (06/26/2023)   Social Connection and Isolation Panel    Frequency of Communication with Friends and Family: Patient unable to answer    Frequency of Social Gatherings with Friends and Family: Patient unable to answer    Attends Religious Services: Patient unable to answer    Active Member of Clubs or Organizations: Patient unable to answer    Attends Banker Meetings: Patient unable to answer    Marital Status: Patient unable to answer  Intimate Partner Violence: Not At Risk (08/07/2023)   Humiliation, Afraid, Rape, and Kick questionnaire    Fear of Current or Ex-Partner: No    Emotionally Abused: No    Physically Abused: No    Sexually Abused: No     Allergies  Allergen Reactions   Cozaar  [Losartan ] Swelling   Eliquis  [Apixaban ] Swelling   Gelatin Anaphylaxis   Septra   [Sulfamethoxazole -Trimethoprim ] Anaphylaxis   Zinc Gelatin [Zinc] Anaphylaxis   Alpha-Gal    Amlodipine Besylate     REACTION: edema   Atorvastatin     REACTION: muscle pain   Beef Allergy    Cat Dander    Dust Mite Extract    Neomycin-Bacitracin Zn-Polymyx     REACTION: rash Per pt not sure if from meds or not   Latex      CBC    Component Value Date/Time   WBC 9.1 10/09/2023 1045   RBC 4.42 10/09/2023 1045   HGB 11.8 (L) 10/09/2023 1045   HGB WILL FOLLOW 06/08/2021 0000   HCT 37.9 10/09/2023 1045   HCT WILL FOLLOW 06/08/2021 0000   PLT 278 10/09/2023 1045   PLT WILL FOLLOW 06/08/2021 0000   MCV 85.7 10/09/2023 1045   MCV WILL FOLLOW 06/08/2021 0000   MCH 26.7 10/09/2023 1045   MCHC 31.1 10/09/2023 1045   RDW 24.4 (H) 10/09/2023 1045   RDW WILL FOLLOW 06/08/2021 0000   LYMPHSABS 2.6 10/09/2023 1045   LYMPHSABS WILL FOLLOW 06/08/2021 0000   MONOABS 0.8 10/09/2023 1045   EOSABS 0.2 10/09/2023 1045   EOSABS WILL FOLLOW 06/08/2021 0000   BASOSABS 0.0 10/09/2023 1045   BASOSABS WILL FOLLOW 06/08/2021 0000    Pulmonary Functions Testing Results:     No data to display          Outpatient Medications Prior to Visit  Medication Sig Dispense Refill   EPINEPHrine  0.3 mg/0.3 mL IJ SOAJ injection Inject 0.3 mg into the muscle as needed.     famotidine  (PEPCID ) 20 MG tablet Take 1 tablet (20 mg total) by mouth 2 (two) times daily.  180 tablet 2   fexofenadine (ALLEGRA) 180 MG tablet Take 360 mg by mouth 2 (two) times daily.     fluticasone  (FLONASE ) 50 MCG/ACT nasal spray Place 2 sprays into both nostrils daily as needed for rhinitis.     hydrALAZINE  (APRESOLINE ) 25 MG tablet Take 1 tablet (25 mg total) by mouth every 8 (eight) hours. (Patient taking differently: Take 25 mg by mouth 3 (three) times daily.) 270 tablet 1   ipratropium-albuterol  (DUONEB) 0.5-2.5 (3) MG/3ML SOLN Take 3 mLs by nebulization every 4 (four) hours as needed. 120 mL 1   iron  polysaccharides  (NIFEREX) 150 MG capsule Take 1 capsule (150 mg total) by mouth 2 (two) times daily. 60 capsule 1   metoprolol  tartrate (LOPRESSOR ) 25 MG tablet TAKE 1 TABLET BY MOUTH TWICE A DAY 180 tablet 3   pantoprazole  (PROTONIX ) 40 MG tablet Take 1 tablet (40 mg total) by mouth daily. In am at least 30 minutes before food/drink or medicines 90 tablet 3   promethazine -dextromethorphan (PROMETHAZINE -DM) 6.25-15 MG/5ML syrup Take 5 mLs by mouth at bedtime as needed for cough. Caution of sedation 118 mL 0   rosuvastatin  (CRESTOR ) 20 MG tablet TAKE 1 TABLET BY MOUTH DAILY 90 tablet 0   tiZANidine  (ZANAFLEX ) 4 MG tablet Take 1 tablet (4 mg total) by mouth every 8 (eight) hours as needed for muscle spasms (back pain/ spasm). Caution of sedation 20 tablet 0   vitamin B-12 (CYANOCOBALAMIN ) 500 MCG tablet Take 500 mcg by mouth daily.     No facility-administered medications prior to visit.

## 2023-10-29 ENCOUNTER — Inpatient Hospital Stay: Attending: Oncology

## 2023-10-29 VITALS — BP 156/75 | HR 69 | Temp 97.4°F | Resp 18

## 2023-10-29 DIAGNOSIS — D509 Iron deficiency anemia, unspecified: Secondary | ICD-10-CM | POA: Diagnosis not present

## 2023-10-29 DIAGNOSIS — D508 Other iron deficiency anemias: Secondary | ICD-10-CM

## 2023-10-29 MED ORDER — METHYLPREDNISOLONE SODIUM SUCC 125 MG IJ SOLR
40.0000 mg | INTRAMUSCULAR | Status: DC
  Administered 2023-10-29: 40 mg via INTRAVENOUS
  Filled 2023-10-29: qty 2

## 2023-10-29 MED ORDER — IRON SUCROSE 20 MG/ML IV SOLN
200.0000 mg | INTRAVENOUS | Status: DC
  Administered 2023-10-29: 200 mg via INTRAVENOUS
  Filled 2023-10-29: qty 10

## 2023-11-01 ENCOUNTER — Other Ambulatory Visit: Payer: Self-pay | Admitting: Family Medicine

## 2023-11-02 ENCOUNTER — Ambulatory Visit
Admission: RE | Admit: 2023-11-02 | Discharge: 2023-11-02 | Disposition: A | Discharge status: Home / Self Care | Source: Ambulatory Visit | Attending: Student in an Organized Health Care Education/Training Program | Admitting: Student in an Organized Health Care Education/Training Program

## 2023-11-02 ENCOUNTER — Ambulatory Visit
Admission: RE | Admit: 2023-11-02 | Discharge: 2023-11-02 | Disposition: A | Discharge status: Home / Self Care | Source: Ambulatory Visit | Attending: Student in an Organized Health Care Education/Training Program

## 2023-11-02 DIAGNOSIS — R0602 Shortness of breath: Secondary | ICD-10-CM | POA: Insufficient documentation

## 2023-11-02 DIAGNOSIS — I6523 Occlusion and stenosis of bilateral carotid arteries: Secondary | ICD-10-CM | POA: Diagnosis not present

## 2023-11-02 DIAGNOSIS — I700 Atherosclerosis of aorta: Secondary | ICD-10-CM | POA: Diagnosis not present

## 2023-11-02 DIAGNOSIS — Z930 Tracheostomy status: Secondary | ICD-10-CM | POA: Diagnosis not present

## 2023-11-02 DIAGNOSIS — J439 Emphysema, unspecified: Secondary | ICD-10-CM | POA: Diagnosis not present

## 2023-11-05 ENCOUNTER — Inpatient Hospital Stay

## 2023-11-05 ENCOUNTER — Ambulatory Visit: Payer: Self-pay | Admitting: Student in an Organized Health Care Education/Training Program

## 2023-11-05 VITALS — BP 147/75 | HR 64 | Temp 97.6°F | Resp 18

## 2023-11-05 DIAGNOSIS — D508 Other iron deficiency anemias: Secondary | ICD-10-CM

## 2023-11-05 DIAGNOSIS — D509 Iron deficiency anemia, unspecified: Secondary | ICD-10-CM | POA: Diagnosis not present

## 2023-11-05 MED ORDER — IRON SUCROSE 20 MG/ML IV SOLN
200.0000 mg | INTRAVENOUS | Status: DC
  Administered 2023-11-05: 200 mg via INTRAVENOUS
  Filled 2023-11-05: qty 10

## 2023-11-05 MED ORDER — METHYLPREDNISOLONE SODIUM SUCC 125 MG IJ SOLR
40.0000 mg | INTRAMUSCULAR | Status: DC
  Administered 2023-11-05: 40 mg via INTRAVENOUS
  Filled 2023-11-05: qty 2

## 2023-11-12 NOTE — Telephone Encounter (Signed)
 I have notified the patient's husband (DPR).  Nothing further needed.

## 2023-11-28 ENCOUNTER — Ambulatory Visit (INDEPENDENT_AMBULATORY_CARE_PROVIDER_SITE_OTHER): Admitting: Student in an Organized Health Care Education/Training Program

## 2023-11-28 ENCOUNTER — Encounter: Payer: Self-pay | Admitting: Student in an Organized Health Care Education/Training Program

## 2023-11-28 VITALS — BP 122/76 | HR 77 | Temp 97.5°F | Ht 66.0 in | Wt 218.8 lb

## 2023-11-28 DIAGNOSIS — J0410 Acute tracheitis without obstruction: Secondary | ICD-10-CM

## 2023-11-28 DIAGNOSIS — R0602 Shortness of breath: Secondary | ICD-10-CM | POA: Diagnosis not present

## 2023-11-28 DIAGNOSIS — Z930 Tracheostomy status: Secondary | ICD-10-CM

## 2023-11-28 MED ORDER — CEFPODOXIME PROXETIL 200 MG PO TABS: 200.0000 mg | ORAL_TABLET | Freq: Two times a day (BID) | ORAL | 0 refills | Status: AC

## 2023-11-28 NOTE — Progress Notes (Signed)
 Assessment & Plan:   1. Tracheostomy tube present (HCC) 2. Tracheitis (Primary) 3. 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 She is presenting for follow up after having her chest and neck CT. Patient is inquiring about tracheostomy tube removal.   Discussed with the patient that most of her symptoms are secondary to the tracheostomy tube.  Explained the risk of having catastrophic respiratory failure and inability to secure an airway secondary to her recurrent angioedema.  While she has not had an episode in a few months now, it remains to be seen if symptoms will recur with tapering down of her anti-histamines by her allergist. I would recommend maintaining the tracheostomy tube until at least her anti-histamine dose is stable and she does not show any recurrence of her underlying condition.  I again discussed that she should discuss tracheostomy decannulation with Dr. Herminio. Did discuss today that her chest and neck CT are re-assuring without any abnormal findings, and she could benefit from attempt at downsizing her tracheostomy tube as an initial step to help control her symptoms of cough. As to the foul smelling discharge from around the trach that she notices in the AM, I will prescribe a course of antibiotics for empiric treatment of tracheitis.  Finally, she will continue using her nebulizer therapy.   - cefpodoxime  (VANTIN ) 200 MG tablet; Take 1 tablet (200 mg total) by mouth 2 (two) times daily for 7 days.  Dispense: 14 tablet; Refill: 0 - Follow up with ENT   Return in about 3 months (around 02/28/2024).  I spent 32 minutes caring for this patient today, including preparing to see the patient, obtaining a medical history , reviewing a separately obtained history, performing a medically appropriate examination and/or evaluation,  counseling and educating the patient/family/caregiver, ordering medications, tests, or procedures, documenting clinical information in the electronic health record, and independently interpreting results (not separately reported/billed) and communicating results to the patient/family/caregiver  Belva November, MD [facility]   End of visit medications:  Meds ordered this encounter  Medications   cefpodoxime  (VANTIN ) 200 MG tablet    Sig: Take 1 tablet (200 mg total) by mouth 2 (two) times daily for 7 days.    Dispense:  14 tablet    Refill:  0     Current Outpatient Medications:    cefpodoxime  (VANTIN ) 200 MG tablet, Take 1 tablet (200 mg total) by mouth 2 (two) times daily for 7 days., Disp: 14 tablet, Rfl: 0   EPINEPHrine  0.3 mg/0.3 mL IJ SOAJ injection, Inject 0.3 mg into the muscle as needed., Disp: , Rfl:    famotidine  (PEPCID ) 20 MG tablet, Take 1 tablet (20 mg total) by mouth 2 (two) times daily., Disp: 180 tablet, Rfl: 2   fexofenadine (ALLEGRA) 180 MG tablet, Take 360 mg by mouth 2 (two) times daily., Disp: , Rfl:    fluticasone  (FLONASE ) 50 MCG/ACT nasal spray, USE 2 SPRAYS IN BOTH NOSTRILS  DAILY, Disp: 48 g, Rfl: 0   hydrALAZINE  (APRESOLINE ) 25 MG tablet, Take 1 tablet (25 mg total) by mouth every 8 (eight) hours. (Patient taking differently: Take 25 mg by mouth 3 (three) times daily.), Disp: 270 tablet, Rfl: 1   ipratropium-albuterol  (DUONEB) 0.5-2.5 (3) MG/3ML SOLN, Take 3 mLs by nebulization every 4 (four) hours as needed., Disp: 120 mL, Rfl: 1   iron  polysaccharides (NIFEREX) 150 MG capsule, Take  1 capsule (150 mg total) by mouth 2 (two) times daily., Disp: 60 capsule, Rfl: 1   metoprolol  tartrate (LOPRESSOR ) 25 MG tablet, TAKE 1 TABLET BY MOUTH TWICE A DAY, Disp: 180 tablet, Rfl: 3   pantoprazole  (PROTONIX ) 40 MG tablet, Take 1 tablet (40 mg total) by mouth daily. In am at least 30 minutes before food/drink or medicines, Disp: 90 tablet, Rfl: 3    rosuvastatin  (CRESTOR ) 20 MG tablet, TAKE 1 TABLET BY MOUTH DAILY, Disp: 90 tablet, Rfl: 0   vitamin B-12 (CYANOCOBALAMIN ) 500 MCG tablet, Take 500 mcg by mouth daily., Disp: , Rfl:    Subjective:   PATIENT ID: Sharon [name] GENDER: female DOB: Mar 26, 1951, MRN: [id number]  Chief Complaint  Patient presents with   Follow-up    Still has SOB. Cough. Congestion.    HPI  Patient is a 73 year old female with a past medical history of angioedema with recurrent respiratory failure status post tracheostomy tube placement presenting for follow up.   Patient has had multiple presentations and hospitalizations secondary to recurrent angioedema.  She has had 24 presentations to the ED and hospital since 2023 for angioedema.  She has required multiple intubations to secure her airway and to manage her hypoxic respiratory failure.   She was admitted in October 2024 with similar presentation.  During this presentation, she was noted to have significant angioedema in her mouth and tongue and while not in respiratory distress, she was at high risk for respiratory failure and loss of airway.  ENT was consulted and she underwent tracheostomy tube placement on 02/06/2023.  She since had her tracheostomy tube exchanged and currently has a cuffless Shiley.   Initial Visit 10/24/2023:  Presents today for the evaluation of the ostomy tube as well as a chief complaint of cough.  She reports that the cough has been present since her tracheostomy tube has been placed.  The cough is mostly dry but at times brings up some sputum.  She feels she is unable to sleep secondary to this. She is inquiring about the possibility of having the tracheostomy tube removed.  She has been followed closely by her ENT Dr. Herminio who has counseled her against decannulation.   Patient has established care with an allergist at [facility] where she was started on high-dose antihistamines.  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  Allergist is considering decreasing the dose of her antihistamines.  They have recommended she discuss tracheostomy tube removal with ENT.  Return Visit 11/28/2023:  Returns to clinic with no worsening symptoms. Continues to have a cough. Does report foul smelling yellowish discharge around the tracheostomy tube. She continues to have a cough. Reports no such symptoms when she had the 6.0 tracheostomy tube in place. Follow up with ENT is in a few weeks. She has not had any angioedema or ED trips since our last visit.  Ancillary information including prior medications, full medical/surgical/family/social histories, and PFTs (when available) are listed below and have been reviewed.   Review of Systems  Constitutional:  Negative for chills and fever.  HENT:         Discharge from around tracheostomy tube  Respiratory:  Positive for cough. Negative for hemoptysis, sputum production, shortness of breath and wheezing.   Cardiovascular:  Negative for chest pain.     Objective:   Vitals:   11/28/23 1119  BP: 122/76  Pulse: 77  Temp: (!) 97.5  F (36.4 C)  SpO2: 98%  Weight: 218 lb 12.8 oz (99.2 kg)  Height: 5' 6 (1.676 m)   98% on RA  BMI Readings from Last 3 Encounters:  11/28/23 35.32 kg/m  10/24/23 35.15 kg/m  10/09/23 35.07 kg/m   Wt Readings from Last 3 Encounters:  11/28/23 218 lb 12.8 oz (99.2 kg)  10/24/23 217 lb 12.8 oz (98.8 kg)  10/09/23 217 lb 4.8 oz (98.6 kg)    Physical Exam Constitutional:      General: She is not in acute distress.    Appearance: She is obese. She is not ill-appearing.  Neck:     Comments: Tracheostomy tube in place. Site is clean without erythema or discharge. Cardiovascular:     Rate and Rhythm: Normal rate and regular rhythm.     Pulses: Normal pulses.     Heart sounds: Normal heart sounds.  Pulmonary:     Effort: Pulmonary effort is normal.     Breath  sounds: Normal breath sounds. No wheezing or rales.  Neurological:     General: No focal deficit present.     Mental Status: She is alert and oriented to person, place, and time. Mental status is at baseline.       Ancillary Information    Past Medical History:  Diagnosis Date   Angio-edema    Anxiety    Cataract    CHF (congestive heart failure) (HCC)    Chronic heart failure with preserved ejection fraction (HCC)    Essential hypertension    GERD (gastroesophageal reflux disease)    Hyperlipidemia    Hypothyroidism    SVT (supraventricular tachycardia) (HCC)    Tracheostomy in place [facility]) 01/2023   Urinary incontinence    Vitamin B 12 deficiency    Vitamin D  deficiency      Family History  Problem Relation Age of Onset   Transient ischemic attack Mother    Hypertension Mother    Hypertension Father    Colon cancer Neg Hx    Esophageal cancer Neg Hx    Rectal cancer Neg Hx    Stomach cancer Neg Hx      Past Surgical History:  Procedure Laterality Date   CATARACT EXTRACTION W/ INTRAOCULAR LENS IMPLANT Right 12/29/2016   Dr. Evalene Raw   CATARACT EXTRACTION W/ INTRAOCULAR LENS IMPLANT Left 01/15/2017   Dr. Evalene Bevis   COLONOSCOPY     INTUBATION-ENDOTRACHEAL WITH TRACHEOSTOMY STANDBY N/A 02/01/2023   Procedure: INTUBATION-ENDOTRACHEAL WITH TRACHEOSTOMY STANDBY;  Surgeon: Herminio Miu, MD;  Location: [facility] ORS;  Service: ENT;  Laterality: N/A;   LEEP     PEG TUBE PLACEMENT  01/2023   PEG TUBE REMOVAL  04/2023   TRACHEOSTOMY TUBE PLACEMENT N/A 02/06/2023   Procedure: TRACHEOSTOMY;  Surgeon: Milissa Hamming, MD;  Location: [facility] ORS;  Service: ENT;  Laterality: N/A;   TRACHEOSTOMY TUBE PLACEMENT N/A 06/26/2023   Procedure: TRACHEOSTOMY REVISON COMPLEX;  Surgeon: Herminio Miu, MD;  Location: [facility] ORS;  Service: ENT;  Laterality: N/A;   TUBAL LIGATION     VEIN LIGATION AND STRIPPING     x2    Social History   Socioeconomic History   Marital  status: Married    Spouse name: Lynwood   Number of children: 2   Years of education: Not on file   Highest education level: Not on file  Occupational History   Not on file  Tobacco Use   Smoking status: Former    Current packs/day: 0.00  Types: Cigarettes    Quit date: 04/24/1996    Years since quitting: 27.6   Smokeless tobacco: Never  Vaping Use   Vaping status: Never Used  Substance and Sexual Activity   Alcohol use: Yes    Alcohol/week: 4.0 standard drinks of alcohol    Types: 4 Shots of liquor per week    Comment: occasional   Drug use: No   Sexual activity: Not Currently  Other Topics Concern   Not on file  Social History Narrative   Not on file   Social Drivers of Health   Financial Resource Strain: Low Risk  (02/21/2022)   Overall Financial Resource Strain (CARDIA)    Difficulty of Paying Living Expenses: Not hard at all  Food Insecurity: No Food Insecurity (08/07/2023)   Hunger Vital Sign    Worried About Running Out of Food in the Last Year: Never true    Ran Out of Food in the Last Year: Never true  Transportation Needs: No Transportation Needs (08/07/2023)   PRAPARE - Administrator, Civil Service (Medical): No    Lack of Transportation (Non-Medical): No  Physical Activity: Insufficiently Active (02/21/2022)   Exercise Vital Sign    Days of Exercise per Week: 5 days    Minutes of Exercise per Session: 10 min  Stress: No Stress Concern Present (02/21/2022)   [employer] of Occupational Health - Occupational Stress Questionnaire    Feeling of Stress : Not at all  Social Connections: Patient Unable To Answer (06/26/2023)   Social Connection and Isolation Panel    Frequency of Communication with Friends and Family: Patient unable to answer    Frequency of Social Gatherings with Friends and Family: Patient unable to answer    Attends Religious Services: Patient unable to answer    Active Member of Clubs or Organizations: Patient unable to answer     Attends Banker Meetings: Patient unable to answer    Marital Status: Patient unable to answer  Intimate Partner Violence: Not At Risk (08/07/2023)   Humiliation, Afraid, Rape, and Kick questionnaire    Fear of Current or Ex-Partner: No    Emotionally Abused: No    Physically Abused: No    Sexually Abused: No     Allergies  Allergen Reactions   Cozaar  [Losartan ] Swelling   Eliquis  [Apixaban ] Swelling   Gelatin Anaphylaxis   Septra  [Sulfamethoxazole -Trimethoprim ] Anaphylaxis   Zinc Gelatin [Zinc] Anaphylaxis   Alpha-Gal    Amlodipine Besylate     REACTION: edema   Atorvastatin     REACTION: muscle pain   Beef Allergy    Cat Dander    Dust Mite Extract    Neomycin-Bacitracin Zn-Polymyx     REACTION: rash Per pt not sure if from meds or not   Latex      CBC    Component Value Date/Time   WBC 9.1 10/09/2023 1045   RBC 4.42 10/09/2023 1045   HGB 11.8 (L) 10/09/2023 1045   HGB WILL FOLLOW 06/08/2021 0000   HCT 37.9 10/09/2023 1045   HCT WILL FOLLOW 06/08/2021 0000   PLT 278 10/09/2023 1045   PLT WILL FOLLOW 06/08/2021 0000   MCV 85.7 10/09/2023 1045   MCV WILL FOLLOW 06/08/2021 0000   MCH 26.7 10/09/2023 1045   MCHC 31.1 10/09/2023 1045   RDW 24.4 (H) 10/09/2023 1045   RDW WILL FOLLOW 06/08/2021 0000   LYMPHSABS 2.6 10/09/2023 1045   LYMPHSABS WILL FOLLOW 06/08/2021 0000   MONOABS 0.8  10/09/2023 1045   EOSABS 0.2 10/09/2023 1045   EOSABS WILL FOLLOW 06/08/2021 0000   BASOSABS 0.0 10/09/2023 1045   BASOSABS WILL FOLLOW 06/08/2021 0000    Pulmonary Functions Testing Results:     No data to display          Outpatient Medications Prior to Visit  Medication Sig Dispense Refill   EPINEPHrine  0.3 mg/0.3 mL IJ SOAJ injection Inject 0.3 mg into the muscle as needed.     famotidine  (PEPCID ) 20 MG tablet Take 1 tablet (20 mg total) by mouth 2 (two) times daily. 180 tablet 2   fexofenadine (ALLEGRA) 180 MG tablet Take 360 mg by mouth 2 (two) times  daily.     fluticasone  (FLONASE ) 50 MCG/ACT nasal spray USE 2 SPRAYS IN BOTH NOSTRILS  DAILY 48 g 0   hydrALAZINE  (APRESOLINE ) 25 MG tablet Take 1 tablet (25 mg total) by mouth every 8 (eight) hours. (Patient taking differently: Take 25 mg by mouth 3 (three) times daily.) 270 tablet 1   ipratropium-albuterol  (DUONEB) 0.5-2.5 (3) MG/3ML SOLN Take 3 mLs by nebulization every 4 (four) hours as needed. 120 mL 1   iron  polysaccharides (NIFEREX) 150 MG capsule Take 1 capsule (150 mg total) by mouth 2 (two) times daily. 60 capsule 1   metoprolol  tartrate (LOPRESSOR ) 25 MG tablet TAKE 1 TABLET BY MOUTH TWICE A DAY 180 tablet 3   pantoprazole  (PROTONIX ) 40 MG tablet Take 1 tablet (40 mg total) by mouth daily. In am at least 30 minutes before food/drink or medicines 90 tablet 3   rosuvastatin  (CRESTOR ) 20 MG tablet TAKE 1 TABLET BY MOUTH DAILY 90 tablet 0   vitamin B-12 (CYANOCOBALAMIN ) 500 MCG tablet Take 500 mcg by mouth daily.     promethazine -dextromethorphan (PROMETHAZINE -DM) 6.25-15 MG/5ML syrup Take 5 mLs by mouth at bedtime as needed for cough. Caution of sedation 118 mL 0   tiZANidine  (ZANAFLEX ) 4 MG tablet Take 1 tablet (4 mg total) by mouth every 8 (eight) hours as needed for muscle spasms (back pain/ spasm). Caution of sedation 20 tablet 0   No facility-administered medications prior to visit.

## 2023-12-01 ENCOUNTER — Other Ambulatory Visit: Payer: Self-pay | Admitting: Family Medicine

## 2023-12-18 ENCOUNTER — Encounter: Payer: Self-pay | Admitting: Family Medicine

## 2023-12-18 ENCOUNTER — Ambulatory Visit (INDEPENDENT_AMBULATORY_CARE_PROVIDER_SITE_OTHER): Admitting: Family Medicine

## 2023-12-18 VITALS — BP 124/82 | HR 61 | Temp 97.8°F | Ht 66.0 in | Wt 220.2 lb

## 2023-12-18 DIAGNOSIS — T781XXA Other adverse food reactions, not elsewhere classified, initial encounter: Secondary | ICD-10-CM

## 2023-12-18 DIAGNOSIS — R300 Dysuria: Secondary | ICD-10-CM

## 2023-12-18 DIAGNOSIS — N3000 Acute cystitis without hematuria: Secondary | ICD-10-CM | POA: Diagnosis not present

## 2023-12-18 DIAGNOSIS — R600 Localized edema: Secondary | ICD-10-CM

## 2023-12-18 DIAGNOSIS — R351 Nocturia: Secondary | ICD-10-CM | POA: Diagnosis not present

## 2023-12-18 DIAGNOSIS — N390 Urinary tract infection, site not specified: Secondary | ICD-10-CM | POA: Insufficient documentation

## 2023-12-18 LAB — POC URINALSYSI DIPSTICK (AUTOMATED)
Bilirubin, UA: NEGATIVE
Blood, UA: NEGATIVE
Glucose, UA: NEGATIVE
Ketones, UA: NEGATIVE
Nitrite, UA: POSITIVE — AB
Protein, UA: POSITIVE — AB
Spec Grav, UA: 1.015 (ref 1.010–1.025)
Urobilinogen, UA: 0.2 U/dL
pH, UA: 6 (ref 5.0–8.0)

## 2023-12-18 MED ORDER — NITROFURANTOIN MONOHYD MACRO 100 MG PO CAPS: 100.0000 mg | ORAL_CAPSULE | Freq: Two times a day (BID) | ORAL | 0 refills | Status: AC

## 2023-12-18 NOTE — Assessment & Plan Note (Signed)
 Chronic  Also freq urination at night - and incontinence /has to wear undergarment for this  Interested in urology referral in [place]   Has had several utis and has one now  Culture pending

## 2023-12-18 NOTE — Assessment & Plan Note (Signed)
 Pt notes she is not actually allergic to alpha gal now

## 2023-12-18 NOTE — Patient Instructions (Signed)
 Drink lots of water   Take the generic macrobid  as directed   Urine culture is pending- please call if symptoms worsen before we reach out with that result    I put the referral in for urology in [facility]  Please let us  know if you don't hear in 1-2 weeks to set that up (mychart message or call or letter)

## 2023-12-18 NOTE — Assessment & Plan Note (Signed)
 Likely due to hydralazine  Noted today

## 2023-12-18 NOTE — Assessment & Plan Note (Signed)
 Per urology notes in [place] - OAB wet  Had PTNS which did not help   Pt has frequent urination and incontinence at night/has to wear adult undergarment   Pt would like to see urologist in [place]  Referral done

## 2023-12-18 NOTE — Assessment & Plan Note (Signed)
 Pt has bmi of 35.55  Given her dx of prediabetes and HTN- qualifies for morbid obesity   Has been difficult to deal with weight in light of chronic health problems lately

## 2023-12-18 NOTE — Progress Notes (Signed)
 Subjective:    Patient ID: Sharon [name], female    DOB: 1950-08-18, 73 y.o.   MRN: [id number]  HPI  Wt Readings from Last 3 Encounters:  12/18/23 220 lb 4 oz (99.9 kg)  11/28/23 218 lb 12.8 oz (99.2 kg)  10/24/23 217 lb 12.8 oz (98.8 kg)   35.55 kg/m  Vitals:   12/18/23 0820  BP: 124/82  Pulse: 61  Temp: 97.8 F (36.6 C)  SpO2: 97%     Pt presents for urinary symptoms  Acute on chronic dysuria     Burning at end of urination  Ongoing but worsened last week  Husband thinks her night time incontinence and frequency at night  Is drinking lots of water    No fever  No n/v  No flank pain  No blood in urine  Some odor to urine     Lab Results  Component Value Date   NA 137 07/26/2023   K 4.0 07/26/2023   CO2 27 07/26/2023   GLUCOSE 99 07/26/2023   BUN 9 07/26/2023   CREATININE 0.90 07/26/2023   CALCIUM  9.3 07/26/2023   GFR 63.86 07/26/2023   GFRNONAA >60 06/27/2023      Treated with Vantin  earlier this month for tracheitis   Results for orders placed or performed in visit on 12/18/23  POCT Urinalysis Dipstick (Automated)   Collection Time: 12/18/23  8:36 AM  Result Value Ref Range   Color, UA Yellow    Clarity, UA Cloudy    Glucose, UA Negative Negative   Bilirubin, UA Negative    Ketones, UA Negative    Spec Grav, UA 1.015 1.010 - 1.025   Blood, UA Negative    pH, UA 6.0 5.0 - 8.0   Protein, UA Positive (A) Negative   Urobilinogen, UA 0.2 0.2 or 1.0 E.U./dL   Nitrite, UA Positive (A)    Leukocytes, UA Large (3+) (A) Negative    Per urology [facility] from [facility] in 2024 Dx with OAB wet with mixed urge and stress  Had PTNS -unsue if helped    Patient Active Problem List   Diagnosis Date Noted   UTI (urinary tract infection) 12/18/2023   Frequent urination at night 12/18/2023   Thoracic back pain 08/15/2023   Iron  deficiency anemia 08/07/2023   Airway obstruction 06/26/2023   Tracheostomy tube present (HCC) 06/26/2023   Brain fog  05/15/2023   S/P percutaneous endoscopic gastrostomy (PEG) tube placement (HCC) 05/15/2023   Tracheostomy status (HCC) 05/15/2023   Allergic reaction to alpha-gal 04/04/2023   Chronic diastolic heart failure (HCC) 01/09/2023   Snoring 01/09/2023   Vitamin B12 deficiency 01/09/2023   Current use of proton pump inhibitor 01/01/2023   Dysuria 06/11/2022   Angioedema due to angiotensin converting enzyme inhibitor (ACE-I) 02/27/2022   SVT (supraventricular tachycardia) (HCC) 02/22/2022   Urinary incontinence 12/09/2021   Pedal edema 07/26/2021   Angioedema 04/17/2021   Dizziness 08/23/2020   Fatigue 08/23/2020   Prediabetes 12/17/2019   Medicare annual wellness visit, subsequent 08/28/2018   Cough 01/28/2018   Welcome to Medicare preventive visit 08/22/2016   Obesity, morbid (HCC) 10/05/2014   Anemia 09/24/2013   Palpitations 03/03/2013   Colon cancer screening 03/03/2013   Hypokalemia 10/09/2011   Routine general medical examination at a health care facility 10/02/2011   Vitamin D  deficiency disease 12/28/2010   ARTHRALGIA 08/19/2007   Hypothyroidism 01/31/2007   Hyperlipidemia 01/31/2007   GAD (generalized anxiety disorder) 01/31/2007   Essential hypertension 01/31/2007   GERD 01/31/2007  Past Medical History:  Diagnosis Date   Angio-edema    Anxiety    Cataract    CHF (congestive heart failure) (HCC)    Chronic heart failure with preserved ejection fraction (HCC)    Essential hypertension    GERD (gastroesophageal reflux disease)    Hyperlipidemia    Hypothyroidism    SVT (supraventricular tachycardia) (HCC)    Tracheostomy in place [facility]) 01/2023   Urinary incontinence    Vitamin B 12 deficiency    Vitamin D  deficiency    Past Surgical History:  Procedure Laterality Date   CATARACT EXTRACTION W/ INTRAOCULAR LENS IMPLANT Right 12/29/2016   Dr. Evalene Raw   CATARACT EXTRACTION W/ INTRAOCULAR LENS IMPLANT Left 01/15/2017   Dr. Evalene Bevis   COLONOSCOPY      INTUBATION-ENDOTRACHEAL WITH TRACHEOSTOMY STANDBY N/A 02/01/2023   Procedure: INTUBATION-ENDOTRACHEAL WITH TRACHEOSTOMY STANDBY;  Surgeon: Herminio Miu, MD;  Location: [facility] ORS;  Service: ENT;  Laterality: N/A;   LEEP     PEG TUBE PLACEMENT  01/2023   PEG TUBE REMOVAL  04/2023   TRACHEOSTOMY TUBE PLACEMENT N/A 02/06/2023   Procedure: TRACHEOSTOMY;  Surgeon: Milissa Hamming, MD;  Location: [facility] ORS;  Service: ENT;  Laterality: N/A;   TRACHEOSTOMY TUBE PLACEMENT N/A 06/26/2023   Procedure: TRACHEOSTOMY REVISON COMPLEX;  Surgeon: Herminio Miu, MD;  Location: [facility] ORS;  Service: ENT;  Laterality: N/A;   TUBAL LIGATION     VEIN LIGATION AND STRIPPING     x2   Social History   Tobacco Use   Smoking status: Former    Current packs/day: 0.00    Types: Cigarettes    Quit date: 04/24/1996    Years since quitting: 27.6   Smokeless tobacco: Never  Vaping Use   Vaping status: Never Used  Substance Use Topics   Alcohol use: Yes    Alcohol/week: 4.0 standard drinks of alcohol    Types: 4 Shots of liquor per week    Comment: occasional   Drug use: No   Family History  Problem Relation Age of Onset   Transient ischemic attack Mother    Hypertension Mother    Hypertension Father    Colon cancer Neg Hx    Esophageal cancer Neg Hx    Rectal cancer Neg Hx    Stomach cancer Neg Hx    Allergies  Allergen Reactions   Cozaar  [Losartan ] Swelling   Eliquis  [Apixaban ] Swelling   Gelatin Anaphylaxis   Septra  [Sulfamethoxazole -Trimethoprim ] Anaphylaxis   Zinc Gelatin [Zinc] Anaphylaxis   Alpha-Gal    Amlodipine Besylate     REACTION: edema   Atorvastatin     REACTION: muscle pain   Beef Allergy    Cat Dander    Dust Mite Extract    Neomycin-Bacitracin Zn-Polymyx     REACTION: rash Per pt not sure if from meds or not   Latex    Current Outpatient Medications on File Prior to Visit  Medication Sig Dispense Refill   EPINEPHrine  0.3 mg/0.3 mL IJ SOAJ injection Inject 0.3 mg into  the muscle as needed.     famotidine  (PEPCID ) 20 MG tablet Take 1 tablet (20 mg total) by mouth 2 (two) times daily. 180 tablet 2   fexofenadine (ALLEGRA) 180 MG tablet Take 360 mg by mouth 2 (two) times daily.     fluticasone  (FLONASE ) 50 MCG/ACT nasal spray USE 2 SPRAYS IN BOTH NOSTRILS  DAILY 48 g 0   hydrALAZINE  (APRESOLINE ) 25 MG tablet TAKE 1 TABLET BY MOUTH EVERY 8  HOURS. 270 tablet 1   ipratropium-albuterol  (DUONEB) 0.5-2.5 (3) MG/3ML SOLN Take 3 mLs by nebulization every 4 (four) hours as needed. 120 mL 1   iron  polysaccharides (NIFEREX) 150 MG capsule Take 1 capsule (150 mg total) by mouth 2 (two) times daily. 60 capsule 1   metoprolol  tartrate (LOPRESSOR ) 25 MG tablet TAKE 1 TABLET BY MOUTH TWICE A DAY 180 tablet 3   pantoprazole  (PROTONIX ) 40 MG tablet Take 1 tablet (40 mg total) by mouth daily. In am at least 30 minutes before food/drink or medicines 90 tablet 3   rosuvastatin  (CRESTOR ) 20 MG tablet TAKE 1 TABLET BY MOUTH DAILY 90 tablet 0   vitamin B-12 (CYANOCOBALAMIN ) 500 MCG tablet Take 500 mcg by mouth daily.     No current facility-administered medications on file prior to visit.    Review of Systems  Constitutional:  Negative for activity change, appetite change, fatigue, fever and unexpected weight change.  HENT:  Negative for congestion, rhinorrhea, sore throat and trouble swallowing.   Eyes:  Negative for pain, redness, itching and visual disturbance.  Respiratory:  Negative for cough, chest tightness, shortness of breath and wheezing.   Cardiovascular:  Negative for chest pain and palpitations.  Gastrointestinal:  Negative for abdominal pain, blood in stool, constipation, diarrhea and nausea.  Endocrine: Negative for cold intolerance, heat intolerance, polydipsia and polyuria.  Genitourinary:  Positive for dysuria, frequency and urgency. Negative for difficulty urinating and hematuria.  Musculoskeletal:  Negative for arthralgias, joint swelling and myalgias.       No  flank pain   Skin:  Negative for pallor and rash.  Neurological:  Negative for dizziness, tremors, weakness, numbness and headaches.  Hematological:  Negative for adenopathy. Does not bruise/bleed easily.  Psychiatric/Behavioral:  Negative for decreased concentration and dysphoric mood. The patient is not nervous/anxious.        Objective:   Physical Exam Constitutional:      General: She is not in acute distress.    Appearance: Normal appearance. She is well-developed. She is obese. She is not ill-appearing or diaphoretic.  HENT:     Head: Normocephalic and atraumatic.  Eyes:     Conjunctiva/sclera: Conjunctivae normal.     Pupils: Pupils are equal, round, and reactive to light.  Neck:     Thyroid : No thyromegaly.     Vascular: No carotid bruit or JVD.     Comments: Trache in place Cardiovascular:     Rate and Rhythm: Normal rate and regular rhythm.     Heart sounds: Normal heart sounds.     No gallop.  Pulmonary:     Effort: Pulmonary effort is normal. No respiratory distress.     Breath sounds: Normal breath sounds. No wheezing or rales.  Abdominal:     General: There is no distension or abdominal bruit.     Palpations: Abdomen is soft. There is no mass.     Tenderness: There is no abdominal tenderness. There is no right CVA tenderness, left CVA tenderness, guarding or rebound.     Comments: No suprapubic tenderness or fullness    Musculoskeletal:     Cervical back: Normal range of motion and neck supple.     Right lower leg: Edema present.     Left lower leg: Edema present.     Comments: Trace pedal edema   Lymphadenopathy:     Cervical: No cervical adenopathy.  Skin:    General: Skin is warm and dry.     Coloration: Skin is  not pale.     Findings: No rash.  Neurological:     Mental Status: She is alert.     Coordination: Coordination normal.     Deep Tendon Reflexes: Reflexes are normal and symmetric. Reflexes normal.  Psychiatric:        Mood and Affect: Mood  normal.           Assessment & Plan:   Problem List Items Addressed This Visit       Genitourinary   UTI (urinary tract infection) - Primary   Acute on chronic dysuria (frequency at night and incontinence)  Positive urinalysis  Took vantin  earlier this mo for tracheitis and it did not help urinary symptoms   Will treat with macrobid  Pending culture Instructed to call if symptoms worsen  Instructed to drink water  Handout given  Call back and Er precautions noted in detail today        Relevant Medications   nitrofurantoin , macrocrystal-monohydrate, (MACROBID ) 100 MG capsule   Other Relevant Orders   Urine Culture     Other   Pedal edema   Likely due to hydralazine  Noted today      Obesity, morbid (HCC)   Pt has bmi of 35.55  Given her dx of prediabetes and HTN- qualifies for morbid obesity   Has been difficult to deal with weight in light of chronic health problems lately      Frequent urination at night   Per urology notes in [place] - OAB wet  Had PTNS which did not help   Pt has frequent urination and incontinence at night/has to wear adult undergarment   Pt would like to see urologist in [facility]  Referral done         Relevant Orders   Ambulatory referral to Urology   Dysuria   Chronic  Also freq urination at night - and incontinence /has to wear undergarment for this  Interested in urology referral in [place]   Has had several utis and has one now  Culture pending       Relevant Orders   POCT Urinalysis Dipstick (Automated) (Completed)   Ambulatory referral to Urology   Allergic reaction to alpha-gal   Pt notes she is not actually allergic to alpha gal now

## 2023-12-18 NOTE — Assessment & Plan Note (Signed)
 Acute on chronic dysuria (frequency at night and incontinence)  Positive urinalysis  Took vantin  earlier this mo for tracheitis and it did not help urinary symptoms   Will treat with macrobid  Pending culture Instructed to call if symptoms worsen  Instructed to drink water  Handout given  Call back and Er precautions noted in detail today

## 2023-12-20 ENCOUNTER — Ambulatory Visit: Payer: Self-pay | Admitting: Family Medicine

## 2023-12-20 LAB — URINE CULTURE
MICRO NUMBER:: 16884574
SPECIMEN QUALITY:: ADEQUATE

## 2023-12-28 ENCOUNTER — Other Ambulatory Visit: Payer: Self-pay | Admitting: Family Medicine

## 2024-01-07 ENCOUNTER — Inpatient Hospital Stay: Attending: Oncology

## 2024-01-07 DIAGNOSIS — D509 Iron deficiency anemia, unspecified: Secondary | ICD-10-CM | POA: Diagnosis not present

## 2024-01-07 DIAGNOSIS — D508 Other iron deficiency anemias: Secondary | ICD-10-CM

## 2024-01-07 LAB — CBC (CANCER CENTER ONLY)
HCT: 42.9 % (ref 36.0–46.0)
Hemoglobin: 13.9 g/dL (ref 12.0–15.0)
MCH: 31 pg (ref 26.0–34.0)
MCHC: 32.4 g/dL (ref 30.0–36.0)
MCV: 95.5 fL (ref 80.0–100.0)
Platelet Count: 259 K/uL (ref 150–400)
RBC: 4.49 MIL/uL (ref 3.87–5.11)
RDW: 13.8 % (ref 11.5–15.5)
WBC Count: 7.6 K/uL (ref 4.0–10.5)
nRBC: 0 % (ref 0.0–0.2)

## 2024-01-07 LAB — IRON AND TIBC
Iron: 95 ug/dL (ref 28–170)
Saturation Ratios: 27 % (ref 10.4–31.8)
TIBC: 350 ug/dL (ref 250–450)
UIBC: 255 ug/dL

## 2024-01-07 LAB — FERRITIN: Ferritin: 125 ng/mL (ref 11–307)

## 2024-01-08 DIAGNOSIS — T783XXA Angioneurotic edema, initial encounter: Secondary | ICD-10-CM | POA: Diagnosis not present

## 2024-01-08 DIAGNOSIS — Z430 Encounter for attention to tracheostomy: Secondary | ICD-10-CM | POA: Diagnosis not present

## 2024-01-08 DIAGNOSIS — R059 Cough, unspecified: Secondary | ICD-10-CM | POA: Diagnosis not present

## 2024-01-12 NOTE — Progress Notes (Unsigned)
 Cardiology Clinic Note   Date: 01/16/2024 ID: KIMORI TARTAGLIA, DOB Dec 28, 1950, MRN [id number]  Primary Cardiologist:  Evalene Lunger, MD  Chief Complaint   Sharon [name] is a 73 y.o. female who presents to the clinic today for routine follow up.   Patient Profile   Sharon [name] is followed by Dr. Gollan for the history outlined below.      Past medical history significant for: Palpitations/SVT. 14-day ZIO 05/28/2023: HR 48 to 145 bpm, average 73 bpm.  Predominantly sinus rhythm.  6 runs of SVT fastest 5 beats max rate 145 bpm, longest 14 beats average rate 118 bpm.  Rare ectopy.  No A-fib noted. Chronic diastolic heart failure. Echo 06/25/2023: EF 60 to 65%.  No RWMA.  Mild LVH.  Grade I DD.  Normal RV size/function.  Aortic valve sclerosis/calcification without stenosis. Hypertension. Hyperlipidemia. Lipid panel 01/02/2023: LDL 46, HDL 61, TG 241, total 144. GERD. Hypothyroidism. Angioedema/alpha gal.  In summary, patient was first evaluated by Dr. Gollan on 09/21/2020 for lightheadedness and presyncope at the request of Dr. Randeen.  Patient reported history of mitral valve prolapse in the 1980s.  She reported episodes of gait instability and lightheadedness.  No documented orthostasis.  It was felt her symptoms were due to general deconditioning gait instability.  Cardiac testing was deferred in favor of beginning a regular walking program.  She was seen in follow-up August 2023 for complaint of palpitations.  She wore a 14-day ZIO which showed a 2 runs of SVT as detailed above.  Echo showed normal LV/RV function as detailed above.  She was started on Lopressor  and referred to EP.  Patient was last seen in the office by Dr. Gollan on 05/08/2022 for 47-month follow-up.  She continued to complain of lightheadedness and neurology referral was recommended.  Patient's husband reported lightheadedness worse after intubation with general anesthesia for 4 days.  She did not want to use  cane for stability.  Walking program and physical therapy for gait stability was recommended.  Zetia  was added for hyperlipidemia.  Patient underwent hospital admission to [facility] from 02/01/2023 to 02/09/2023 for recurrent angioedema.  She reported gradual tongue swelling and shortness of breath unresponsive to IM epinephrine .  She was taken emergently to the OR for transnasal endotracheal intubation.  She underwent tracheostomy on 02/06/2023.  She was discharged to LTAC on 02/09/2023.  Patient was evaluated in the ED on 05/12/2023 for trach tube dislodgment.  Respiratory therapy cleaned and adjusted tracheostomy with improvement in symptoms.  She was treated for a UTI and discharged follow-up with PCP.  Patient presented to [facility] ED on 05/22/2023 with tongue swelling.  She was treated with steroids and epinephrine  with complete resolution of symptoms.  She was discharged home the same day.   Patient was seen in the office on 05/28/2023 for evaluation of tachycardia.  Patient reported palpitations described as heart racing and skipping for years but became concerned when she read in her chart that she has heart failure.  Heart rate stays elevated most of the time generally > 100 bpm with dips into the 90s.  She finds it most bothersome at night when she is trying to sleep.  She reported associated dyspnea.  During her recent hospital admission she had an EKG that demonstrated A-fib.  She was started on Eliquis  and carvedilol  and 14-day ZIO was ordered.  Unfortunately after starting Eliquis  she developed tongue swelling and diarrhea and was treated in the ED.  Given her reaction she  was instructed to stop Eliquis  and return to prior dose of metoprolol  tartrate.  Patient treated in the ED on 06/08/2023 for allergic reaction.  On 06/26/2023 she underwent revision of her tracheostomy.   Patient was last seen in the office by me on 07/10/2023 for follow-up after testing.  She was stable at that time.  No medication  changes were made.     History of Present Illness    Today, patient is accompanied by her husband. She reports increased lower extremity edema that started at the beginning of the summer. Edema does not improve overnight. She tried compression socks but it did not seem to help. She reported mild improvement when the weather cooled off. She denies increased shortness of breath or DOE. No orthopnea or PND. No chest pain, pressure or tightness. No palpitations. She has not had an allergic reaction in several months. She is hoping if she continues to not have allergic reactions she can get trach reversed.     ROS: All other systems reviewed and are otherwise negative except as noted in History of Present Illness.  EKGs/Labs Reviewed    EKG Interpretation Date/Time:  Wednesday January 16 2024 11:15:21 EDT Ventricular Rate:  83 PR Interval:  174 QRS Duration:  72 QT Interval:  388 QTC Calculation: 455 R Axis:   20  Text Interpretation: Normal sinus rhythm Normal ECG When compared with ECG of 10-Jul-2023 11:07, No significant change was found Confirmed by Loistine Sober [phone]) on 01/16/2024 11:25:40 AM   07/26/2023: ALT 5; AST 13; BUN 9; Creatinine, Ser 0.90; Potassium 4.0; Sodium 137   01/07/2024: Hemoglobin 13.9; WBC Count 7.6   05/15/2023: TSH 2.39    Physical Exam    VS:  BP 130/80   Pulse 83   Ht 5' 6 (1.676 m)   Wt 223 lb (101.2 kg)   SpO2 97%   BMI 35.99 kg/m  , BMI Body mass index is 35.99 kg/m.  GEN: Well nourished, well developed, in no acute distress. Neck: No JVD or carotid bruits. Cardiac:  RRR.  No murmur. No rubs or gallops.   Respiratory:  Respirations regular and unlabored. Clear to auscultation without rales, wheezing or rhonchi. GI: Soft, nontender, nondistended. Extremities: Radials/DP/PT 2+ and equal bilaterally. No clubbing or cyanosis. 1+ pitting edema bilateral lower extremities.   Skin: Warm and dry, no rash. Neuro: Strength intact.  Assessment  & Plan   Palpitations/SVT 14-day ZIO February 2025 showed 6 runs of SVT, no A-fib noted.  Patient denies palpitations. EKG demonstrated NSR.  -Continue metoprolol .   Chronic diastolic heart failure/lower extremity edema Echo March 2025 showed normal LV/RV function, mild LVH, Grade I DD, no significant valvular abnormalities.  Patient reports lower extremity edema worsened since summer started. She tried compression socks but they did not help. She reports some improvement when the weather cooled off. Edema is unchanged overnight.  No orthopnea or PND.  She denies increased shortness of breath. 1+ pitting edema bilateral lower extremities otherwise euvolemic and well compensated on exam. - Continue hydralazine , metoprolol . - Lasix  20 mg daily x 5 days and prn thereafter.  - BMP today. May need to add potassium based on results.  - BMP in 1 week.    Hypertension BP today 130/80. She has chronic lightheadedness and dizziness.  -Continue hydralazine , metoprolol .  Disposition: Lasix  20 mg x 5 days then prn thereafter. BMP today and repeat in 1 week. Return in 3 months or sooner as needed.  Signed, Barnie HERO. Beren Yniguez, DNP, NP-C

## 2024-01-15 DIAGNOSIS — Z930 Tracheostomy status: Secondary | ICD-10-CM | POA: Diagnosis not present

## 2024-01-16 ENCOUNTER — Encounter: Payer: Self-pay | Admitting: Student

## 2024-01-16 ENCOUNTER — Ambulatory Visit: Attending: Student | Admitting: Student

## 2024-01-16 VITALS — BP 130/80 | HR 83 | Ht 66.0 in | Wt 223.0 lb

## 2024-01-16 DIAGNOSIS — I10 Essential (primary) hypertension: Secondary | ICD-10-CM | POA: Diagnosis not present

## 2024-01-16 DIAGNOSIS — Z79899 Other long term (current) drug therapy: Secondary | ICD-10-CM | POA: Diagnosis not present

## 2024-01-16 DIAGNOSIS — I5032 Chronic diastolic (congestive) heart failure: Secondary | ICD-10-CM | POA: Diagnosis not present

## 2024-01-16 DIAGNOSIS — I471 Supraventricular tachycardia, unspecified: Secondary | ICD-10-CM

## 2024-01-16 DIAGNOSIS — R600 Localized edema: Secondary | ICD-10-CM

## 2024-01-16 DIAGNOSIS — R002 Palpitations: Secondary | ICD-10-CM | POA: Diagnosis not present

## 2024-01-16 MED ORDER — FUROSEMIDE 20 MG PO TABS: 20.0000 mg | ORAL_TABLET | Freq: Every day | ORAL | 0 refills | Status: DC

## 2024-01-16 MED ORDER — FUROSEMIDE 20 MG PO TABS: 20.0000 mg | ORAL_TABLET | Freq: Every day | ORAL | 3 refills | Status: AC | PRN

## 2024-01-16 NOTE — Patient Instructions (Signed)
 Medication Instructions:  Your physician recommends the following medication changes.  START TAKING: Furosemide  20 mg once daily for 5 days then; Furosemide  20 mg once daily as needed for weight gain of 3 lbs or one in one day OR of 5 lbs or more in one week   *If you need a refill on your cardiac medications before your next appointment, please call your pharmacy*  Lab Work: Your provider would like for you to have following labs drawn today [facility].    Your provider would like for you to return in 12 Days (Monday, 01/28/24)  to have the following labs drawn: [facility].   Please go to [facility] [address] ([facility] [address], [place] [address] You do not need an appointment.  They are open from 8 am- 4:30 pm.  Lunch from 1:00 pm- 2:00 pm You Do Not need to be fasting.   You may also go to one of the following [facility]:  [address] [place], [place] [address] Phone: [phone] Lab hours: Mon-Fri 8 am- 5 pm    Lunch 12 pm- 1 pm  [address] [place],  [place]  [address]  [place]  Phone: [phone] Lab hours: 7 am- 4 pm Lunch 12 pm-1 pm   [address] [place],  [place]  [address]  [place]  Phone: [phone] Lab hours: Mon-Fri 8 am- 5 pm    Lunch 12 pm- 1 pm  If you have labs (blood work) drawn today and your tests are completely normal, you will receive your results only by: MyChart Message (if you have MyChart) OR A paper copy in the mail If you have any lab test that is abnormal or we need to change your treatment, we will call you to review the results.  Testing/Procedures: None ordered at this time   Follow-Up: At [facility], you and your health needs are our priority.  As part of our continuing mission to provide you with exceptional heart care, our providers are all part of one team.  This team includes your primary Cardiologist (physician) and Advanced Practice Providers or APPs (Physician Assistants and Nurse Practitioners) who all work  together to provide you with the care you need, when you need it.  Your next appointment:   3 month(s)  Provider:   Timothy Gollan, MD or Barnie Hila, NP    We recommend signing up for the patient portal called MyChart.  Sign up information is provided on this After Visit Summary.  MyChart is used to connect with patients for Virtual Visits (Telemedicine).  Patients are able to view lab/test results, encounter notes, upcoming appointments, etc.  Non-urgent messages can be sent to your provider as well.   To learn more about what you can do with MyChart, go to [email or web address].

## 2024-01-17 ENCOUNTER — Ambulatory Visit: Payer: Self-pay | Admitting: Student

## 2024-01-17 DIAGNOSIS — Z79899 Other long term (current) drug therapy: Secondary | ICD-10-CM

## 2024-01-17 LAB — BASIC METABOLIC PANEL WITH GFR
BUN/Creatinine Ratio: 10 — ABNORMAL LOW (ref 12–28)
BUN: 17 mg/dL (ref 8–27)
CO2: 23 mmol/L (ref 20–29)
Calcium: 9.9 mg/dL (ref 8.7–10.3)
Chloride: 103 mmol/L (ref 96–106)
Creatinine, Ser: 1.72 mg/dL — ABNORMAL HIGH (ref 0.57–1.00)
Glucose: 82 mg/dL (ref 70–99)
Potassium: 5.1 mmol/L (ref 3.5–5.2)
Sodium: 141 mmol/L (ref 134–144)
eGFR: 31 mL/min/1.73 — ABNORMAL LOW (ref >59)

## 2024-01-17 NOTE — Telephone Encounter (Signed)
-----   Message from Barnie Hila sent at 01/17/2024  6:46 AM EDT ----- Please let patient know that her kidney function is significantly down from five months ago. I would like her to hold off on taking Lasix  as we discussed yesterday. She can try limiting sodium,  compression socks, and elevation to manage edema for now. I would like her to increase hydration. Repeat BMP next week as previously discussed. If kidney function is not improved we will consider  referral to nephrology.   Thank you!  DW  ----- Message ----- From: Rebecka Memos Lab Results In Sent: 01/17/2024   5:36 AM EDT To: Barnie Hila, NP

## 2024-01-17 NOTE — Telephone Encounter (Signed)
 Husband was returning your call. Please advise

## 2024-01-17 NOTE — Telephone Encounter (Signed)
 Called and spoke with the patient's husband after he had returned my earlier calls.  Relayed the lab results and recommendations from Barnie Hila, NP of the decrease in kidney function, stopping the Lasix , increasing hydration, limiting sodium intake, and use of compression socks and elevating the lower extremities to manage edema.  Also informed them of the need for a repeat BMP in about a week.  Orders for BMP put in.  Lynwood verbalized understanding and repeated the information back.  All questions and concerns addressed at this time.

## 2024-01-17 NOTE — Addendum Note (Signed)
 Addended by: Alfreda Hammad A on: 01/17/2024 08:49 AM   Modules accepted: Orders

## 2024-01-21 NOTE — Telephone Encounter (Signed)
 Routing message to referral dpt and also back to myself

## 2024-01-21 NOTE — Telephone Encounter (Signed)
 We need another urinalysis please -thanks  Also will someone check on status/ reach out re: the referrals? Thanks

## 2024-01-23 ENCOUNTER — Ambulatory Visit: Payer: Self-pay | Admitting: Family Medicine

## 2024-01-23 ENCOUNTER — Encounter: Payer: Self-pay | Admitting: *Deleted

## 2024-01-23 ENCOUNTER — Other Ambulatory Visit

## 2024-01-23 DIAGNOSIS — R300 Dysuria: Secondary | ICD-10-CM | POA: Diagnosis not present

## 2024-01-23 DIAGNOSIS — R8290 Unspecified abnormal findings in urine: Secondary | ICD-10-CM | POA: Diagnosis not present

## 2024-01-23 LAB — POC URINALSYSI DIPSTICK (AUTOMATED)
Bilirubin, UA: NEGATIVE
Blood, UA: NEGATIVE
Glucose, UA: NEGATIVE
Ketones, UA: NEGATIVE
Nitrite, UA: POSITIVE — AB
Protein, UA: POSITIVE — AB
Spec Grav, UA: 1.015
Urobilinogen, UA: 0.2 U/dL
pH, UA: 6

## 2024-01-26 LAB — URINE CULTURE
MICRO NUMBER:: 17042250
SPECIMEN QUALITY:: ADEQUATE

## 2024-01-26 MED ORDER — CEPHALEXIN 500 MG PO CAPS: 500.0000 mg | ORAL_CAPSULE | Freq: Two times a day (BID) | ORAL | 0 refills | Status: DC

## 2024-01-28 ENCOUNTER — Telehealth: Payer: Self-pay | Admitting: Emergency Medicine

## 2024-01-28 ENCOUNTER — Other Ambulatory Visit
Admission: RE | Admit: 2024-01-28 | Discharge: 2024-01-28 | Disposition: A | Discharge status: Home / Self Care | Attending: Student | Admitting: Student

## 2024-01-28 DIAGNOSIS — Z79899 Other long term (current) drug therapy: Secondary | ICD-10-CM | POA: Diagnosis not present

## 2024-01-28 LAB — BASIC METABOLIC PANEL WITH GFR
Anion gap: 11 (ref 5–15)
BUN: 22 mg/dL (ref 8–23)
CO2: 24 mmol/L (ref 22–32)
Calcium: 9.1 mg/dL (ref 8.9–10.3)
Chloride: 103 mmol/L (ref 98–111)
Creatinine, Ser: 1.06 mg/dL — ABNORMAL HIGH (ref 0.44–1.00)
GFR, Estimated: 55 mL/min — ABNORMAL LOW (ref >60)
Glucose, Bld: 111 mg/dL — ABNORMAL HIGH (ref 70–99)
Potassium: 4 mmol/L (ref 3.5–5.1)
Sodium: 138 mmol/L (ref 135–145)

## 2024-01-29 ENCOUNTER — Other Ambulatory Visit: Payer: Self-pay | Admitting: Family Medicine

## 2024-01-30 NOTE — Telephone Encounter (Signed)
 Error

## 2024-02-11 ENCOUNTER — Telehealth: Payer: Self-pay | Admitting: Emergency Medicine

## 2024-02-11 DIAGNOSIS — Z79899 Other long term (current) drug therapy: Secondary | ICD-10-CM

## 2024-02-11 NOTE — Telephone Encounter (Signed)
-----   Message from Barnie Hila sent at 02/09/2024  6:51 AM EDT ----- Abby,   I saw the message that the patient still has lower extremity edema. We asked her not to take Lasix  because of her kidney function. Will you contact her and ask if she is weighing daily? Is she having difficult breathing, unable to lay flat without shortness of breath? If she is willing we can recheck BMP to see what her kidney function is doing and reconsider a short course of Lasix .   Thank you!  DW

## 2024-02-11 NOTE — Telephone Encounter (Signed)
 Called and left message for the patient to call back, call back number provided.

## 2024-02-11 NOTE — Telephone Encounter (Signed)
 Per DPR, spoke with the patient's husband when he returned my earlier call.  Inquired about patient's weight and edema.  Lynwood, husband of the patient, states that she has been having issues with the edema and her weight has been fluctuating between 217 and 221 lbs with the biggest jump of 4 lbs in one day.  Informed the patient that I will put an order in for BMP, per Deborah Wittneborn, NP and if the patient's kidney function allows, we will start her on a short course of Lasix .  Lynwood agreeable with this treatment plan.  States he will bring the patient by on Wednesday to get the blood work drawn.  Lynwood expressed gratitude for the phone call.  All questions and concerns addressed at this time. Order for BMP put in.

## 2024-02-13 DIAGNOSIS — Z79899 Other long term (current) drug therapy: Secondary | ICD-10-CM | POA: Diagnosis not present

## 2024-02-14 ENCOUNTER — Ambulatory Visit: Payer: Self-pay | Admitting: Student

## 2024-02-14 DIAGNOSIS — Z79899 Other long term (current) drug therapy: Secondary | ICD-10-CM

## 2024-02-14 DIAGNOSIS — R7989 Other specified abnormal findings of blood chemistry: Secondary | ICD-10-CM

## 2024-02-14 LAB — BASIC METABOLIC PANEL WITH GFR
BUN/Creatinine Ratio: 17 (ref 12–28)
BUN: 18 mg/dL (ref 8–27)
CO2: 25 mmol/L (ref 20–29)
Calcium: 9.7 mg/dL (ref 8.7–10.3)
Chloride: 102 mmol/L (ref 96–106)
Creatinine, Ser: 1.03 mg/dL — ABNORMAL HIGH (ref 0.57–1.00)
Glucose: 98 mg/dL (ref 70–99)
Potassium: 4.7 mmol/L (ref 3.5–5.2)
Sodium: 139 mmol/L (ref 134–144)
eGFR: 57 mL/min/1.73 — ABNORMAL LOW (ref >59)

## 2024-02-15 NOTE — Telephone Encounter (Signed)
 Patient was notified on 01/23/24 via MyChart the her referral was faxed to [facility] which also advised her to call them directly to schedule. She responded to my message acknowledging that she received and understood.  She can contact them directly to schedule if still not heard from them.   Her PT referral was sent to [facility] - they closed the referral due to not being able to reach the patient to schedule.   If the patient still wants the PT Referral, they can call and schedule with [facility] _ see referral for office information.

## 2024-02-27 ENCOUNTER — Other Ambulatory Visit: Payer: Self-pay | Admitting: Family Medicine

## 2024-02-28 NOTE — Telephone Encounter (Signed)
 I do not see medication on patients chart. Please advise.

## 2024-03-04 LAB — BASIC METABOLIC PANEL WITH GFR
BUN/Creatinine Ratio: 16 (ref 12–28)
BUN: 18 mg/dL (ref 8–27)
CO2: 23 mmol/L (ref 20–29)
Calcium: 9.7 mg/dL (ref 8.7–10.3)
Chloride: 101 mmol/L (ref 96–106)
Creatinine, Ser: 1.16 mg/dL — ABNORMAL HIGH (ref 0.57–1.00)
Glucose: 88 mg/dL (ref 70–99)
Potassium: 4.3 mmol/L (ref 3.5–5.2)
Sodium: 139 mmol/L (ref 134–144)
eGFR: 50 mL/min/1.73 — ABNORMAL LOW (ref >59)

## 2024-03-05 NOTE — Addendum Note (Signed)
 Addended by: TOBIE HOUSTON A on: 03/05/2024 09:01 AM   Modules accepted: Orders

## 2024-03-05 NOTE — Telephone Encounter (Signed)
 Called and left voicemail for patient and Lynwood, her husband.  Relayed the recommendation from Barnie Hila, NP about taking the Lasix  on a limited, as needed, basis based on weight gain of 3 lb in one day, or 5 lb in a week.  Also informed them to weigh patient every morning and the referral made to nephrology.  Phone number and office information for [employer] sent via [place].

## 2024-03-05 NOTE — Telephone Encounter (Signed)
-----   Message from Barnie Hila sent at 03/04/2024  7:32 AM EST ----- Please let patient know kidney function is a little low likely from the course of Lasix . Please ask patient how lower extremity edema is doing. Continue to hydrate. We can consider a referral to  nephrology at follow up or she can discuss this with her PCP.  Thank you!  DW  ----- Message ----- From: Rebecka Memos Lab Results In Sent: 03/04/2024   5:36 AM EST To: Barnie Hila, NP

## 2024-03-05 NOTE — Telephone Encounter (Signed)
-----   Message from Barnie Hila sent at 03/05/2024  8:25 AM EST ----- Erskin Schuller,   Ms. Puls can still take the Lasix  on a limited as needed basis for weight gain of 3 lb overnight or 5 lb in a week. She should weigh everyday first thing in the morning. Let's go ahead and refer  her to nephrology for elevated creatinine.   Thank you!  DW ----- Message ----- From: Tobie Mac LABOR, RN Sent: 03/05/2024   8:21 AM EST To: Barnie Hila, NP  ----- Message from Mac LABOR Tobie, RN sent at 03/05/2024  8:21 AM EST -----   ----- Message ----- From: Hila Barnie, NP Sent: 03/04/2024   7:32 AM EST To: Mac LABOR Tobie, RN  Please let patient know kidney function is a little low likely from the course of Lasix . Please ask patient how lower extremity edema is doing. Continue to hydrate. We can consider a referral to  nephrology at follow up or she can discuss this with her PCP.  Thank you!  DW  ----- Message ----- From: Rebecka Memos Lab Results In Sent: 03/04/2024   5:36 AM EST To: Barnie Hila, NP

## 2024-03-05 NOTE — Telephone Encounter (Signed)
 Called and spoke with Lynwood, patient's husband, per DPR, and relayed the most recent lab results as interpreted by Barnie Hila, NP.  Asked how the edema has been and Lynwood stated that while she was on Lasix , the swelling had gone down, but now its starting to come back.  No other symptoms reported.  Informed him that she may need to be referred to nephrology.  Will call and let them know when have confirmation from Barnie Hila, NP.  Lynwood verbalized understanding, expressed gratitude for the call, all questions and concerns addressed at this time.

## 2024-03-06 NOTE — Progress Notes (Signed)
 Per DPR, spoke with the patient's husband when he returned my earlier call.  Inquired about patient's weight and edema.  Sharon [name], husband of the patient, states that she has been having issues with the edema and her weight has been fluctuating between 217 and 221 lbs with the biggest jump of 4 lbs in one day.  Informed the patient that I will put an order in for BMP, per Deborah Wittneborn, NP and if the patient's kidney function allows, we will start her on a short course of Lasix .  Sharon [name] agreeable with this treatment plan.  States he will bring the patient by on Wednesday to get the blood work drawn.  Sharon [name] expressed gratitude for the phone call.  All questions and concerns addressed at this time. Order for BMP put in.

## 2024-03-10 ENCOUNTER — Other Ambulatory Visit: Payer: Self-pay | Admitting: Family Medicine

## 2024-03-11 NOTE — Telephone Encounter (Signed)
 Before I refill please update me re; status of cough -new or old  Thanks

## 2024-03-11 NOTE — Telephone Encounter (Signed)
 Last OV was an acute appt on 12/18/23  Med not on med list but last filled on 09/04/23 #118 mL/ 0 refills

## 2024-03-13 NOTE — Telephone Encounter (Signed)
 I wanted to get an update on her cough situation before filling, thanks

## 2024-03-13 NOTE — Telephone Encounter (Signed)
 Husband said this is a auto refill that was sent in error he told pharmacy to take off of her list. Since pt got her trach removed pt hasn't had any cough or congestion she may need an occasional cough drop but that's it. Med declined and spouse/ pt aware

## 2024-03-18 ENCOUNTER — Ambulatory Visit: Admitting: Student in an Organized Health Care Education/Training Program

## 2024-03-31 ENCOUNTER — Telehealth: Payer: Self-pay | Admitting: Oncology

## 2024-03-31 NOTE — Telephone Encounter (Signed)
 Pt spouse called to confirm appt date/time - confirmed w/pt spouse about date/time - LH

## 2024-04-05 NOTE — Progress Notes (Deleted)
 Cardiology Clinic Note   Date: 04/05/2024 ID: Sharon [name], DOB December 25, 1950, MRN [id number]  Primary Cardiologist:  Evalene Lunger, MD  Chief Complaint   Sharon [name] is a 73 y.o. female who presents to the clinic today for ***  Patient Profile   Sharon [name] is followed by *** for the history outlined below.      Past medical history significant for: Palpitations/SVT. 14-day ZIO 05/28/2023: HR 48 to 145 bpm, average 73 bpm.  Predominantly sinus rhythm.  6 runs of SVT fastest 5 beats max rate 145 bpm, longest 14 beats average rate 118 bpm.  Rare ectopy.  No A-fib noted. Chronic diastolic heart failure. Echo 06/25/2023: EF 60 to 65%.  No RWMA.  Mild LVH.  Grade I DD.  Normal RV size/function.  Aortic valve sclerosis/calcification without stenosis. Hypertension. Hyperlipidemia. Lipid panel 01/02/2023: LDL 46, HDL 61, TG 241, total 144. GERD. Hypothyroidism. Angioedema/alpha gal.  In summary, patient was first evaluated by Dr. Gollan on 09/21/2020 for lightheadedness and presyncope at the request of Dr. Randeen.  Patient reported history of mitral valve prolapse in the 1980s.  She reported episodes of gait instability and lightheadedness.  No documented orthostasis.  It was felt her symptoms were due to general deconditioning gait instability.  Cardiac testing was deferred in favor of beginning a regular walking program.  She was seen in follow-up August 2023 for complaint of palpitations.  She wore a 14-day ZIO which showed a 2 runs of SVT as detailed above.  Echo showed normal LV/RV function as detailed above.  She was started on Lopressor  and referred to EP.  Patient was last seen in the office by Dr. Gollan on 05/08/2022 for 21-month follow-up.  She continued to complain of lightheadedness and neurology referral was recommended.  Patient's husband reported lightheadedness worse after intubation with general anesthesia for 4 days.  She did not want to use cane for stability.   Walking program and physical therapy for gait stability was recommended.  Zetia  was added for hyperlipidemia.  Patient underwent hospital admission to [facility] from 02/01/2023 to 02/09/2023 for recurrent angioedema.  She reported gradual tongue swelling and shortness of breath unresponsive to IM epinephrine .  She was taken emergently to the OR for transnasal endotracheal intubation.  She underwent tracheostomy on 02/06/2023.  She was discharged to [facility] on 02/09/2023.  Patient was evaluated in the ED on 05/12/2023 for trach tube dislodgment.  Respiratory therapy cleaned and adjusted tracheostomy with improvement in symptoms.  She was treated for a UTI and discharged follow-up with PCP.  Patient presented to [facility] ED on 05/22/2023 with tongue swelling.  She was treated with steroids and epinephrine  with complete resolution of symptoms.  She was discharged home the same day.   Patient was seen in the office on 05/28/2023 for evaluation of tachycardia.  Patient reported palpitations described as heart racing and skipping for years but became concerned when she read in her chart that she has heart failure.  Heart rate stays elevated most of the time generally > 100 bpm with dips into the 90s.  She finds it most bothersome at night when she is trying to sleep.  She reported associated dyspnea.  During her recent hospital admission she had an EKG that demonstrated A-fib.  She was started on Eliquis  and carvedilol  and 14-day ZIO was ordered.  Unfortunately after starting Eliquis  she developed tongue swelling and diarrhea and was treated in the ED.  Given her reaction she was instructed to stop  Eliquis  and return to prior dose of metoprolol  tartrate.  Patient treated in the ED on 06/08/2023 for allergic reaction.  On 06/26/2023 she underwent revision of her tracheostomy.   Patient was last seen in the office by me on 01/16/2024 for routine follow-up she reported increased lower extremity edema that started at the beginning of  the summer.  Edema did not improve overnight or with compression.  She denied increased shortness of breath, DOE and denied orthopnea or PND.  She had not had an allergic reaction in several months.  Plan was for patient to start Lasix  however BMP demonstrated low kidney function with a creatinine of 1.72.  She was instructed to not take Lasix  and manage edema conservatively.  Repeat BMP 2 weeks later demonstrated improved kidney function with creatinine 1.06.  She was started on a short course of Lasix .  Repeat BMP demonstrated slight increase in creatinine.     History of Present Illness    Today, patient ***  Palpitations/SVT 14-day ZIO February 2025 showed 6 runs of SVT, no A-fib noted.  Patient denies palpitations.*** -Continue metoprolol .   Chronic diastolic heart failure/lower extremity edema Echo March 2025 showed normal LV/RV function, mild LVH, Grade I DD, no significant valvular abnormalities.  Patient*** - Continue hydralazine , metoprolol .***   Hypertension BP today***. She has chronic lightheadedness and dizziness.  -Continue hydralazine , metoprolol .  ROS: All other systems reviewed and are otherwise negative except as noted in History of Present Illness.  EKGs/Labs Reviewed        07/26/2023: ALT 5; AST 13 03/03/2024: BUN 18; Creatinine, Ser 1.16; Potassium 4.3; Sodium 139   01/07/2024: Hemoglobin 13.9; WBC Count 7.6   05/15/2023: TSH 2.39   No results found for requested labs within last 365 days.  ***  Risk Assessment/Calculations    {Does this patient have ATRIAL FIBRILLATION?:[id number]} No BP recorded.  {Refresh Note OR Click here to enter BP  :1}***        Physical Exam    VS:  There were no vitals taken for this visit. , BMI There is no height or weight on file to calculate BMI.  GEN: Well nourished, well developed, in no acute distress. Neck: No JVD or carotid bruits. Cardiac: *** RRR. *** No murmur. No rubs or gallops.   Respiratory:  Respirations  regular and unlabored. Clear to auscultation without rales, wheezing or rhonchi. GI: Soft, nontender, nondistended. Extremities: Radials/DP/PT 2+ and equal bilaterally. No clubbing or cyanosis. No edema ***  Skin: Warm and dry, no rash. Neuro: Strength intact.  Assessment & Plan   ***  Disposition: ***     {Are you ordering a CV Procedure (e.g. stress test, cath, DCCV, TEE, etc)?   Press F2        :[id number]}   Signed, Barnie HERO. Daemian Gahm, DNP, NP-C

## 2024-04-07 ENCOUNTER — Ambulatory Visit: Admitting: Student

## 2024-04-09 ENCOUNTER — Inpatient Hospital Stay: Attending: Oncology

## 2024-04-09 ENCOUNTER — Encounter: Payer: Self-pay | Admitting: Nurse Practitioner

## 2024-04-09 ENCOUNTER — Ambulatory Visit: Admitting: Nurse Practitioner

## 2024-04-09 VITALS — BP 156/88 | HR 66 | Temp 98.1°F | Resp 16

## 2024-04-09 DIAGNOSIS — D509 Iron deficiency anemia, unspecified: Secondary | ICD-10-CM

## 2024-04-09 DIAGNOSIS — D508 Other iron deficiency anemias: Secondary | ICD-10-CM

## 2024-04-09 LAB — IRON AND TIBC
Iron: 77 ug/dL (ref 28–170)
Saturation Ratios: 23 % (ref 10.4–31.8)
TIBC: 337 ug/dL (ref 250–450)
UIBC: 261 ug/dL

## 2024-04-09 LAB — CBC (CANCER CENTER ONLY)
HCT: 40.8 % (ref 36.0–46.0)
Hemoglobin: 12.9 g/dL (ref 12.0–15.0)
MCH: 30.8 pg (ref 26.0–34.0)
MCHC: 31.6 g/dL (ref 30.0–36.0)
MCV: 97.4 fL (ref 80.0–100.0)
Platelet Count: 259 K/uL (ref 150–400)
RBC: 4.19 MIL/uL (ref 3.87–5.11)
RDW: 12.8 % (ref 11.5–15.5)
WBC Count: 8.6 K/uL (ref 4.0–10.5)
nRBC: 0 % (ref 0.0–0.2)

## 2024-04-09 LAB — FERRITIN: Ferritin: 149 ng/mL (ref 11–307)

## 2024-04-09 NOTE — Progress Notes (Unsigned)
 Hematology/Oncology Consult note [facility]  [phone] [phone]  Patient Care Team: Tower, Laine LABOR, MD as PCP - General (Family Medicine) Perla Evalene PARAS, MD as PCP - Cardiology (Cardiology) Melanee Annah BROCKS, MD as Consulting Physician (Oncology)   Name of the patient: Sharon [name]  [id number]  02-23-1951   Date of visit: 04/10/2024  Diagnosis-iron  deficiency anemia  Chief complaint/ Reason for visit-routine follow up iron  deficiency anemia  Heme/Onc history: patient is a 73 year old female with a past medical history significant for hypertension hyperlipidemia hypothyroidism, history of CHF Who has been referred for iron  deficiency anemia.  Labs from 07/26/2023 showed white count of 7.4, H&H of 7.7/25 with an MCV of 72.8 and a platelet count of 153.  Ferritin levels were low at 5.1.  Last colonoscopy was 10 years ago which shows diverticulosis in the sigmoid colon.  B12 levels from January 2025 normal at 434.  She denies any blood loss in her stool or urine.  Denies any dark melanotic stools.  Denies any vaginal bleeding.  She has been taking oral iron  without any side effects.   Patient has had history of anaphylaxis and throat swelling to multiple medications and she is still getting workup for the same.  Because of multiple hospitalizations she has required a tracheostomy to protect her airway.  However no longer has trach.   She tolerated Venofer  without any significant side effects  Interval history-she reports improvement in her energy levels and overall feeling well.  No concerns or complaints this visit.  ECOG PS- 1 Pain scale- 0   Review of systems- Review of Systems  Constitutional:  Negative for chills, fever, malaise/fatigue and weight loss.  HENT:  Negative for congestion, ear discharge and nosebleeds.   Eyes:  Negative for blurred vision.  Respiratory:  Negative for cough, hemoptysis, sputum production, shortness  of breath and wheezing.   Cardiovascular:  Negative for chest pain, palpitations, orthopnea and claudication.  Gastrointestinal:  Negative for abdominal pain, blood in stool, constipation, diarrhea, heartburn, melena, nausea and vomiting.  Genitourinary:  Negative for dysuria, flank pain, frequency, hematuria and urgency.  Musculoskeletal:  Negative for back pain, joint pain and myalgias.  Skin:  Negative for rash.  Neurological:  Negative for dizziness, tingling, focal weakness, seizures, weakness and headaches.  Endo/Heme/Allergies:  Does not bruise/bleed easily.  Psychiatric/Behavioral:  Negative for depression and suicidal ideas. The patient does not have insomnia.       Allergies  Allergen Reactions   Cozaar  [Losartan ] Swelling   Eliquis  [Apixaban ] Swelling   Gelatin Anaphylaxis   Septra  [Sulfamethoxazole -Trimethoprim ] Anaphylaxis   Zinc Gelatin [Zinc] Anaphylaxis   Alpha-Gal    Amlodipine Besylate     REACTION: edema   Atorvastatin     REACTION: muscle pain   Beef Allergy    Cat Dander    Dust Mite Extract    Neomycin-Bacitracin Zn-Polymyx     REACTION: rash Per pt not sure if from meds or not   Latex      Past Medical History:  Diagnosis Date   Angio-edema    Anxiety    Cataract    CHF (congestive heart failure) (HCC)    Chronic heart failure with preserved ejection fraction (HCC)    Essential hypertension    GERD (gastroesophageal reflux disease)    Hyperlipidemia    Hypothyroidism    SVT (supraventricular tachycardia)    Tracheostomy in place [facility]) 01/2023   Urinary incontinence    Vitamin B  12 deficiency    Vitamin D  deficiency      Past Surgical History:  Procedure Laterality Date   CATARACT EXTRACTION W/ INTRAOCULAR LENS IMPLANT Right 12/29/2016   Dr. Evalene Raw   CATARACT EXTRACTION W/ INTRAOCULAR LENS IMPLANT Left 01/15/2017   Dr. Evalene Bevis   COLONOSCOPY     INTUBATION-ENDOTRACHEAL WITH TRACHEOSTOMY STANDBY N/A 02/01/2023   Procedure:  INTUBATION-ENDOTRACHEAL WITH TRACHEOSTOMY STANDBY;  Surgeon: Herminio Miu, MD;  Location: [facility] ORS;  Service: ENT;  Laterality: N/A;   LEEP     PEG TUBE PLACEMENT  01/2023   PEG TUBE REMOVAL  04/2023   TRACHEOSTOMY TUBE PLACEMENT N/A 02/06/2023   Procedure: TRACHEOSTOMY;  Surgeon: Milissa Hamming, MD;  Location: [facility] ORS;  Service: ENT;  Laterality: N/A;   TRACHEOSTOMY TUBE PLACEMENT N/A 06/26/2023   Procedure: TRACHEOSTOMY REVISON COMPLEX;  Surgeon: Herminio Miu, MD;  Location: [facility] ORS;  Service: ENT;  Laterality: N/A;   TUBAL LIGATION     VEIN LIGATION AND STRIPPING     x2    Social History   Socioeconomic History   Marital status: Married    Spouse name: Lynwood   Number of children: 2   Years of education: Not on file   Highest education level: Not on file  Occupational History   Not on file  Tobacco Use   Smoking status: Former    Current packs/day: 0.00    Types: Cigarettes    Quit date: 04/24/1996    Years since quitting: 27.9   Smokeless tobacco: Never  Vaping Use   Vaping status: Never Used  Substance and Sexual Activity   Alcohol use: Yes    Alcohol/week: 4.0 standard drinks of alcohol    Types: 4 Shots of liquor per week    Comment: occasional   Drug use: No   Sexual activity: Not Currently  Other Topics Concern   Not on file  Social History Narrative   Not on file   Social Drivers of Health   Tobacco Use: Medium Risk (04/09/2024)   Patient History    Smoking Tobacco Use: Former    Smokeless Tobacco Use: Never    Passive Exposure: Not on file  Financial Resource Strain: Low Risk (02/21/2022)   Overall Financial Resource Strain (CARDIA)    Difficulty of Paying Living Expenses: Not hard at all  Food Insecurity: No Food Insecurity (08/07/2023)   Hunger Vital Sign    Worried About Running Out of Food in the Last Year: Never true    Ran Out of Food in the Last Year: Never true  Transportation Needs: No Transportation Needs (08/07/2023)   PRAPARE -  Administrator, Civil Service (Medical): No    Lack of Transportation (Non-Medical): No  Physical Activity: Insufficiently Active (02/21/2022)   Exercise Vital Sign    Days of Exercise per Week: 5 days    Minutes of Exercise per Session: 10 min  Stress: No Stress Concern Present (02/21/2022)   [employer] of Occupational Health - Occupational Stress Questionnaire    Feeling of Stress : Not at all  Social Connections: Patient Unable To Answer (06/26/2023)   Social Connection and Isolation Panel    Frequency of Communication with Friends and Family: Patient unable to answer    Frequency of Social Gatherings with Friends and Family: Patient unable to answer    Attends Religious Services: Patient unable to answer    Active Member of Clubs or Organizations: Patient unable to answer    Attends Club  or Organization Meetings: Patient unable to answer    Marital Status: Patient unable to answer  Intimate Partner Violence: Not At Risk (08/07/2023)   Humiliation, Afraid, Rape, and Kick questionnaire    Fear of Current or Ex-Partner: No    Emotionally Abused: No    Physically Abused: No    Sexually Abused: No  Depression (PHQ2-9): Low Risk (04/09/2024)   Depression (PHQ2-9)    PHQ-2 Score: 0  Alcohol Screen: Low Risk (02/21/2022)   Alcohol Screen    Last Alcohol Screening Score (AUDIT): 6  Housing: Low Risk (08/07/2023)   Housing Stability Vital Sign    Unable to Pay for Housing in the Last Year: No    Number of Times Moved in the Last Year: 0    Homeless in the Last Year: No  Utilities: Not At Risk (08/07/2023)   AHC Utilities    Threatened with loss of utilities: No  Health Literacy: Not on file    Family History  Problem Relation Age of Onset   Transient ischemic attack Mother    Hypertension Mother    Hypertension Father    Colon cancer Neg Hx    Esophageal cancer Neg Hx    Rectal cancer Neg Hx    Stomach cancer Neg Hx      Current Outpatient Medications:     cephALEXin  (KEFLEX ) 500 MG capsule, Take 1 capsule (500 mg total) by mouth 2 (two) times daily., Disp: 14 capsule, Rfl: 0   EPINEPHrine  (NEFFY ) 2 MG/0.1ML SOLN, Place 2 mg into the nose as needed., Disp: , Rfl:    famotidine  (PEPCID ) 20 MG tablet, Take 1 tablet (20 mg total) by mouth 2 (two) times daily., Disp: 180 tablet, Rfl: 2   fexofenadine (ALLEGRA) 180 MG tablet, Take 360 mg by mouth 2 (two) times daily. (Patient taking differently: Take 360 mg by mouth 2 (two) times daily. 180 mg in the AM, 360 in the pm), Disp: , Rfl:    fluticasone  (FLONASE ) 50 MCG/ACT nasal spray, USE 2 SPRAYS IN BOTH NOSTRILS  DAILY, Disp: 48 g, Rfl: 1   furosemide  (LASIX ) 20 MG tablet, Take 1 tablet (20 mg total) by mouth daily as needed. For weight gain of 3 lbs or more in one day OR of 5 lbs or more in one week, Disp: 90 tablet, Rfl: 3   hydrALAZINE  (APRESOLINE ) 25 MG tablet, TAKE 1 TABLET BY MOUTH EVERY 8 HOURS., Disp: 270 tablet, Rfl: 1   ipratropium-albuterol  (DUONEB) 0.5-2.5 (3) MG/3ML SOLN, Take 3 mLs by nebulization every 4 (four) hours as needed., Disp: 120 mL, Rfl: 1   iron  polysaccharides (NIFEREX) 150 MG capsule, Take 1 capsule (150 mg total) by mouth 2 (two) times daily., Disp: 60 capsule, Rfl: 1   metoprolol  tartrate (LOPRESSOR ) 25 MG tablet, TAKE 1 TABLET BY MOUTH TWICE A DAY, Disp: 180 tablet, Rfl: 3   nitrofurantoin , macrocrystal-monohydrate, (MACROBID ) 100 MG capsule, Take 1 capsule (100 mg total) by mouth 2 (two) times daily., Disp: 14 capsule, Rfl: 0   pantoprazole  (PROTONIX ) 40 MG tablet, Take 1 tablet (40 mg total) by mouth daily. In am at least 30 minutes before food/drink or medicines, Disp: 90 tablet, Rfl: 3   rosuvastatin  (CRESTOR ) 20 MG tablet, TAKE 1 TABLET BY MOUTH DAILY, Disp: 90 tablet, Rfl: 1   tiZANidine  (ZANAFLEX ) 4 MG tablet, Take 0.5-1 tablets (2-4 mg total) by mouth every 8 (eight) hours as needed for muscle spasms (back pain/ spasm). Caution of sedation, Disp: 20 tablet, Rfl: 0  vitamin B-12 (CYANOCOBALAMIN ) 500 MCG tablet, Take 500 mcg by mouth daily., Disp: , Rfl:    EPINEPHrine  0.3 mg/0.3 mL IJ SOAJ injection, Inject 0.3 mg into the muscle as needed. (Patient not taking: Reported on 04/09/2024), Disp: , Rfl:    furosemide  (LASIX ) 20 MG tablet, Take 1 tablet (20 mg total) by mouth daily for 5 days. (Patient not taking: Reported on 04/09/2024), Disp: 5 tablet, Rfl: 0  Physical exam:  Vitals:   04/09/24 0955 04/09/24 0959  BP: (!) 171/90 (!) 156/88  Pulse: 66   Resp: 16   Temp: 98.1 F (36.7 C)   TempSrc: Oral   SpO2: 99%    Physical Exam HENT:     Mouth/Throat:     Comments: Tracheostomy in place Cardiovascular:     Rate and Rhythm: Normal rate and regular rhythm.     Heart sounds: Normal heart sounds.  Pulmonary:     Effort: Pulmonary effort is normal.     Breath sounds: Normal breath sounds.  Abdominal:     General: Bowel sounds are normal.     Palpations: Abdomen is soft.  Skin:    General: Skin is warm and dry.  Neurological:     Mental Status: She is alert and oriented to person, place, and time.      I have personally reviewed labs listed below:    Latest Ref Rng & Units 03/03/2024    9:47 AM  CMP  Glucose 70 - 99 mg/dL 88   BUN 8 - 27 mg/dL 18   Creatinine 9.42 - 1.00 mg/dL 8.83   Sodium 865 - 855 mmol/L 139   Potassium 3.5 - 5.2 mmol/L 4.3   Chloride 96 - 106 mmol/L 101   CO2 20 - 29 mmol/L 23   Calcium  8.7 - 10.3 mg/dL 9.7       Latest Ref Rng & Units 04/09/2024    9:40 AM  CBC  WBC 4.0 - 10.5 K/uL 8.6   Hemoglobin 12.0 - 15.0 g/dL 87.0   Hematocrit 63.9 - 46.0 % 40.8   Platelets 150 - 400 K/uL 259    Lab Results  Component Value Date   IRON  77 04/09/2024   TIBC 337 04/09/2024   FERRITIN 149 04/09/2024    I have personally reviewed Radiology images listed below: No images are attached to the encounter.  No results found.    Assessment and plan- Patient is a 73 y.o. female here for a routine Follow-up of iron   deficiency anemia  Patient has not required IV iron  since July.  Today her Hg is 12.9, Iron  77, ferritin 149.  She reports feeling well, good energy, no dizziness, denies seeing blood in urine,sputum, or stool.  No need for venofer  today.  We will repeat labs in 3 months to closely monitor Hg trends with her following up with MD/NP in 6 mths for repeat labs and possible venofer .    Follow up plan: F/U in 3 months lab only cbc, ferritin, iron  and TIBC F/U in 6 months see MD/NP cbc, ferritin, iron  and tibc possible venofer  LP    Morna Husband AGNP-C [facility] [phone] 04/10/2024 10:59 AM

## 2024-04-10 ENCOUNTER — Encounter: Payer: Self-pay | Admitting: Oncology

## 2024-04-23 NOTE — Progress Notes (Signed)
 "  Cardiology Clinic Note   Date: 04/28/2024 ID: LATASHIA KOCH, DOB [birth year], MRN [id number]  Primary Cardiologist:  Evalene Lunger, MD  Chief Complaint   IVORI STORR is a 73 y.o. female who presents to the clinic today for routine follow up.   Patient Profile   COREE RIESTER is followed by Dr. Gollan for the history outlined below.      Past medical history significant for: Palpitations/SVT. 14-day ZIO 05/28/2023: HR 48 to 145 bpm, average 73 bpm.  Predominantly sinus rhythm.  6 runs of SVT fastest 5 beats max rate 145 bpm, longest 14 beats average rate 118 bpm.  Rare ectopy.  No A-fib noted. Chronic diastolic heart failure. Echo 06/25/2023: EF 60 to 65%.  No RWMA.  Mild LVH.  Grade I DD.  Normal RV size/function.  Aortic valve sclerosis/calcification without stenosis. Hypertension. Hyperlipidemia. Lipid panel 01/02/2023: LDL 46, HDL 61, TG 241, total 144. GERD. Hypothyroidism. Angioedema/alpha gal.  In summary, patient was first evaluated by Dr. Gollan on 09/21/2020 for lightheadedness and presyncope at the request of Dr. Randeen.  Patient reported history of mitral valve prolapse in the 1980s.  She reported episodes of gait instability and lightheadedness.  No documented orthostasis.  It was felt her symptoms were due to general deconditioning gait instability.  Cardiac testing was deferred in favor of beginning a regular walking program.  She was seen in follow-up August 2023 for complaint of palpitations.  She wore a 14-day ZIO which showed a 2 runs of SVT as detailed above.  Echo showed normal LV/RV function as detailed above.  She was started on Lopressor  and referred to EP.  Patient was last seen in the office by Dr. Gollan on 05/08/2022 for 28-month follow-up.  She continued to complain of lightheadedness and neurology referral was recommended.  Patient's husband reported lightheadedness worse after intubation with general anesthesia for 4 days.  She did not want to use cane  for stability.  Walking program and physical therapy for gait stability was recommended.  Zetia  was added for hyperlipidemia.  Patient underwent hospital admission to [facility] from 02/01/2023 to 02/09/2023 for recurrent angioedema.  She reported gradual tongue swelling and shortness of breath unresponsive to IM epinephrine .  She was taken emergently to the OR for transnasal endotracheal intubation.  She underwent tracheostomy on 02/06/2023.  She was discharged to LT[facility] on 02/09/2023.  Patient was evaluated in the ED on 05/12/2023 for trach tube dislodgment.  Respiratory therapy cleaned and adjusted tracheostomy with improvement in symptoms.  She was treated for a UTI and discharged follow-up with PCP.  Patient presented to [facility] ED on 05/22/2023 with tongue swelling.  She was treated with steroids and epinephrine  with complete resolution of symptoms.  She was discharged home the same day.   Patient was seen in the office on 05/28/2023 for evaluation of tachycardia.  Patient reported palpitations described as heart racing and skipping for years but became concerned when she read in her chart that she has heart failure.  Heart rate stays elevated most of the time generally > 100 bpm with dips into the 90s.  She finds it most bothersome at night when she is trying to sleep.  She reported associated dyspnea.  During her recent hospital admission she had an EKG that demonstrated A-fib.  She was started on Eliquis  and carvedilol  and 14-day ZIO was ordered.  Unfortunately after starting Eliquis  she developed tongue swelling and diarrhea and was treated in the ED.  Given her reaction  she was instructed to stop Eliquis  and return to prior dose of metoprolol  tartrate.  Patient treated in the ED on 06/08/2023 for allergic reaction.  On 06/26/2023 she underwent revision of her tracheostomy.   Patient was last seen in the office by me on 01/16/2024 for routine follow-up she reported increased lower extremity edema that started at  the beginning of the summer.  Edema did not improve overnight or with compression.  She denied increased shortness of breath, DOE and denied orthopnea or PND.  She had not had an allergic reaction in several months.  Plan was for patient to start Lasix  however BMP demonstrated low kidney function with a creatinine of 1.72.  She was instructed to not take Lasix  and manage edema conservatively.  Repeat BMP 2 weeks later demonstrated improved kidney function with creatinine 1.06.  She was started on a short course of Lasix .  Repeat BMP demonstrated slight increase in creatinine.     History of Present Illness    Today, patient is accompanied by her husband. She made the decision to remove her trach about 2 months ago and is doing very well. She had previously dealt with a productive cough. Since taking it out the cough has resolved. She continues to have lower extremity edema. Her last Lasix  dose was about 2 weeks ago. She is not sure if it really makes a difference in the swelling. She is not weighing daily. She reports a long history of bad veins with multiple vein procedures starting at age 22. She has not tried compression socks, as the pair they ordered was too small. Her husband has reordered a bigger size. She reports palpitations occurring 1-2 times a week described as constant fluttering and lasting a full day. She continues to have positional lightheadedness described as feeling woozy particularly with sit to stand. However, she can also have episodes of wooziness that lasts all day.     ROS: All other systems reviewed and are otherwise negative except as noted in History of Present Illness.  EKGs/Labs Reviewed       EKG is not performed today.   07/26/2023: ALT 5; AST 13 03/03/2024: BUN 18; Creatinine, Ser 1.16; Potassium 4.3; Sodium 139   04/09/2024: Hemoglobin 12.9; WBC Count 8.6   05/15/2023: TSH 2.39   Risk Assessment/Calculations      HYPERTENSION CONTROL Vitals:   04/28/24  1040 04/28/24 1217  BP: (!) 160/100 (!) 150/90    The patient's blood pressure is elevated above target today.  In order to address the patient's elevated BP: Blood pressure will be monitored at home to determine if medication changes need to be made.           Physical Exam    VS:  BP (!) 150/90 (BP Location: Left Arm, Patient Position: Sitting, Cuff Size: Large)   Pulse 70   Ht 5' 6 (1.676 m)   Wt 229 lb 6.4 oz (104.1 kg)   SpO2 97%   BMI 37.03 kg/m  , BMI Body mass index is 37.03 kg/m.  GEN: Well nourished, well developed, in no acute distress. Neck: No JVD or carotid bruits. Cardiac:  RRR.  No murmur. No rubs or gallops.   Respiratory:  Respirations regular and unlabored. Clear to auscultation without rales, wheezing or rhonchi. GI: Soft, nontender, nondistended. Extremities: Radials/DP/PT 2+ and equal bilaterally. No clubbing or cyanosis. Mild, nonpitting edema bilateral lower extremities more pronounced in R>L ankles.   Skin: Warm and dry, no rash. Neuro: Strength intact.  Assessment & Plan   Palpitations/SVT 14-day ZIO February 2025 showed 6 runs of SVT, no A-fib noted.  Patient reports palpitations described as constant fluttering occurring 1-2 times a week and lasting all day. Discussed taking an extra 1/2 tablet of metoprolol  up to 2 times a day when this occurs. RRR on exam today.  - Take an extra 1/2 tablet of metoprolol  up to 2 times a day for increased palpitations.  - Continue metoprolol .   Chronic diastolic heart failure/lower extremity edema Echo March 2025 showed normal LV/RV function, mild LVH, Grade I DD, no significant valvular abnormalities.  Patient has been without her trach for 2 months. She continues to have lower extremity edema. Last dose of Lasix  was about 2 weeks ago. She is not weighing daily. She has not tried compression, as the socks she got were too small and she has not received a bigger pair yet. She denies orthopnea or PND. She reports a  long history of bad veins with multiple vein procedures starting at age 54. Mild, non-pitting edema bilateral lower extremities greater in R>L ankles. Lungs clear to auscultation.  - Continue to manage edema with as needed Lasix , compression, elevation and sodium restriction.  - Continue hydralazine , metoprolol .   Hypertension/orthostasis BP today 160/100 on intake and 150/90 on my recheck. She does not check BP at home. She has chronic lightheadedness and dizziness.  - Discussed slow position changes and compression.  - MyChart 2 weeks of BP readings. BP log provided.  - Continue hydralazine , metoprolol . Would consider increasing hydralazine  if BP not well controlled at home.   Disposition: MyChart 2 weeks of BP readings. Return in 6 months or sooner as needed.          Signed, Barnie HERO. Nyilah Kight, DNP, NP-C  "

## 2024-04-28 ENCOUNTER — Encounter: Payer: Self-pay | Admitting: Student

## 2024-04-28 ENCOUNTER — Ambulatory Visit: Attending: Student | Admitting: Student

## 2024-04-28 VITALS — BP 150/90 | HR 70 | Ht 66.0 in | Wt 229.4 lb

## 2024-04-28 DIAGNOSIS — R42 Dizziness and giddiness: Secondary | ICD-10-CM | POA: Diagnosis not present

## 2024-04-28 DIAGNOSIS — I471 Supraventricular tachycardia, unspecified: Secondary | ICD-10-CM

## 2024-04-28 DIAGNOSIS — I5032 Chronic diastolic (congestive) heart failure: Secondary | ICD-10-CM

## 2024-04-28 DIAGNOSIS — R600 Localized edema: Secondary | ICD-10-CM | POA: Diagnosis not present

## 2024-04-28 DIAGNOSIS — I10 Essential (primary) hypertension: Secondary | ICD-10-CM | POA: Diagnosis not present

## 2024-04-28 DIAGNOSIS — R002 Palpitations: Secondary | ICD-10-CM | POA: Diagnosis not present

## 2024-04-28 NOTE — Patient Instructions (Signed)
 Medication Instructions:  Your physician recommends that you continue on your current medications as directed. Please refer to the Current Medication list given to you today.   *If you need a refill on your cardiac medications before your next appointment, please call your pharmacy*  Lab Work: No labs ordered today  If you have labs (blood work) drawn today and your tests are completely normal, you will receive your results only by: MyChart Message (if you have MyChart) OR A paper copy in the mail If you have any lab test that is abnormal or we need to change your treatment, we will call you to review the results.  Testing/Procedures: No test ordered today   Follow-Up: At [facility], you and your health needs are our priority.  As part of our continuing mission to provide you with exceptional heart care, our providers are all part of one team.  This team includes your primary Cardiologist (physician) and Advanced Practice Providers or APPs (Physician Assistants and Nurse Practitioners) who all work together to provide you with the care you need, when you need it.  Your next appointment:   6 month(s)  Provider:   Barnie Hila, NP    We recommend signing up for the patient portal called MyChart.  Sign up information is provided on this After Visit Summary.  MyChart is used to connect with patients for Virtual Visits (Telemedicine).  Patients are able to view lab/test results, encounter notes, upcoming appointments, etc.  Non-urgent messages can be sent to your provider as well.   To learn more about what you can do with MyChart, go to [email or web address].

## 2024-05-27 ENCOUNTER — Telehealth: Payer: Self-pay | Admitting: Family Medicine

## 2024-05-27 NOTE — Telephone Encounter (Signed)
 Form faxed with med list and problem list attached

## 2024-05-27 NOTE — Telephone Encounter (Signed)
Form reprinted and placed in PCP's inbox.

## 2024-05-27 NOTE — Telephone Encounter (Signed)
 Form in IN box Please att snapshot (med list /probs) or what accomplishes same  Thanks

## 2024-05-27 NOTE — Telephone Encounter (Signed)
 Copied from CRM #[id number]. Topic: General - Other >> May 27, 2024 11:15 AM Alfonso HERO wrote: Reason for CRM: [facility] calling for status of a clearance form that was faxed over 1/14. They will be refaxing it over for it to be completed and faxed back asap.

## 2024-07-08 ENCOUNTER — Inpatient Hospital Stay

## 2024-10-08 ENCOUNTER — Inpatient Hospital Stay: Admitting: Oncology

## 2024-10-08 ENCOUNTER — Inpatient Hospital Stay
